# Patient Record
Sex: Male | Born: 1935
Health system: Southern US, Community
[De-identification: ages and names within clinical notes are randomized; demographics above are authoritative.]

## PROBLEM LIST (undated history)

## (undated) DIAGNOSIS — S9304XA Dislocation of right ankle joint, initial encounter: Secondary | ICD-10-CM

## (undated) DIAGNOSIS — Z86010 Personal history of colonic polyps: Secondary | ICD-10-CM

## (undated) DIAGNOSIS — K219 Gastro-esophageal reflux disease without esophagitis: Secondary | ICD-10-CM

## (undated) DIAGNOSIS — H9190 Unspecified hearing loss, unspecified ear: Secondary | ICD-10-CM

## (undated) DIAGNOSIS — K922 Gastrointestinal hemorrhage, unspecified: Secondary | ICD-10-CM

## (undated) DIAGNOSIS — I2510 Atherosclerotic heart disease of native coronary artery without angina pectoris: Secondary | ICD-10-CM

## (undated) DIAGNOSIS — C8330 Diffuse large B-cell lymphoma, unspecified site: Secondary | ICD-10-CM

## (undated) DIAGNOSIS — I10 Essential (primary) hypertension: Secondary | ICD-10-CM

## (undated) DIAGNOSIS — S82899A Other fracture of unspecified lower leg, initial encounter for closed fracture: Secondary | ICD-10-CM

## (undated) DIAGNOSIS — I519 Heart disease, unspecified: Secondary | ICD-10-CM

## (undated) DIAGNOSIS — A048 Other specified bacterial intestinal infections: Secondary | ICD-10-CM

## (undated) HISTORY — DX: Dislocation of right ankle joint, initial encounter: S93.04XA

## (undated) HISTORY — PX: CATARACT EXTRACTION: SUR2

## (undated) HISTORY — PX: EYE SURGERY: SHX253

## (undated) HISTORY — DX: Heart disease, unspecified: I51.9

## (undated) HISTORY — DX: Personal history of colonic polyps: Z86.010

## (undated) HISTORY — PX: ANGIOPLASTY: SHX39

## (undated) HISTORY — DX: Atherosclerotic heart disease of native coronary artery without angina pectoris: I25.10

## (undated) HISTORY — DX: Other specified bacterial intestinal infections: A04.8

## (undated) HISTORY — DX: Gastro-esophageal reflux disease without esophagitis: K21.9

## (undated) HISTORY — DX: Essential (primary) hypertension: I10

## (undated) HISTORY — DX: Other fracture of unspecified lower leg, initial encounter for closed fracture: S82.899A

## (undated) HISTORY — PX: OTHER SURGICAL HISTORY: SHX169

## (undated) HISTORY — PX: CORONARY ANGIOPLASTY: SHX604

## (undated) HISTORY — DX: Gastrointestinal hemorrhage, unspecified: K92.2

---

## 2003-08-30 ENCOUNTER — Other Ambulatory Visit: Payer: Self-pay

## 2004-09-04 ENCOUNTER — Ambulatory Visit: Payer: Self-pay | Admitting: Family Medicine

## 2005-05-07 ENCOUNTER — Ambulatory Visit: Payer: Self-pay | Admitting: Unknown Physician Specialty

## 2005-06-08 ENCOUNTER — Ambulatory Visit: Payer: Self-pay | Admitting: Family Medicine

## 2006-08-29 ENCOUNTER — Ambulatory Visit: Payer: Self-pay | Admitting: Unknown Physician Specialty

## 2006-09-05 ENCOUNTER — Ambulatory Visit: Payer: Self-pay | Admitting: Unknown Physician Specialty

## 2006-09-27 ENCOUNTER — Ambulatory Visit: Payer: Self-pay | Admitting: Unknown Physician Specialty

## 2007-10-16 ENCOUNTER — Emergency Department: Payer: Self-pay | Admitting: Emergency Medicine

## 2007-10-16 ENCOUNTER — Other Ambulatory Visit: Payer: Self-pay

## 2007-10-25 ENCOUNTER — Ambulatory Visit: Payer: Self-pay | Admitting: Unknown Physician Specialty

## 2009-03-19 ENCOUNTER — Emergency Department: Payer: Self-pay | Admitting: Emergency Medicine

## 2010-06-16 ENCOUNTER — Ambulatory Visit: Payer: Self-pay | Admitting: Family Medicine

## 2011-08-08 ENCOUNTER — Inpatient Hospital Stay: Payer: Self-pay | Admitting: Internal Medicine

## 2011-08-26 DIAGNOSIS — I2510 Atherosclerotic heart disease of native coronary artery without angina pectoris: Secondary | ICD-10-CM | POA: Insufficient documentation

## 2011-08-26 DIAGNOSIS — E785 Hyperlipidemia, unspecified: Secondary | ICD-10-CM | POA: Insufficient documentation

## 2011-08-26 DIAGNOSIS — K922 Gastrointestinal hemorrhage, unspecified: Secondary | ICD-10-CM | POA: Insufficient documentation

## 2011-08-26 DIAGNOSIS — G629 Polyneuropathy, unspecified: Secondary | ICD-10-CM | POA: Insufficient documentation

## 2011-08-26 DIAGNOSIS — I10 Essential (primary) hypertension: Secondary | ICD-10-CM | POA: Insufficient documentation

## 2011-08-26 DIAGNOSIS — N400 Enlarged prostate without lower urinary tract symptoms: Secondary | ICD-10-CM | POA: Insufficient documentation

## 2011-08-26 DIAGNOSIS — Z720 Tobacco use: Secondary | ICD-10-CM | POA: Insufficient documentation

## 2011-08-26 HISTORY — DX: Atherosclerotic heart disease of native coronary artery without angina pectoris: I25.10

## 2011-08-26 HISTORY — DX: Gastrointestinal hemorrhage, unspecified: K92.2

## 2011-08-26 HISTORY — DX: Essential (primary) hypertension: I10

## 2011-09-01 ENCOUNTER — Encounter: Payer: Self-pay | Admitting: Cardiovascular Disease

## 2011-09-13 ENCOUNTER — Ambulatory Visit: Payer: Self-pay | Admitting: Ophthalmology

## 2011-09-13 LAB — POTASSIUM: Potassium: 4.5 mmol/L (ref 3.5–5.1)

## 2011-09-20 ENCOUNTER — Ambulatory Visit: Payer: Self-pay | Admitting: Ophthalmology

## 2011-09-22 ENCOUNTER — Encounter: Payer: Self-pay | Admitting: Physician Assistant

## 2011-09-24 ENCOUNTER — Encounter: Payer: Self-pay | Admitting: Cardiovascular Disease

## 2011-09-24 ENCOUNTER — Encounter: Payer: Self-pay | Admitting: Physician Assistant

## 2011-10-22 ENCOUNTER — Encounter: Payer: Self-pay | Admitting: Physician Assistant

## 2011-10-22 ENCOUNTER — Encounter: Payer: Self-pay | Admitting: Cardiovascular Disease

## 2011-12-09 DIAGNOSIS — R0602 Shortness of breath: Secondary | ICD-10-CM | POA: Insufficient documentation

## 2011-12-10 ENCOUNTER — Ambulatory Visit: Payer: Self-pay | Admitting: Physician Assistant

## 2012-06-14 ENCOUNTER — Ambulatory Visit: Payer: Self-pay | Admitting: Ophthalmology

## 2012-06-14 LAB — PROTIME-INR: INR: 1

## 2012-06-26 ENCOUNTER — Ambulatory Visit: Payer: Self-pay | Admitting: Ophthalmology

## 2012-08-03 DIAGNOSIS — N401 Enlarged prostate with lower urinary tract symptoms: Secondary | ICD-10-CM

## 2012-08-03 DIAGNOSIS — N138 Other obstructive and reflux uropathy: Secondary | ICD-10-CM | POA: Insufficient documentation

## 2012-08-03 DIAGNOSIS — N529 Male erectile dysfunction, unspecified: Secondary | ICD-10-CM | POA: Insufficient documentation

## 2012-08-03 DIAGNOSIS — R339 Retention of urine, unspecified: Secondary | ICD-10-CM | POA: Insufficient documentation

## 2012-09-29 LAB — COMPREHENSIVE METABOLIC PANEL
Albumin: 3.9 g/dL (ref 3.4–5.0)
BUN: 24 mg/dL — ABNORMAL HIGH (ref 7–18)
Bilirubin,Total: 0.3 mg/dL (ref 0.2–1.0)
Calcium, Total: 8.6 mg/dL (ref 8.5–10.1)
Chloride: 104 mmol/L (ref 98–107)
Co2: 30 mmol/L (ref 21–32)
Creatinine: 1.28 mg/dL (ref 0.60–1.30)
EGFR (Non-African Amer.): 53 — ABNORMAL LOW
Glucose: 107 mg/dL — ABNORMAL HIGH (ref 65–99)
SGOT(AST): 19 U/L (ref 15–37)
SGPT (ALT): 23 U/L (ref 12–78)

## 2012-09-29 LAB — CBC
HCT: 39.5 % — ABNORMAL LOW (ref 40.0–52.0)
HGB: 12.8 g/dL — ABNORMAL LOW (ref 13.0–18.0)
MCH: 29.1 pg (ref 26.0–34.0)
MCV: 89 fL (ref 80–100)
Platelet: 198 10*3/uL (ref 150–440)
RBC: 4.41 10*6/uL (ref 4.40–5.90)
RDW: 15.6 % — ABNORMAL HIGH (ref 11.5–14.5)
WBC: 4.9 10*3/uL (ref 3.8–10.6)

## 2012-09-29 LAB — CK TOTAL AND CKMB (NOT AT ARMC): CK-MB: 0.7 ng/mL (ref 0.5–3.6)

## 2012-09-29 LAB — TROPONIN I: Troponin-I: <0.02 ng/mL

## 2012-09-30 ENCOUNTER — Observation Stay: Payer: Self-pay | Admitting: Internal Medicine

## 2012-09-30 DIAGNOSIS — R079 Chest pain, unspecified: Secondary | ICD-10-CM

## 2012-10-02 LAB — CBC WITH DIFFERENTIAL/PLATELET
Eosinophil #: 0.4 10*3/uL (ref 0.0–0.7)
HCT: 41.3 % (ref 40.0–52.0)
HGB: 13.7 g/dL (ref 13.0–18.0)
Lymphocyte #: 1 10*3/uL (ref 1.0–3.6)
Lymphocyte %: 17.8 %
MCV: 89 fL (ref 80–100)
Monocyte #: 0.6 x10 3/mm (ref 0.2–1.0)
Monocyte %: 12 %
Platelet: 225 10*3/uL (ref 150–440)
RDW: 15.2 % — ABNORMAL HIGH (ref 11.5–14.5)
WBC: 5.4 10*3/uL (ref 3.8–10.6)

## 2012-10-02 LAB — BASIC METABOLIC PANEL
Anion Gap: 8 (ref 7–16)
Calcium, Total: 8.2 mg/dL — ABNORMAL LOW (ref 8.5–10.1)
Chloride: 105 mmol/L (ref 98–107)
Creatinine: 1.08 mg/dL (ref 0.60–1.30)
EGFR (African American): >60
EGFR (Non-African Amer.): >60
Osmolality: 278 (ref 275–301)
Potassium: 3.7 mmol/L (ref 3.5–5.1)
Sodium: 138 mmol/L (ref 136–145)

## 2012-10-03 LAB — CLOSTRIDIUM DIFFICILE BY PCR

## 2013-05-03 ENCOUNTER — Ambulatory Visit: Payer: Self-pay | Admitting: Unknown Physician Specialty

## 2013-05-03 LAB — HM COLONOSCOPY

## 2013-05-04 LAB — PATHOLOGY REPORT

## 2013-08-23 DIAGNOSIS — C8330 Diffuse large B-cell lymphoma, unspecified site: Secondary | ICD-10-CM

## 2013-08-23 HISTORY — DX: Diffuse large B-cell lymphoma, unspecified site: C83.30

## 2014-02-12 LAB — BASIC METABOLIC PANEL
BUN: 21 mg/dL (ref 4–21)
Creatinine: 1.1 mg/dL (ref 0.6–1.3)
GLUCOSE: 91 mg/dL
Potassium: 4.8 mmol/L (ref 3.4–5.3)
Sodium: 140 mmol/L (ref 137–147)

## 2014-02-12 LAB — HEPATIC FUNCTION PANEL
ALT: 13 U/L (ref 10–40)
AST: 12 U/L — AB (ref 14–40)

## 2014-02-12 LAB — CBC AND DIFFERENTIAL
HCT: 322 % — AB (ref 41–53)
HEMOGLOBIN: 11.6 g/dL — AB (ref 13.5–17.5)
PLATELETS: 322 10*3/uL (ref 150–399)
WBC: 7.8 10*3/mL

## 2014-02-14 ENCOUNTER — Ambulatory Visit: Payer: Self-pay | Admitting: Family Medicine

## 2014-02-19 DIAGNOSIS — R591 Generalized enlarged lymph nodes: Secondary | ICD-10-CM | POA: Insufficient documentation

## 2014-02-19 DIAGNOSIS — R599 Enlarged lymph nodes, unspecified: Secondary | ICD-10-CM | POA: Insufficient documentation

## 2014-02-20 ENCOUNTER — Ambulatory Visit: Payer: Self-pay | Admitting: Oncology

## 2014-02-21 ENCOUNTER — Ambulatory Visit: Payer: Self-pay | Admitting: Oncology

## 2014-02-27 LAB — COMPREHENSIVE METABOLIC PANEL
ALBUMIN: 3.5 g/dL (ref 3.4–5.0)
ALK PHOS: 111 U/L
ALT: 19 U/L (ref 12–78)
AST: 17 U/L (ref 15–37)
Anion Gap: 7 (ref 7–16)
BUN: 17 mg/dL (ref 7–18)
Bilirubin,Total: 0.3 mg/dL (ref 0.2–1.0)
CREATININE: 1.19 mg/dL (ref 0.60–1.30)
Calcium, Total: 9.1 mg/dL (ref 8.5–10.1)
Chloride: 103 mmol/L (ref 98–107)
Co2: 32 mmol/L (ref 21–32)
EGFR (Non-African Amer.): 57 — ABNORMAL LOW
GLUCOSE: 97 mg/dL (ref 65–99)
Osmolality: 285 (ref 275–301)
Potassium: 4.5 mmol/L (ref 3.5–5.1)
Sodium: 142 mmol/L (ref 136–145)
Total Protein: 7.5 g/dL (ref 6.4–8.2)

## 2014-02-27 LAB — CBC CANCER CENTER
BASOS ABS: 0 x10 3/mm (ref 0.0–0.1)
Basophil %: 0.5 %
EOS ABS: 0.5 x10 3/mm (ref 0.0–0.7)
Eosinophil %: 6.2 %
HCT: 35 % — ABNORMAL LOW (ref 40.0–52.0)
HGB: 11.3 g/dL — ABNORMAL LOW (ref 13.0–18.0)
LYMPHS PCT: 15.2 %
Lymphocyte #: 1.2 x10 3/mm (ref 1.0–3.6)
MCH: 27.3 pg (ref 26.0–34.0)
MCHC: 32.3 g/dL (ref 32.0–36.0)
MCV: 85 fL (ref 80–100)
Monocyte #: 0.8 x10 3/mm (ref 0.2–1.0)
Monocyte %: 9.6 %
Neutrophil #: 5.4 x10 3/mm (ref 1.4–6.5)
Neutrophil %: 68.5 %
Platelet: 286 x10 3/mm (ref 150–440)
RBC: 4.13 10*6/uL — AB (ref 4.40–5.90)
RDW: 15.7 % — ABNORMAL HIGH (ref 11.5–14.5)
WBC: 7.9 x10 3/mm (ref 3.8–10.6)

## 2014-02-27 LAB — LACTATE DEHYDROGENASE: LDH: 190 U/L (ref 85–241)

## 2014-02-28 LAB — PSA: PSA: 0.9 ng/mL (ref 0.0–4.0)

## 2014-03-11 ENCOUNTER — Ambulatory Visit: Payer: Self-pay | Admitting: Oncology

## 2014-03-11 LAB — PROTIME-INR
INR: 1
PROTHROMBIN TIME: 13 s (ref 11.5–14.7)

## 2014-03-11 LAB — CBC WITH DIFFERENTIAL/PLATELET
BASOS ABS: 0.1 10*3/uL (ref 0.0–0.1)
Basophil %: 0.7 %
EOS PCT: 7 %
Eosinophil #: 0.5 10*3/uL (ref 0.0–0.7)
HCT: 35.9 % — AB (ref 40.0–52.0)
HGB: 11.5 g/dL — ABNORMAL LOW (ref 13.0–18.0)
LYMPHS ABS: 1.2 10*3/uL (ref 1.0–3.6)
LYMPHS PCT: 16.1 %
MCH: 27.1 pg (ref 26.0–34.0)
MCHC: 31.9 g/dL — ABNORMAL LOW (ref 32.0–36.0)
MCV: 85 fL (ref 80–100)
MONOS PCT: 8.7 %
Monocyte #: 0.7 x10 3/mm (ref 0.2–1.0)
Neutrophil #: 5.1 10*3/uL (ref 1.4–6.5)
Neutrophil %: 67.5 %
PLATELETS: 269 10*3/uL (ref 150–440)
RBC: 4.24 10*6/uL — ABNORMAL LOW (ref 4.40–5.90)
RDW: 16.1 % — ABNORMAL HIGH (ref 11.5–14.5)
WBC: 7.6 10*3/uL (ref 3.8–10.6)

## 2014-03-13 LAB — PATHOLOGY REPORT

## 2014-03-23 ENCOUNTER — Ambulatory Visit: Payer: Self-pay | Admitting: Oncology

## 2014-03-28 ENCOUNTER — Ambulatory Visit: Payer: Self-pay | Admitting: Internal Medicine

## 2014-04-10 ENCOUNTER — Ambulatory Visit (INDEPENDENT_AMBULATORY_CARE_PROVIDER_SITE_OTHER): Payer: Medicare Other | Admitting: General Surgery

## 2014-04-10 ENCOUNTER — Encounter: Payer: Self-pay | Admitting: General Surgery

## 2014-04-10 VITALS — BP 122/70 | HR 78 | Resp 14 | Ht 73.0 in | Wt 248.0 lb

## 2014-04-10 DIAGNOSIS — C8589 Other specified types of non-Hodgkin lymphoma, extranodal and solid organ sites: Secondary | ICD-10-CM

## 2014-04-10 DIAGNOSIS — C8330 Diffuse large B-cell lymphoma, unspecified site: Secondary | ICD-10-CM | POA: Insufficient documentation

## 2014-04-10 DIAGNOSIS — C8590 Non-Hodgkin lymphoma, unspecified, unspecified site: Secondary | ICD-10-CM | POA: Insufficient documentation

## 2014-04-10 LAB — PATHOLOGY REPORT

## 2014-04-10 NOTE — Patient Instructions (Addendum)

## 2014-04-10 NOTE — Progress Notes (Signed)
Patient ID: Bryan Nelson, male   DOB: 1932-03-30, 80 y.o.   MRN: [id number]  Chief Complaint  Patient presents with  . Other    Port Placement    HPI Bryan Nelson is a 80 y.o. male here for evaluation for a port placement for treatment of Lymphoma referred here by Dr Oliva Bustard. This was found on CT scan that was done due to pain in the right lower abdomen. He reports that this pain had occurred for about two weeks prior to this.   HPI Pt underwent endoscopic US guided biopsy.  Past Medical History  Diagnosis Date  . Heart disease   . GERD (gastroesophageal reflux disease)   . Hyperlipidemia   . Arthritis   . Hearing loss   . Cancer 2015    Lymphoma    Past Surgical History  Procedure Laterality Date  . Heart stent placement      1998, 2000, 2012    History reviewed. No pertinent family history.  Social History History  Substance Use Topics  . Smoking status: Former Smoker -- 12 years    Quit date: 08/23/1981  . Smokeless tobacco: Never Used  . Alcohol Use: No    No Known Allergies  Current Outpatient Prescriptions  Medication Sig Dispense Refill  . aspirin 81 MG tablet Take 81 mg by mouth daily.      Marland Kitchen atorvastatin (LIPITOR) 10 MG tablet Take 10 mg by mouth daily.      . clopidogrel (PLAVIX) 75 MG tablet Take 75 mg by mouth once.       . finasteride (PROSCAR) 5 MG tablet Take 5 mg by mouth daily.      . hydrochlorothiazide (HYDRODIURIL) 25 MG tablet Take 25 mg by mouth daily.      . metoprolol (LOPRESSOR) 100 MG tablet Take 100 mg by mouth 2 (two) times daily.      Marland Kitchen omeprazole (PRILOSEC) 40 MG capsule Take 40 mg by mouth 2 (two) times daily.      . ramipril (ALTACE) 2.5 MG capsule Take 2.5 mg by mouth daily.      . sucralfate (CARAFATE) 1 G tablet Take 1 g by mouth 2 (two) times daily.      . tamsulosin (FLOMAX) 0.4 MG CAPS capsule Take 0.4 mg by mouth daily.       No current facility-administered medications for this visit.    Review of Systems Review of  Systems  Constitutional: Negative.   Respiratory: Negative.   Cardiovascular: Negative.     Blood pressure 122/70, pulse 78, resp. rate 14, height 6\' 1"  (1.854 m), weight 248 lb (112.492 kg).  Physical Exam Physical Exam  Constitutional: He is oriented to person, place, and time. He appears well-developed and well-nourished.  Eyes: Conjunctivae are normal. No scleral icterus.  Neck: Neck supple.  Cardiovascular: Normal rate, regular rhythm and normal heart sounds.   Pulmonary/Chest: Effort normal and breath sounds normal.  Abdominal: Soft. There is no hepatosplenomegaly. There is no tenderness.  Patient does have diastasis of the recti   Lymphadenopathy:    He has no cervical adenopathy.    He has no axillary adenopathy.  Neurological: He is alert and oriented to person, place, and time.    Data Reviewed Dr. Metro Kung notes.  Assessment    Lymphoma.     Plan    Advised nfully on port placem,ent, risks and benefits. He is agreeable        SANKAR,SEEPLAPUTHUR G 04/10/2014, 2:00 PM

## 2014-04-16 ENCOUNTER — Telehealth: Payer: Self-pay

## 2014-04-16 ENCOUNTER — Ambulatory Visit: Payer: Self-pay | Admitting: Oncology

## 2014-04-16 NOTE — Telephone Encounter (Signed)
Pre admission testing called to say that the patient missed his appointment today. He has been rescheduled for 04/17/14 at 1:00 pm. Patient is aware of date, time, and instructions.

## 2014-04-17 ENCOUNTER — Ambulatory Visit: Payer: Self-pay | Admitting: General Surgery

## 2014-04-23 ENCOUNTER — Encounter: Payer: Self-pay | Admitting: General Surgery

## 2014-04-23 ENCOUNTER — Ambulatory Visit: Payer: Self-pay | Admitting: Oncology

## 2014-04-23 ENCOUNTER — Ambulatory Visit: Payer: Self-pay | Admitting: General Surgery

## 2014-04-23 DIAGNOSIS — C8589 Other specified types of non-Hodgkin lymphoma, extranodal and solid organ sites: Secondary | ICD-10-CM

## 2014-04-24 ENCOUNTER — Encounter: Payer: Self-pay | Admitting: General Surgery

## 2014-04-24 LAB — CBC CANCER CENTER
Basophil #: 0 "x10 3/mm "
Basophil %: 0.3 %
Eosinophil #: 0.5 "x10 3/mm "
Eosinophil %: 8.6 %
HCT: 36.5 % — ABNORMAL LOW
HGB: 11.6 g/dL — ABNORMAL LOW
Lymphocyte %: 14.6 %
Lymphs Abs: 0.8 "x10 3/mm " — ABNORMAL LOW
MCH: 27 pg
MCHC: 31.7 g/dL — ABNORMAL LOW
MCV: 85 fL
Monocyte #: 0.7 "x10 3/mm "
Monocyte %: 13.2 %
Neutrophil #: 3.4 "x10 3/mm "
Neutrophil %: 63.3 %
Platelet: 234 "x10 3/mm "
RBC: 4.29 "x10 6/mm " — ABNORMAL LOW
RDW: 17.2 % — ABNORMAL HIGH
WBC: 5.4 "x10 3/mm "

## 2014-04-24 LAB — COMPREHENSIVE METABOLIC PANEL WITH GFR
Albumin: 3.4 g/dL
Alkaline Phosphatase: 80 U/L
Anion Gap: 9
BUN: 26 mg/dL — ABNORMAL HIGH
Bilirubin,Total: 0.3 mg/dL
Calcium, Total: 7.5 mg/dL — ABNORMAL LOW
Chloride: 104 mmol/L
Co2: 27 mmol/L
Creatinine: 1.42 mg/dL — ABNORMAL HIGH
EGFR (African American): 54 — ABNORMAL LOW
EGFR (Non-African Amer.): 46 — ABNORMAL LOW
Glucose: 93 mg/dL
Osmolality: 284
Potassium: 3.8 mmol/L
SGOT(AST): 16 U/L
SGPT (ALT): 20 U/L
Sodium: 140 mmol/L
Total Protein: 7.1 g/dL

## 2014-04-24 LAB — LACTATE DEHYDROGENASE: LDH: 200 U/L

## 2014-04-30 LAB — CBC CANCER CENTER
BASOS ABS: 0 x10 3/mm (ref 0.0–0.1)
Basophil %: 0.2 %
EOS PCT: 7 %
Eosinophil #: 0.4 x10 3/mm (ref 0.0–0.7)
HCT: 38 % — ABNORMAL LOW (ref 40.0–52.0)
HGB: 12.2 g/dL — AB (ref 13.0–18.0)
Lymphocyte #: 0.6 x10 3/mm — ABNORMAL LOW (ref 1.0–3.6)
Lymphocyte %: 10.3 %
MCH: 27.3 pg (ref 26.0–34.0)
MCHC: 32.1 g/dL (ref 32.0–36.0)
MCV: 85 fL (ref 80–100)
MONO ABS: 0.1 x10 3/mm — AB (ref 0.2–1.0)
Monocyte %: 2.5 %
NEUTROS ABS: 4.8 x10 3/mm (ref 1.4–6.5)
Neutrophil %: 80 %
PLATELETS: 197 x10 3/mm (ref 150–440)
RBC: 4.47 10*6/uL (ref 4.40–5.90)
RDW: 17.2 % — ABNORMAL HIGH (ref 11.5–14.5)
WBC: 5.9 x10 3/mm (ref 3.8–10.6)

## 2014-04-30 LAB — COMPREHENSIVE METABOLIC PANEL
ALT: 17 U/L
AST: 8 U/L — AB (ref 15–37)
Albumin: 3.1 g/dL — ABNORMAL LOW (ref 3.4–5.0)
Alkaline Phosphatase: 80 U/L
Anion Gap: 6 — ABNORMAL LOW (ref 7–16)
BUN: 25 mg/dL — AB (ref 7–18)
Bilirubin,Total: 0.5 mg/dL (ref 0.2–1.0)
CHLORIDE: 100 mmol/L (ref 98–107)
CREATININE: 1.11 mg/dL (ref 0.60–1.30)
Calcium, Total: 7.3 mg/dL — ABNORMAL LOW (ref 8.5–10.1)
Co2: 30 mmol/L (ref 21–32)
EGFR (Non-African Amer.): >60
Glucose: 102 mg/dL — ABNORMAL HIGH (ref 65–99)
OSMOLALITY: 277 (ref 275–301)
POTASSIUM: 3.6 mmol/L (ref 3.5–5.1)
SODIUM: 136 mmol/L (ref 136–145)
Total Protein: 6.1 g/dL — ABNORMAL LOW (ref 6.4–8.2)

## 2014-05-06 ENCOUNTER — Ambulatory Visit (INDEPENDENT_AMBULATORY_CARE_PROVIDER_SITE_OTHER): Payer: Self-pay | Admitting: General Surgery

## 2014-05-06 ENCOUNTER — Encounter: Payer: Self-pay | Admitting: General Surgery

## 2014-05-06 VITALS — BP 158/80 | HR 66 | Resp 12 | Ht 73.0 in | Wt 243.0 lb

## 2014-05-06 DIAGNOSIS — C8589 Other specified types of non-Hodgkin lymphoma, extranodal and solid organ sites: Secondary | ICD-10-CM

## 2014-05-06 DIAGNOSIS — C8590 Non-Hodgkin lymphoma, unspecified, unspecified site: Secondary | ICD-10-CM

## 2014-05-06 LAB — CBC CANCER CENTER
BASOS ABS: 0.1 x10 3/mm (ref 0.0–0.1)
BASOS PCT: 1.2 %
EOS ABS: 0.2 x10 3/mm (ref 0.0–0.7)
EOS PCT: 2.1 %
HCT: 33.7 % — ABNORMAL LOW (ref 40.0–52.0)
HGB: 10.9 g/dL — AB (ref 13.0–18.0)
LYMPHS ABS: 0.8 x10 3/mm — AB (ref 1.0–3.6)
LYMPHS PCT: 9.7 %
MCH: 27.4 pg (ref 26.0–34.0)
MCHC: 32.3 g/dL (ref 32.0–36.0)
MCV: 85 fL (ref 80–100)
MONO ABS: 0.9 x10 3/mm (ref 0.2–1.0)
Monocyte %: 10.5 %
Neutrophil #: 6.4 x10 3/mm (ref 1.4–6.5)
Neutrophil %: 76.5 %
Platelet: 224 x10 3/mm (ref 150–440)
RBC: 3.97 10*6/uL — ABNORMAL LOW (ref 4.40–5.90)
RDW: 16.7 % — ABNORMAL HIGH (ref 11.5–14.5)
WBC: 8.4 x10 3/mm (ref 3.8–10.6)

## 2014-05-06 NOTE — Progress Notes (Signed)
This is a 80 year old male here today for his post op port a cath placement on 04/23/14.Marland Kitchen Patient states the port site is doing well. Port site looks clean and healing well. Lungs are clear and no sign of infection.

## 2014-05-06 NOTE — Patient Instructions (Signed)
Patient to return as needed. 

## 2014-05-11 ENCOUNTER — Emergency Department: Payer: Self-pay | Admitting: Emergency Medicine

## 2014-05-11 LAB — URINALYSIS, COMPLETE
Bilirubin,UR: NEGATIVE
GLUCOSE, UR: NEGATIVE mg/dL (ref 0–75)
KETONE: NEGATIVE
Leukocyte Esterase: NEGATIVE
NITRITE: NEGATIVE
PH: 5 (ref 4.5–8.0)
Protein: NEGATIVE
RBC,UR: 153 /HPF (ref 0–5)
Specific Gravity: 1.016 (ref 1.003–1.030)
Squamous Epithelial: <1
WBC UR: 3 /HPF (ref 0–5)

## 2014-05-13 ENCOUNTER — Emergency Department: Payer: Self-pay | Admitting: Emergency Medicine

## 2014-05-16 LAB — CBC CANCER CENTER
Basophil #: 0.1 x10 3/mm (ref 0.0–0.1)
Basophil %: 1.4 %
Eosinophil #: 0.1 x10 3/mm (ref 0.0–0.7)
Eosinophil %: 2.4 %
HCT: 35.4 % — AB (ref 40.0–52.0)
HGB: 11.5 g/dL — AB (ref 13.0–18.0)
LYMPHS ABS: 0.7 x10 3/mm — AB (ref 1.0–3.6)
Lymphocyte %: 17.2 %
MCH: 27.6 pg (ref 26.0–34.0)
MCHC: 32.4 g/dL (ref 32.0–36.0)
MCV: 85 fL (ref 80–100)
Monocyte #: 0.6 x10 3/mm (ref 0.2–1.0)
Monocyte %: 13 %
NEUTROS ABS: 2.8 x10 3/mm (ref 1.4–6.5)
Neutrophil %: 66 %
Platelet: 318 x10 3/mm (ref 150–440)
RBC: 4.15 10*6/uL — ABNORMAL LOW (ref 4.40–5.90)
RDW: 17.4 % — ABNORMAL HIGH (ref 11.5–14.5)
WBC: 4.3 x10 3/mm (ref 3.8–10.6)

## 2014-05-16 LAB — COMPREHENSIVE METABOLIC PANEL
ALBUMIN: 3.5 g/dL (ref 3.4–5.0)
ALT: 25 U/L
ANION GAP: 7 (ref 7–16)
AST: 19 U/L (ref 15–37)
Alkaline Phosphatase: 76 U/L
BILIRUBIN TOTAL: 0.2 mg/dL (ref 0.2–1.0)
BUN: 17 mg/dL (ref 7–18)
CO2: 30 mmol/L (ref 21–32)
Calcium, Total: 8.4 mg/dL — ABNORMAL LOW (ref 8.5–10.1)
Chloride: 104 mmol/L (ref 98–107)
Creatinine: 1.02 mg/dL (ref 0.60–1.30)
EGFR (African American): >60
EGFR (Non-African Amer.): >60
Glucose: 114 mg/dL — ABNORMAL HIGH (ref 65–99)
OSMOLALITY: 284 (ref 275–301)
Potassium: 4 mmol/L (ref 3.5–5.1)
Sodium: 141 mmol/L (ref 136–145)
Total Protein: 6.6 g/dL (ref 6.4–8.2)

## 2014-05-23 ENCOUNTER — Ambulatory Visit: Payer: Self-pay | Admitting: Oncology

## 2014-05-23 LAB — URINALYSIS, COMPLETE
Bacteria: NONE SEEN
Bilirubin,UR: NEGATIVE
Glucose,UR: NEGATIVE mg/dL (ref 0–75)
Ketone: NEGATIVE
Leukocyte Esterase: NEGATIVE
NITRITE: NEGATIVE
Ph: 6 (ref 4.5–8.0)
Protein: NEGATIVE
RBC,UR: 21 /HPF (ref 0–5)
Specific Gravity: 1.019 (ref 1.003–1.030)
Squamous Epithelial: NONE SEEN
Transitional Epi: <1
WBC UR: 5 /HPF (ref 0–5)

## 2014-05-23 LAB — CBC CANCER CENTER
BASOS ABS: 1 %
Bands: 4 %
Comment - H1-Com2: NORMAL
Eosinophil: 3 %
HCT: 34.1 % — ABNORMAL LOW (ref 40.0–52.0)
HGB: 11.2 g/dL — ABNORMAL LOW (ref 13.0–18.0)
Lymphocytes: 18 %
MCH: 28.5 pg (ref 26.0–34.0)
MCHC: 33 g/dL (ref 32.0–36.0)
MCV: 86 fL (ref 80–100)
MONOS PCT: 4 %
Metamyelocyte: 4 %
Platelet: 196 x10 3/mm (ref 150–440)
RBC: 3.94 10*6/uL — ABNORMAL LOW (ref 4.40–5.90)
RDW: 19 % — ABNORMAL HIGH (ref 11.5–14.5)
SEGMENTED NEUTROPHILS: 66 %
WBC: 4.1 x10 3/mm (ref 3.8–10.6)

## 2014-05-25 LAB — URINE CULTURE

## 2014-05-27 ENCOUNTER — Ambulatory Visit: Payer: Self-pay | Admitting: Urology

## 2014-05-27 LAB — CBC WITH DIFFERENTIAL/PLATELET
Bands: 12 %
COMMENT - H1-COM2: NORMAL
Eosinophil: 2 %
HCT: 34 % — AB (ref 40.0–52.0)
HGB: 10.8 g/dL — AB (ref 13.0–18.0)
Lymphocytes: 14 %
MCH: 27.7 pg (ref 26.0–34.0)
MCHC: 31.7 g/dL — AB (ref 32.0–36.0)
MCV: 87 fL (ref 80–100)
MONOS PCT: 11 %
MYELOCYTE: 1 %
Metamyelocyte: 1 %
Platelet: 171 10*3/uL (ref 150–440)
RBC: 3.89 10*6/uL — ABNORMAL LOW (ref 4.40–5.90)
RDW: 19.5 % — ABNORMAL HIGH (ref 11.5–14.5)
Segmented Neutrophils: 59 %
WBC: 5.7 10*3/uL (ref 3.8–10.6)

## 2014-05-27 LAB — APTT: ACTIVATED PTT: 30.9 s (ref 23.6–35.9)

## 2014-05-27 LAB — PROTIME-INR
INR: 0.9
PROTHROMBIN TIME: 12.4 s (ref 11.5–14.7)

## 2014-05-30 LAB — CBC CANCER CENTER
BASOS ABS: 0.1 x10 3/mm (ref 0.0–0.1)
Basophil %: 1.1 %
EOS ABS: 0.1 x10 3/mm (ref 0.0–0.7)
EOS PCT: 1.5 %
HCT: 34.8 % — ABNORMAL LOW (ref 40.0–52.0)
HGB: 11.1 g/dL — AB (ref 13.0–18.0)
LYMPHS ABS: 0.7 x10 3/mm — AB (ref 1.0–3.6)
Lymphocyte %: 10.8 %
MCH: 27.7 pg (ref 26.0–34.0)
MCHC: 31.8 g/dL — AB (ref 32.0–36.0)
MCV: 87 fL (ref 80–100)
MONO ABS: 0.7 x10 3/mm (ref 0.2–1.0)
MONOS PCT: 11 %
NEUTROS ABS: 4.9 x10 3/mm (ref 1.4–6.5)
Neutrophil %: 75.6 %
PLATELETS: 212 x10 3/mm (ref 150–440)
RBC: 3.99 10*6/uL — ABNORMAL LOW (ref 4.40–5.90)
RDW: 19.8 % — AB (ref 11.5–14.5)
WBC: 6.4 x10 3/mm (ref 3.8–10.6)

## 2014-05-30 LAB — URINALYSIS, COMPLETE
Bilirubin,UR: NEGATIVE
GLUCOSE, UR: NEGATIVE mg/dL (ref 0–75)
Hyaline Cast: 4
Ketone: NEGATIVE
NITRITE: POSITIVE
Ph: 6 (ref 4.5–8.0)
Protein: 100
SQUAMOUS EPITHELIAL: NONE SEEN
Specific Gravity: 1.017 (ref 1.003–1.030)
WBC UR: 26 /HPF (ref 0–5)

## 2014-06-02 LAB — URINE CULTURE

## 2014-06-06 LAB — CBC CANCER CENTER
BASOS ABS: 0 x10 3/mm (ref 0.0–0.1)
Basophil %: 0.2 %
Eosinophil #: 0.1 x10 3/mm (ref 0.0–0.7)
Eosinophil %: 2.4 %
HCT: 33.3 % — AB (ref 40.0–52.0)
HGB: 11 g/dL — ABNORMAL LOW (ref 13.0–18.0)
Lymphocyte #: 0.9 x10 3/mm — ABNORMAL LOW (ref 1.0–3.6)
Lymphocyte %: 18.2 %
MCH: 28.7 pg (ref 26.0–34.0)
MCHC: 33 g/dL (ref 32.0–36.0)
MCV: 87 fL (ref 80–100)
MONOS PCT: 17.6 %
Monocyte #: 0.9 x10 3/mm (ref 0.2–1.0)
NEUTROS ABS: 3.2 x10 3/mm (ref 1.4–6.5)
Neutrophil %: 61.6 %
Platelet: 345 x10 3/mm (ref 150–440)
RBC: 3.84 10*6/uL — ABNORMAL LOW (ref 4.40–5.90)
RDW: 19.6 % — ABNORMAL HIGH (ref 11.5–14.5)
WBC: 5.1 x10 3/mm (ref 3.8–10.6)

## 2014-06-06 LAB — COMPREHENSIVE METABOLIC PANEL
ALK PHOS: 69 U/L
ALT: 27 U/L
Albumin: 3.5 g/dL (ref 3.4–5.0)
Anion Gap: 5 — ABNORMAL LOW (ref 7–16)
BUN: 21 mg/dL — ABNORMAL HIGH (ref 7–18)
Bilirubin,Total: 0.3 mg/dL (ref 0.2–1.0)
CALCIUM: 8.4 mg/dL — AB (ref 8.5–10.1)
CO2: 31 mmol/L (ref 21–32)
Chloride: 102 mmol/L (ref 98–107)
Creatinine: 1.23 mg/dL (ref 0.60–1.30)
EGFR (African American): >60
EGFR (Non-African Amer.): >60
Glucose: 104 mg/dL — ABNORMAL HIGH (ref 65–99)
OSMOLALITY: 279 (ref 275–301)
POTASSIUM: 3.6 mmol/L (ref 3.5–5.1)
SGOT(AST): 17 U/L (ref 15–37)
SODIUM: 138 mmol/L (ref 136–145)
TOTAL PROTEIN: 6.4 g/dL (ref 6.4–8.2)

## 2014-06-10 ENCOUNTER — Emergency Department: Payer: Self-pay | Admitting: Emergency Medicine

## 2014-06-10 LAB — URINALYSIS, COMPLETE
BILIRUBIN, UR: NEGATIVE
Glucose,UR: NEGATIVE mg/dL (ref 0–75)
KETONE: NEGATIVE
Nitrite: POSITIVE
PH: 5 (ref 4.5–8.0)
Protein: 100
RBC,UR: 1193 /HPF (ref 0–5)
Specific Gravity: 1.019 (ref 1.003–1.030)
Squamous Epithelial: NONE SEEN

## 2014-06-13 LAB — CBC CANCER CENTER
BASOS ABS: 0 x10 3/mm (ref 0.0–0.1)
BASOS PCT: 1 %
EOS ABS: 0.1 x10 3/mm (ref 0.0–0.7)
Eosinophil %: 2.5 %
HCT: 32 % — AB (ref 40.0–52.0)
HGB: 10.3 g/dL — AB (ref 13.0–18.0)
Lymphocyte #: 0.9 x10 3/mm — ABNORMAL LOW (ref 1.0–3.6)
Lymphocyte %: 18.9 %
MCH: 28.7 pg (ref 26.0–34.0)
MCHC: 32.3 g/dL (ref 32.0–36.0)
MCV: 89 fL (ref 80–100)
Monocyte #: 0.4 x10 3/mm (ref 0.2–1.0)
Monocyte %: 8 %
Neutrophil #: 3.4 x10 3/mm (ref 1.4–6.5)
Neutrophil %: 69.6 %
PLATELETS: 203 x10 3/mm (ref 150–440)
RBC: 3.61 10*6/uL — ABNORMAL LOW (ref 4.40–5.90)
RDW: 20.5 % — ABNORMAL HIGH (ref 11.5–14.5)
WBC: 4.8 x10 3/mm (ref 3.8–10.6)

## 2014-06-20 LAB — CBC CANCER CENTER
BASOS ABS: 0.1 x10 3/mm (ref 0.0–0.1)
Basophil %: 0.9 %
EOS ABS: 0.1 x10 3/mm (ref 0.0–0.7)
Eosinophil %: 0.8 %
HCT: 31.8 % — ABNORMAL LOW (ref 40.0–52.0)
HGB: 10.3 g/dL — ABNORMAL LOW (ref 13.0–18.0)
LYMPHS ABS: 0.8 x10 3/mm — AB (ref 1.0–3.6)
Lymphocyte %: 10.1 %
MCH: 28.9 pg (ref 26.0–34.0)
MCHC: 32.4 g/dL (ref 32.0–36.0)
MCV: 89 fL (ref 80–100)
MONOS PCT: 11.9 %
Monocyte #: 1 x10 3/mm (ref 0.2–1.0)
Neutrophil #: 6.3 x10 3/mm (ref 1.4–6.5)
Neutrophil %: 76.3 %
PLATELETS: 256 x10 3/mm (ref 150–440)
RBC: 3.56 10*6/uL — AB (ref 4.40–5.90)
RDW: 20.5 % — ABNORMAL HIGH (ref 11.5–14.5)
WBC: 8.3 x10 3/mm (ref 3.8–10.6)

## 2014-06-23 ENCOUNTER — Ambulatory Visit: Payer: Self-pay | Admitting: Oncology

## 2014-06-27 LAB — COMPREHENSIVE METABOLIC PANEL
ALBUMIN: 3.6 g/dL (ref 3.4–5.0)
ANION GAP: 8 (ref 7–16)
Alkaline Phosphatase: 76 U/L
BUN: 15 mg/dL (ref 7–18)
Bilirubin,Total: 0.3 mg/dL (ref 0.2–1.0)
CO2: 28 mmol/L (ref 21–32)
Calcium, Total: 8.8 mg/dL (ref 8.5–10.1)
Chloride: 103 mmol/L (ref 98–107)
Creatinine: 1.09 mg/dL (ref 0.60–1.30)
EGFR (Non-African Amer.): >60
Glucose: 130 mg/dL — ABNORMAL HIGH (ref 65–99)
Osmolality: 280 (ref 275–301)
Potassium: 3.6 mmol/L (ref 3.5–5.1)
SGOT(AST): 13 U/L — ABNORMAL LOW (ref 15–37)
SGPT (ALT): 25 U/L
SODIUM: 139 mmol/L (ref 136–145)
Total Protein: 6.7 g/dL (ref 6.4–8.2)

## 2014-06-27 LAB — CBC CANCER CENTER
Basophil #: 0.1 x10 3/mm (ref 0.0–0.1)
Basophil %: 1.4 %
EOS ABS: 0 x10 3/mm (ref 0.0–0.7)
Eosinophil %: 0.7 %
HCT: 35 % — AB (ref 40.0–52.0)
HGB: 11.5 g/dL — AB (ref 13.0–18.0)
Lymphocyte #: 0.5 x10 3/mm — ABNORMAL LOW (ref 1.0–3.6)
Lymphocyte %: 10.7 %
MCH: 29.1 pg (ref 26.0–34.0)
MCHC: 32.9 g/dL (ref 32.0–36.0)
MCV: 89 fL (ref 80–100)
Monocyte #: 0.8 x10 3/mm (ref 0.2–1.0)
Monocyte %: 14.9 %
NEUTROS ABS: 3.7 x10 3/mm (ref 1.4–6.5)
NEUTROS PCT: 72.3 %
Platelet: 374 x10 3/mm (ref 150–440)
RBC: 3.96 10*6/uL — ABNORMAL LOW (ref 4.40–5.90)
RDW: 20.4 % — ABNORMAL HIGH (ref 11.5–14.5)
WBC: 5.1 x10 3/mm (ref 3.8–10.6)

## 2014-07-17 ENCOUNTER — Ambulatory Visit: Payer: Self-pay | Admitting: Oncology

## 2014-07-23 ENCOUNTER — Ambulatory Visit: Payer: Self-pay | Admitting: Oncology

## 2014-07-25 LAB — CBC CANCER CENTER
Basophil #: 0.1 x10 3/mm (ref 0.0–0.1)
Basophil %: 1.8 %
Eosinophil #: 0.4 x10 3/mm (ref 0.0–0.7)
Eosinophil %: 8.6 %
HCT: 33.9 % — ABNORMAL LOW (ref 40.0–52.0)
HGB: 11.2 g/dL — ABNORMAL LOW (ref 13.0–18.0)
Lymphocyte #: 0.6 x10 3/mm — ABNORMAL LOW (ref 1.0–3.6)
Lymphocyte %: 12.1 %
MCH: 29.6 pg (ref 26.0–34.0)
MCHC: 33.1 g/dL (ref 32.0–36.0)
MCV: 89 fL (ref 80–100)
Monocyte #: 0.8 x10 3/mm (ref 0.2–1.0)
Monocyte %: 15.2 %
Neutrophil #: 3.2 x10 3/mm (ref 1.4–6.5)
Neutrophil %: 62.3 %
PLATELETS: 294 x10 3/mm (ref 150–440)
RBC: 3.8 10*6/uL — AB (ref 4.40–5.90)
RDW: 18.6 % — ABNORMAL HIGH (ref 11.5–14.5)
WBC: 5.1 x10 3/mm (ref 3.8–10.6)

## 2014-07-25 LAB — COMPREHENSIVE METABOLIC PANEL
ALK PHOS: 68 U/L
Albumin: 3.6 g/dL (ref 3.4–5.0)
Anion Gap: 9 (ref 7–16)
BILIRUBIN TOTAL: 0.2 mg/dL (ref 0.2–1.0)
BUN: 22 mg/dL — ABNORMAL HIGH (ref 7–18)
CALCIUM: 8.8 mg/dL (ref 8.5–10.1)
CREATININE: 1.17 mg/dL (ref 0.60–1.30)
Chloride: 102 mmol/L (ref 98–107)
Co2: 29 mmol/L (ref 21–32)
EGFR (African American): >60
Glucose: 108 mg/dL — ABNORMAL HIGH (ref 65–99)
Osmolality: 283 (ref 275–301)
Potassium: 3.9 mmol/L (ref 3.5–5.1)
SGOT(AST): 16 U/L (ref 15–37)
SGPT (ALT): 24 U/L
Sodium: 140 mmol/L (ref 136–145)
Total Protein: 6.7 g/dL (ref 6.4–8.2)

## 2014-07-25 LAB — MAGNESIUM: MAGNESIUM: 1.8 mg/dL

## 2014-08-14 LAB — COMPREHENSIVE METABOLIC PANEL
ALK PHOS: 66 U/L
ALT: 24 U/L
Albumin: 3.4 g/dL (ref 3.4–5.0)
Anion Gap: 9 (ref 7–16)
BILIRUBIN TOTAL: 0.2 mg/dL (ref 0.2–1.0)
BUN: 21 mg/dL — AB (ref 7–18)
CHLORIDE: 103 mmol/L (ref 98–107)
Calcium, Total: 8.7 mg/dL (ref 8.5–10.1)
Co2: 31 mmol/L (ref 21–32)
Creatinine: 1.19 mg/dL (ref 0.60–1.30)
EGFR (Non-African Amer.): >60
GLUCOSE: 123 mg/dL — AB (ref 65–99)
OSMOLALITY: 289 (ref 275–301)
Potassium: 3.6 mmol/L (ref 3.5–5.1)
SGOT(AST): 13 U/L — ABNORMAL LOW (ref 15–37)
SODIUM: 143 mmol/L (ref 136–145)
Total Protein: 6.6 g/dL (ref 6.4–8.2)

## 2014-08-14 LAB — CBC CANCER CENTER
Basophil #: 0.1 x10 3/mm (ref 0.0–0.1)
Basophil %: 2.2 %
EOS PCT: 1.8 %
Eosinophil #: 0.1 x10 3/mm (ref 0.0–0.7)
HCT: 31.5 % — ABNORMAL LOW (ref 40.0–52.0)
HGB: 10.4 g/dL — AB (ref 13.0–18.0)
LYMPHS ABS: 0.5 x10 3/mm — AB (ref 1.0–3.6)
LYMPHS PCT: 16.2 %
MCH: 29.9 pg (ref 26.0–34.0)
MCHC: 33.1 g/dL (ref 32.0–36.0)
MCV: 90 fL (ref 80–100)
MONO ABS: 0.7 x10 3/mm (ref 0.2–1.0)
Monocyte %: 20.3 %
NEUTROS PCT: 59.5 %
Neutrophil #: 1.9 x10 3/mm (ref 1.4–6.5)
Platelet: 328 x10 3/mm (ref 150–440)
RBC: 3.48 10*6/uL — ABNORMAL LOW (ref 4.40–5.90)
RDW: 16.4 % — ABNORMAL HIGH (ref 11.5–14.5)
WBC: 3.2 x10 3/mm — ABNORMAL LOW (ref 3.8–10.6)

## 2014-08-22 ENCOUNTER — Emergency Department: Payer: Self-pay | Admitting: Emergency Medicine

## 2014-08-22 LAB — COMPREHENSIVE METABOLIC PANEL
ALK PHOS: 100 U/L
AST: 19 U/L (ref 15–37)
Albumin: 3.6 g/dL (ref 3.4–5.0)
Anion Gap: 4 — ABNORMAL LOW (ref 7–16)
BILIRUBIN TOTAL: 0.6 mg/dL (ref 0.2–1.0)
BUN: 17 mg/dL (ref 7–18)
Calcium, Total: 8.7 mg/dL (ref 8.5–10.1)
Chloride: 105 mmol/L (ref 98–107)
Co2: 30 mmol/L (ref 21–32)
Creatinine: 1 mg/dL (ref 0.60–1.30)
EGFR (African American): >60
GLUCOSE: 104 mg/dL — AB (ref 65–99)
Osmolality: 279 (ref 275–301)
Potassium: 3.5 mmol/L (ref 3.5–5.1)
SGPT (ALT): 24 U/L
Sodium: 139 mmol/L (ref 136–145)
Total Protein: 6.6 g/dL (ref 6.4–8.2)

## 2014-08-22 LAB — URINALYSIS, COMPLETE
Bilirubin,UR: NEGATIVE
Glucose,UR: NEGATIVE mg/dL (ref 0–75)
KETONE: NEGATIVE
Nitrite: NEGATIVE
Ph: 5 (ref 4.5–8.0)
Protein: >=500
RBC,UR: 380 /HPF (ref 0–5)
SPECIFIC GRAVITY: 1.022 (ref 1.003–1.030)
Squamous Epithelial: NONE SEEN
WBC UR: 617 /HPF (ref 0–5)

## 2014-08-22 LAB — CBC WITH DIFFERENTIAL/PLATELET
BASOS ABS: 0.1 10*3/uL (ref 0.0–0.1)
BASOS PCT: 0.3 %
Eosinophil #: 0.1 10*3/uL (ref 0.0–0.7)
Eosinophil %: 0.3 %
HCT: 34.5 % — ABNORMAL LOW (ref 40.0–52.0)
HGB: 11.3 g/dL — ABNORMAL LOW (ref 13.0–18.0)
LYMPHS ABS: 0.6 10*3/uL — AB (ref 1.0–3.6)
Lymphocyte %: 1.9 %
MCH: 29.9 pg (ref 26.0–34.0)
MCHC: 32.6 g/dL (ref 32.0–36.0)
MCV: 92 fL (ref 80–100)
MONO ABS: 0.6 x10 3/mm (ref 0.2–1.0)
MONOS PCT: 2 %
NEUTROS ABS: 30.5 10*3/uL — AB (ref 1.4–6.5)
Neutrophil %: 95.5 %
PLATELETS: 239 10*3/uL (ref 150–440)
RBC: 3.77 10*6/uL — ABNORMAL LOW (ref 4.40–5.90)
RDW: 16.1 % — ABNORMAL HIGH (ref 11.5–14.5)
WBC: 31.9 10*3/uL — AB (ref 3.8–10.6)

## 2014-08-22 LAB — TROPONIN I: TROPONIN-I: 0.02 ng/mL

## 2014-08-22 LAB — LIPASE, BLOOD: Lipase: 91 U/L (ref 73–393)

## 2014-08-23 ENCOUNTER — Ambulatory Visit: Payer: Self-pay | Admitting: Oncology

## 2014-08-23 HISTORY — PX: COLONOSCOPY: SHX174

## 2014-08-26 LAB — CBC CANCER CENTER
BASOS PCT: 1.3 %
Basophil #: 0 x10 3/mm (ref 0.0–0.1)
EOS PCT: 9.9 %
Eosinophil #: 0.2 x10 3/mm (ref 0.0–0.7)
HCT: 31.6 % — ABNORMAL LOW (ref 40.0–52.0)
HGB: 10.5 g/dL — AB (ref 13.0–18.0)
LYMPHS ABS: 0.3 x10 3/mm — AB (ref 1.0–3.6)
Lymphocyte %: 16.1 %
MCH: 30 pg (ref 26.0–34.0)
MCHC: 33.3 g/dL (ref 32.0–36.0)
MCV: 90 fL (ref 80–100)
MONOS PCT: 8.3 %
Monocyte #: 0.2 x10 3/mm (ref 0.2–1.0)
Neutrophil #: 1.4 x10 3/mm (ref 1.4–6.5)
Neutrophil %: 64.4 %
Platelet: 162 x10 3/mm (ref 150–440)
RBC: 3.5 10*6/uL — AB (ref 4.40–5.90)
RDW: 15.6 % — ABNORMAL HIGH (ref 11.5–14.5)
WBC: 2.2 x10 3/mm — ABNORMAL LOW (ref 3.8–10.6)

## 2014-08-30 DIAGNOSIS — Z466 Encounter for fitting and adjustment of urinary device: Secondary | ICD-10-CM | POA: Diagnosis not present

## 2014-08-30 DIAGNOSIS — Z9359 Other cystostomy status: Secondary | ICD-10-CM | POA: Diagnosis not present

## 2014-09-02 LAB — CBC CANCER CENTER
Basophil #: 0.1 x10 3/mm (ref 0.0–0.1)
Basophil %: 0.6 %
Eosinophil #: 0.2 x10 3/mm (ref 0.0–0.7)
Eosinophil %: 2.3 %
HCT: 31.2 % — AB (ref 40.0–52.0)
HGB: 10.3 g/dL — ABNORMAL LOW (ref 13.0–18.0)
Lymphocyte #: 0.5 x10 3/mm — ABNORMAL LOW (ref 1.0–3.6)
Lymphocyte %: 6.3 %
MCH: 30.1 pg (ref 26.0–34.0)
MCHC: 33 g/dL (ref 32.0–36.0)
MCV: 91 fL (ref 80–100)
MONOS PCT: 14.8 %
Monocyte #: 1.2 x10 3/mm — ABNORMAL HIGH (ref 0.2–1.0)
NEUTROS ABS: 6.3 x10 3/mm (ref 1.4–6.5)
NEUTROS PCT: 76 %
Platelet: 258 x10 3/mm (ref 150–440)
RBC: 3.41 10*6/uL — ABNORMAL LOW (ref 4.40–5.90)
RDW: 15.8 % — ABNORMAL HIGH (ref 11.5–14.5)
WBC: 8.3 x10 3/mm (ref 3.8–10.6)

## 2014-09-03 DIAGNOSIS — R350 Frequency of micturition: Secondary | ICD-10-CM | POA: Diagnosis not present

## 2014-09-03 DIAGNOSIS — N400 Enlarged prostate without lower urinary tract symptoms: Secondary | ICD-10-CM | POA: Diagnosis not present

## 2014-09-03 DIAGNOSIS — R300 Dysuria: Secondary | ICD-10-CM | POA: Diagnosis not present

## 2014-09-03 DIAGNOSIS — R339 Retention of urine, unspecified: Secondary | ICD-10-CM | POA: Diagnosis not present

## 2014-09-09 LAB — CBC CANCER CENTER
BASOS ABS: 0.1 x10 3/mm (ref 0.0–0.1)
Basophil %: 1.1 %
Eosinophil #: 0.1 x10 3/mm (ref 0.0–0.7)
Eosinophil %: 1.3 %
HCT: 32 % — ABNORMAL LOW (ref 40.0–52.0)
HGB: 10.7 g/dL — ABNORMAL LOW (ref 13.0–18.0)
LYMPHS PCT: 12.1 %
Lymphocyte #: 0.7 x10 3/mm — ABNORMAL LOW (ref 1.0–3.6)
MCH: 30.4 pg (ref 26.0–34.0)
MCHC: 33.4 g/dL (ref 32.0–36.0)
MCV: 91 fL (ref 80–100)
Monocyte #: 1.1 x10 3/mm — ABNORMAL HIGH (ref 0.2–1.0)
Monocyte %: 18.8 %
NEUTROS PCT: 66.7 %
Neutrophil #: 3.8 x10 3/mm (ref 1.4–6.5)
Platelet: 371 x10 3/mm (ref 150–440)
RBC: 3.51 10*6/uL — AB (ref 4.40–5.90)
RDW: 16.3 % — ABNORMAL HIGH (ref 11.5–14.5)
WBC: 5.7 x10 3/mm (ref 3.8–10.6)

## 2014-09-16 LAB — CBC CANCER CENTER
BASOS PCT: 1.5 %
Basophil #: 0.1 x10 3/mm (ref 0.0–0.1)
EOS PCT: 6.3 %
Eosinophil #: 0.2 x10 3/mm (ref 0.0–0.7)
HCT: 32.8 % — AB (ref 40.0–52.0)
HGB: 10.8 g/dL — AB (ref 13.0–18.0)
LYMPHS ABS: 0.5 x10 3/mm — AB (ref 1.0–3.6)
Lymphocyte %: 13.3 %
MCH: 30.1 pg (ref 26.0–34.0)
MCHC: 33 g/dL (ref 32.0–36.0)
MCV: 91 fL (ref 80–100)
Monocyte #: 0.7 x10 3/mm (ref 0.2–1.0)
Monocyte %: 18.7 %
Neutrophil #: 2.3 x10 3/mm (ref 1.4–6.5)
Neutrophil %: 60.2 %
Platelet: 280 x10 3/mm (ref 150–440)
RBC: 3.59 10*6/uL — AB (ref 4.40–5.90)
RDW: 16.4 % — ABNORMAL HIGH (ref 11.5–14.5)
WBC: 3.9 x10 3/mm (ref 3.8–10.6)

## 2014-09-16 LAB — COMPREHENSIVE METABOLIC PANEL
ALT: 33 U/L
ANION GAP: 8 (ref 7–16)
Albumin: 3.4 g/dL (ref 3.4–5.0)
Alkaline Phosphatase: 76 U/L
BUN: 17 mg/dL (ref 7–18)
Bilirubin,Total: 0.3 mg/dL (ref 0.2–1.0)
CALCIUM: 8.4 mg/dL — AB (ref 8.5–10.1)
Chloride: 105 mmol/L (ref 98–107)
Co2: 31 mmol/L (ref 21–32)
Creatinine: 1.01 mg/dL (ref 0.60–1.30)
EGFR (Non-African Amer.): >60
Glucose: 106 mg/dL — ABNORMAL HIGH (ref 65–99)
Osmolality: 289 (ref 275–301)
POTASSIUM: 3.5 mmol/L (ref 3.5–5.1)
SGOT(AST): 20 U/L (ref 15–37)
Sodium: 144 mmol/L (ref 136–145)
Total Protein: 6.3 g/dL — ABNORMAL LOW (ref 6.4–8.2)

## 2014-09-16 LAB — LACTATE DEHYDROGENASE: LDH: 202 U/L (ref 85–241)

## 2014-09-23 ENCOUNTER — Ambulatory Visit: Payer: Self-pay | Admitting: Oncology

## 2014-09-24 DIAGNOSIS — R350 Frequency of micturition: Secondary | ICD-10-CM | POA: Diagnosis not present

## 2014-10-04 DIAGNOSIS — N390 Urinary tract infection, site not specified: Secondary | ICD-10-CM | POA: Diagnosis not present

## 2014-10-10 DIAGNOSIS — N401 Enlarged prostate with lower urinary tract symptoms: Secondary | ICD-10-CM | POA: Diagnosis not present

## 2014-10-10 DIAGNOSIS — R0602 Shortness of breath: Secondary | ICD-10-CM | POA: Diagnosis not present

## 2014-10-10 DIAGNOSIS — I2510 Atherosclerotic heart disease of native coronary artery without angina pectoris: Secondary | ICD-10-CM | POA: Diagnosis not present

## 2014-10-10 DIAGNOSIS — Z8572 Personal history of non-Hodgkin lymphomas: Secondary | ICD-10-CM | POA: Diagnosis not present

## 2014-10-21 ENCOUNTER — Ambulatory Visit: Payer: Self-pay | Admitting: Oncology

## 2014-10-21 DIAGNOSIS — C8580 Other specified types of non-Hodgkin lymphoma, unspecified site: Secondary | ICD-10-CM | POA: Diagnosis not present

## 2014-10-21 DIAGNOSIS — C8590 Non-Hodgkin lymphoma, unspecified, unspecified site: Secondary | ICD-10-CM | POA: Diagnosis not present

## 2014-10-21 DIAGNOSIS — K8020 Calculus of gallbladder without cholecystitis without obstruction: Secondary | ICD-10-CM | POA: Diagnosis not present

## 2014-10-21 DIAGNOSIS — N323 Diverticulum of bladder: Secondary | ICD-10-CM | POA: Diagnosis not present

## 2014-10-21 DIAGNOSIS — I2510 Atherosclerotic heart disease of native coronary artery without angina pectoris: Secondary | ICD-10-CM | POA: Diagnosis not present

## 2014-10-23 ENCOUNTER — Ambulatory Visit
Admit: 2014-10-23 | Disposition: A | Discharge status: Home / Self Care | Payer: Self-pay | Attending: Oncology | Admitting: Oncology

## 2014-10-23 DIAGNOSIS — Z7982 Long term (current) use of aspirin: Secondary | ICD-10-CM | POA: Diagnosis not present

## 2014-10-23 DIAGNOSIS — Z79899 Other long term (current) drug therapy: Secondary | ICD-10-CM | POA: Diagnosis not present

## 2014-10-23 DIAGNOSIS — K219 Gastro-esophageal reflux disease without esophagitis: Secondary | ICD-10-CM | POA: Diagnosis not present

## 2014-10-23 DIAGNOSIS — K8020 Calculus of gallbladder without cholecystitis without obstruction: Secondary | ICD-10-CM | POA: Diagnosis not present

## 2014-10-23 DIAGNOSIS — Z87891 Personal history of nicotine dependence: Secondary | ICD-10-CM | POA: Diagnosis not present

## 2014-10-23 DIAGNOSIS — E785 Hyperlipidemia, unspecified: Secondary | ICD-10-CM | POA: Diagnosis not present

## 2014-10-23 DIAGNOSIS — M129 Arthropathy, unspecified: Secondary | ICD-10-CM | POA: Diagnosis not present

## 2014-10-23 DIAGNOSIS — R599 Enlarged lymph nodes, unspecified: Secondary | ICD-10-CM | POA: Diagnosis not present

## 2014-10-23 DIAGNOSIS — N281 Cyst of kidney, acquired: Secondary | ICD-10-CM | POA: Diagnosis not present

## 2014-10-23 DIAGNOSIS — I517 Cardiomegaly: Secondary | ICD-10-CM | POA: Diagnosis not present

## 2014-10-23 DIAGNOSIS — I2510 Atherosclerotic heart disease of native coronary artery without angina pectoris: Secondary | ICD-10-CM | POA: Diagnosis not present

## 2014-10-23 DIAGNOSIS — N401 Enlarged prostate with lower urinary tract symptoms: Secondary | ICD-10-CM | POA: Diagnosis not present

## 2014-10-23 DIAGNOSIS — G4700 Insomnia, unspecified: Secondary | ICD-10-CM | POA: Diagnosis not present

## 2014-10-23 DIAGNOSIS — K5900 Constipation, unspecified: Secondary | ICD-10-CM | POA: Diagnosis not present

## 2014-10-23 DIAGNOSIS — C8590 Non-Hodgkin lymphoma, unspecified, unspecified site: Secondary | ICD-10-CM | POA: Diagnosis not present

## 2014-10-23 DIAGNOSIS — Z9350 Unspecified cystostomy status: Secondary | ICD-10-CM | POA: Diagnosis not present

## 2014-10-23 DIAGNOSIS — I10 Essential (primary) hypertension: Secondary | ICD-10-CM | POA: Diagnosis not present

## 2014-11-06 DIAGNOSIS — L300 Nummular dermatitis: Secondary | ICD-10-CM | POA: Diagnosis not present

## 2014-11-06 DIAGNOSIS — R202 Paresthesia of skin: Secondary | ICD-10-CM | POA: Diagnosis not present

## 2014-11-06 DIAGNOSIS — L570 Actinic keratosis: Secondary | ICD-10-CM | POA: Diagnosis not present

## 2014-11-06 DIAGNOSIS — X32XXXA Exposure to sunlight, initial encounter: Secondary | ICD-10-CM | POA: Diagnosis not present

## 2014-11-06 DIAGNOSIS — S1086XA Insect bite of other specified part of neck, initial encounter: Secondary | ICD-10-CM | POA: Diagnosis not present

## 2014-11-06 DIAGNOSIS — L821 Other seborrheic keratosis: Secondary | ICD-10-CM | POA: Diagnosis not present

## 2014-11-07 DIAGNOSIS — N401 Enlarged prostate with lower urinary tract symptoms: Secondary | ICD-10-CM | POA: Diagnosis not present

## 2014-11-07 DIAGNOSIS — R300 Dysuria: Secondary | ICD-10-CM | POA: Diagnosis not present

## 2014-11-07 DIAGNOSIS — R339 Retention of urine, unspecified: Secondary | ICD-10-CM | POA: Diagnosis not present

## 2014-11-14 DIAGNOSIS — N390 Urinary tract infection, site not specified: Secondary | ICD-10-CM | POA: Diagnosis not present

## 2014-11-22 ENCOUNTER — Ambulatory Visit
Admit: 2014-11-22 | Disposition: A | Discharge status: Home / Self Care | Payer: Self-pay | Attending: Oncology | Admitting: Oncology

## 2014-12-10 NOTE — Op Note (Signed)
PATIENT NAME:  Bryan Nelson, Bryan Nelson MR#:  [id number] DATE OF BIRTH:  1932-06-20  DATE OF PROCEDURE:  06/26/2012  PREOPERATIVE DIAGNOSIS:  Cataract, left eye.    POSTOPERATIVE DIAGNOSIS:  Cataract, left eye.  PROCEDURE PERFORMED:  Extracapsular cataract extraction using phacoemulsification with placement of an Alcon SN6CWS, 18.5-diopter posterior chamber lens, serial # [id number].  SURGEON:  Loura Back. Johnjoseph Rolfe, MD  ASSISTANT:  None.  ANESTHESIA:  4% lidocaine and 0.75% Marcaine in a 50/50 mixture with 10 units/mL of Hylenex added, given as peribulbar.  ANESTHESIOLOGIST:  Dr. Andree Elk   COMPLICATIONS:  None.  ESTIMATED BLOOD LOSS:  Less than 1 mL.  DESCRIPTION OF PROCEDURE:  The patient was brought to the operating room and given a peribulbar block.  The patient was then prepped and draped in the usual fashion.  The vertical rectus muscles were imbricated using 5-0 silk sutures.  These sutures were then clamped to the sterile drapes as bridle sutures.  A limbal peritomy was performed extending two clock hours and hemostasis was obtained with cautery.  A partial thickness scleral groove was made at the surgical limbus and dissected anteriorly in a lamellar dissection using an Alcon crescent knife.  The anterior chamber was entered supero-temporally with a Superblade and through the lamellar dissection with a 2.6 mm keratome.  DisCoVisc was used to replace the aqueous and a continuous tear capsulorrhexis was carried out.  Hydrodissection and hydrodelineation were carried out with balanced salt and a 27 gauge canula.  The nucleus was rotated to confirm the effectiveness of the hydrodissection.  Phacoemulsification was carried out using a divide-and-conquer technique.  Total ultrasound time was 1 minute and 50 seconds with an average power of 21.4 percent, CDE 41.25.  Irrigation/aspiration was used to remove the residual cortex.  DisCoVisc was used to inflate the capsule and the internal incision was  enlarged to 3 mm with the crescent knife.  The intraocular lens was folded and inserted into the capsular bag using the AcrySert delivery system.  Irrigation/aspiration was used to remove the residual DisCoVisc.  Miostat was injected into the anterior chamber through the paracentesis track to inflate the anterior chamber and induce miosis.  The wound was checked for leaks and none were found. The conjunctiva was closed with cautery and the bridle sutures were removed.  Two drops of 0.3% Vigamox were placed on the eye.   An eye shield was placed on the eye.  The patient was discharged to the recovery room in good condition.  ____________________________ Loura Back Peachie Barkalow, MD sad:drc D: 06/26/2012 13:49:46 ET T: 06/26/2012 17:37:16 ET JOB#: [id number]  cc: Remo Lipps A. Caroleann Casler, MD, <Dictator> Martie Lee MD ELECTRONICALLY SIGNED 07/03/2012 13:16

## 2014-12-13 NOTE — H&P (Signed)
PATIENT NAME:  Bryan Nelson, Bryan Nelson MR#:  [id number] DATE OF BIRTH:  1931/10/09  DATE OF ADMISSION:  09/30/2012  PRIMARY CARE PHYSICIAN:  Dr. Lelon Huh.   REFERRING PHYSICIAN:  Dr. Pollie Friar.   CHIEF COMPLAINT:  Chest pain.   HISTORY OF PRESENT ILLNESS:  The patient is a 81 year old Caucasian male with a history of coronary artery disease with a history of multiple stent implants.  History of systemic hypertension and hyperlipidemia.  He was in his usual state of health until tonight around 8:00 p.m. while he was working on his computer.  He developed left-sided chest pain described as burning sensation.  The severity was 8 on a scale of 10, nonradiating, without associated shortness of breath.  No vomiting.  The pain lasted about two hours then he decided to come to the Emergency Department.  His initial work-up was negative including unremarkable EKG and his first troponin was normal.  His chest pain had eased down now to about 2 on a scale of 10.  The patient is being admitted to the hospital for further evaluation and management.  The patient tells me that his chest pain probably is similar to his chest pain when he had non-STEMI in December of 2012, but he adds that the pain at that time was more severe than this one.  Additionally, he is unsure whether this is related to his acid reflux symptoms.   REVIEW OF SYSTEMS:   CONSTITUTIONAL:  Denies any fever.  No chills.  No fatigue.  EYES:  No blurring of vision.  No double vision.  EARS, NOSE, THROAT:  No hearing impairment.  No sore throat.  No dysphagia.  CARDIOVASCULAR:  Reports chest pain as above.  No shortness of breath.  No edema.  No syncope.  RESPIRATORY:  No cough.  No sputum production.  No shortness of breath.  No hemoptysis.  GASTROINTESTINAL:  No abdominal pain, no vomiting, no diarrhea.  GENITOURINARY:  No dysuria.  No frequency of urination.  MUSCULOSKELETAL:  No joint pain or swelling.  No muscular pain or swelling.   INTEGUMENTARY:  No skin rash.  No ulcers.  NEUROLOGY:  No focal weakness.  No seizure activity.  No headache.  PSYCHIATRY:  No anxiety.  No depression.  ENDOCRINE:  No polyuria or polydipsia.  No heat or cold intolerance.  HEMATOLOGY:  No easy bruisability.  No lymph node enlargement.   PAST MEDICAL HISTORY:  Last admission to this hospital was in December 2012.  He had a non-STEMI.  He was seen by Dr. Clayborn Bigness and underwent a drug-eluting stent to RCA.  Prior to the stent his cardiac cath showed 90% to 95% stenosis of the distal RCA.  Prior to that he has also history of stent to LAD as well as the circumflex.  The patient's cardiologist is Dr. Pleas Patricia at [facility].  His history also includes systemic hypertension, hyperlipidemia, gastroesophageal reflux disease, benign prostatic hypertrophy.   PAST SURGICAL HISTORY:  Cardiac stenting, otherwise no other surgical history.   SOCIAL HABITS:  Nonsmoker.  No history of alcohol or drug abuse.   FAMILY HISTORY:  His father suffered from Alzheimer's dementia.  Both parents died at the age of 44 from old age.   SOCIAL HISTORY:  He is widowed.  Lives at home alone.  He works as a Psychologist, occupational at [employer].   ADMISSION MEDICATIONS:  Aspirin 325 mg a day, tamsulosin 0.4 mg a day, Sam Palmetto 450 mg 3  times a day, ranitidine 150 mg 2 tablets at night, Prilosec or omeprazole 40 mg once a day, ramipril 2.5 mg once a day, vitamin B12 2500 mcg once a day, Nitrostat 0.4 mg q. 5 minutes as needed, metoprolol tartrate 100 mg twice a day, hydrochlorothiazide 25 mg a day, finasteride 5 mg once a day, Plavix 75 mg once a day, Cialis 5 mg once a day, Carafate 1 gram twice a day, atorvastatin 10 mg a day.   ALLERGIES:  No known drug allergies.   PHYSICAL EXAMINATION: VITAL SIGNS:  Blood pressure 126/63, respiratory rate 20, pulse 68, temperature 96.8, oxygen saturation 98%.  GENERAL APPEARANCE:  Elderly male lying in  bed in no acute distress.  HEAD AND NECK:  No pallor.  No icterus.  No cyanosis.  EARS, NOSE, THROAT:  Ear examination revealed normal hearing, no discharge, no lesions.  Nasal mucosa was normal without discharge.  No lesions, no ulcers.  Oropharyngeal area was normal without ulcers, no oral thrush.  EYES:  Normal eyelids and conjunctivae.  Pupils about 5 to 6 mm, equal and reactive to light.  NECK:  Supple.  Trachea at midline.  No thyromegaly.  No cervical lymphadenopathy.  No masses.  HEART:  Normal S1, S2.  No S3, S4.  No murmur.  No gallop.  No carotid bruits.  RESPIRATORY:  Normal breathing pattern without use of accessory muscles.  No rales.  No wheezing.  ABDOMEN:  Soft without tenderness.  No hepatosplenomegaly.  No masses.  No hernias.  SKIN:  No ulcers.  No subcutaneous nodules.  MUSCULOSKELETAL:  No joint swelling.  No clubbing.  NEUROLOGIC:  Cranial nerves II-XII were intact.  No focal motor deficit.  PSYCHIATRIC:  The patient is alert and oriented x 3.  Mood and affect were normal.   LABORATORY FINDINGS:  Chest x-ray showed no acute cardiopulmonary abnormalities.  There is mild elevation of the left hemidiaphragm.  EKG showed normal sinus rhythm at rate of 83 per minute, right axis deviation, incomplete right bundle branch block.  No change compared to his EKG that was done in October 2013.  Serum glucose 107, BUN 24, creatinine 1.2, sodium 140, potassium 3.4.  Normal liver function tests and liver transaminases.  Total CPK 181.  Troponin less than 0.02.  CBC showed white count of 4000, hemoglobin 12.8, hematocrit 39, platelet count 198.   ASSESSMENT: 1.  Chest pain, for evaluation.  No EKG changes and first troponin is normal.  2.  Mild hypokalemia.  3.  History of coronary artery disease, status post non-ST-elevation myocardial infarction and stent implant.  4.  Systemic hypertension.  5.  Hyperlipidemia.  6.  Gastroesophageal reflux disease.  7.  History of benign prostatic  hypertrophy.   PLAN:  We will admit the patient for observation.  Follow up on the cardiac enzymes.  Aspirin and continue Plavix as well in addition to beta-blocker.  Add long-acting nitrite.  We will resume Imdur 30 mg a day.  Continue the rest of his home medications, but I will hold Cialis.  We will schedule the patient for a nuclear stress test and consultation with cardiology, Dr. Rockey Situ.  Potassium supplementation to correct the hypokalemia.   CODE STATUS:  FULL CODE.    For deep vein thrombosis prophylaxis, we will place the patient on subcutaneous heparin 5000 units twice a day.    TIME SPENT IN EVALUATING THIS PATIENT AND REVIEW OF THE MEDICAL RECORDS:  Took more than 55 minutes.  ____________________________ Clovis Pu. Lenore Manner, MD amd:ea D: 09/30/2012 00:25:00 ET T: 09/30/2012 02:51:42 ET JOB#: [id number]  cc: Clovis Pu. Lenore Manner, MD, <Dictator> Ellin Saba MD ELECTRONICALLY SIGNED 09/30/2012 8:14

## 2014-12-13 NOTE — Discharge Summary (Signed)
PATIENT NAME:  [name], Nelson MR#:  [id number] DATE OF BIRTH:  June 10, 1932  DATE OF ADMISSION:  09/30/2012 DATE OF DISCHARGE:  10/03/2012  PRIMARY CARE PHYSICIAN:  Lelon Huh.   CONSULTATION:  Dr. Clayborn Bigness.  PROCEDURE:  Cardiac cath.    DISCHARGE DIAGNOSES: 1.  Unstable angina.  2.  Coronary artery disease.  3.  Hypertension.  4.  Hyperlipidemia.  5.  Diarrhea.   CODE STATUS: Full code.   CONDITION: Stable.   HOME MEDICATIONS:   1.  Omeprazole 40 mg p.o. daily.  2.  Flomax 0.4 mg p.o. daily.  3.  Aspirin 325 mg p.o. daily.  4.  Nitrostat 0.4 mg sublingual 1 tablet every 5 minutes x 3 p.r.n. for chest pain.  5.  Finasteride 5 mg p.o. daily.  6.  Ranitidine 150 mg p.o. 2 tablets at bedtime.  7.  Plavix 75 mg p.o. daily.  8.  Lopressor 100 mg p.o. b.i.d.  9.  Carafate 1 gram p.o. b.i.d.  10.  Atorvastatin 10 mg p.o. at bedtime.  11.  Ramipril 2.5 mg p.o. daily.  12.  Vitamin B12 2500 units p.o. once a day at bedtime.  13.  HCTZ 25 mg p.o. daily.   DIET: Low sodium, low fat, low cholesterol diet.   ACTIVITY: As tolerated.   FOLLOWUP CARE:  Follow with PCP within 1 to 2 weeks. Follow with Dr. Clayborn Bigness, cardiology,  within 1 to 2 weeks.  REASON FOR ADMISSION: Chest pain.   HOSPITAL COURSE: The patient is a 82 year old Caucasian male with a history of CAD with multiple stent implants, hypertension and hyperlipidemia who presented to the ED with chest pain. Initial workup was negative including unremarkable EKG, negative troponin. For detailed history and physical examination, please refer to the admission note dictated by Dr. Lenore Manner. On admission date, the patient's chest x-ray showed no acute cardiopulmonary abnormality. Glucose 107, BUN 24, creatinine 1.2. CBC 4000, hemoglobin 20.8. After admission, the patient has been treated with aspirin and Plavix in addition to beta blocker. Also, Dr. Clayborn Bigness evaluated the patient, did a cardiac cath which showed multiple-vessel  stenosis but, according to Dr. Clayborn Bigness, continue medical management. The patient's dehydration also improved after IV fluid.   The patient developed diarrhea about 4 times yesterday. C. diff. was sent.  The patient was treated with Flagyl, however, stool C. diff. test is negative. The patient has no chest pain after admission. The patient denies any other symptoms. His vital signs are stable. He is clinically stable and will be discharged to home today. I discussed the patient's discharge plan with the patient and the case manager.   TIME SPENT: About 35 minutes.     ____________________________ Bryan Loll, MD qc:cs D: 10/03/2012 14:48:20 ET T: 10/03/2012 20:39:13 ET JOB#: [id number]  cc: Bryan Loll, MD, <Dictator> Bryan Loll MD ELECTRONICALLY SIGNED 10/05/2012 20:26

## 2014-12-13 NOTE — Consult Note (Signed)
General Aspect 81 yo with history of HTN, elevated lipids and CAD.  Admitted with chest pain   Present Illness Patient admitted Friday night by IM. Had SSCP while working at computer.  Pain lasted over 2 hours and he went to ER.  No acute ECG changes and negative enzymes.  History of CAD.  Cardiologist at [facility]. Stents to circumflex and LAD.  Most recently stent to distal RCA by Dr Clayborn Bigness at [facility] 12/12.  Currently pain free.  IM had initially ordered myovue for am   Physical Exam:  GEN no acute distress   NECK supple   RESP normal resp effort  clear BS   CARD Regular rate and rhythm  No murmur   ABD denies tenderness  soft  normal BS   EXTR negative edema   Review of Systems:  General: No Complaints   Skin: No Complaints   ENT: No Complaints   Eyes: No Complaints   Neck: No Complaints   Respiratory: No Complaints   Cardiovascular: No Complaints   Gastrointestinal: No Complaints   Genitourinary: No Complaints   Vascular: No Complaints   Musculoskeletal: No Complaints   Neurologic: No Complaints   Hematologic: No Complaints   Endocrine: No Complaints   Medications/Allergies Reviewed Medications/Allergies reviewed     BPH:    gerd:    hyperlipidemia:    htn:    cardiac stent:        Admit Diagnosis:   CHEST PAIN: Onset Date: 30-Sep-2012, Status: Active, Description: CHEST PAIN  Lab Results: Hepatic:  07-Feb-14 21:41   Bilirubin, Total 0.3  Alkaline Phosphatase 83  SGPT (ALT) 23  SGOT (AST) 19  Total Protein, Serum 7.0  Albumin, Serum 3.9  Routine Chem:  07-Feb-14 21:41   Glucose, Serum  107  BUN  24  Creatinine (comp) 1.28  Sodium, Serum 140  Potassium, Serum  3.4  Chloride, Serum 104  CO2, Serum 30  Calcium (Total), Serum 8.6  Osmolality (calc) 284  eGFR (African American) >60  eGFR (Non-African American)  53 (eGFR values <18m/min/1.73 m2 may be an indication of chronic kidney disease (CKD). Calculated eGFR is  useful in patients with stable renal function. The eGFR calculation will not be reliable in acutely ill patients when serum creatinine is changing rapidly. It is not useful in  patients on dialysis. The eGFR calculation may not be applicable to patients at the low and high extremes of body sizes, pregnant women, and vegetarians.)  Anion Gap  6  Cardiac:  07-Feb-14 21:41   Troponin I < 0.02 (0.00-0.05 0.05 ng/mL or less: NEGATIVE  Repeat testing in 3-6 hrs  if clinically indicated. >0.05 ng/mL: POTENTIAL  MYOCARDIAL INJURY. Repeat  testing in 3-6 hrs if  clinically indicated. NOTE: An increase or decrease  of 30% or more on serial  testing suggests a  clinically important change)  Routine Hem:  07-Feb-14 21:41   WBC (CBC) 4.9  RBC (CBC) 4.41  Hemoglobin (CBC)  12.8  Hematocrit (CBC)  39.5  Platelet Count (CBC) 198 (Result(s) reported on 29 Sep 2012 at 09:54PM.)  MCV 89  MCH 29.1  MCHC 32.5  RDW  15.6   EKG:  Additional Comments ECG from ER NSR no acute changes    No Known Allergies:   Vital Signs/Nurse's Notes: **Vital Signs.:   08-Feb-14 20:53  Vital Signs Type Routine  Celsius 36.7  Temperature Source oral  Pulse Pulse 70  Respirations Respirations 19  Systolic BP Systolic BP 1458  Diastolic BP (mmHg) Diastolic BP (mmHg) 65  Mean BP 84  Pulse Ox % Pulse Ox % 97  Pulse Ox Activity Level  At rest  Oxygen Delivery 2L    Impression Prolonged chest pain in patient with known CAD and stents in all three major coronary arteries.  SSCP similar to pain prior to last PCI by Dr Clayborn Bigness.  NO acute ECG changes and enzymes negative   Plan Agree with adding nitrates Continue ASA and Plavix Discussed options with patient Cancelled myovue and will plan cath on Monday.  He is willing to have [facility] do cath despite previous care and [facility] and with Dr Clayborn Bigness   Electronic Signatures: Josue Hector (MD)  (Signed 08-Feb-14 23:34)  Authored: General  Aspect/Present Illness, History and Physical Exam, Review of System, Past Medical History, Health Issues, Labs, EKG , Allergies, Vital Signs/Nurse's Notes, Impression/Plan   Last Updated: 08-Feb-14 23:34 by Josue Hector (MD)

## 2014-12-14 NOTE — Op Note (Signed)
PATIENT NAME:  Bryan Nelson, Bryan Nelson MR#:  [id number] DATE OF BIRTH:  01-20-1932  DATE OF PROCEDURE:  04/23/2014  PREOPERATIVE DIAGNOSIS: Lymphoma.   POSTOPERATIVE DIAGNOSIS: Lymphoma.   OPERATION: Insertion of venous access port.   ANESTHESIA: Monitored care with the use of local anesthetic containing 0.5% Marcaine and 1% Xylocaine, total of 21 mL used.   ADDITIONAL PROCEDURES: Ultrasound and fluoroscopic guidance.   COMPLICATIONS: None.   ESTIMATED BLOOD LOSS: Minimal.   DRAINS: None.   DESCRIPTION OF PROCEDURE: The patient was placed in the supine position on the operating table and the left upper chest and neck area were prepped and draped out as a sterile field. With adequate sedation, the ultrasound probe was brought up to the field. Timeout was performed. The subclavian vein was identified with the ultrasound beneath the lateral end of the clavicle.  The local anesthetic was instilled and a 1-cm incision made. Through this, a needle was successfully positioned going into the subclavian vein with ultrasound guidance with free withdrawal of blood.  Seldinger technique was then used to position a catheter going into the superior vena cava and this was done with the use of fluoroscopy. Local anesthetic was instilled along the left second intercostal space.  A skin incision was made and a subcutaneous pocket was created.  The intervening space between the 2 incisions was infiltrated with local anesthetic. The catheter was tunneled through to the port site, cut to approximate length and fixed to a prefilled port. The port was then placed in the pocket and anchored to the underlying fascia with 3 stitches of 2-0 Prolene and then flushed through with the heparinized saline. The catheter was smoothed out at the entrance site near the clavicle. Fluoroscopy was repeated once again to ensure proper positioning of the catheter and noted that there was no kinks. The subcutaneous tissue was closed with 3-0  Vicryl. The skin with subcuticular 4-0 Vicryl, covered with Dermabond. The procedure was well tolerated. The patient subsequently returned to the recovery room in stable condition    ____________________________ S.Robinette Haines, MD sgs:lr D: 04/24/2014 13:14:43 ET T: 04/24/2014 14:01:49 ET JOB#: [id number]  cc: S.G. Jamal Collin, MD, <Dictator> [facility] Robinette Haines MD ELECTRONICALLY SIGNED 04/24/2014 15:56

## 2014-12-15 NOTE — Consult Note (Signed)
PATIENT NAME:  Bryan Nelson, Bryan Nelson MR#:  [id number] DATE OF BIRTH:  1932/02/19  DATE OF CONSULTATION:  08/08/2011  REFERRING PHYSICIAN:  Dr. Caryn Section CONSULTING PHYSICIAN:  Dwayne D. Callwood, MD  INDICATION: Non-Q-wave myocardial infarction, unstable angina with coronary artery disease.   HISTORY OF PRESENT ILLNESS: Mr. Delatte is a 81 year old white male known to me with history of coronary artery disease, angioplasty and stenting about 10 years ago. He has been treated medically for his coronary disease. He has significant reflux symptoms as well. He had a stress test about 2 to 3 years ago, which he states was unremarkable. He is followed by Dr. Sheppard Coil at [facility], but started having chest pain on the day of admission, epigastric to left upper chest pain, excruciating, probably 8/10, with some radiation to his back. He gets short of breath and because of the discomfort came to Emergency Room for evaluation. Troponins were found to be 0.1 so he was admitted for further evaluation and care.   REVIEW OF SYSTEMS: No blackout spells or syncope. No nausea or vomiting. No fever. No chills. No sweats. No weight loss. No weight gain, hemoptysis, or hematemesis. No bright red blood per rectum.   PAST MEDICAL HISTORY:  1. Coronary artery disease.  2. Reflux.  3. Benign prostatic hypertrophy.  4. Hyperlipidemia.  5. Hypertension. 6. Mild obesity.   PAST SURGICAL HISTORY: Angioplasty and stenting times two.   ALLERGIES: None.   MEDICATIONS:  1. Aspirin 81 mg a day.  2. Carafate 1 gram 3 times a day. 3. Finasteride daily. 4. Hydrochlorothiazide 25 mg a day.  5. Metoprolol 100 mg twice a day.  6. Multivitamin once a day.  7. Tamsulosin 0.4 mg daily.  8. Vitamin D3 daily.  9. Saw palmetto 450 daily.  10. Omeprazole 40 daily.  11. Ranitidine 150 twice a day.  12. Vitamin D once a day.   FAMILY HISTORY: Positive for hypertension.   SOCIAL HISTORY: No smoking. No alcohol consumption. Works as a  Psychologist, occupational at [employer].   PHYSICAL EXAMINATION:  VITAL SIGNS: Blood pressure 144/75, pulse 60, respiratory rate 18, afebrile.   HEENT: Normocephalic, atraumatic. Pupils reactive to light.  NECK:  Supple. No jugular venous distention, bruits, or adenopathy.   LUNGS: Clear to auscultation and percussion. No wheezing, rhonchi, or rales.   HEART: Regular rate and rhythm. Positive S4. Systolic ejection murmur left sternal border. PMI is nondisplaced.   ABDOMEN: Benign. Positive bowel sounds. No rebound, guarding, or tenderness.   EXTREMITIES: Within normal limits.   NEUROLOGIC: Examination is intact.   SKIN: Normal.  LABORATORY AND RADIOLOGICAL DATA: EKG: Normal sinus rhythm, nonspecific findings, incomplete right bundle branch block. Chest x-ray unremarkable. CBC normal. MET-B normal. Troponin was 1.    ASSESSMENT: 1. Unstable angina and non-Q-wave myocardial infarction.  2. Coronary artery disease.  3. Hypertension.  4. Reflux.  5. Benign prostatic hypertrophy.   PLAN: Agree with admit. Rule out for myocardial infarction.  Place on telemetry. Continue aspirin. Would probably add Plavix. Consider heparin. We will see whether Integrilin is in order.  Also proceed with nitroglycerin as necessary. Echocardiogram would be helpful. We will probably proceed with cardiac catheterization to evaluate his coronary anatomy. Continue blood pressure control. Continue statin therapy. Base further evaluation on the results of these studies.    ____________________________ Loran Senters Clayborn Bigness, MD ddc:bjt D: 08/09/2011 09:20:17 ET T: 08/09/2011 11:59:00 ET JOB#: [id number]  cc: Dwayne D. Clayborn Bigness, MD, <Dictator> Yolonda Kida MD ELECTRONICALLY SIGNED 09/02/2011 5:57

## 2014-12-15 NOTE — Op Note (Signed)
PATIENT NAME:  [name], Bryan Nelson MR#:  [id number] DATE OF BIRTH:  1932-08-10  DATE OF PROCEDURE:  09/20/2011  PREOPERATIVE DIAGNOSIS:  Cataract, right eye.   POSTOPERATIVE DIAGNOSIS:  Cataract, right eye.  PROCEDURE PERFORMED:  Extracapsular cataract extraction using phacoemulsification with placement of an Alcon [id number], 17.5-diopter posterior chamber lens, serial [id number].  SURGEON:  Loura Back. Selam Pietsch, MD  ASSISTANT:  None.  ANESTHESIA:  4% lidocaine and 0.75% Marcaine in a 50/50 mixture with 10 units/mL of Hylenex added, given as a peribulbar.   ANESTHESIOLOGIST: Dr. Marcello Moores.   COMPLICATIONS:  None.  ESTIMATED BLOOD LOSS:  Less than 1 ml.  DESCRIPTION OF PROCEDURE:  The patient was brought to the operating room and given a peribulbar block.  The patient was then prepped and draped in the usual fashion.  The vertical rectus muscles were imbricated using 5-0 silk sutures.  These sutures were then clamped to the sterile drapes as bridle sutures.  A limbal peritomy was performed extending two clock hours and hemostasis was obtained with cautery.  A partial thickness scleral groove was made at the surgical limbus and dissected anteriorly in a lamellar dissection using an Alcon crescent knife.  The anterior chamber was entered superonasally with a Superblade and through the lamellar dissection with a 2.6 mm keratome.  DisCoVisc was used to replace the aqueous and a continuous tear capsulorrhexis was carried out.  Hydrodissection and hydrodelineation were carried out with balanced salt and a 27 gauge canula.  The nucleus was rotated to confirm the effectiveness of the hydrodissection.  Phacoemulsification was carried out using a divide-and-conquer technique.  Total ultrasound time was one minute and 17 seconds with an average power of 20.1 percent.  Irrigation/aspiration was used to remove the residual cortex.  DisCoVisc was used to inflate the capsule and the internal incision was enlarged  to 3 mm with the crescent knife.  The intraocular lens was folded and inserted into the capsular bag using the AcrySert delivery system.  Irrigation/aspiration was used to remove the residual DisCoVisc.  Miostat was injected into the anterior chamber through the paracentesis track to inflate the anterior chamber and induce miosis.  The wound was checked for leaks and none were found. The conjunctiva was closed with cautery and the bridle sutures were removed.  Two drops of 0.3% Vigamox were placed on the eye.   An eye shield was placed on the eye.  The patient was discharged to the recovery room in good condition.   ____________________________ Loura Back Shamyia Grandpre, MD sad:ap D: 09/20/2011 13:08:59 ET T: 09/20/2011 13:43:08 ET JOB#: [id number]  cc: Remo Lipps A. Rodgerick Gilliand, MD, <Dictator> Martie Lee MD ELECTRONICALLY SIGNED 09/27/2011 12:24

## 2014-12-15 NOTE — Discharge Summary (Signed)
PATIENT NAME:  Bryan Nelson, Bryan Nelson MR#:  [id number] DATE OF BIRTH:  07/12/1932  DATE OF ADMISSION:  08/08/2011 DATE OF DISCHARGE:  08/10/2011  PRIMARY CARE PHYSICIAN: Dr. Lelon Huh  PRIMARY CARDIOLOGIST: Dr. Pleas Patricia at [place]: Chest pain.   DISCHARGE DIAGNOSES:  1. Non-STEMI status post cardiac catheterization with drug-eluting stent to RCA. 2. Coronary artery disease status post coronary stents with stent to the LAD as well as circumflex in the past, now with non-STEMI status post drug-eluting stent to RCA.  3. History of hypertension.  4. History of hyperlipidemia. 5. History of benign prostatic hypertrophy,  6. History of gastroesophageal reflux disease.  DISCHARGE MEDICATIONS:  1. Aspirin 325 mg p.o. daily. 2. Effient 10 mg daily.  3. Imdur 30 mg daily.  4. Nitrostat 0.4 mg sublingually q. 5 minutes times three p.r.n. chest pain.  5. Metoprolol 25 mg p.o. b.i.d.  6. Finasteride 5 mg daily.  7. Tylenol 325 mg 1 to 2 tablets p.o. every 4 to 6 hours p.r.n. pain. 8. Saw palmetto 450 mg daily.  9. Ranitidine 150 mg b.i.d.  10. Carafate 1 gram p.o. t.i.d.  11. Omeprazole 40 mg daily.  12. Vitamin D3 daily.  13. Tamsulosin 0.4 mg daily. 14. Multivitamin 1 tablet daily.   DISCHARGE DISPOSITION: Home.   DISCHARGE CONDITION: Improved, stable.  DISCHARGE ACTIVITY: As tolerated.   DISCHARGE DIET: Low sodium, low fat, low cholesterol.    FOLLOWUP INSTRUCTIONS:  1. Follow up with Dr. Caryn Section within 1 to 2 weeks.  2. Follow up with cardiologist Dr. Sheppard Coil at [facility] in 1 to 2 weeks.   CONSULTANTS DURING THIS HOSPITALIZATION: Dr. Clayborn Bigness of cardiology.   PROCEDURES: 1. Chest x-ray PA and lateral 08/08/2011: No evidence of acute cardiopulmonary abnormalities.  2. Cardiac catheterization 08/09/2011:  Drug-eluting stent was performed of the 90% lesion in the distal RCA. There is a widely patent stent noted at [place]. There is 40 to  75% proximal/ mid LAD stent open with moderate in-stent disease, 40 to 50% ostial disease, 50% circumflex in-stent. 90-95% lesion in the distal RCA status post drug-eluting stent.   PERTINENT LABORATORY DATA:  EKG on admission: Normal sinus rhythm, heart rate 60 beats per minute with incomplete right bundle branch block.   EKG 08/09/2011: Normal sinus rhythm with incomplete right bundle branch block.   Cardiac enzymes on admission: Troponin 0.1, second troponin 0.25, third troponin 0.26.   Lipid panel: Total cholesterol of 145, triglycerides 254, HDL 29, LDL 65.   Creatinine 0.86 on admission and discharge.   CBC normal on admission.   BRIEF HISTORY AND HOSPITAL COURSE: The patient is a 81 year old male with past medical history of hypertension, hyperlipidemia, benign prostatic hypertrophy, gastroesophageal reflux disease, coronary artery disease status post coronary stent who presented to the Emergency Department with complaints of chest pain. Please see dictated admission History and Physical for pertinent details surrounding the onset of this hospitalization. Please see below for further details.  1. Non-STEMI:  In a patient with known coronary artery disease status post former coronary stents. The patient came to the ER with chest pain and was noted to have elevated troponin with no ST elevations noted on EKG, consistent with non-STEMI. He was initially placed on oxygen, aspirin, Lovenox, and beta blocker therapy as well as nitrate and thereafter admitted to the telemetry unit. With these measures the patient eventually became chest pain free. His beta blocker has been reduced some given asymptomatic bradycardia  and to prevent worsening bradycardia. Cardiology consultation was obtained and Dr. Clayborn Bigness recommended cardiac catheterization for further assessment. The patient was noted to have a 90-95% stenosis of his distal RCA on cardiac catheterization and this was felt to be the culprit lesion  of his non-ST elevation myocardial infarction. Thereafter he underwent drug-eluting stent to the distal RCA. Following intervention there was 0% stenosis.  Since drug-eluting stent was placed, Dr. Clayborn Bigness recommends keeping the patient on Effient for now in addition to aspirin. Therefore for patient's non-STEMI/coronary artery disease for now he will be on medical management with aspirin, Effient, beta blocker, and nitrate therapy. Could also consider adding ACE inhibitor to his regimen but his current blood pressure is well controlled and we did not want to precipitate any hypotension; therefore, we have not started ACE inhibitor at this time. He will follow up with cardiology in this regard to see if and when ACE inhibitor can be started as an outpatient. Otherwise, post intervention and with medications listed above, the patient remained chest-pain free. He has ambulated well on the day of discharge without any chest pain or shortness of breath. He will follow up with cardiology closely as an outpatient and he will need medical management of his coronary artery disease for now in addition to aggressive risk factor modification.  2. Hypertension: Overall the patient's blood pressure has been well controlled during this hospitalization. We did make some adjustments to the patient's antihypertensives. We have stopped the patient's HCTZ therapy and started him on Imdur instead. Also, since the patient was noted to have asymptomatic bradycardia his beta- blocker dose has been reduced. Since he had a myocardial infarction, ACE inhibitor can be considered as an outpatient, but currently blood pressure is well controlled and this has not been started at this time as we did not want to predispose the patient to hypotension.  3. Hyperlipidemia:  This is diet controlled and LDL is at goal at 65. The patient is to continue diet control for now.  4. Gastroesophageal reflux disease: The patient is to continue Zantac and  Carafate as well as omeprazole for now.  5. Benign prostatic hypertrophy: The patient is to continue Flomax and he also wishes to take saw palmetto at home.   On 08/10/2011 the patient was hemodynamically stable without any chest pain at rest or with ambulation and was felt to be stable for discharge home with close outpatient followup to which the patient was agreeable.   TIME SPENT ON DISCHARGE:  Greater than 30 minutes.   ____________________________ Romie Jumper, MD knl:bjt D: 08/15/2011 03:58:09 ET T: 08/16/2011 11:35:29 ET JOB#: [id number]  cc: Romie Jumper, MD, <Dictator> Dr. Pleas Patricia, [place]. Clayborn Bigness, MD Kirstie Peri Caryn Section, MD Romie Jumper MD ELECTRONICALLY SIGNED 08/26/2011 16:24

## 2014-12-31 DIAGNOSIS — R319 Hematuria, unspecified: Secondary | ICD-10-CM | POA: Diagnosis not present

## 2015-01-13 ENCOUNTER — Encounter: Payer: Self-pay | Admitting: *Deleted

## 2015-01-17 ENCOUNTER — Other Ambulatory Visit: Payer: Self-pay | Admitting: *Deleted

## 2015-01-17 DIAGNOSIS — C8590 Non-Hodgkin lymphoma, unspecified, unspecified site: Secondary | ICD-10-CM

## 2015-01-23 ENCOUNTER — Other Ambulatory Visit: Payer: Self-pay

## 2015-01-23 ENCOUNTER — Ambulatory Visit: Payer: Self-pay | Admitting: Oncology

## 2015-01-28 ENCOUNTER — Telehealth: Payer: Self-pay | Admitting: Urology

## 2015-01-28 NOTE — Telephone Encounter (Signed)
Called pt about rescheduling Dr. Hart's cysto app from 6/14 to 7/5 and the pt wondered if he needed to continue his antibiotic. Was unable to give me the name of the Rx as he was not at home. Best contact # [phone] °01/28/15 maf °

## 2015-01-28 NOTE — Telephone Encounter (Signed)
Spoke with Bryan Nelson and made him aware to continue abt until cysto with Dr. Elnoria Howard. Bryan Nelson voiced understanding. Cw,lpn

## 2015-02-02 ENCOUNTER — Other Ambulatory Visit: Payer: Self-pay | Admitting: Family Medicine

## 2015-02-02 NOTE — Telephone Encounter (Signed)
Please call in   [place]  5 minutes ago   (9:46 AM)       Requested Medications     Medication name:  Name from pharmacy:  temazepam (RESTORIL) 30 MG capsule TEMAZEPAM 30 MG CAPSULE    Sig: take 1 capsule by mouth at bedtime if needed    Dispense: 90 capsule (Pharmacy requested 90)   Refills: 1

## 2015-02-04 NOTE — Telephone Encounter (Signed)
Rx phoned into pharmacy.

## 2015-02-19 ENCOUNTER — Telehealth: Payer: Self-pay

## 2015-02-19 ENCOUNTER — Other Ambulatory Visit: Payer: Medicare Other

## 2015-02-19 DIAGNOSIS — N390 Urinary tract infection, site not specified: Secondary | ICD-10-CM

## 2015-02-19 LAB — URINALYSIS, COMPLETE
Bilirubin, UA: NEGATIVE
GLUCOSE, UA: NEGATIVE
KETONES UA: NEGATIVE
Nitrite, UA: POSITIVE — AB
Specific Gravity, UA: 1.015 (ref 1.005–1.030)
UUROB: 0.2 mg/dL (ref 0.2–1.0)
pH, UA: 5.5 (ref 5.0–7.5)

## 2015-02-19 LAB — MICROSCOPIC EXAMINATION

## 2015-02-19 MED ORDER — CIPROFLOXACIN HCL 500 MG PO TABS: 500.0000 mg | ORAL_TABLET | Freq: Two times a day (BID) | ORAL | Status: AC

## 2015-02-19 MED ORDER — CIPROFLOXACIN HCL 500 MG PO TABS: 500.0000 mg | ORAL_TABLET | Freq: Two times a day (BID) | ORAL | Status: DC

## 2015-02-19 NOTE — Telephone Encounter (Signed)
Spoke with pt. Pt is on the way to office now to give a urine sample.

## 2015-02-19 NOTE — Telephone Encounter (Signed)
Pt called stating he is having to urinate every 30 minutes. Pt saw Dr. Elnoria Howard in May for dysuria and was started on bactrim ds daily until cysto. Pt has a significant hx of uti and performs CIC bid. Please advise.

## 2015-02-19 NOTE — Telephone Encounter (Signed)
Please have him come by and drop of a urine sample to r/o infection.

## 2015-02-23 LAB — CULTURE, URINE COMPREHENSIVE

## 2015-02-25 ENCOUNTER — Telehealth: Payer: Self-pay | Admitting: Urology

## 2015-02-25 ENCOUNTER — Ambulatory Visit: Payer: Medicare Other | Admitting: Urology

## 2015-02-25 ENCOUNTER — Encounter: Payer: Self-pay | Admitting: Urology

## 2015-02-25 VITALS — BP 148/74 | HR 71 | Ht 73.0 in | Wt 252.6 lb

## 2015-02-25 DIAGNOSIS — R300 Dysuria: Secondary | ICD-10-CM

## 2015-02-25 DIAGNOSIS — N390 Urinary tract infection, site not specified: Secondary | ICD-10-CM

## 2015-02-25 MED ORDER — CEFUROXIME AXETIL 250 MG PO TABS: 250.0000 mg | ORAL_TABLET | Freq: Two times a day (BID) | ORAL | Status: DC

## 2015-02-25 MED ORDER — LIDOCAINE HCL 2 % EX GEL: 1.0000 "application " | Freq: Once | CUTANEOUS | Status: DC

## 2015-02-25 NOTE — Telephone Encounter (Signed)
Please let this patient know that he did have a UTI which was indeed sensitive to the cipro which was given on 02/19/15.  Please let us know if his urinary symptoms are not improving.    Hollice Espy, MD

## 2015-02-25 NOTE — Progress Notes (Signed)
Patient briefly seen and urinalysis reviewed. Patient has continued bacteria in his urine. Cannot do his scheduled cystoscopy so I will place the patient on Ceftin 500 mg daily. I have reviewed his urinalysis personally [place] coliform bacteria although it is only moderate bacteria and less than he's had passed urinalysis. He is cathing himself but only gets a little bit out in the evening before he goes to bed. I think he may be contaminating himself with self-catheterization and have stop self-catheterization physically his vital signs are stable lungs are clear to auscultation is alert and oriented 3 ambulatory with no gait abnormalities. Think it safe to send him home reculture his urine start him on a new antibiotic. Signed

## 2015-02-25 NOTE — Telephone Encounter (Signed)
Pt came in for an appt today and everything was handled via Dr. Elnoria Howard. Cw,lpn

## 2015-02-26 LAB — URINALYSIS, COMPLETE
Bilirubin, UA: NEGATIVE
GLUCOSE, UA: NEGATIVE
KETONES UA: NEGATIVE
Nitrite, UA: NEGATIVE
Specific Gravity, UA: 1.02 (ref 1.005–1.030)
Urobilinogen, Ur: 0.2 mg/dL (ref 0.2–1.0)
pH, UA: 5.5 (ref 5.0–7.5)

## 2015-02-26 LAB — MICROSCOPIC EXAMINATION

## 2015-03-01 LAB — CULTURE, URINE COMPREHENSIVE

## 2015-03-07 ENCOUNTER — Other Ambulatory Visit: Payer: Self-pay

## 2015-03-07 ENCOUNTER — Telehealth: Payer: Self-pay

## 2015-03-07 DIAGNOSIS — N390 Urinary tract infection, site not specified: Secondary | ICD-10-CM

## 2015-03-07 MED ORDER — NITROFURANTOIN MONOHYD MACRO 100 MG PO CAPS: 100.0000 mg | ORAL_CAPSULE | Freq: Two times a day (BID) | ORAL | Status: DC

## 2015-03-07 NOTE — Telephone Encounter (Signed)
Pt called back in confusion with which medication he was supposed to take. Per Dr. Erlene Quan pt was taking a prophylactic dose and now is needing a treatment dose. Dr. Erlene Quan also stated that he would prefer for Dr. Elnoria Howard to make the final decision on Tuesday. Nurse called pt back and made aware of Dr. Cherrie Gauze orders. Pt voiced understanding. Cw,lpn

## 2015-03-07 NOTE — Progress Notes (Signed)
Pt called wanting results of urine cx. Per Dr. Erlene Quan pt was put on macrobid bid x10days. Medication called into pt pharmacy. Pt was upset and voiced concern of not getting a call back in reference to cx results. Nurse made pt aware that Dr. Elnoria Howard is here every other week and his results went to Dr. Elnoria Howard. Pt voiced understanding but was upset and not happy with answer. Cw,lpn

## 2015-04-03 ENCOUNTER — Encounter: Payer: Self-pay | Admitting: Urology

## 2015-04-03 ENCOUNTER — Ambulatory Visit (INDEPENDENT_AMBULATORY_CARE_PROVIDER_SITE_OTHER): Payer: Medicare Other | Admitting: Urology

## 2015-04-03 VITALS — BP 125/71 | HR 60 | Resp 18 | Ht 73.0 in | Wt 250.8 lb

## 2015-04-03 DIAGNOSIS — N139 Obstructive and reflux uropathy, unspecified: Secondary | ICD-10-CM

## 2015-04-03 DIAGNOSIS — R312 Other microscopic hematuria: Secondary | ICD-10-CM | POA: Diagnosis not present

## 2015-04-03 DIAGNOSIS — N400 Enlarged prostate without lower urinary tract symptoms: Secondary | ICD-10-CM

## 2015-04-03 DIAGNOSIS — R3129 Other microscopic hematuria: Secondary | ICD-10-CM

## 2015-04-03 LAB — URINALYSIS, COMPLETE
Bilirubin, UA: NEGATIVE
Glucose, UA: NEGATIVE
KETONES UA: NEGATIVE
Nitrite, UA: NEGATIVE
PROTEIN UA: NEGATIVE
Specific Gravity, UA: 1.02 (ref 1.005–1.030)
Urobilinogen, Ur: 0.2 mg/dL (ref 0.2–1.0)
pH, UA: 6 (ref 5.0–7.5)

## 2015-04-03 LAB — MICROSCOPIC EXAMINATION: WBC, UA: >30 /hpf — ABNORMAL HIGH (ref 0–?)

## 2015-04-03 NOTE — Progress Notes (Signed)
Patient here for cystoscopy. His had previous bladder tumor and also is having some voiding difficulties having the cath himself. Cystoscopy revealed trilobar hypertrophy of the prostate with a prostate length of approximately 3.0 cm. Middle lobe especially prominent. Patient has been on antibiotics and will continue on Macrobid them. Plan KPT laser for this patient's inability to void well. He did previously evaluated and cared for by Dr. Fannie Knee Patient was not having none of these real problems 1 he was being cared for Dr. Darlys Gales office. His problems have occurred over the last year and increasing pace. The patient.

## 2015-04-03 NOTE — Progress Notes (Signed)
    Cystoscopy Procedure Note  Patient identification was confirmed, informed consent was obtained, and patient was prepped using Betadine solution.  Lidocaine jelly was administered per urethral meatus.    Preoperative abx where received prior to procedure.     Pre-Procedure: - Inspection reveals a normal caliber ureteral meatus.  Procedure: The flexible cystoscope was introduced without difficulty - No urethral strictures/lesions are present. -   Patient has trilobar hypertrophy of the prostate with especially prominent middle lobe. Retroflexion reveals no tumors -  bladder neck - Bilateral ureteral orifices identified - Bladder mucosa  reveals no ulcers, tumors, or lesions - No bladder stones - No trabeculation  Retroflexion shows no tumors in the bladder.   Post-Procedure: - Patient tolerated the procedure well

## 2015-04-08 ENCOUNTER — Telehealth: Payer: Self-pay | Admitting: Urology

## 2015-04-08 NOTE — Telephone Encounter (Signed)
The pt came in to the office today and informed me that he's been off his Plavix for over a year due to a surgery he had at the cancer center over 1 year ago. The provider had him to stop it then and he never start back. I told the pt he needs to contact Dr. Clayborn Bigness office and schedule an appt w/ him.  I called back and spoke to Eloy, Dr. Clayborn Bigness nurse and notified her that he been off his Plavix and that he is currently taking ASA 81MG  and we need him to stop that to as well. She informed me that it was okay for him to stop the ASA. i also requested that sh fax over another  clearance w/ this inform added on. The pt was notified of his Pre-Admit appt.

## 2015-04-08 NOTE — Telephone Encounter (Signed)
Attempted to contact the pt, no answer. LMOM to return my call. I also called Dr. Clayborn Bigness office and spoke w/ Mardene Celeste she confirmed that the clearance was done and faxed to Pre-Admit.

## 2015-04-10 ENCOUNTER — Encounter
Admission: RE | Admit: 2015-04-10 | Discharge: 2015-04-10 | Disposition: A | Discharge status: Home / Self Care | Payer: Medicare Other | Source: Ambulatory Visit | Attending: Urology | Admitting: Urology

## 2015-04-10 DIAGNOSIS — Z01812 Encounter for preprocedural laboratory examination: Secondary | ICD-10-CM | POA: Insufficient documentation

## 2015-04-10 DIAGNOSIS — E669 Obesity, unspecified: Secondary | ICD-10-CM | POA: Diagnosis not present

## 2015-04-10 DIAGNOSIS — Z01810 Encounter for preprocedural cardiovascular examination: Secondary | ICD-10-CM | POA: Insufficient documentation

## 2015-04-10 DIAGNOSIS — I209 Angina pectoris, unspecified: Secondary | ICD-10-CM | POA: Diagnosis not present

## 2015-04-10 DIAGNOSIS — R0602 Shortness of breath: Secondary | ICD-10-CM | POA: Diagnosis not present

## 2015-04-10 DIAGNOSIS — I25119 Atherosclerotic heart disease of native coronary artery with unspecified angina pectoris: Secondary | ICD-10-CM | POA: Diagnosis not present

## 2015-04-10 LAB — BASIC METABOLIC PANEL
ANION GAP: 9 (ref 5–15)
BUN: 21 mg/dL — AB (ref 6–20)
CHLORIDE: 104 mmol/L (ref 101–111)
CO2: 28 mmol/L (ref 22–32)
Calcium: 9.2 mg/dL (ref 8.9–10.3)
Creatinine, Ser: 1.11 mg/dL (ref 0.61–1.24)
GFR calc Af Amer: >60 mL/min (ref >60)
GFR calc non Af Amer: >60 mL/min (ref >60)
GLUCOSE: 95 mg/dL (ref 65–99)
POTASSIUM: 4.2 mmol/L (ref 3.5–5.1)
Sodium: 141 mmol/L (ref 135–145)

## 2015-04-10 LAB — CBC
HEMATOCRIT: 38 % — AB (ref 40.0–52.0)
HEMOGLOBIN: 12.5 g/dL — AB (ref 13.0–18.0)
MCH: 29.4 pg (ref 26.0–34.0)
MCHC: 32.9 g/dL (ref 32.0–36.0)
MCV: 89.3 fL (ref 80.0–100.0)
Platelets: 205 10*3/uL (ref 150–440)
RBC: 4.26 MIL/uL — AB (ref 4.40–5.90)
RDW: 15.3 % — ABNORMAL HIGH (ref 11.5–14.5)
WBC: 5.5 10*3/uL (ref 3.8–10.6)

## 2015-04-10 NOTE — Pre-Procedure Instructions (Signed)
Called Dr. Guinevere Ferrari office with request for H+P, message left with Olivia Mackie.

## 2015-04-10 NOTE — Patient Instructions (Signed)
  Your procedure is scheduled on: Wednesday April 16, 2015. Report to Same Day Surgery. To find out your arrival time please call [phone] between 1PM - 3PM on Tuesday April 15, 2015.  Remember: Instructions that are not followed completely may result in serious medical risk, up to and including death, or upon the discretion of your surgeon and anesthesiologist your surgery may need to be rescheduled.    __x__ 1. Do not eat food or drink liquids after midnight. No gum chewing or hard candies.     ____ 2. No Alcohol for 24 hours before or after surgery.   ____ 3. Bring all medications with you on the day of surgery if instructed.    __x__ 4. Notify your doctor if there is any change in your medical condition     (cold, fever, infections).     Do not wear jewelry, make-up, hairpins, clips or nail polish.  Do not wear lotions, powders, or perfumes. You may wear deodorant.  Do not shave 48 hours prior to surgery. Men may shave face and neck.  Do not bring valuables to the hospital.    [facility] is not responsible for any belongings or valuables.               Contacts, dentures or bridgework may not be worn into surgery.  Leave your suitcase in the car. After surgery it may be brought to your room.  For patients admitted to the hospital, discharge time is determined by your treatment team.   Patients discharged the day of surgery will not be allowed to drive home.    Please read over the following fact sheets that you were given:   [facility] Preparing for Surgery  __x__ Take these medicines the morning of surgery with A SIP OF WATER:    1. isosorbide dinitrate (ISORDIL)  2. metoprolol (LOPRESSOR)  3. omeprazole (PRILOSEC)  4.ramipril (ALTACE   ____ Fleet Enema (as directed)   ____ Use CHG Soap as directed  ____ Use inhalers on the day of surgery  ____ Stop metformin 2 days prior to surgery    ____ Take 1/2 of usual insulin dose the night before surgery and none  on the morning of surgery.   ____ Already Stopped Coumadin/Plavix/aspirin on  April 08, 2015.  _x___ Stop Anti-inflammatories(Aleve) today.  OK to take Tylenol if needed.   ____ Stop supplements until after surgery.    ____ Bring C-Pap to the hospital.

## 2015-04-16 ENCOUNTER — Ambulatory Visit: Payer: Medicare Other | Admitting: Anesthesiology

## 2015-04-16 ENCOUNTER — Encounter: Payer: Self-pay | Admitting: Anesthesiology

## 2015-04-16 ENCOUNTER — Encounter
Admission: RE | Disposition: A | Discharge status: Home / Self Care | Payer: Self-pay | Source: Ambulatory Visit | Attending: Urology

## 2015-04-16 ENCOUNTER — Ambulatory Visit
Admission: RE | Admit: 2015-04-16 | Discharge: 2015-04-16 | Disposition: A | Discharge status: Home / Self Care | Payer: Medicare Other | Source: Ambulatory Visit | Attending: Urology | Admitting: Urology

## 2015-04-16 DIAGNOSIS — Z955 Presence of coronary angioplasty implant and graft: Secondary | ICD-10-CM | POA: Insufficient documentation

## 2015-04-16 DIAGNOSIS — K219 Gastro-esophageal reflux disease without esophagitis: Secondary | ICD-10-CM | POA: Diagnosis not present

## 2015-04-16 DIAGNOSIS — N401 Enlarged prostate with lower urinary tract symptoms: Secondary | ICD-10-CM | POA: Insufficient documentation

## 2015-04-16 DIAGNOSIS — N400 Enlarged prostate without lower urinary tract symptoms: Secondary | ICD-10-CM

## 2015-04-16 DIAGNOSIS — R0602 Shortness of breath: Secondary | ICD-10-CM | POA: Insufficient documentation

## 2015-04-16 DIAGNOSIS — E785 Hyperlipidemia, unspecified: Secondary | ICD-10-CM | POA: Diagnosis not present

## 2015-04-16 DIAGNOSIS — H9190 Unspecified hearing loss, unspecified ear: Secondary | ICD-10-CM | POA: Diagnosis not present

## 2015-04-16 DIAGNOSIS — R300 Dysuria: Secondary | ICD-10-CM

## 2015-04-16 DIAGNOSIS — Z7982 Long term (current) use of aspirin: Secondary | ICD-10-CM | POA: Diagnosis not present

## 2015-04-16 DIAGNOSIS — Z79899 Other long term (current) drug therapy: Secondary | ICD-10-CM | POA: Diagnosis not present

## 2015-04-16 DIAGNOSIS — N138 Other obstructive and reflux uropathy: Secondary | ICD-10-CM | POA: Insufficient documentation

## 2015-04-16 DIAGNOSIS — Z87891 Personal history of nicotine dependence: Secondary | ICD-10-CM | POA: Insufficient documentation

## 2015-04-16 DIAGNOSIS — Z8572 Personal history of non-Hodgkin lymphomas: Secondary | ICD-10-CM | POA: Insufficient documentation

## 2015-04-16 DIAGNOSIS — I2510 Atherosclerotic heart disease of native coronary artery without angina pectoris: Secondary | ICD-10-CM | POA: Diagnosis not present

## 2015-04-16 DIAGNOSIS — M1990 Unspecified osteoarthritis, unspecified site: Secondary | ICD-10-CM | POA: Diagnosis not present

## 2015-04-16 HISTORY — PX: GREEN LIGHT LASER TURP (TRANSURETHRAL RESECTION OF PROSTATE: SHX6260

## 2015-04-16 LAB — GLUCOSE, CAPILLARY: Glucose-Capillary: 63 mg/dL — ABNORMAL LOW (ref 65–99)

## 2015-04-16 SURGERY — GREEN LIGHT LASER TURP (TRANSURETHRAL RESECTION OF PROSTATE
Anesthesia: General | Wound class: Clean Contaminated

## 2015-04-16 MED ORDER — PROPOFOL 10 MG/ML IV BOLUS
INTRAVENOUS | Status: DC | PRN
  Administered 2015-04-16: 120 mg via INTRAVENOUS

## 2015-04-16 MED ORDER — MEPERIDINE HCL 25 MG/ML IJ SOLN
12.5000 mg | Freq: Once | INTRAMUSCULAR | Status: AC
  Administered 2015-04-16: 12.5 mg via INTRAVENOUS

## 2015-04-16 MED ORDER — CEFAZOLIN SODIUM 1-5 GM-% IV SOLN
1.0000 g | Freq: Once | INTRAVENOUS | Status: AC
  Administered 2015-04-16: 1 g via INTRAVENOUS

## 2015-04-16 MED ORDER — ONDANSETRON HCL 4 MG/2ML IJ SOLN
INTRAMUSCULAR | Status: DC | PRN
  Administered 2015-04-16: 4 mg via INTRAVENOUS

## 2015-04-16 MED ORDER — SODIUM CHLORIDE 0.9 % IR SOLN
Status: DC | PRN
  Administered 2015-04-16: 3000 mL

## 2015-04-16 MED ORDER — FENTANYL CITRATE (PF) 100 MCG/2ML IJ SOLN
25.0000 ug | INTRAMUSCULAR | Status: DC | PRN
  Administered 2015-04-16 (×2): 25 ug via INTRAVENOUS

## 2015-04-16 MED ORDER — LIDOCAINE HCL (CARDIAC) 20 MG/ML IV SOLN
INTRAVENOUS | Status: DC | PRN
  Administered 2015-04-16: 100 mg via INTRAVENOUS

## 2015-04-16 MED ORDER — BELLADONNA ALKALOIDS-OPIUM 16.2-60 MG RE SUPP
RECTAL | Status: DC | PRN
  Administered 2015-04-16: 1 via RECTAL

## 2015-04-16 MED ORDER — OXYCODONE-ACETAMINOPHEN 5-325 MG PO TABS: 1.0000 | ORAL_TABLET | Freq: Four times a day (QID) | ORAL | Status: DC | PRN

## 2015-04-16 MED ORDER — BUPIVACAINE HCL (PF) 0.5 % IJ SOLN
INTRAMUSCULAR | Status: AC
  Filled 2015-04-16: qty 30

## 2015-04-16 MED ORDER — STERILE WATER FOR IRRIGATION IR SOLN
Status: DC | PRN
  Administered 2015-04-16: 600 mL

## 2015-04-16 MED ORDER — MIDAZOLAM HCL 2 MG/2ML IJ SOLN
INTRAMUSCULAR | Status: DC | PRN
  Administered 2015-04-16: 1 mg via INTRAVENOUS

## 2015-04-16 MED ORDER — NEOSTIGMINE METHYLSULFATE 10 MG/10ML IV SOLN
INTRAVENOUS | Status: DC | PRN
  Administered 2015-04-16: 3 mg via INTRAVENOUS

## 2015-04-16 MED ORDER — BUPIVACAINE HCL (PF) 0.5 % IJ SOLN
INTRAMUSCULAR | Status: DC | PRN
Start: 2015-04-16 — End: 2015-04-16
  Administered 2015-04-16: 30 mL

## 2015-04-16 MED ORDER — FENTANYL CITRATE (PF) 100 MCG/2ML IJ SOLN
INTRAMUSCULAR | Status: DC | PRN
  Administered 2015-04-16: 150 ug via INTRAVENOUS
  Administered 2015-04-16: 100 ug via INTRAVENOUS

## 2015-04-16 MED ORDER — CEFAZOLIN SODIUM 1-5 GM-% IV SOLN
INTRAVENOUS | Status: AC
  Administered 2015-04-16: 1 g via INTRAVENOUS
  Filled 2015-04-16: qty 50

## 2015-04-16 MED ORDER — LACTATED RINGERS IV SOLN
INTRAVENOUS | Status: DC
  Administered 2015-04-16: 10:00:00 via INTRAVENOUS

## 2015-04-16 MED ORDER — FENTANYL CITRATE (PF) 100 MCG/2ML IJ SOLN
INTRAMUSCULAR | Status: AC
  Administered 2015-04-16: 25 ug via INTRAVENOUS
  Filled 2015-04-16: qty 2

## 2015-04-16 MED ORDER — ROCURONIUM BROMIDE 100 MG/10ML IV SOLN
INTRAVENOUS | Status: DC | PRN
  Administered 2015-04-16: 40 mg via INTRAVENOUS

## 2015-04-16 MED ORDER — ONDANSETRON HCL 4 MG/2ML IJ SOLN: 4.0000 mg | Freq: Once | INTRAMUSCULAR | Status: DC | PRN

## 2015-04-16 MED ORDER — GLYCOPYRROLATE 0.2 MG/ML IJ SOLN
INTRAMUSCULAR | Status: DC | PRN
  Administered 2015-04-16: .5 mg via INTRAVENOUS
  Administered 2015-04-16: .3 mg via INTRAVENOUS

## 2015-04-16 MED ORDER — CEFAZOLIN (ANCEF) 1 G IV SOLR: 1.0000 g | INTRAVENOUS | Status: DC

## 2015-04-16 MED ORDER — MEPERIDINE HCL 25 MG/ML IJ SOLN
INTRAMUSCULAR | Status: AC
  Filled 2015-04-16: qty 1

## 2015-04-16 SURGICAL SUPPLY — 32 items
ADAPTER IRRIG TUBE 2 SPIKE SOL (ADAPTER) ×6 IMPLANT
BAG URO DRAIN 2000ML W/SPOUT (MISCELLANEOUS) ×3 IMPLANT
BAG URO DRAIN 4000ML (MISCELLANEOUS) ×3 IMPLANT
CATH FOL 2WAY LX 22X30 (CATHETERS) ×3 IMPLANT
CATH FOLEY 3WAY 30CC 22FR (CATHETERS) ×3 IMPLANT
CATH FOLEY 3WAY 30CC 24FR (CATHETERS) ×2
CATH URTH STD 24FR FL 3W 2 (CATHETERS) ×1 IMPLANT
DRESSING TELFA 4X3 1S ST N-ADH (GAUZE/BANDAGES/DRESSINGS) ×3 IMPLANT
GLOVE BIO SURGEON STRL SZ7 (GLOVE) ×6 IMPLANT
GLOVE BIO SURGEON STRL SZ7.5 (GLOVE) ×3 IMPLANT
GOWN STRL REUS W/ TWL LRG LVL3 (GOWN DISPOSABLE) ×1 IMPLANT
GOWN STRL REUS W/ TWL XL LVL3 (GOWN DISPOSABLE) ×1 IMPLANT
GOWN STRL REUS W/TWL LRG LVL3 (GOWN DISPOSABLE) ×2
GOWN STRL REUS W/TWL XL LVL3 (GOWN DISPOSABLE) ×2
JELLY LUB 2OZ STRL (MISCELLANEOUS) ×2
JELLY LUBE 2OZ STRL (MISCELLANEOUS) ×1 IMPLANT
LASER GREENLIGHT XPS PROCEDURE (MISCELLANEOUS) ×3 IMPLANT
LASER GRNLGT MOXY FIBER 750UM (MISCELLANEOUS) ×3 IMPLANT
NDL SAFETY ECLIPSE 18X1.5 (NEEDLE) ×1 IMPLANT
NEEDLE HYPO 18GX1.5 SHARP (NEEDLE) ×2
PACK CYSTO AR (MISCELLANEOUS) ×3 IMPLANT
PAD GROUND ADULT SPLIT (MISCELLANEOUS) ×3 IMPLANT
PREP PVP WINGED SPONGE (MISCELLANEOUS) ×3 IMPLANT
SET IRRIG Y TYPE TUR BLADDER L (SET/KITS/TRAYS/PACK) ×3 IMPLANT
SOL .9 NS 3000ML IRR  AL (IV SOLUTION) ×12
SOL .9 NS 3000ML IRR UROMATIC (IV SOLUTION) ×6 IMPLANT
SOL PREP PVP 2OZ (MISCELLANEOUS) ×3
SOLUTION PREP PVP 2OZ (MISCELLANEOUS) ×1 IMPLANT
SYRINGE IRR TOOMEY STRL 70CC (SYRINGE) ×3 IMPLANT
TUBING CONNECTING 10 (TUBING) ×2 IMPLANT
TUBING CONNECTING 10' (TUBING) ×1
WATER STERILE IRR 1000ML POUR (IV SOLUTION) ×3 IMPLANT

## 2015-04-16 NOTE — Transfer of Care (Signed)
Immediate Anesthesia Transfer of Care Note  Patient: Bryan Nelson  Procedure(s) Performed: Procedure(s): GREEN LIGHT LASER TURP (TRANSURETHRAL RESECTION OF PROSTATE WITH BLADDER BIOPSY (N/A)  Patient Location: PACU  Anesthesia Type:General  Level of Consciousness: sedated and patient cooperative  Airway & Oxygen Therapy: Patient Spontanous Breathing and Patient connected to nasal cannula oxygen  Post-op Assessment: Report given to RN and Post -op Vital signs reviewed and stable  Post vital signs: Reviewed and stable  Last Vitals:  Filed Vitals:   04/16/15 1131  BP: 151/77  Pulse:   Temp: 36.1 C  Resp: 13    Complications: No apparent anesthesia complications

## 2015-04-16 NOTE — Progress Notes (Signed)
Left side of upper lip swollen- ice applied

## 2015-04-16 NOTE — Progress Notes (Signed)
Upon arrival to postop pt  states he is feeling jittery- pts hand shaking - vs stable. Crackers and juice given

## 2015-04-16 NOTE — Anesthesia Postprocedure Evaluation (Signed)
  Anesthesia Post-op Note  Patient: Bryan Nelson  Procedure(s) Performed: Procedure(s): GREEN LIGHT LASER TURP (TRANSURETHRAL RESECTION OF PROSTATE WITH BLADDER BIOPSY (N/A)  Anesthesia type:General  Patient location: PACU  Post pain: Pain level controlled  Post assessment: Post-op Vital signs reviewed, Patient's Cardiovascular Status Stable, Respiratory Function Stable, Patent Airway and No signs of Nausea or vomiting  Post vital signs: Reviewed and stable  Last Vitals:  Filed Vitals:   04/16/15 1225  BP: 130/94  Pulse: 72  Temp: 36.4 C  Resp: 16    Level of consciousness: awake, alert  and patient cooperative  Complications: No apparent anesthesia complications

## 2015-04-16 NOTE — Discharge Instructions (Addendum)
General Anesthesia, Care After Refer to this sheet in the next few weeks. These instructions provide you with information on caring for yourself after your procedure. Your health care provider may also give you more specific instructions. Your treatment has been planned according to current medical practices, but problems sometimes occur. Call your health care provider if you have any problems or questions after your procedure. WHAT TO EXPECT AFTER THE PROCEDURE After the procedure, it is typical to experience:  Sleepiness.  Nausea and vomiting. HOME CARE INSTRUCTIONS  For the first 24 hours after general anesthesia:  Have a responsible person with you.  Do not drive a car. If you are alone, do not take public transportation.  Do not drink alcohol.  Do not take medicine that has not been prescribed by your health care provider.  Do not sign important papers or make important decisions.  You may resume a normal diet and activities as directed by your health care provider.  Change bandages (dressings) as directed.  If you have questions or problems that seem related to general anesthesia, call the hospital and ask for the anesthetist or anesthesiologist on call. SEEK MEDICAL CARE IF:  You have nausea and vomiting that continue the day after anesthesia.  You develop a rash. SEEK IMMEDIATE MEDICAL CARE IF:   You have difficulty breathing.  You have chest pain.  You have any allergic problems. Document Released: 11/15/2000 Document Revised: 08/14/2013 Document Reviewed: 02/22/2013 [facility] Patient Information 2015 [place], [place]. This information is not intended to replace advice given to you by your health care provider. Make sure you discuss any questions you have with your health care provider. Transurethral Resection of the Prostate Care After Refer to this sheet in the next few weeks. These instructions provide you with information on caring for yourself after your  procedure. Your caregiver also may give you specific instructions. Your treatment has been planned according to current medical practices, but complications sometimes occur. Call your caregiver if you have any problems or questions after your procedure. HOME CARE INSTRUCTIONS  Recovery can take 4-6 weeks. Avoid alcohol, caffeinated drinks, and spicy foods for 2 weeks after your procedure. Drink enough fluids to keep your urine clear or pale yellow. Urinate as soon as you feel the urge to do so. Do not try to hold your urine for long periods of time. During recovery you may experience pain caused by bladder spasms, which result in a very intense urge to urinate. Take all medicines as directed by your caregiver, including medicines for pain. Try to limit the amount of pain medicines you take because it can cause constipation. If you do become constipated, do not strain to move your bowels. Straining can increase bleeding. Constipation can be minimized by increasing the amount fluids and fiber in your diet. Your caregiver also may prescribe a stool softener. Do not lift heavy objects (more than 5 lb [2.25 kg]) or perform exercises that cause you to strain for at least 1 month after your procedure. When sitting, you may want to sit in a soft chair or use a cushion. For the first 10 days after your procedure, avoid the following activities:  Running.  Strenuous work.  Long walks.  Riding in a car for extended periods.  Sex. SEEK MEDICAL CARE IF:  You have difficulty urinating.  You have blood in your urine that does not go away after you rest or increase your fluid intake.  You have swelling in your penis or scrotum. SEEK  IMMEDIATE MEDICAL CARE IF:   You are suddenly unable to urinate.  You notice blood clots in your urine.  You have chills.  You have a fever.  You have pain in your back or lower abdomen.  You have pain or swelling in your legs. MAKE SURE YOU:   Understand these  instructions.  Will watch your condition.  Will get help right away if you are not doing well or get worse. Document Released: 08/09/2005 Document Revised: 05/03/2012 Document Reviewed: 09/17/2011 [facility] Patient Information 2015 [place], [place]. This information is not intended to replace advice given to you by your health care provider. Make sure you discuss any questions you have with your health care provider.

## 2015-04-16 NOTE — Anesthesia Preprocedure Evaluation (Addendum)
Anesthesia Evaluation  Patient identified by MRN, date of birth, ID band Patient awake    Reviewed: Allergy & Precautions, NPO status , Patient's Chart, lab work & pertinent test results, reviewed documented beta blocker date and time   Airway Mallampati: III  TM Distance: >3 FB Neck ROM: Limited    Dental  (+) Upper Dentures, Partial Lower   Pulmonary shortness of breath and with exertion, former smoker,    Pulmonary exam normal       Cardiovascular hypertension, Pt. on medications and Pt. on home beta blockers + CAD Normal cardiovascular exam    Neuro/Psych  Neuromuscular disease negative psych ROS   GI/Hepatic Neg liver ROS, GERD-  Medicated and Controlled,  Endo/Other  negative endocrine ROS  Renal/GU negative Renal ROS  negative genitourinary   Musculoskeletal  (+) Arthritis -, Osteoarthritis,    Abdominal Normal abdominal exam  (+)   Peds negative pediatric ROS (+)  Hematology negative hematology ROS (+)   Anesthesia Other Findings   Reproductive/Obstetrics                            Anesthesia Physical Anesthesia Plan  ASA: III  Anesthesia Plan: General   Post-op Pain Management:    Induction: Intravenous  Airway Management Planned: Oral ETT  Additional Equipment:   Intra-op Plan:   Post-operative Plan: Extubation in OR  Informed Consent: I have reviewed the patients History and Physical, chart, labs and discussed the procedure including the risks, benefits and alternatives for the proposed anesthesia with the patient or authorized representative who has indicated his/her understanding and acceptance.   Dental advisory given  Plan Discussed with: CRNA and Surgeon  Anesthesia Plan Comments:        Anesthesia Quick Evaluation

## 2015-04-16 NOTE — Progress Notes (Signed)
Order received from Dr Kayleen Memos for Demerol for shaking.

## 2015-04-16 NOTE — Progress Notes (Signed)
Dr Kayleen Memos in to see pt [id number] starting again and family doesn't want pt to leave while shaking. Order received to check blood sugar.

## 2015-04-16 NOTE — H&P (Signed)
@ENCDATE @ 10:18 AM   Bryan Nelson 06-10-1932 [id number]  Referring provider: No referring provider defined for this encounter.  No chief complaint on file.   HPI: HPILong term vesical outlet obstruction 2nd to BPH   PMH: Past Medical History  Diagnosis Date  . Heart disease   . GERD (gastroesophageal reflux disease)   . Hyperlipidemia   . Arthritis   . Hearing loss   . Coronary artery disease   . Cancer 2015    Lymphoma    Surgical History: Past Surgical History  Procedure Laterality Date  . Heart stent placement      1998, 2000, 2012    Home Medications:    Medication List    ASK your doctor about these medications        aspirin 81 MG tablet  Take 81 mg by mouth daily.     atorvastatin 10 MG tablet  Commonly known as:  LIPITOR  Take 10 mg by mouth daily.     cefUROXime 250 MG tablet  Commonly known as:  CEFTIN  Take 1 tablet (250 mg total) by mouth 2 (two) times daily with a meal.     clopidogrel 75 MG tablet  Commonly known as:  PLAVIX  Take 75 mg by mouth once.     finasteride 5 MG tablet  Commonly known as:  PROSCAR  Take 5 mg by mouth daily.     hydrochlorothiazide 25 MG tablet  Commonly known as:  HYDRODIURIL  Take 25 mg by mouth daily.     isosorbide dinitrate 30 MG tablet  Commonly known as:  ISORDIL  Take 30 mg by mouth every morning.     metoprolol 100 MG tablet  Commonly known as:  LOPRESSOR  Take 100 mg by mouth 2 (two) times daily.     nitroGLYCERIN 0.4 MG SL tablet  Commonly known as:  NITROSTAT  Place 0.4 mg under the tongue every 5 (five) minutes as needed for chest pain.     omeprazole 40 MG capsule  Commonly known as:  PRILOSEC  Take 40 mg by mouth 2 (two) times daily.     ramipril 2.5 MG capsule  Commonly known as:  ALTACE  Take 2.5 mg by mouth daily.     sucralfate 1 G tablet  Commonly known as:  CARAFATE  Take 1 g by mouth 2 (two) times daily.     tamsulosin 0.4 MG Caps capsule  Commonly known as:   FLOMAX  Take 0.4 mg by mouth daily.     temazepam 30 MG capsule  Commonly known as:  RESTORIL  take 1 capsule by mouth at bedtime if needed     ZANTAC 150 MG tablet  Generic drug:  ranitidine  Take 150 mg by mouth 2 (two) times daily.        Allergies:  Allergies  Allergen Reactions  . Ciprofloxacin Diarrhea    Family History: History reviewed. No pertinent family history.  Social History:  reports that he quit smoking about 33 years ago. He has never used smokeless tobacco. He reports that he does not drink alcohol or use illicit drugs.  ROS:  GU: retention dribbling   GI No Nausea vomiting                                   Physical Exam: BP 126/64 mmHg  Pulse 52  Temp(Src) 97.6 F (36.4 C) (Oral)  Resp 16  SpO2 99%  Constitutional:  Alert and oriented, No acute distress. HEENT: [place] AT, moist mucus membranes.  Trachea midline, no masses. Cardiovascular: No clubbing, cyanosis, or edema. Respiratory: Normal respiratory effort, no increased work of breathing. GI: Abdomen is soft, nontender, nondistended, no abdominal masses GU: No CVA tenderness. Grade 3/4 BPH Skin: No rashes, bruises or suspicious lesions. Lymph: No cervical or inguinal adenopathy. Neurologic: Grossly intact, no focal deficits, moving all 4 extremities. Psychiatric: Normal mood and affect.  Laboratory Data: Lab Results  Component Value Date   WBC 5.5 04/10/2015   HGB 12.5* 04/10/2015   HCT 38.0* 04/10/2015   MCV 89.3 04/10/2015   PLT 205 04/10/2015    Lab Results  Component Value Date   CREATININE 1.11 04/10/2015    Lab Results  Component Value Date   PSA 0.9 02/27/2014    No results found for: TESTOSTERONE  No results found for: HGBA1C  Urinalysis    Component Value Date/Time   COLORURINE Yellow 08/22/2014 2005   APPEARANCEUR Turbid 08/22/2014 2005   LABSPEC 1.022 08/22/2014 2005   PHURINE 5.0 08/22/2014 2005   GLUCOSEU Negative 04/03/2015 1058    GLUCOSEU Negative 08/22/2014 2005   HGBUR 3+ 08/22/2014 2005   BILIRUBINUR Negative 04/03/2015 [place] Negative 08/22/2014 2005   KETONESUR Negative 08/22/2014 2005   PROTEINUR >=500 08/22/2014 2005   NITRITE Negative 04/03/2015 1058   NITRITE Negative 08/22/2014 2005   LEUKOCYTESUR 2+* 04/03/2015 1058   LEUKOCYTESUR 3+ 08/22/2014 2005    Pertinent Imaging: none  Assessment and Plan: BPH with obstruction Plan KPT laser prostatectomy      Problem List Items Addressed This Visit    None      No Follow-up on file.  Collier Flowers, [facility] [address], [place], [place] [address] [phone]

## 2015-04-16 NOTE — Progress Notes (Signed)
Pt decided he wanted to go home - states his hands are shaking a little but he wants to leave. Pt drinking sweet tea per Dr Kayleen Memos instruction.  Brief instruction given to pt and nephew re:leg bag use. Instruction given to use leg bag only during day if desired but regular foley bag needs to be used at night. Pt and nephew verbalized understanding and pt states he has had catheter before. Pt left department via wc condition stable.

## 2015-04-16 NOTE — Op Note (Signed)
Preoperative diagnosis:  1. Bladder outlet obstruction secondary to enlarged prostate   Postoperative diagnosis:  1. As above   Procedure:  1. Cystoscopy, greenlight laser ablation prostatectomy        2. Bladder biopsy fulguration Surgeon: Collier Flowers D.O.  Anesthesia: General   Complications: None  Intraoperative findings: Lateral lobe hypertrophy of the prostate with obstruction. Small bladder tumor left lateral wall. Lasing time: 65min18sec Joules:  72,810 Watts:  80 W-120W  EBL: Minimal  Specimens: None  Indication: DENA CAMMAROTA is a 81 y.o. patient with a 3-1/2 cm length prostate enlargement with obstruction.  After reviewing the management options for treatment, he elected to proceed with the above surgical procedure(s). We have discussed the potential benefits and risks of the procedure, side effects of the proposed treatment, the likelihood of the patient achieving the goals of the procedure, and any potential problems that might occur during the procedure or recuperation. Informed consent has been obtained.  Description of procedure: The patient was taken to the operating room and general anesthesia was induced.  The patient was placed in the dorsal lithotomy position, prepped and draped in the usual sterile fashion, and preoperative antibiotics were administered. A preoperative time-out was performed.   The patient was taken to the operating room and general anesthesia was induced. The patient was placed in the dorsal lithotomy position, prepped and draped in the usual sterile fashion, and preoperative antibiotics were administered. A preoperative time-out was performed.   A 22 French cystoscope was then gently passed to the patient's urethra with the visual obturator the laser bridge sheath. Visual obturator was then exchanged for the laser bridge. I then performed a routine laser ablative prostatectomy starting at 7:00 the bladder neck and ablating the tissue down to  the through the right prostatic apex creating a nice groove and delineating the margins of right lateral lobe. Then in a systematic fashion I ablated the tissues starting at 11:00 through 7:00 at the bladder neck working down to the apex. I then repeated this process on the right side starting at approximately 5:00 at the bladder neck and working down to the right prostatic apex. I then systematically ablated the left lateral lobe starting at 1:00 through 5:00 the bladder neck and working down to the apex. I then turned my attention to the median lobe and systematically ablated the median lobe taking care not to undermine the bladder neck and staying well away from the ureteral orifices.  Once an adequate channel had been created, all arterial bleeders were cauterized. Then I biopsied small flexible biopsy forceps a small bladder tumor in the left lateral wall. I fulgurated this with the Bugbee electrode as well as any bleeders in the prostatic fossa an 22 French Foley catheter was placed into the patient's urethra and the bladder irrigated until the effluent was clear. The patient was subsequently extubated and returned to the PACU in excellent condition. There no immediate complications.  Disposition:  The patient will be discharged home today with Foley catheter placed, and return in 2 days for a voiding trial.

## 2015-04-17 ENCOUNTER — Encounter: Payer: Self-pay | Admitting: Urology

## 2015-04-17 LAB — SURGICAL PATHOLOGY

## 2015-04-17 MED ORDER — EPHEDRINE SULFATE 50 MG/ML IJ SOLN
INTRAMUSCULAR | Status: DC | PRN
  Administered 2015-04-16: 5 mg via INTRAVENOUS

## 2015-04-17 NOTE — Anesthesia Procedure Notes (Signed)
Procedure Name: Intubation Date/Time: 04/16/2015 10:36 AM Performed by: Rosaria Ferries, Ramie Palladino Pre-anesthesia Checklist: Patient identified, Emergency Drugs available, Suction available and Patient being monitored Patient Re-evaluated:Patient Re-evaluated prior to inductionOxygen Delivery Method: Circle system utilized Preoxygenation: Pre-oxygenation with 100% oxygen Intubation Type: IV induction Laryngoscope Size: Mac and 3 Grade View: Grade I Tube type: Oral Tube size: 7.0 mm Number of attempts: 1 Placement Confirmation: ETT inserted through vocal cords under direct vision,  positive ETCO2 and breath sounds checked- equal and bilateral Secured at: 23 cm Tube secured with: Tape Dental Injury: Teeth and Oropharynx as per pre-operative assessment

## 2015-04-17 NOTE — Addendum Note (Signed)
Addendum  created 04/17/15 1629 by Bernardo Heater, CRNA   Modules edited: Anesthesia Blocks and Procedures, Anesthesia Medication Administration, Clinical Notes   Clinical Notes:  File: [id number]

## 2015-04-18 ENCOUNTER — Encounter: Payer: Self-pay | Admitting: Urology

## 2015-04-18 ENCOUNTER — Ambulatory Visit (INDEPENDENT_AMBULATORY_CARE_PROVIDER_SITE_OTHER): Payer: Medicare Other | Admitting: Urology

## 2015-04-18 VITALS — BP 128/68 | HR 54 | Ht 73.0 in | Wt 252.5 lb

## 2015-04-18 DIAGNOSIS — N400 Enlarged prostate without lower urinary tract symptoms: Secondary | ICD-10-CM

## 2015-04-18 NOTE — Progress Notes (Signed)
Patient's postop follow-up 2 days. He had a laser prostatectomy on Monday. Urine is clear today and both tubing in the bag. He had a biopsy of his bladder that time and there was no tumor mass in that area of the biopsy by pathology report. This was reviewed with the patient and son. He caths at home and had been cathing occasionally for retention. He was able to void spontaneously sometimes. Now with his prostate removed he should void very well. He has complained today of some back pain is on the left side unrelated to the surgery mid back. He's never had a stone is unrelated to this type of thing I believe. He can take his oxycodone for this back pain. He is having no bladder pain and no spasms. I'll see him in a month follow-up. Formal noted that

## 2015-04-18 NOTE — Progress Notes (Signed)
Catheter Removal  Patient is present today for a catheter removal.  76ml of water was drained from the balloon. A 22FR foley cath was removed from the bladder no complications were noted . Patient tolerated well.  Preformed by: Toniann Fail, LPN

## 2015-04-21 ENCOUNTER — Telehealth: Payer: Self-pay | Admitting: Urology

## 2015-04-21 NOTE — Telephone Encounter (Signed)
Patient called stating that he had to cath after leaving the office on Friday after his foley catheter was removed. He has had to CIC a few times a day over the weekend. He states that there is some resistance when cathing but no pain or blood, he is post TURP there may be swelling present which may be why he is needing to cath. He was told to come and pick up samples for coude I&O caths to try and see if this is better. It was explained to the patient that he should not ever force the cath in and if he has blood or pain with cathing to call us for re-evaluation.

## 2015-04-22 ENCOUNTER — Ambulatory Visit (INDEPENDENT_AMBULATORY_CARE_PROVIDER_SITE_OTHER): Payer: Medicare Other

## 2015-04-22 DIAGNOSIS — N400 Enlarged prostate without lower urinary tract symptoms: Secondary | ICD-10-CM | POA: Diagnosis not present

## 2015-04-22 NOTE — Progress Notes (Signed)
Continuous Intermittent Catheterization  Due to incomplete bladder emptying patient is present today for a review teaching of I & O Catheterization. Patient is almost a week post TURP and is having trouble with urination possible swelling still from surgery. Patient called this morning stating after picking up samples yesterday to try the coude cath he has had some blood and some pain.  He was asked to come in so that we could observe him cathing to make sure the coude tip was pointed up and not causing and trama to the urethra. Patient was cleaned and prepped in a sterile fashion.  With instruction and assistance patient inserted a 16FR coude and urine return was noted 150 ml, urine was dark orange in color. Patient tolerated well, no complications were noted Patient cathed with out difficulty and no blood was noted but there was a small clot seen this is normal for being less than a week post op. Patient was told to continue to monitor symptoms which should get better with in the next few weeks, so that his body has time to heal. If symptoms worsen he is to call back for further evaluation.   Preformed by: Fonnie Jarvis, CMA

## 2015-04-30 ENCOUNTER — Telehealth: Payer: Self-pay | Admitting: Urology

## 2015-04-30 NOTE — Telephone Encounter (Signed)
Patient called stating that he is still having to cath post TURP and is seeing some small blood clots when he caths sometimes, I explained to the patient that still seeing blood only 2 weeks post TURP can be normal and especially so with the CIC daily which can sometimes irritate the urinary tract.  I explained that you can still bleed 6-8 weeks post TURP and to keep his follow up with Dr. Elnoria Howard in October. If he starts to fill the toilet bowl up with thick clotted blood or has any pain or resistance when cathing he is to call back for further evaluation.

## 2015-05-01 ENCOUNTER — Telehealth: Payer: Self-pay

## 2015-05-01 NOTE — Telephone Encounter (Signed)
Message left on vm that he is having blood in his urine. Nurse called pt who stated he received a return phone call yesterday. Pt also stated since then the blood has subsided but is still having to do CIC. Pt denied any pain with CIC.

## 2015-05-01 NOTE — Telephone Encounter (Signed)
Patient called in the late afternoon on 04/30/2015 and is requesting a call back from the nurse.

## 2015-05-08 DIAGNOSIS — H3531 Nonexudative age-related macular degeneration: Secondary | ICD-10-CM | POA: Diagnosis not present

## 2015-05-19 ENCOUNTER — Other Ambulatory Visit: Payer: Medicare Other

## 2015-05-19 ENCOUNTER — Telehealth: Payer: Self-pay | Admitting: Urology

## 2015-05-19 ENCOUNTER — Other Ambulatory Visit: Payer: Self-pay | Admitting: Urology

## 2015-05-19 ENCOUNTER — Telehealth: Payer: Self-pay

## 2015-05-19 DIAGNOSIS — N390 Urinary tract infection, site not specified: Secondary | ICD-10-CM

## 2015-05-19 MED ORDER — SULFAMETHOXAZOLE-TRIMETHOPRIM 800-160 MG PO TABS
1.0000 | ORAL_TABLET | Freq: Two times a day (BID) | ORAL | Status: DC
Start: 2015-05-19 — End: 2015-05-27

## 2015-05-19 NOTE — Telephone Encounter (Signed)
Spoke with pt and made aware Larene Beach called in an abx to his pharmacy for him to pick up. Reinforced with pt when ucx results are back we may need to change the abx. Pt voiced understanding.

## 2015-05-19 NOTE — Telephone Encounter (Signed)
Spoke with pt who stated he is having to cath every 2 hours and even up 2-3x a night to cath. Pt is on the way to give a cath specimen now.

## 2015-05-19 NOTE — Telephone Encounter (Signed)
Pt is getting up multiple times during the day and night and is wondering if something is going on with his catheter, ? Infection.  Please call [phone]

## 2015-05-20 DIAGNOSIS — I10 Essential (primary) hypertension: Secondary | ICD-10-CM | POA: Diagnosis not present

## 2015-05-20 DIAGNOSIS — I2510 Atherosclerotic heart disease of native coronary artery without angina pectoris: Secondary | ICD-10-CM | POA: Diagnosis not present

## 2015-05-20 LAB — URINALYSIS, COMPLETE
Bilirubin, UA: NEGATIVE
GLUCOSE, UA: NEGATIVE
Ketones, UA: NEGATIVE
NITRITE UA: POSITIVE — AB
Specific Gravity, UA: 1.02 (ref 1.005–1.030)
Urobilinogen, Ur: 0.2 mg/dL (ref 0.2–1.0)
pH, UA: 5.5 (ref 5.0–7.5)

## 2015-05-20 LAB — MICROSCOPIC EXAMINATION: WBC, UA: >30 /hpf — ABNORMAL HIGH (ref 0–?)

## 2015-05-22 ENCOUNTER — Other Ambulatory Visit: Payer: Self-pay

## 2015-05-22 ENCOUNTER — Telehealth: Payer: Self-pay

## 2015-05-22 DIAGNOSIS — N390 Urinary tract infection, site not specified: Secondary | ICD-10-CM

## 2015-05-22 LAB — CULTURE, URINE COMPREHENSIVE

## 2015-05-22 NOTE — Telephone Encounter (Signed)
-----   Message from Nori Riis, PA-C sent at 05/22/2015  1:06 PM EDT ----- Patient's urine culture returned positive. He should continue the Septra DS. After he completes his antibiotic, is to bring in a catheter specimen for recheck.

## 2015-05-22 NOTE — Telephone Encounter (Signed)
Spoke with pt in reference +ucx. Pt stated he has an appt 05/27/15. Made pt aware at that appt we will need to run another urine cx. Pt voiced understanding.

## 2015-05-27 ENCOUNTER — Ambulatory Visit (INDEPENDENT_AMBULATORY_CARE_PROVIDER_SITE_OTHER): Payer: Medicare Other | Admitting: Urology

## 2015-05-27 ENCOUNTER — Encounter: Payer: Self-pay | Admitting: Urology

## 2015-05-27 VITALS — BP 113/67 | HR 56 | Ht 73.0 in | Wt 249.2 lb

## 2015-05-27 DIAGNOSIS — N390 Urinary tract infection, site not specified: Secondary | ICD-10-CM

## 2015-05-27 DIAGNOSIS — R339 Retention of urine, unspecified: Secondary | ICD-10-CM

## 2015-05-27 LAB — URINALYSIS, COMPLETE
BILIRUBIN UA: NEGATIVE
Glucose, UA: NEGATIVE
KETONES UA: NEGATIVE
NITRITE UA: NEGATIVE
SPEC GRAV UA: 1.015 (ref 1.005–1.030)
Urobilinogen, Ur: 0.2 mg/dL (ref 0.2–1.0)
pH, UA: 6 (ref 5.0–7.5)

## 2015-05-27 LAB — MICROSCOPIC EXAMINATION: WBC, UA: >30 /hpf — ABNORMAL HIGH (ref 0–?)

## 2015-05-27 MED ORDER — NITROFURANTOIN MONOHYD MACRO 100 MG PO CAPS: 100.0000 mg | ORAL_CAPSULE | Freq: Every day | ORAL | Status: DC

## 2015-05-27 NOTE — Progress Notes (Signed)
05/27/2015 10:29 AM   Bryan Nelson 08-12-1932 [id number]  Referring provider: [name], MD [address] [place], [place] [address]  No chief complaint on file.   HPI: postop KPT laser 1 month. Surgery went well with no difficulties. He still cathing himself although he occasionally void spontaneously if he waits long enough. This waiting period about 3 hours. He does last cath himself every 3-4 hours as he has a sense of urgency. He is also had a, current UTI which is not unexpected as he had had a suprapubic catheter for long because of his urinary retention is unknown at this point although he had what obstruction was quite severe. The obstruction is gone. Because of this we can consider other therapies if he remains in retention I have explained this to the patient at length. HPI   PMH: Past Medical History  Diagnosis Date  . Heart disease   . GERD (gastroesophageal reflux disease)   . Hyperlipidemia   . Arthritis   . Hearing loss   . Coronary artery disease   . Cancer [facility]) 2015    Lymphoma    Surgical History: Past Surgical History  Procedure Laterality Date  . Heart stent placement      1998, 2000, 2012  . Green light laser turp (transurethral resection of prostate N/A 04/16/2015    Procedure: GREEN LIGHT LASER TURP (TRANSURETHRAL RESECTION OF PROSTATE WITH BLADDER BIOPSY;  Surgeon: Collier Flowers, MD;  Location: [facility] ORS;  Service: Urology;  Laterality: N/A;    Home Medications:    Medication List       This list is accurate as of: 05/27/15 10:29 AM.  Always use your most recent med list.               aspirin 81 MG tablet  Take 81 mg by mouth daily.     atorvastatin 10 MG tablet  Commonly known as:  LIPITOR  Take 10 mg by mouth daily.     cefUROXime 250 MG tablet  Commonly known as:  CEFTIN  Take 1 tablet (250 mg total) by mouth 2 (two) times daily with a meal.     clopidogrel 75 MG tablet  Commonly known as:  PLAVIX  Take 75  mg by mouth once.     finasteride 5 MG tablet  Commonly known as:  PROSCAR  Take 5 mg by mouth daily.     hydrochlorothiazide 25 MG tablet  Commonly known as:  HYDRODIURIL  Take 25 mg by mouth daily.     isosorbide dinitrate 30 MG tablet  Commonly known as:  ISORDIL  Take 30 mg by mouth every morning.     lidocaine 2 % jelly  Commonly known as:  XYLOCAINE     metoprolol 100 MG tablet  Commonly known as:  LOPRESSOR  Take 100 mg by mouth 2 (two) times daily.     nitrofurantoin (macrocrystal-monohydrate) 100 MG capsule  Commonly known as:  MACROBID  Take 1 capsule (100 mg total) by mouth at bedtime.     nitroGLYCERIN 0.4 MG SL tablet  Commonly known as:  NITROSTAT  Place 0.4 mg under the tongue every 5 (five) minutes as needed for chest pain.     omeprazole 40 MG capsule  Commonly known as:  PRILOSEC  Take 40 mg by mouth 2 (two) times daily.     oxyCODONE-acetaminophen 5-325 MG tablet  Commonly known as:  ROXICET  Take 1 tablet by mouth every 6 (  six) hours as needed for severe pain.     ramipril 2.5 MG capsule  Commonly known as:  ALTACE  Take 2.5 mg by mouth daily.     sucralfate 1 G tablet  Commonly known as:  CARAFATE  Take 1 g by mouth 2 (two) times daily.     tamsulosin 0.4 MG Caps capsule  Commonly known as:  FLOMAX  Take 0.4 mg by mouth daily.     temazepam 30 MG capsule  Commonly known as:  RESTORIL  take 1 capsule by mouth at bedtime if needed     ZANTAC 150 MG tablet  Generic drug:  ranitidine  Take 150 mg by mouth 2 (two) times daily.        Allergies:  Allergies  Allergen Reactions  . Ciprofloxacin Diarrhea  . Sulfa Antibiotics Itching and Rash    Rash    Family History: No family history on file.  Social History:  reports that he quit smoking about 33 years ago. He has never used smokeless tobacco. He reports that he does not drink alcohol or use illicit drugs.  ROS: UROLOGY Frequent Urination?: No Hard to postpone urination?:  No Burning/pain with urination?: No Get up at night to urinate?: No Leakage of urine?: No Urine stream starts and stops?: Yes Trouble starting stream?: Yes Do you have to strain to urinate?: No Blood in urine?: No Urinary tract infection?: Yes Sexually transmitted disease?: No Injury to kidneys or bladder?: No Painful intercourse?: No Weak stream?: No Erection problems?: No Penile pain?: No  Gastrointestinal Nausea?: No Vomiting?: No Indigestion/heartburn?: No Diarrhea?: No Constipation?: No  Constitutional Fever: No Night sweats?: No Weight loss?: No Fatigue?: No  Skin Skin rash/lesions?: Yes Itching?: Yes  Eyes Blurred vision?: No Double vision?: No  Ears/Nose/Throat Sore throat?: No Sinus problems?: No  Hematologic/Lymphatic Swollen glands?: No Easy bruising?: No  Cardiovascular Leg swelling?: No Chest pain?: No  Respiratory Cough?: No Shortness of breath?: No  Endocrine Excessive thirst?: No  Musculoskeletal Back pain?: No Joint pain?: No  Neurological Headaches?: No Dizziness?: No  Psychologic Depression?: No Anxiety?: No  Physical Exam: BP 113/67 mmHg  Pulse 56  Ht 6\' 1"  (1.854 m)  Wt 249 lb 3.2 oz (113.036 kg)  BMI 32.88 kg/m2  Constitutional:  Alert and oriented, No acute distress. HEENT: [place] AT, moist mucus membranes.  Trachea midline, no masses. Cardiovascular: No clubbing, cyanosis, or edema. Respiratory: Normal respiratory effort, no increased work of breathing. GI: Abdomen is soft, nontender, nondistended, no abdominal masses GU: No CVA tenderness. Penis and testes normal months  circumcised Skin: No rashes, bruises or suspicious lesions. Lymph: No cervical or inguinal adenopathy. Neurologic: Grossly intact, no focal deficits, moving all 4 extremities. Psychiatric: Normal mood and affect.  Laboratory Data: Lab Results  Component Value Date   WBC 5.5 04/10/2015   HGB 12.5* 04/10/2015   HCT 38.0* 04/10/2015   MCV  89.3 04/10/2015   PLT 205 04/10/2015    Lab Results  Component Value Date   CREATININE 1.11 04/10/2015    Lab Results  Component Value Date   PSA 0.9 02/27/2014    No results found for: TESTOSTERONE  No results found for: HGBA1C  Urinalysis    Component Value Date/Time   COLORURINE Yellow 08/22/2014 2005   APPEARANCEUR Turbid 08/22/2014 2005   LABSPEC 1.022 08/22/2014 2005   PHURINE 5.0 08/22/2014 2005   GLUCOSEU Negative 05/19/2015 1651   GLUCOSEU Negative 08/22/2014 2005   HGBUR 3+ 08/22/2014 2005  BILIRUBINUR Negative 05/19/2015 [place] Negative 08/22/2014 2005   KETONESUR Negative 08/22/2014 2005   PROTEINUR >=500 08/22/2014 2005   NITRITE Positive* 05/19/2015 1651   NITRITE Negative 08/22/2014 2005   LEUKOCYTESUR 1+* 05/19/2015 1651   LEUKOCYTESUR 3+ 08/22/2014 2005    Pertinent Imaging:  none  Assessment and Plan:  Post KPT laser patient would long-term I lead obstruction with previous suprapubic catheter. Had a large prostate with very large middle lobe or median lobe hypertrophy. Obstruction is now gone but the patient is still unable to void V waits 3 hours he has some voiding and then has to cath himself. Complicating this is he had a easily with a self cath. We to Bactrim and set his rash went away with started Bactrim but because it was a rash associated with going to put him on nitrofurantoin long-term was cathing himself. I suspect he'll started voiding soon but if he continues with retention he should be Sedated for InterStim therapy at least a peripheral nerve evaluation to see if we can get him voiding on sacral nerve stimulation follow-up is in a month      Problem List Items Addressed This Visit    None    Visit Diagnoses    Urinary tract infection without hematuria, site unspecified    -  Primary    Relevant Medications    nitrofurantoin, macrocrystal-monohydrate, (MACROBID) 100 MG capsule    Other Relevant Orders    Urinalysis,  Complete    CULTURE, URINE COMPREHENSIVE       No Follow-up on file.  Collier Flowers, [facility] [address], [place],  [address] [phone]

## 2015-05-31 LAB — CULTURE, URINE COMPREHENSIVE

## 2015-06-09 ENCOUNTER — Inpatient Hospital Stay: Payer: Medicare Other | Attending: Oncology

## 2015-06-09 DIAGNOSIS — N400 Enlarged prostate without lower urinary tract symptoms: Secondary | ICD-10-CM | POA: Diagnosis not present

## 2015-06-09 DIAGNOSIS — C801 Malignant (primary) neoplasm, unspecified: Secondary | ICD-10-CM

## 2015-06-09 MED ORDER — SODIUM CHLORIDE 0.9 % IJ SOLN
10.0000 mL | INTRAMUSCULAR | Status: DC | PRN
  Administered 2015-06-09: 10 mL
  Filled 2015-06-09: qty 10

## 2015-06-09 MED ORDER — HEPARIN SOD (PORK) LOCK FLUSH 100 UNIT/ML IV SOLN
500.0000 [IU] | Freq: Once | INTRAVENOUS | Status: AC
  Administered 2015-06-09: 500 [IU] via INTRAVENOUS
  Filled 2015-06-09: qty 5

## 2015-06-18 ENCOUNTER — Encounter: Payer: Self-pay | Admitting: Family Medicine

## 2015-06-18 ENCOUNTER — Ambulatory Visit (INDEPENDENT_AMBULATORY_CARE_PROVIDER_SITE_OTHER): Payer: Medicare Other | Admitting: Family Medicine

## 2015-06-18 VITALS — BP 116/62 | HR 72 | Temp 98.3°F | Resp 16 | Wt 252.0 lb

## 2015-06-18 DIAGNOSIS — M7550 Bursitis of unspecified shoulder: Secondary | ICD-10-CM | POA: Insufficient documentation

## 2015-06-18 DIAGNOSIS — Z860100 Personal history of colon polyps, unspecified: Secondary | ICD-10-CM

## 2015-06-18 DIAGNOSIS — Z23 Encounter for immunization: Secondary | ICD-10-CM | POA: Diagnosis not present

## 2015-06-18 DIAGNOSIS — A048 Other specified bacterial intestinal infections: Secondary | ICD-10-CM

## 2015-06-18 DIAGNOSIS — E559 Vitamin D deficiency, unspecified: Secondary | ICD-10-CM | POA: Insufficient documentation

## 2015-06-18 DIAGNOSIS — G4700 Insomnia, unspecified: Secondary | ICD-10-CM | POA: Insufficient documentation

## 2015-06-18 DIAGNOSIS — J069 Acute upper respiratory infection, unspecified: Secondary | ICD-10-CM

## 2015-06-18 DIAGNOSIS — K219 Gastro-esophageal reflux disease without esophagitis: Secondary | ICD-10-CM | POA: Insufficient documentation

## 2015-06-18 DIAGNOSIS — E7849 Other hyperlipidemia: Secondary | ICD-10-CM | POA: Insufficient documentation

## 2015-06-18 DIAGNOSIS — M9979 Connective tissue and disc stenosis of intervertebral foramina of abdomen and other regions: Secondary | ICD-10-CM | POA: Insufficient documentation

## 2015-06-18 DIAGNOSIS — B852 Pediculosis, unspecified: Secondary | ICD-10-CM | POA: Insufficient documentation

## 2015-06-18 DIAGNOSIS — K5792 Diverticulitis of intestine, part unspecified, without perforation or abscess without bleeding: Secondary | ICD-10-CM | POA: Insufficient documentation

## 2015-06-18 HISTORY — DX: Other specified bacterial intestinal infections: A04.8

## 2015-06-18 HISTORY — DX: Personal history of colonic polyps: Z86.010

## 2015-06-18 HISTORY — DX: Personal history of colon polyps, unspecified: Z86.0100

## 2015-06-18 MED ORDER — FLUTICASONE PROPIONATE 50 MCG/ACT NA SUSP: 2.0000 | Freq: Every day | NASAL | Status: DC

## 2015-06-18 NOTE — Progress Notes (Signed)
Patient: Bryan Nelson Male    DOB: 10-17-1931   81 y.o.   MRN: [id number] Visit Date: 06/18/2015  Today's Provider: Lelon Huh, MD   Chief Complaint  Patient presents with  . URI   Subjective:    URI  This is a new problem. Episode onset: started 2-3 days ago. The problem has been gradually improving. There has been no fever. Associated symptoms include coughing, rhinorrhea (blowing out yellow mucus), sneezing, a sore throat and wheezing (when coughing ). Pertinent negatives include no abdominal pain, chest pain, congestion, diarrhea, dysuria, ear pain, headaches, joint pain, joint swelling, nausea, neck pain, plugged ear sensation, rash, sinus pain, swollen glands or vomiting. Treatments tried: OTC Tylenol Cold medication. The treatment provided mild relief.       Allergies  Allergen Reactions  . Ciprofloxacin Diarrhea  . Lisinopril Other (See Comments)  . Penicillins     unknwon reaction.  tolerates amoxicillin  . Sulfa Antibiotics Itching and Rash    Rash   Previous Medications   ASPIRIN 81 MG TABLET    Take 81 mg by mouth daily.   ATORVASTATIN (LIPITOR) 10 MG TABLET    Take 10 mg by mouth daily.   CYANOCOBALAMIN 1000 MCG TABLET    Take by mouth.   FINASTERIDE (PROSCAR) 5 MG TABLET    Take 5 mg by mouth daily.   HYDROCHLOROTHIAZIDE (HYDRODIURIL) 25 MG TABLET    Take 25 mg by mouth daily.   ISOSORBIDE DINITRATE (ISORDIL) 30 MG TABLET    Take 30 mg by mouth every morning.   METOPROLOL (LOPRESSOR) 100 MG TABLET    Take 100 mg by mouth 2 (two) times daily.   NITROGLYCERIN (NITROSTAT) 0.4 MG SL TABLET    Place 0.4 mg under the tongue every 5 (five) minutes as needed for chest pain.   OMEPRAZOLE (PRILOSEC) 40 MG CAPSULE    Take 40 mg by mouth 2 (two) times daily.   RAMIPRIL (ALTACE) 2.5 MG CAPSULE    Take 2.5 mg by mouth daily.   RANITIDINE (ZANTAC) 150 MG TABLET    Take 150 mg by mouth 2 (two) times daily.   SUCRALFATE (CARAFATE) 1 G TABLET    Take 1 g by mouth 2  (two) times daily.   TADALAFIL (CIALIS) 5 MG TABLET    Take by mouth.   TAMSULOSIN (FLOMAX) 0.4 MG CAPS CAPSULE    Take 0.4 mg by mouth daily.   TEMAZEPAM (RESTORIL) 30 MG CAPSULE    take 1 capsule by mouth at bedtime if needed   ZOLPIDEM (AMBIEN) 10 MG TABLET    Take by mouth.    Review of Systems  Constitutional: Negative for fever, chills, diaphoresis, appetite change and fatigue.  HENT: Positive for postnasal drip, rhinorrhea (blowing out yellow mucus), sinus pressure, sneezing and sore throat. Negative for congestion, ear discharge, ear pain, trouble swallowing and voice change.   Eyes: Negative for photophobia, pain, discharge, redness and itching.  Respiratory: Positive for cough and wheezing (when coughing ). Negative for chest tightness and shortness of breath.   Cardiovascular: Negative for chest pain and palpitations.  Gastrointestinal: Negative for nausea, vomiting, abdominal pain and diarrhea.  Genitourinary: Negative for dysuria.  Musculoskeletal: Negative for joint pain and neck pain.  Skin: Negative for rash.  Neurological: Positive for dizziness and light-headedness. Negative for headaches.    Social History  Substance Use Topics  . Smoking status: Former Smoker -- 1.50 packs/day for 12 years  Types: Cigarettes    Quit date: 08/23/1981  . Smokeless tobacco: Never Used  . Alcohol Use: No   Objective:   BP 116/62 mmHg  Pulse 72  Temp(Src) 98.3 F (36.8 C) (Oral)  Resp 16  Wt 252 lb (114.306 kg)  SpO2 96%  Physical Exam  General Appearance:    Alert, cooperative, no distress  HENT:   neck without nodes, throat normal without erythema or exudate, sinuses nontender and nasal mucosa pale and congested  Eyes:    PERRL, conjunctiva/corneas clear, EOM's intact       Lungs:     Clear to auscultation bilaterally, respirations unlabored  Heart:    Regular rate and rhythm  Neurologic:   Awake, alert, oriented x 3. No apparent focal neurological           defect.            Assessment & Plan:     1. Upper respiratory infection Counseled regarding sign and symptoms of viral and bacterial respiratory infections. Advised to call or return for additional evaluation if he develops any sign of bacterial infection, or if current symptoms last longer than 10 days.  May also have some underlying allergies.   - fluticasone (FLONASE) 50 MCG/ACT nasal spray; Place 2 sprays into both nostrils daily.  Dispense: 16 g; Refill: 6  2. Need for influenza vaccination  - Flu vaccine HIGH DOSE PF (Fluzone High dose)       Lelon Huh, MD  [facility]

## 2015-06-18 NOTE — Patient Instructions (Signed)
Recommend taking OTC Guaifenesin for mucous in chest and sinuses

## 2015-06-19 ENCOUNTER — Encounter: Payer: Self-pay | Admitting: Family Medicine

## 2015-06-24 ENCOUNTER — Ambulatory Visit (INDEPENDENT_AMBULATORY_CARE_PROVIDER_SITE_OTHER): Payer: Medicare Other

## 2015-06-24 DIAGNOSIS — N390 Urinary tract infection, site not specified: Secondary | ICD-10-CM

## 2015-06-24 DIAGNOSIS — R339 Retention of urine, unspecified: Secondary | ICD-10-CM | POA: Diagnosis not present

## 2015-06-24 LAB — URINALYSIS, COMPLETE
Bilirubin, UA: NEGATIVE
GLUCOSE, UA: NEGATIVE
Ketones, UA: NEGATIVE
NITRITE UA: POSITIVE — AB
PH UA: 6 (ref 5.0–7.5)
Specific Gravity, UA: 1.02 (ref 1.005–1.030)
UUROB: 0.2 mg/dL (ref 0.2–1.0)

## 2015-06-24 LAB — MICROSCOPIC EXAMINATION
Epithelial Cells (non renal): NONE SEEN /hpf (ref 0–10)
Renal Epithel, UA: NONE SEEN /hpf

## 2015-06-24 LAB — BLADDER SCAN AMB NON-IMAGING: SCAN RESULT: 98

## 2015-06-24 NOTE — Progress Notes (Signed)
In and Out Catheterization  Patient is present today for a I & O catheterization due to possible UTI. Patient was cleaned and prepped in a sterile fashion with betadine and Lidocaine 2% jelly was instilled into the urethra.  A 14FR cath was inserted no complications were noted , 264ml of urine return was noted, urine was cloudy, concentrated, and yellow in color. A clean urine sample was collected for u/a & cx. Bladder was drained  And catheter was removed with out difficulty.    Preformed by: Toniann Fail, LPN   Follow up/ Additional notes: Pt called c/o frequency, urgency, and burning on urination. Pt was asked to give a sample. Pt was unable to void therefore an in and out cath was performed. PVR showed 98. Pt stated he is currently taking macrobid daily.

## 2015-06-25 ENCOUNTER — Telehealth: Payer: Self-pay | Admitting: Urology

## 2015-06-25 NOTE — Telephone Encounter (Signed)
Spoke with pt in reference to u/a and cx. Per Ria Comment pt should take macrobid bid until cx results are back. Pt voiced understanding.

## 2015-06-25 NOTE — Telephone Encounter (Signed)
Pt LM on voice mail.  "No one has called me back about my test results.  Can someone please call me."  [phone].

## 2015-06-27 ENCOUNTER — Encounter: Payer: Self-pay | Admitting: Urology

## 2015-06-27 ENCOUNTER — Ambulatory Visit: Payer: Medicare Other | Admitting: Urology

## 2015-06-27 VITALS — BP 124/68 | HR 72 | Ht 73.0 in | Wt 249.0 lb

## 2015-06-27 DIAGNOSIS — R350 Frequency of micturition: Secondary | ICD-10-CM

## 2015-06-27 DIAGNOSIS — N390 Urinary tract infection, site not specified: Secondary | ICD-10-CM

## 2015-06-27 LAB — CULTURE, URINE COMPREHENSIVE

## 2015-06-27 LAB — BLADDER SCAN AMB NON-IMAGING: SCAN RESULT: 332

## 2015-06-27 NOTE — Progress Notes (Signed)
06/27/2015 1:54 PM   Bryan Nelson 03/02/1932 [id number]  Referring provider: [name], MD [address] [place], [place] [address]  Chief Complaint  Patient presents with  . Routine Post Op    KTP     HPI: Patient with a residual of 300 but he hasn't voided since last night so he is not drinking very much water. The last time he last night so in the course of the day he's only put out 300 mL of urine. No one he can't void well in addition to this he has a positive culture for pseudomonas placed him on Cipro 500 mg twice a day for 10 days and we'll see     PMH: Past Medical History  Diagnosis Date  . Heart disease   . Cancer ([place]) 2015    Lymphoma  . Helicobacter pylori infection 06/18/2015    by EGD RX 08/18/1999   . History of adenomatous polyp of colon 06/18/2015    Surgical History: Past Surgical History  Procedure Laterality Date  . Heart stent placement      1998, 2000, 2012  . Green light laser turp (transurethral resection of prostate N/A 04/16/2015    Procedure: GREEN LIGHT LASER TURP (TRANSURETHRAL RESECTION OF PROSTATE WITH BLADDER BIOPSY;  Surgeon: Collier Flowers, MD;  Location: [facility] ORS;  Service: Urology;  Laterality: N/A;  . Angioplasty    . Cataract extraction      Home Medications:    Medication List       This list is accurate as of: 06/27/15  1:54 PM.  Always use your most recent med list.               aspirin 81 MG tablet  Take 81 mg by mouth daily.     atorvastatin 10 MG tablet  Commonly known as:  LIPITOR  Take 10 mg by mouth daily.     CIALIS 5 MG tablet  Generic drug:  tadalafil  Take by mouth.     cyanocobalamin 1000 MCG tablet  Take by mouth.     finasteride 5 MG tablet  Commonly known as:  PROSCAR  Take 5 mg by mouth daily.     fluticasone 50 MCG/ACT nasal spray  Commonly known as:  FLONASE  Place 2 sprays into both nostrils daily.     hydrochlorothiazide 25 MG tablet  Commonly known as:   HYDRODIURIL  Take 25 mg by mouth daily.     isosorbide dinitrate 30 MG tablet  Commonly known as:  ISORDIL  Take 30 mg by mouth every morning.     metoprolol 100 MG tablet  Commonly known as:  LOPRESSOR  Take 100 mg by mouth 2 (two) times daily.     nitrofurantoin (macrocrystal-monohydrate) 100 MG capsule  Commonly known as:  MACROBID  Take 100 mg by mouth 2 (two) times daily.     nitroGLYCERIN 0.4 MG SL tablet  Commonly known as:  NITROSTAT  Place 0.4 mg under the tongue every 5 (five) minutes as needed for chest pain.     omeprazole 40 MG capsule  Commonly known as:  PRILOSEC  Take 40 mg by mouth 2 (two) times daily.     ramipril 2.5 MG capsule  Commonly known as:  ALTACE  Take 2.5 mg by mouth daily.     sucralfate 1 G tablet  Commonly known as:  CARAFATE  Take 1 g by mouth 2 (two) times daily.     tamsulosin  0.4 MG Caps capsule  Commonly known as:  FLOMAX  Take 0.4 mg by mouth daily.     temazepam 30 MG capsule  Commonly known as:  RESTORIL  take 1 capsule by mouth at bedtime if needed     ZANTAC 150 MG tablet  Generic drug:  ranitidine  Take 150 mg by mouth 2 (two) times daily.     zolpidem 10 MG tablet  Commonly known as:  AMBIEN  Take by mouth.        Allergies:  Allergies  Allergen Reactions  . Ciprofloxacin Diarrhea  . Lisinopril Other (See Comments)  . Penicillins     unknwon reaction.  tolerates amoxicillin  . Sulfa Antibiotics Itching and Rash    Rash    Family History: Family History  Problem Relation Age of Onset  . Osteoporosis Mother   . Alzheimer's disease Father     Social History:  reports that he quit smoking about 33 years ago. His smoking use included Cigarettes. He has a 18 pack-year smoking history. He has never used smokeless tobacco. He reports that he does not drink alcohol or use illicit drugs.  ROS: UROLOGY Frequent Urination?: Yes Hard to postpone urination?: No Burning/pain with urination?: Yes Get up at night to  urinate?: Yes Leakage of urine?: No Urine stream starts and stops?: No Trouble starting stream?: No Do you have to strain to urinate?: No Blood in urine?: No Urinary tract infection?: Yes Sexually transmitted disease?: No Injury to kidneys or bladder?: No Painful intercourse?: No Weak stream?: No Erection problems?: No Penile pain?: No  Gastrointestinal Nausea?: No Vomiting?: No Indigestion/heartburn?: No Diarrhea?: No Constipation?: No  Constitutional Fever: No Night sweats?: No Weight loss?: No Fatigue?: No  Skin Skin rash/lesions?: No Itching?: No  Eyes Blurred vision?: No Double vision?: No  Ears/Nose/Throat Sore throat?: No Sinus problems?: No  Hematologic/Lymphatic Swollen glands?: No Easy bruising?: No  Cardiovascular Leg swelling?: No Chest pain?: No  Respiratory Cough?: Yes Shortness of breath?: No  Endocrine Excessive thirst?: No  Musculoskeletal Back pain?: No Joint pain?: No  Neurological Headaches?: No Dizziness?: No  Psychologic Depression?: No (n) Anxiety?: No  Physical Exam: BP 124/68 mmHg  Pulse 72  Ht 6\' 1"  (1.854 m)  Wt 249 lb (112.946 kg)  BMI 32.86 kg/m2  Constitutional:  Alert and oriented, No acute distress. Depression HEENT: [place] AT, moist mucus membranes.  Trachea midline, no masses. Cardiovascular: No clubbing, cyanosis, or edema. Respiratory: Normal respiratory effort, no increased work of breathing. GI: Abdomen is soft, nontender, nondistended, no abdominal masses GU: No CVA tenderness  Skin: No rashes, bruises or suspicious lesions. Lymph: No cervical or inguinal adenopathy. Neurologic: Grossly intact, no focal deficits, moving all 4 extremities. Psychiatric: Normal mood and affect.  Laboratory Data: Lab Results  Component Value Date   WBC 5.5 04/10/2015   HGB 12.5* 04/10/2015   HCT 38.0* 04/10/2015   MCV 89.3 04/10/2015   PLT 205 04/10/2015    Lab Results  Component Value Date   CREATININE 1.11  04/10/2015    Lab Results  Component Value Date   PSA 0.9 02/27/2014    No results found for: TESTOSTERONE  No results found for: HGBA1C  Urinalysis    Component Value Date/Time   COLORURINE Yellow 08/22/2014 2005   APPEARANCEUR Turbid 08/22/2014 2005   LABSPEC 1.022 08/22/2014 2005   PHURINE 5.0 08/22/2014 2005   GLUCOSEU Negative 06/24/2015 1015   GLUCOSEU Negative 08/22/2014 2005   HGBUR 3+ 08/22/2014 2005  BILIRUBINUR Negative 06/24/2015 1015   BILIRUBINUR Negative 08/22/2014 2005   KETONESUR Negative 08/22/2014 2005   PROTEINUR >=500 08/22/2014 2005   NITRITE Positive* 06/24/2015 1015   NITRITE Negative 08/22/2014 2005   LEUKOCYTESUR 3+* 06/24/2015 1015   LEUKOCYTESUR 3+ 08/22/2014 2005    Pertinent Imaging: Postvoid residual 300 mL over 12 hours.   Assessment & Plan:  Status post laser prostatectomy for a large prostate. Patient did have urinary retention for many years. He had a suprapubic catheter for approximately 9 months before my procedure. He blames all his problems on placement of a suprapubic catheter. Present time he has a pseudomonas aeruginosa urinary tract infection with a sensitivity to Cipro. He's been placed on 500 Cipro twice a day for 10 days. I gave him samples. It's very hard for him to understand that it may take a long time for his bladder to work spontaneous he had so many years of obstruction from his prostate getting rid of his prostate has created a open bladder and prostatic fossa but the bladder was not able to function for so [place] cath himself he does not void for quite a while. So he lets his bladder filled and then he will experience. I will see him in 10 days. Needs to Twice a day and take his medication   1 . Urinary frequency Now going every 30 minutes. Cathing 4 about 250 residual - Bladder Scan (Post Void Residual) in office   No Follow-up on file.  Collier Flowers, [facility] [address], [place], [place] [address] [phone]

## 2015-06-27 NOTE — Progress Notes (Signed)
Bladder Scan Patient  void: 332 ml Performed By: Larna Daughters

## 2015-07-14 ENCOUNTER — Telehealth: Payer: Self-pay | Admitting: Urology

## 2015-07-14 NOTE — Telephone Encounter (Signed)
Dr. Elnoria Howard wanted the patient to take a round of antibiotics to see if it would relieve his symptoms.  Patient called today and stated that his symptoms are not any better.  Patient is going out of town for Thanksgiving and would like a call back next week.

## 2015-07-14 NOTE — Telephone Encounter (Signed)
Spoke with pt in reference to urinary symptoms. Pt is going to come into the office tomorrow to see Ria Comment.

## 2015-07-15 ENCOUNTER — Encounter: Payer: Self-pay | Admitting: Obstetrics and Gynecology

## 2015-07-15 ENCOUNTER — Ambulatory Visit (INDEPENDENT_AMBULATORY_CARE_PROVIDER_SITE_OTHER): Payer: Medicare Other | Admitting: Obstetrics and Gynecology

## 2015-07-15 VITALS — BP 131/85 | HR 59 | Ht 73.0 in | Wt 251.6 lb

## 2015-07-15 DIAGNOSIS — R350 Frequency of micturition: Secondary | ICD-10-CM

## 2015-07-15 DIAGNOSIS — R339 Retention of urine, unspecified: Secondary | ICD-10-CM

## 2015-07-15 LAB — URINALYSIS, COMPLETE
BILIRUBIN UA: NEGATIVE
GLUCOSE, UA: NEGATIVE
KETONES UA: NEGATIVE
Nitrite, UA: NEGATIVE
SPEC GRAV UA: 1.015 (ref 1.005–1.030)
UUROB: 0.2 mg/dL (ref 0.2–1.0)
pH, UA: 5.5 (ref 5.0–7.5)

## 2015-07-15 LAB — MICROSCOPIC EXAMINATION

## 2015-07-15 LAB — BLADDER SCAN AMB NON-IMAGING: Scan Result: 360

## 2015-07-15 MED ORDER — CIPROFLOXACIN HCL 500 MG PO TABS: 500.0000 mg | ORAL_TABLET | Freq: Two times a day (BID) | ORAL | Status: DC

## 2015-07-15 NOTE — Progress Notes (Signed)
Due to permanent urinary retention, patient will cath three times daily, for lifetime.

## 2015-07-15 NOTE — Progress Notes (Addendum)
07/15/2015 12:17 PM   [place] Jan 29, 1932 [id number]  Referring provider: [name], MD [address] [place], [place] [address]  Chief Complaint  Patient presents with  . Urinary Frequency    HPI: Patient is an 81 year old male with a history of large cell lymphoma, anaplastic type and urinary retention during chemotherapy requiring a suprapubic tube at one point for urinary retention. He underwent KTP laser ablation of the prostate on 04/16/15 by Dr. Elnoria Howard. He reports that he has been able to void on his own since with a relatively strong stream. He does however report he is been unable to empty his bladder and has continued to perform CIC at least once per day prior to bedtime with significant residuals obtained. He does not experience significant daytime frequency and states he voids approximately 3-4 times daily. Was diagnosed with a urinary tract infection at his last visit here approximately 3 weeks ago and treated with po Cipro. Denies any dysuria, fevers or flank pain.  He is a previous patient of Dr. Dene Gentry with [facility].  I-PSS: 7 (most severe complaint nocturia if he does not perform CIC prior to bed) QOL: mixed  Patient reports that he's had a UDS in the past but that he was unable to void because of "bashful" bladder per patient.  PMH: Past Medical History  Diagnosis Date  . Heart disease   . Cancer ([place]) 2015    Lymphoma  . Helicobacter pylori infection 06/18/2015    by EGD RX 08/18/1999   . History of adenomatous polyp of colon 06/18/2015    Surgical History: Past Surgical History  Procedure Laterality Date  . Heart stent placement      1998, 2000, 2012  . Green light laser turp (transurethral resection of prostate N/A 04/16/2015    Procedure: GREEN LIGHT LASER TURP (TRANSURETHRAL RESECTION OF PROSTATE WITH BLADDER BIOPSY;  Surgeon: Collier Flowers, MD;  Location: [facility] ORS;  Service: Urology;  Laterality: N/A;  . Angioplasty    .  Cataract extraction      Home Medications:    Medication List       This list is accurate as of: 07/15/15 11:59 PM.  Always use your most recent med list.               aspirin 81 MG tablet  Take 81 mg by mouth daily.     atorvastatin 10 MG tablet  Commonly known as:  LIPITOR  Take 10 mg by mouth daily.     CIALIS 5 MG tablet  Generic drug:  tadalafil  Take by mouth.     ciprofloxacin 500 MG tablet  Commonly known as:  CIPRO  Take 1 tablet (500 mg total) by mouth every 12 (twelve) hours.     cyanocobalamin 1000 MCG tablet  Take by mouth.     finasteride 5 MG tablet  Commonly known as:  PROSCAR  Take 5 mg by mouth daily.     fluticasone 50 MCG/ACT nasal spray  Commonly known as:  FLONASE  Place 2 sprays into both nostrils daily.     hydrochlorothiazide 25 MG tablet  Commonly known as:  HYDRODIURIL  Take 25 mg by mouth daily.     isosorbide dinitrate 30 MG tablet  Commonly known as:  ISORDIL  Take 30 mg by mouth every morning.     metoprolol 100 MG tablet  Commonly known as:  LOPRESSOR  Take 100 mg by mouth 2 (two) times daily.  nitroGLYCERIN 0.4 MG SL tablet  Commonly known as:  NITROSTAT  Place 0.4 mg under the tongue every 5 (five) minutes as needed for chest pain.     omeprazole 40 MG capsule  Commonly known as:  PRILOSEC  Take 40 mg by mouth 2 (two) times daily.     ramipril 2.5 MG capsule  Commonly known as:  ALTACE  Take 2.5 mg by mouth daily.     sucralfate 1 G tablet  Commonly known as:  CARAFATE  Take 1 g by mouth 2 (two) times daily.     tamsulosin 0.4 MG Caps capsule  Commonly known as:  FLOMAX  Take 0.4 mg by mouth daily.     temazepam 30 MG capsule  Commonly known as:  RESTORIL  take 1 capsule by mouth at bedtime if needed     ZANTAC 150 MG tablet  Generic drug:  ranitidine  Take 150 mg by mouth 2 (two) times daily.     zolpidem 10 MG tablet  Commonly known as:  AMBIEN  Take by mouth.        Allergies:  Allergies   Allergen Reactions  . Ciprofloxacin Diarrhea  . Lisinopril Other (See Comments)  . Penicillins     unknwon reaction.  tolerates amoxicillin  . Sulfa Antibiotics Itching and Rash    Rash    Family History: Family History  Problem Relation Age of Onset  . Osteoporosis Mother   . Alzheimer's disease Father     Social History:  reports that he quit smoking about 33 years ago. His smoking use included Cigarettes. He has a 18 pack-year smoking history. He has never used smokeless tobacco. He reports that he does not drink alcohol or use illicit drugs.  ROS: UROLOGY Frequent Urination?: No Hard to postpone urination?: No Burning/pain with urination?: No Get up at night to urinate?: No Leakage of urine?: No Urine stream starts and stops?: No Trouble starting stream?: No Do you have to strain to urinate?: No Blood in urine?: No Urinary tract infection?: No Sexually transmitted disease?: No Injury to kidneys or bladder?: No Painful intercourse?: No Weak stream?: No Erection problems?: No Penile pain?: No  Gastrointestinal Nausea?: No Vomiting?: No Indigestion/heartburn?: No Diarrhea?: No Constipation?: No  Constitutional Fever: No Night sweats?: No Weight loss?: No Fatigue?: No  Skin Skin rash/lesions?: No Itching?: No  Eyes Blurred vision?: No Double vision?: No  Ears/Nose/Throat Sore throat?: No Sinus problems?: No  Hematologic/Lymphatic Swollen glands?: No Easy bruising?: No  Cardiovascular Leg swelling?: No Chest pain?: No  Respiratory Cough?: No Shortness of breath?: No  Endocrine Excessive thirst?: No  Musculoskeletal Back pain?: No Joint pain?: No  Neurological Headaches?: No Dizziness?: No  Psychologic Depression?: No Anxiety?: No  Physical Exam: BP 131/85 mmHg  Pulse 59  Ht 6\' 1"  (1.854 m)  Wt 251 lb 9.6 oz (114.125 kg)  BMI 33.20 kg/m2  Constitutional:  Alert and oriented, No acute distress. HEENT: [place] AT, moist mucus  membranes.  Trachea midline, no masses. Cardiovascular: No clubbing, cyanosis, or edema. Respiratory: Normal respiratory effort, no increased work of breathing. Skin: No rashes, bruises or suspicious lesions. Neurologic: Grossly intact, no focal deficits, moving all 4 extremities. Psychiatric: Normal mood and affect.  Laboratory Data:   Urinalysis  Pertinent Imaging:   Assessment & Plan:    1. Incomplete bladder emptying- S/p KTP laser ablation of prostate. UA suspicious for infection vs chronic colonization today. Sent for culture. Patient has been using the same catheter.Provided coude  catheter samples today.  Patient provided printed prescription for Cipro to hold should he become symptomatic over the holiday weekend.  Prescription sent for catheter supplies.  Patient to perform CIC up to 3 time daily using coude catheter due to BPH as needed indefinitely. Patient declined repeat UDS.  He will be scheduled for cystoscopy for further evaluation of continued voiding symptoms.  - Urinalysis, Complete - BLADDER SCAN AMB NON-IMAGING  2. BPH- S/p KTP laser ablation without significant improvement in symptoms.  Return for schedule cystoscopy with Dr. Erlene Quan.  These notes generated with voice recognition software. I apologize for typographical errors.  Herbert Moors, [facility] [address] [place], [place] [address] [phone]

## 2015-07-17 LAB — CULTURE, URINE COMPREHENSIVE

## 2015-07-19 ENCOUNTER — Telehealth: Payer: Self-pay | Admitting: Obstetrics and Gynecology

## 2015-07-19 MED ORDER — NITROFURANTOIN MONOHYD MACRO 100 MG PO CAPS: 100.0000 mg | ORAL_CAPSULE | Freq: Two times a day (BID) | ORAL | Status: DC

## 2015-07-21 ENCOUNTER — Inpatient Hospital Stay: Payer: Medicare Other | Attending: Oncology

## 2015-07-21 ENCOUNTER — Telehealth: Payer: Self-pay

## 2015-07-21 NOTE — Telephone Encounter (Signed)
Pt called stating he got a call from his pharmacy over the weekend about another abx. Pt was confused and not sure which medication to take. Nurse made pt aware to take macrobid due to bacteria being resistant to cipro. Pt voiced understanding. Nurse asked if pt is using a new cath each time. Pt stated that he had run out therefore he has been using the same one. Nurse reinforced with pt not to do that and would provide more catheters for him. Pt stated he would come by to pick those up.

## 2015-07-30 ENCOUNTER — Other Ambulatory Visit: Payer: Medicare Other | Admitting: Urology

## 2015-08-04 ENCOUNTER — Encounter: Payer: Self-pay | Admitting: Urology

## 2015-08-04 ENCOUNTER — Ambulatory Visit (INDEPENDENT_AMBULATORY_CARE_PROVIDER_SITE_OTHER): Payer: Medicare Other | Admitting: Urology

## 2015-08-04 VITALS — BP 121/75 | HR 69 | Ht 73.0 in | Wt 254.8 lb

## 2015-08-04 DIAGNOSIS — N390 Urinary tract infection, site not specified: Secondary | ICD-10-CM | POA: Diagnosis not present

## 2015-08-04 DIAGNOSIS — N138 Other obstructive and reflux uropathy: Secondary | ICD-10-CM

## 2015-08-04 DIAGNOSIS — N401 Enlarged prostate with lower urinary tract symptoms: Secondary | ICD-10-CM

## 2015-08-04 DIAGNOSIS — T83511A Infection and inflammatory reaction due to indwelling urethral catheter, initial encounter: Secondary | ICD-10-CM

## 2015-08-04 DIAGNOSIS — R339 Retention of urine, unspecified: Secondary | ICD-10-CM | POA: Diagnosis not present

## 2015-08-04 LAB — URINALYSIS, COMPLETE
Bilirubin, UA: NEGATIVE
Glucose, UA: NEGATIVE
Ketones, UA: NEGATIVE
Nitrite, UA: POSITIVE — AB
PH UA: 6.5 (ref 5.0–7.5)
Specific Gravity, UA: 1.02 (ref 1.005–1.030)
UUROB: 0.2 mg/dL (ref 0.2–1.0)

## 2015-08-04 LAB — MICROSCOPIC EXAMINATION

## 2015-08-04 MED ORDER — DOXYCYCLINE MONOHYDRATE 100 MG PO CAPS: 100.0000 mg | ORAL_CAPSULE | Freq: Once | ORAL | Status: DC

## 2015-08-04 MED ORDER — LIDOCAINE HCL 2 % EX GEL: 1.0000 "application " | Freq: Once | CUTANEOUS | Status: DC

## 2015-08-04 MED ORDER — CIPROFLOXACIN HCL 500 MG PO TABS: 500.0000 mg | ORAL_TABLET | Freq: Once | ORAL | Status: DC

## 2015-08-04 NOTE — Progress Notes (Signed)
08/04/2015 5:27 PM   Bryan [place] Jul 18, 1932 [id number]  Referring provider: [name], MD [address] [place], [place] [address]  Chief Complaint  Patient presents with  . Cysto    HPI: 81 year old male who presents today for office cystoscopy. He has a known history of urinary retention status post KTP laser the prostate but Dr. Elnoria Howard on 04/16/2015. Since then, he's been able to void some on his own with a decent stream. He has been intermittent catheterizing himself 3-4 times daily with fairly decent residuals.  He continues to be bothered by nocturia despite catheterizing himself just before bed.  He is also had an increase of his daytime urgency and frequency since his last visit. No dysuria or gross hematuria. No fevers or chills.  He was initially scheduled for cystoscopy today, however, UA is consistent with UTI today.  Of note, the patient is a former patient of [facility]rology, Dr. Bernardo Heater.  He did have a fairly extensive workup prior to being managed at our office including urodynamics 01/2014 which showed normal bladder sensation with the increase capacity and normal compliance. There is no evidence of detrusor overactivity. He is not able to void therefore no assessment could be made on the degree of outlet obstruction. He ultimately underwent a suprapubic tube which was later discontinued in lieu of self catheter.   PMH: Past Medical History  Diagnosis Date  . Heart disease   . Cancer ([place]) 2015    Lymphoma  . Helicobacter pylori infection 06/18/2015    by EGD RX 08/18/1999   . History of adenomatous polyp of colon 06/18/2015  . Arteriosclerosis of coronary artery 08/26/2011    Overview:      a.  1999 PCI of the mid LAD with stent.      b.  2002 Cath [place]: EF 56%. RCA-distal 25/25%. Left main-50% ostial.  Left circumflex-25% OM2.  LAD-25% proximal.  75% mid.  25/25% distal. 75% D1.      c.  07/2011 PCI of RCA with DES.[place]   . BP (high blood  pressure) 08/26/2011  . Bleeding gastrointestinal 08/26/2011    Overview:      a.  Chronic abdominal pain, present improving.      b.  Duodenitis and gastritis by EGD in 2000.      c.  Pylori in 2000, treated.      d.  Gastroesophageal reflux disease.      e.  Diverticular disease.      Surgical History: Past Surgical History  Procedure Laterality Date  . Heart stent placement      1998, 2000, 2012  . Green light laser turp (transurethral resection of prostate N/A 04/16/2015    Procedure: GREEN LIGHT LASER TURP (TRANSURETHRAL RESECTION OF PROSTATE WITH BLADDER BIOPSY;  Surgeon: Collier Flowers, MD;  Location: [facility] ORS;  Service: Urology;  Laterality: N/A;  . Angioplasty    . Cataract extraction      Home Medications:    Medication List       This list is accurate as of: 08/04/15  5:27 PM.  Always use your most recent med list.               aspirin 81 MG tablet  Take 81 mg by mouth daily.     atorvastatin 10 MG tablet  Commonly known as:  LIPITOR  Take 10 mg by mouth daily.     CIALIS 5 MG tablet  Generic drug:  tadalafil  Take by mouth.     ciprofloxacin 500 MG tablet  Commonly known as:  CIPRO  Take 1 tablet (500 mg total) by mouth every 12 (twelve) hours.     cyanocobalamin 1000 MCG tablet  Take by mouth.     finasteride 5 MG tablet  Commonly known as:  PROSCAR  Take 5 mg by mouth daily.     fluticasone 50 MCG/ACT nasal spray  Commonly known as:  FLONASE  Place 2 sprays into both nostrils daily.     hydrochlorothiazide 25 MG tablet  Commonly known as:  HYDRODIURIL  Take 25 mg by mouth daily.     isosorbide dinitrate 30 MG tablet  Commonly known as:  ISORDIL  Take 30 mg by mouth every morning.     metoprolol 100 MG tablet  Commonly known as:  LOPRESSOR  Take 100 mg by mouth 2 (two) times daily.     nitrofurantoin (macrocrystal-monohydrate) 100 MG capsule  Commonly known as:  MACROBID  Take 1 capsule (100 mg total) by mouth 2 (two) times daily.      nitroGLYCERIN 0.4 MG SL tablet  Commonly known as:  NITROSTAT  Place 0.4 mg under the tongue every 5 (five) minutes as needed for chest pain.     omeprazole 40 MG capsule  Commonly known as:  PRILOSEC  Take 40 mg by mouth 2 (two) times daily.     ramipril 2.5 MG capsule  Commonly known as:  ALTACE  Take 2.5 mg by mouth daily.     sucralfate 1 G tablet  Commonly known as:  CARAFATE  Take 1 g by mouth 2 (two) times daily.     tamsulosin 0.4 MG Caps capsule  Commonly known as:  FLOMAX  Take 0.4 mg by mouth daily.     temazepam 30 MG capsule  Commonly known as:  RESTORIL  take 1 capsule by mouth at bedtime if needed     ZANTAC 150 MG tablet  Generic drug:  ranitidine  Take 150 mg by mouth 2 (two) times daily.     zolpidem 10 MG tablet  Commonly known as:  AMBIEN  Take by mouth.        Allergies:  Allergies  Allergen Reactions  . Ciprofloxacin Diarrhea  . Lisinopril Other (See Comments)  . Penicillins     unknwon reaction.  tolerates amoxicillin  . Sulfa Antibiotics Itching and Rash    Rash    Family History: Family History  Problem Relation Age of Onset  . Osteoporosis Mother   . Alzheimer's disease Father     Social History:  reports that he quit smoking about 33 years ago. His smoking use included Cigarettes. He has a 18 pack-year smoking history. He has never used smokeless tobacco. He reports that he does not drink alcohol or use illicit drugs.   Physical Exam: BP 121/75 mmHg  Pulse 69  Ht 6\' 1"  (1.854 m)  Wt 254 lb 12.8 oz (115.577 kg)  BMI 33.62 kg/m2  Constitutional:  Alert and oriented, No acute distress. HEENT: [place] AT, moist mucus membranes.  Trachea midline, no masses. Cardiovascular: No clubbing, cyanosis, or edema. Respiratory: Normal respiratory effort, no increased work of breathing. GI: Abdomen is soft, nontender, nondistended, no abdominal masses GU: No CVA tenderness.  Skin: No rashes, bruises or suspicious lesions. Neurologic: Grossly  intact, no focal deficits, moving all 4 extremities. Psychiatric: Normal mood and affect.  Laboratory Data: Lab Results  Component Value Date   WBC 5.5 04/10/2015  HGB 12.5* 04/10/2015   HCT 38.0* 04/10/2015   MCV 89.3 04/10/2015   PLT 205 04/10/2015    Lab Results  Component Value Date   CREATININE 1.11 04/10/2015    Lab Results  Component Value Date   PSA 0.9 02/27/2014    Urinalysis Urine dip today with 1+ blood, trace protein, nitrite positive, 2+ leukocyte esterase. Microscopic exam shows greater than 30 white blood cells per high-powered field, moderate bacteria, no red blood cells.  Pertinent Imaging: N/a  Assessment & Plan:    1. Benign prostatic hyperplasia with urinary obstruction/ Incomplete bladder emptying We discussed his recent outlet procedure and his overall prognosis and recovering bladder function. He continues to have elevated postvoid residuals and need for self catheter 3 times a day. He was given a urinal today to measure his post void residuals and asked to keep a log. I explained that at this point, his bladder may not be able to generate adequate pressures and his ongoing inability to empty may be related to hypotonic bladder as opposed to ongoing outlet obstruction. I do not feel that there is any significant benefit to proceeding with cystoscopy at this point.  I recommend continuation of self catheter 3 times a day and keep a log for [place]. If Foley and start to decrease, he may reduce the number of catheterizations daily. I explained that he may recover some bladder function over time with bladder retraining now that his outlet is been reduced.  If there is ultimately no improvement, may consider repeat urodynamics to assess for ongoing bladder outlet obstruction although unlikely.  Continue finasteride/ flomax at this time.  - Urinalysis, Complete -Urine culture  2. UTI-  UA today highly concerning for UTI. Asymptomatic other than worsening  urgency frequency symptoms. Will call patient with urine culture results and treat as needed.  Return in about 6 months (around 02/02/2016) for midlevel.  Or sooner as needed.   Hollice Espy, MD  [facility] [address] [place], [place] [address] [phone]

## 2015-08-05 ENCOUNTER — Other Ambulatory Visit: Payer: Self-pay | Admitting: Family Medicine

## 2015-08-05 NOTE — Telephone Encounter (Signed)
Please call in temazepam 

## 2015-08-05 NOTE — Telephone Encounter (Signed)
Rx called in to pharmacy. 

## 2015-08-06 ENCOUNTER — Telehealth: Payer: Self-pay | Admitting: Urology

## 2015-08-06 DIAGNOSIS — N390 Urinary tract infection, site not specified: Secondary | ICD-10-CM

## 2015-08-06 LAB — CULTURE, URINE COMPREHENSIVE

## 2015-08-06 NOTE — Telephone Encounter (Signed)
+  UTI.  Since he catheterizes, only want to treat if symptomatic.  Please let him know this.  If he does develop worsening symptoms, fevers, chills, etc, then we can treat.    Unfortunately, he is allergy to most things we can use.  If he needs to be treated, lets try Levaquin since his allergy to cipro is just loose stool.    Hollice Espy, MD

## 2015-08-06 NOTE — Telephone Encounter (Signed)
No answer

## 2015-08-08 MED ORDER — LEVOFLOXACIN 500 MG PO TABS: 500.0000 mg | ORAL_TABLET | Freq: Every day | ORAL | Status: AC

## 2015-08-08 NOTE — Telephone Encounter (Signed)
Spoke with pt in reference to ucx. Pt c/o frequency, urgency, and only dribbling upon urination. Levaquin was sent to pt pharmacy. Pt will RTC after completing abx to ensure infection is gone.

## 2015-08-08 NOTE — Addendum Note (Signed)
Addended by: Toniann Fail C on: 08/08/2015 10:24 AM   Modules accepted: Orders

## 2015-08-19 ENCOUNTER — Ambulatory Visit: Payer: Medicare Other | Admitting: *Deleted

## 2015-08-19 DIAGNOSIS — N390 Urinary tract infection, site not specified: Secondary | ICD-10-CM

## 2015-08-19 LAB — URINALYSIS, COMPLETE
Bilirubin, UA: NEGATIVE
Glucose, UA: NEGATIVE
KETONES UA: NEGATIVE
NITRITE UA: NEGATIVE
PH UA: 5.5 (ref 5.0–7.5)
SPEC GRAV UA: 1.02 (ref 1.005–1.030)
Urobilinogen, Ur: 0.2 mg/dL (ref 0.2–1.0)

## 2015-08-19 LAB — MICROSCOPIC EXAMINATION: WBC, UA: >30 /hpf — ABNORMAL HIGH (ref 0–?)

## 2015-08-26 ENCOUNTER — Other Ambulatory Visit: Payer: Medicare Other

## 2015-08-26 DIAGNOSIS — N390 Urinary tract infection, site not specified: Secondary | ICD-10-CM

## 2015-08-26 LAB — URINALYSIS, COMPLETE
BILIRUBIN UA: NEGATIVE
GLUCOSE, UA: NEGATIVE
Ketones, UA: NEGATIVE
Nitrite, UA: NEGATIVE
PROTEIN UA: NEGATIVE
RBC UA: NEGATIVE
Specific Gravity, UA: 1.01 (ref 1.005–1.030)
UUROB: 0.2 mg/dL (ref 0.2–1.0)
pH, UA: 6 (ref 5.0–7.5)

## 2015-08-26 LAB — MICROSCOPIC EXAMINATION: Epithelial Cells (non renal): NONE SEEN /hpf (ref 0–10)

## 2015-08-26 NOTE — Progress Notes (Signed)
Pt urine was not sent for ucx last week. Pt came in today for a cath specimen.

## 2015-08-29 ENCOUNTER — Telehealth: Payer: Self-pay | Admitting: Urology

## 2015-08-29 LAB — CULTURE, URINE COMPREHENSIVE

## 2015-08-29 NOTE — Telephone Encounter (Signed)
I have contacted the patient about his antibiotic.

## 2015-09-01 ENCOUNTER — Inpatient Hospital Stay: Payer: Medicare Other

## 2015-09-02 ENCOUNTER — Telehealth: Payer: Self-pay

## 2015-09-02 ENCOUNTER — Other Ambulatory Visit: Payer: Self-pay | Admitting: Urology

## 2015-09-02 DIAGNOSIS — N390 Urinary tract infection, site not specified: Secondary | ICD-10-CM

## 2015-09-02 MED ORDER — NITROFURANTOIN MONOHYD MACRO 100 MG PO CAPS: 100.0000 mg | ORAL_CAPSULE | Freq: Two times a day (BID) | ORAL | Status: DC

## 2015-09-02 NOTE — Telephone Encounter (Signed)
Spoke with pt in reference +ucx. Pt stated he received a call over the weekend from [place]. Pt stated however the medication was not called in. Nurse called medication in again. Pt will RTC 09/12/15 for cath specimen.

## 2015-09-02 NOTE — Telephone Encounter (Signed)
-----   Message from Nori Riis, PA-C sent at 08/29/2015  5:04 PM EST ----- Please tell the patient he has a positive urine culture. I have sent a prescription for nitrofurantoin 100 mg 1 capsule twice daily for 7 days to his pharmacy, [facility] [address]. We will need to recheck a CATH UA 3-5 days after he completes his antibiotics.

## 2015-09-08 ENCOUNTER — Ambulatory Visit (INDEPENDENT_AMBULATORY_CARE_PROVIDER_SITE_OTHER): Payer: Medicare Other

## 2015-09-08 DIAGNOSIS — N390 Urinary tract infection, site not specified: Secondary | ICD-10-CM

## 2015-09-08 LAB — URINALYSIS, COMPLETE
Bilirubin, UA: NEGATIVE
GLUCOSE, UA: NEGATIVE
KETONES UA: NEGATIVE
NITRITE UA: POSITIVE — AB
SPEC GRAV UA: 1.02 (ref 1.005–1.030)
UUROB: 0.2 mg/dL (ref 0.2–1.0)
pH, UA: 6 (ref 5.0–7.5)

## 2015-09-08 LAB — MICROSCOPIC EXAMINATION
Epithelial Cells (non renal): NONE SEEN /hpf (ref 0–10)
RBC MICROSCOPIC, UA: NONE SEEN /HPF (ref 0–?)

## 2015-09-08 NOTE — Progress Notes (Signed)
Pt called this morning c/o of frequency q32min and dysuria. Cath specimen was obtained. Pt is still taking macrobid and stated he has 1 pill left for tonight. Reinforced with pt how to properly perform CIC and drain the bladder fully. Pt voiced understanding.

## 2015-09-10 ENCOUNTER — Ambulatory Visit (INDEPENDENT_AMBULATORY_CARE_PROVIDER_SITE_OTHER): Payer: Medicare Other | Admitting: Urology

## 2015-09-10 ENCOUNTER — Encounter: Payer: Self-pay | Admitting: Urology

## 2015-09-10 VITALS — BP 132/81 | HR 89 | Ht 73.0 in | Wt 250.0 lb

## 2015-09-10 DIAGNOSIS — R1030 Lower abdominal pain, unspecified: Secondary | ICD-10-CM | POA: Diagnosis not present

## 2015-09-10 DIAGNOSIS — N3000 Acute cystitis without hematuria: Secondary | ICD-10-CM

## 2015-09-10 NOTE — Progress Notes (Signed)
09/10/2015 11:47 PM   [name] 1931/12/18 [id number]  Referring provider: [name], MD [address] [place], [place] [address]  Chief Complaint  Patient presents with  . Urinary Tract Infection    HPI: Patient is an 82 year old Caucasian male who we are currently awaiting the results of culture and sensitivity who was complaining of suprapubic discomfort.    He is to be cathing himself 3 times daily and logging his residuals.  He has not been consistent in measuring his residuals.  He is CIC 3 times a day.   He stated he has himself once since he presented to the office this afternoon.   His PVR today is greater than 300 mL.  He stated that he was having the suprapubic pain at this time.  I asked him to cath himself and he retrieved the 300 cc of urine.  His suprapubic pain then abated.     PMH: Past Medical History  Diagnosis Date  . Heart disease   . Cancer ([place]) 2015    Lymphoma  . Helicobacter pylori infection 06/18/2015    by EGD RX 08/18/1999   . History of adenomatous polyp of colon 06/18/2015  . Arteriosclerosis of coronary artery 08/26/2011    Overview:      a.  1999 PCI of the mid LAD with stent.      b.  2002 Cath [place]: EF 56%. RCA-distal 25/25%. Left main-50% ostial.  Left circumflex-25% OM2.  LAD-25% proximal.  75% mid.  25/25% distal. 75% D1.      c.  07/2011 PCI of RCA with DES.[place]   . BP (high blood pressure) 08/26/2011  . Bleeding gastrointestinal 08/26/2011    Overview:      a.  Chronic abdominal pain, present improving.      b.  Duodenitis and gastritis by EGD in 2000.      c.  Pylori in 2000, treated.      d.  Gastroesophageal reflux disease.      e.  Diverticular disease.      Surgical History: Past Surgical History  Procedure Laterality Date  . Heart stent placement      1998, 2000, 2012  . Green light laser turp (transurethral resection of prostate N/A 04/16/2015    Procedure: GREEN LIGHT LASER TURP (TRANSURETHRAL  RESECTION OF PROSTATE WITH BLADDER BIOPSY;  Surgeon: Collier Flowers, MD;  Location: [facility] ORS;  Service: Urology;  Laterality: N/A;  . Angioplasty    . Cataract extraction      Home Medications:    Medication List       This list is accurate as of: 09/10/15 11:59 PM.  Always use your most recent med list.               aspirin 81 MG tablet  Take 81 mg by mouth daily.     atorvastatin 10 MG tablet  Commonly known as:  LIPITOR  Take 10 mg by mouth daily.     CIALIS 5 MG tablet  Generic drug:  tadalafil  Take by mouth.     cyanocobalamin 1000 MCG tablet  Take by mouth.     finasteride 5 MG tablet  Commonly known as:  PROSCAR  Take 5 mg by mouth daily.     fluticasone 50 MCG/ACT nasal spray  Commonly known as:  FLONASE  Place 2 sprays into both nostrils daily.     hydrochlorothiazide 25 MG tablet  Commonly known as:  HYDRODIURIL  Take 25 mg by mouth daily.     isosorbide dinitrate 30 MG tablet  Commonly known as:  ISORDIL  Take 30 mg by mouth every morning.     metoprolol 100 MG tablet  Commonly known as:  LOPRESSOR  Take 100 mg by mouth 2 (two) times daily.     nitrofurantoin (macrocrystal-monohydrate) 100 MG capsule  Commonly known as:  MACROBID  Take 1 capsule (100 mg total) by mouth every 12 (twelve) hours.     nitroGLYCERIN 0.4 MG SL tablet  Commonly known as:  NITROSTAT  Place 0.4 mg under the tongue every 5 (five) minutes as needed for chest pain.     omeprazole 40 MG capsule  Commonly known as:  PRILOSEC  Take 40 mg by mouth 2 (two) times daily.     ramipril 2.5 MG capsule  Commonly known as:  ALTACE  Take 2.5 mg by mouth daily.     sucralfate 1 g tablet  Commonly known as:  CARAFATE  Take 1 g by mouth 2 (two) times daily.     tamsulosin 0.4 MG Caps capsule  Commonly known as:  FLOMAX  Take 0.4 mg by mouth daily.     temazepam 30 MG capsule  Commonly known as:  RESTORIL  take 1 capsule by mouth at bedtime if needed     ZANTAC 150 MG  tablet  Generic drug:  ranitidine  Take 150 mg by mouth 2 (two) times daily.     zolpidem 10 MG tablet  Commonly known as:  AMBIEN  Take by mouth.        Allergies:  Allergies  Allergen Reactions  . Ciprofloxacin Diarrhea  . Lisinopril Other (See Comments)  . Penicillins     unknwon reaction.  tolerates amoxicillin  . Sulfa Antibiotics Itching and Rash    Rash    Family History: Family History  Problem Relation Age of Onset  . Osteoporosis Mother   . Alzheimer's disease Father     Social History:  reports that he quit smoking about 34 years ago. His smoking use included Cigarettes. He has a 18 pack-year smoking history. He has never used smokeless tobacco. He reports that he does not drink alcohol or use illicit drugs.  ROS: UROLOGY Frequent Urination?: Yes Hard to postpone urination?: Yes Burning/pain with urination?: Yes Get up at night to urinate?: Yes Leakage of urine?: No Urine stream starts and stops?: No Trouble starting stream?: No Do you have to strain to urinate?: No Blood in urine?: Yes Urinary tract infection?: Yes Sexually transmitted disease?: No Injury to kidneys or bladder?: No Painful intercourse?: No Weak stream?: No Erection problems?: No Penile pain?: No  Gastrointestinal Nausea?: No Vomiting?: No Indigestion/heartburn?: No Diarrhea?: No Constipation?: No  Constitutional Fever: No Night sweats?: No Weight loss?: No Fatigue?: No  Skin Skin rash/lesions?: No Itching?: No  Eyes Blurred vision?: No Double vision?: No  Ears/Nose/Throat Sore throat?: No Sinus problems?: No  Hematologic/Lymphatic Swollen glands?: No Easy bruising?: No  Cardiovascular Leg swelling?: No Chest pain?: No  Respiratory Cough?: No Shortness of breath?: No  Endocrine Excessive thirst?: No  Musculoskeletal Back pain?: No Joint pain?: No  Neurological Headaches?: No Dizziness?: No  Psychologic Depression?: No Anxiety?:  No  Physical Exam: BP 132/81 mmHg  Pulse 89  Ht 6\' 1"  (1.854 m)  Wt 250 lb (113.399 kg)  BMI 32.99 kg/m2  Constitutional: Well nourished. Alert and oriented, No acute distress. HEENT: [place] AT, moist mucus membranes. Trachea midline, no masses.  Cardiovascular: No clubbing, cyanosis, or edema. Respiratory: Normal respiratory effort, no increased work of breathing. Skin: No rashes, bruises or suspicious lesions. Lymph: No cervical or inguinal adenopathy. Neurologic: Grossly intact, no focal deficits, moving all 4 extremities. Psychiatric: Normal mood and affect.  Laboratory Data: Lab Results  Component Value Date   WBC 5.1 09/11/2015   HGB 12.6* 09/11/2015   HCT 38.1* 09/11/2015   MCV 87.1 09/11/2015   PLT 235 09/11/2015    Lab Results  Component Value Date   CREATININE 1.59* 09/11/2015    Lab Results  Component Value Date   PSA 0.9 02/27/2014    Lab Results  Component Value Date   AST 20 09/11/2015   Lab Results  Component Value Date   ALT 23 09/11/2015     Pertinent Imaging: Results for JAMESRYAN, KRAMMER (MRN [id number]) as of 09/15/2015 10:33  Ref. Range 09/15/2015 09:01  Scan Result Unknown 320    Assessment & Plan:    1. Suprapubic pain:   Patient's PVR was moderate upon presenting to our office today.  Once he cathed himself, the pain abated.  I explained to the patient that his discomfort is most likely occurring from a full bladder. If he should experience suprapubic pain again, he needs to cath himself.  This may mean he has to cath himself more than three times daily.  I also asked him to record his PVR's.    2. UTI:   Urine culture and sensitivities are still pending at this time.  We will contact the patient was these results are available.  He is to seek treatment to the emergency room if he develops fever or chills.   Return for I will contact the patient with urine culture results.  These notes generated with voice recognition software. I apologize  for typographical errors.  Zara Council, [facility] [address], [place], [place] [address] [phone]

## 2015-09-11 ENCOUNTER — Inpatient Hospital Stay: Payer: Medicare Other

## 2015-09-11 ENCOUNTER — Ambulatory Visit: Payer: Medicare Other | Admitting: Oncology

## 2015-09-11 ENCOUNTER — Inpatient Hospital Stay (HOSPITAL_BASED_OUTPATIENT_CLINIC_OR_DEPARTMENT_OTHER): Payer: Medicare Other | Admitting: Oncology

## 2015-09-11 ENCOUNTER — Other Ambulatory Visit: Payer: Medicare Other

## 2015-09-11 ENCOUNTER — Inpatient Hospital Stay: Payer: Medicare Other | Attending: Oncology

## 2015-09-11 ENCOUNTER — Encounter: Payer: Self-pay | Admitting: Oncology

## 2015-09-11 VITALS — BP 106/57 | HR 85 | Temp 98.7°F | Resp 18 | Wt 248.0 lb

## 2015-09-11 DIAGNOSIS — Z7982 Long term (current) use of aspirin: Secondary | ICD-10-CM | POA: Diagnosis not present

## 2015-09-11 DIAGNOSIS — R944 Abnormal results of kidney function studies: Secondary | ICD-10-CM

## 2015-09-11 DIAGNOSIS — E785 Hyperlipidemia, unspecified: Secondary | ICD-10-CM | POA: Insufficient documentation

## 2015-09-11 DIAGNOSIS — Z9221 Personal history of antineoplastic chemotherapy: Secondary | ICD-10-CM | POA: Diagnosis not present

## 2015-09-11 DIAGNOSIS — I2510 Atherosclerotic heart disease of native coronary artery without angina pectoris: Secondary | ICD-10-CM | POA: Diagnosis not present

## 2015-09-11 DIAGNOSIS — I10 Essential (primary) hypertension: Secondary | ICD-10-CM | POA: Insufficient documentation

## 2015-09-11 DIAGNOSIS — Z8719 Personal history of other diseases of the digestive system: Secondary | ICD-10-CM | POA: Diagnosis not present

## 2015-09-11 DIAGNOSIS — Z86010 Personal history of colonic polyps: Secondary | ICD-10-CM | POA: Insufficient documentation

## 2015-09-11 DIAGNOSIS — Z79899 Other long term (current) drug therapy: Secondary | ICD-10-CM

## 2015-09-11 DIAGNOSIS — I519 Heart disease, unspecified: Secondary | ICD-10-CM

## 2015-09-11 DIAGNOSIS — R109 Unspecified abdominal pain: Secondary | ICD-10-CM | POA: Insufficient documentation

## 2015-09-11 DIAGNOSIS — Z87891 Personal history of nicotine dependence: Secondary | ICD-10-CM | POA: Insufficient documentation

## 2015-09-11 DIAGNOSIS — M129 Arthropathy, unspecified: Secondary | ICD-10-CM | POA: Diagnosis not present

## 2015-09-11 DIAGNOSIS — K219 Gastro-esophageal reflux disease without esophagitis: Secondary | ICD-10-CM | POA: Insufficient documentation

## 2015-09-11 DIAGNOSIS — C8330 Diffuse large B-cell lymphoma, unspecified site: Secondary | ICD-10-CM | POA: Insufficient documentation

## 2015-09-11 DIAGNOSIS — N390 Urinary tract infection, site not specified: Secondary | ICD-10-CM | POA: Diagnosis not present

## 2015-09-11 DIAGNOSIS — C8590 Non-Hodgkin lymphoma, unspecified, unspecified site: Secondary | ICD-10-CM

## 2015-09-11 LAB — COMPREHENSIVE METABOLIC PANEL
ALBUMIN: 3.8 g/dL (ref 3.5–5.0)
ALT: 23 U/L (ref 17–63)
AST: 20 U/L (ref 15–41)
Alkaline Phosphatase: 60 U/L (ref 38–126)
Anion gap: 8 (ref 5–15)
BUN: 27 mg/dL — AB (ref 6–20)
CHLORIDE: 101 mmol/L (ref 101–111)
CO2: 24 mmol/L (ref 22–32)
CREATININE: 1.59 mg/dL — AB (ref 0.61–1.24)
Calcium: 8.9 mg/dL (ref 8.9–10.3)
GFR calc Af Amer: 45 mL/min — ABNORMAL LOW (ref >60)
GFR, EST NON AFRICAN AMERICAN: 39 mL/min — AB (ref >60)
GLUCOSE: 113 mg/dL — AB (ref 65–99)
POTASSIUM: 3.5 mmol/L (ref 3.5–5.1)
Sodium: 133 mmol/L — ABNORMAL LOW (ref 135–145)
Total Bilirubin: 0.5 mg/dL (ref 0.3–1.2)
Total Protein: 6.7 g/dL (ref 6.5–8.1)

## 2015-09-11 LAB — CBC WITH DIFFERENTIAL/PLATELET
BASOS ABS: 0 10*3/uL (ref 0–0.1)
BASOS PCT: 0 %
EOS PCT: 2 %
Eosinophils Absolute: 0.1 10*3/uL (ref 0–0.7)
HEMATOCRIT: 38.1 % — AB (ref 40.0–52.0)
Hemoglobin: 12.6 g/dL — ABNORMAL LOW (ref 13.0–18.0)
LYMPHS PCT: 10 %
Lymphs Abs: 0.5 10*3/uL — ABNORMAL LOW (ref 1.0–3.6)
MCH: 28.7 pg (ref 26.0–34.0)
MCHC: 33 g/dL (ref 32.0–36.0)
MCV: 87.1 fL (ref 80.0–100.0)
Monocytes Absolute: 1.1 10*3/uL — ABNORMAL HIGH (ref 0.2–1.0)
Monocytes Relative: 21 %
NEUTROS ABS: 3.4 10*3/uL (ref 1.4–6.5)
Neutrophils Relative %: 67 %
PLATELETS: 235 10*3/uL (ref 150–440)
RBC: 4.38 MIL/uL — AB (ref 4.40–5.90)
RDW: 15.2 % — ABNORMAL HIGH (ref 11.5–14.5)
WBC: 5.1 10*3/uL (ref 3.8–10.6)

## 2015-09-11 LAB — LACTATE DEHYDROGENASE: LDH: 174 U/L (ref 98–192)

## 2015-09-11 MED ORDER — HEPARIN SOD (PORK) LOCK FLUSH 100 UNIT/ML IV SOLN
500.0000 [IU] | Freq: Once | INTRAVENOUS | Status: AC
  Administered 2015-09-11: 500 [IU] via INTRAVENOUS

## 2015-09-11 MED ORDER — SODIUM CHLORIDE 0.9 % IJ SOLN
10.0000 mL | INTRAMUSCULAR | Status: DC | PRN
  Administered 2015-09-11: 10 mL via INTRAVENOUS
  Filled 2015-09-11: qty 10

## 2015-09-11 NOTE — Progress Notes (Signed)
Patient saw Dr. Erlene Quan yesterday for bladder issues.  Unable to empty his bladder.  Having to in/out cath 4 X day.  Also placed on antibiotics.

## 2015-09-11 NOTE — Progress Notes (Signed)
[facility] @ [facility] [phone]  [phone]     Bryan Nelson OB: 05/09/1932  MR#: [id number]  CSN#:[id number]  Patient Care Team: Donald E Fisher, MD as PCP - General (Family Medicine) Janak Choksi, MD (Unknown Physician Specialty) Seeplaputhur G Sankar, MD (General Surgery) Richard D Hart, MD (Urology)  CHIEF COMPLAINT:  Chief Complaint  Patient presents with  . Lymphoma     No history exists.   Subjective: Chief Complaint/Diagnosis:   1. Abnormal CT scan of abdomen and   retroperitoneal lymph adenopthy  Biopsy of retroperitoneal .  Lymph node was insufficient tissue.(March 13, 2014) 2. Biopsy of peripancreatic lymph node by EUS 3. Consistent with diffuse large cell lymphoma.  CD20 negative,alk negative.  Anaplastic type. Immunoreactivity is positive for B-cell markers. Dim Partial  activity for BCL 6 diagnosis in March 28, 2014 4.started on Cytoxan, Adriamycin, vincristine and prednisone September of 2015 5.testicular biopsy was negative for any lymphoma 5.patient has finished total 6 cycles of chemotherapy in January of 2016   INTERVAL HISTORY:  82-year-old gentleman came today further follow-up regarding lymphoma.  Patient had a diffuse B large cell lymphoma status post 6 cycles of chemotherapy. Abdominal pain.  No nausea.  No vomiting.  No diarrhea.  Patient continues to have recurrent urinary tract infection patient is doing self-catheterization being followed regularly by urologist. REVIEW OF SYSTEMS:   GENERAL:  Feels good.  Active.  No fevers, sweats or weight loss. PERFORMANCE STATUS (ECOG):  01 HEENT:  No visual changes, runny nose, sore throat, mouth sores or tenderness. Lungs: No shortness of breath or cough.  No hemoptysis. Cardiac:  No chest pain, palpitations, orthopnea, or PND. GI:  No nausea, vomiting, diarrhea, constipation, melena or hematochezia. GU:  And continues to have recurrent bladder infection being managed by  urologist..  Patient is doing    Self  catheterizationthe  Musculoskeletal:  No back pain.  No joint pain.  No muscle tenderness. Extremities:  No pain or swelling. Skin:  No rashes or skin changes. Neuro:  No headache, numbness or weakness, balance or coordination issues. Endocrine:  No diabetes, thyroid issues, hot flashes or night sweats. Psych:  No mood changes, depression or anxiety. Pain:  No focal pain. Review of systems:  All other systems reviewed and found to be negative. As per HPI. Otherwise, a complete review of systems is negatve.  PAST MEDICAL HISTORY: Past Medical History  Diagnosis Date  . Heart disease   . Cancer ([facility]) 2015    Lymphoma  . Helicobacter pylori infection 06/18/2015    by EGD RX 08/18/1999   . History of adenomatous polyp of colon 06/18/2015  . Arteriosclerosis of coronary artery 08/26/2011    Overview:      a.  1999 PCI of the mid LAD with stent.      b.  2002 Cath [place]: EF 56%. RCA-distal 25/25%. Left main-50% ostial.  Left circumflex-25% OM2.  LAD-25% proximal.  75% mid.  25/25% distal. 75% D1.      c.  07/2011 PCI of RCA with DES.   . BP (high blood pressure) 08/26/2011  . Bleeding gastrointestinal 08/26/2011    Overview:      a.  Chronic abdominal pain, present improving.      b.  Duodenitis and gastritis by EGD in 2000.      c.  Pylori in 2000, treated.      d.  Gastroesophageal reflux disease.      e.  Diverticular disease.        PAST SURGICAL HISTORY: Past Surgical History  Procedure Laterality Date  . Heart stent placement      1998, 2000, 2012  . Green light laser turp (transurethral resection of prostate N/A 04/16/2015    Procedure: GREEN LIGHT LASER TURP (TRANSURETHRAL RESECTION OF PROSTATE WITH BLADDER BIOPSY;  Surgeon: Collier Flowers, MD;  Location: [facility] ORS;  Service: Urology;  Laterality: N/A;  . Angioplasty    . Cataract extraction      FAMILY HISTORY Family History  Problem Relation Age of Onset  . Osteoporosis Mother    . Alzheimer's disease Father     ADVANCED DIRECTIVES:  No flowsheet data found.  HEALTH MAINTENANCE: Social History  Substance Use Topics  . Smoking status: Former Smoker -- 1.50 packs/day for 12 years    Types: Cigarettes    Quit date: 08/23/1981  . Smokeless tobacco: Never Used  . Alcohol Use: No  Significant History/PMH:   Non-Hodgkin's Lymphoma:    Arthritis:    BPH:    gerd:    hyperlipidemia:    htn:    cardiac stent:    Suprapubic cathether placement:   Preventive Screening:  Has patient had any of the following test? Prostate Exam (1)   Last Prostate Exam: June 2015(1)   Smoking History: Smoking History 1(1)Packs per day and 20 year use, quit 50 years ago(1)  PFSH: Family History: noncontributory  Social History: negative alcohol, negative tobacco  Additional Past Medical and Surgical History: coronary  artery disease status post stent placement.      Allergies  Allergen Reactions  . Ciprofloxacin Diarrhea  . Lisinopril Other (See Comments)  . Penicillins     unknwon reaction.  tolerates amoxicillin  . Sulfa Antibiotics Itching and Rash    Rash    Current Outpatient Prescriptions  Medication Sig Dispense Refill  . aspirin 81 MG tablet Take 81 mg by mouth daily.    Marland Kitchen atorvastatin (LIPITOR) 10 MG tablet Take 10 mg by mouth daily.    . finasteride (PROSCAR) 5 MG tablet Take 5 mg by mouth daily.    . fluticasone (FLONASE) 50 MCG/ACT nasal spray Place 2 sprays into both nostrils daily. 16 g 6  . hydrochlorothiazide (HYDRODIURIL) 25 MG tablet Take 25 mg by mouth daily.    . isosorbide dinitrate (ISORDIL) 30 MG tablet Take 30 mg by mouth every morning.    . metoprolol (LOPRESSOR) 100 MG tablet Take 100 mg by mouth 2 (two) times daily.    . nitrofurantoin, macrocrystal-monohydrate, (MACROBID) 100 MG capsule Take 1 capsule (100 mg total) by mouth every 12 (twelve) hours. 14 capsule 0  . nitroGLYCERIN (NITROSTAT) 0.4 MG SL tablet Place 0.4 mg under  the tongue every 5 (five) minutes as needed for chest pain.    Marland Kitchen omeprazole (PRILOSEC) 40 MG capsule Take 40 mg by mouth 2 (two) times daily.    . ramipril (ALTACE) 2.5 MG capsule Take 2.5 mg by mouth daily.    . ranitidine (ZANTAC) 150 MG tablet Take 150 mg by mouth 2 (two) times daily.    . sucralfate (CARAFATE) 1 G tablet Take 1 g by mouth 2 (two) times daily.    . tadalafil (CIALIS) 5 MG tablet Take by mouth.    . tamsulosin (FLOMAX) 0.4 MG CAPS capsule Take 0.4 mg by mouth daily.    . temazepam (RESTORIL) 30 MG capsule take 1 capsule by mouth at bedtime if needed 90 capsule 1  . zolpidem (AMBIEN) 10 MG tablet Take by mouth.    Marland Kitchen  cyanocobalamin 1000 MCG tablet Take by mouth.     Current Facility-Administered Medications  Medication Dose Route Frequency Provider Last Rate Last Dose  . lidocaine (XYLOCAINE) 2 % jelly 1 application  1 application Urethral Once Collier Flowers, MD        OBJECTIVE:  Filed Vitals:   09/11/15 1344  BP: 106/57  Pulse: 85  Temp: 98.7 F (37.1 C)  Resp: 18     Body mass index is 32.73 kg/(m^2).    ECOG FS:1 - Symptomatic but completely ambulatory  PHYSICAL EXAM: GENERAL:  Well developed, well nourished, sitting comfortably in the exam room in no acute distress. MENTAL STATUS:  Alert and oriented to person, place and time.  ENT:  Oropharynx clear without lesion.  Tongue normal. Mucous membranes moist.  RESPIRATORY:  Clear to auscultation without rales, wheezes or rhonchi. CARDIOVASCULAR:  Regular rate and rhythm without murmur, rub or gallop. BREAST:  Right breast without masses, skin changes or nipple discharge.  Left breast without masses, skin changes or nipple discharge. ABDOMEN:  Soft, non-tender, with active bowel sounds, and no hepatosplenomegaly.  No masses. BACK:  No CVA tenderness.  No tenderness on percussion of the back or rib cage. SKIN:  No rashes, ulcers or lesions. EXTREMITIES: No edema, no skin discoloration or tenderness.  No palpable  cords. LYMPH NODES: No palpable cervical, supraclavicular, axillary or inguinal adenopathy  NEUROLOGICAL: Unremarkable. PSYCH:  Appropriate.   LAB RESULTS:  CBC Latest Ref Rng 09/11/2015 04/10/2015  WBC 3.8 - 10.6 K/uL 5.1 5.5  Hemoglobin 13.0 - 18.0 g/dL 12.6(L) 12.5(L)  Hematocrit 40.0 - 52.0 % 38.1(L) 38.0(L)  Platelets 150 - 440 K/uL 235 205    Infusion on 09/11/2015  Component Date Value Ref Range Status  . WBC 09/11/2015 5.1  3.8 - 10.6 K/uL Final  . RBC 09/11/2015 4.38* 4.40 - 5.90 MIL/uL Final  . Hemoglobin 09/11/2015 12.6* 13.0 - 18.0 g/dL Final  . HCT 09/11/2015 38.1* 40.0 - 52.0 % Final  . MCV 09/11/2015 87.1  80.0 - 100.0 fL Final  . MCH 09/11/2015 28.7  26.0 - 34.0 pg Final  . MCHC 09/11/2015 33.0  32.0 - 36.0 g/dL Final  . RDW 09/11/2015 15.2* 11.5 - 14.5 % Final  . Platelets 09/11/2015 235  150 - 440 K/uL Final  . Neutrophils Relative % 09/11/2015 67   Final  . Neutro Abs 09/11/2015 3.4  1.4 - 6.5 K/uL Final  . Lymphocytes Relative 09/11/2015 10   Final  . Lymphs Abs 09/11/2015 0.5* 1.0 - 3.6 K/uL Final  . Monocytes Relative 09/11/2015 21   Final  . Monocytes Absolute 09/11/2015 1.1* 0.2 - 1.0 K/uL Final  . Eosinophils Relative 09/11/2015 2   Final  . Eosinophils Absolute 09/11/2015 0.1  0 - 0.7 K/uL Final  . Basophils Relative 09/11/2015 0   Final  . Basophils Absolute 09/11/2015 0.0  0 - 0.1 K/uL Final  . Sodium 09/11/2015 133* 135 - 145 mmol/L Final  . Potassium 09/11/2015 3.5  3.5 - 5.1 mmol/L Final  . Chloride 09/11/2015 101  101 - 111 mmol/L Final  . CO2 09/11/2015 24  22 - 32 mmol/L Final  . Glucose, Bld 09/11/2015 113* 65 - 99 mg/dL Final  . BUN 09/11/2015 27* 6 - 20 mg/dL Final  . Creatinine, Ser 09/11/2015 1.59* 0.61 - 1.24 mg/dL Final  . Calcium 09/11/2015 8.9  8.9 - 10.3 mg/dL Final  . Total Protein 09/11/2015 6.7  6.5 - 8.1 g/dL Final  . Albumin  09/11/2015 3.8  3.5 - 5.0 g/dL Final  . AST 09/11/2015 20  15 - 41 U/L Final  . ALT 09/11/2015 23   17 - 63 U/L Final  . Alkaline Phosphatase 09/11/2015 60  38 - 126 U/L Final  . Total Bilirubin 09/11/2015 0.5  0.3 - 1.2 mg/dL Final  . GFR calc non Af Amer 09/11/2015 39* >60 mL/min Final  . GFR calc Af Amer 09/11/2015 45* >60 mL/min Final   Comment: (NOTE) The eGFR has been calculated using the CKD EPI equation. This calculation has not been validated in all clinical situations. eGFR's persistently <60 mL/min signify possible Chronic Kidney Disease.   . Anion gap 09/11/2015 8  5 - 15 Final  . LDH 09/11/2015 174  98 - 192 U/L Final  Clinical Support on 09/08/2015  Component Date Value Ref Range Status  . Result 1 09/08/2015 Gram negative rods*  Final   Greater than 100,000 colony forming units per mL  . Specific Gravity, UA 09/08/2015 1.020  1.005 - 1.030 Final  . pH, UA 09/08/2015 6.0  5.0 - 7.5 Final  . Color, UA 09/08/2015 Yellow  Yellow Final  . Appearance Ur 09/08/2015 Cloudy* Clear Final  . Leukocytes, UA 09/08/2015 3+* Negative Final  . Protein, UA 09/08/2015 2+* Negative/Trace Final  . Glucose, UA 09/08/2015 Negative  Negative Final  . Ketones, UA 09/08/2015 Negative  Negative Final  . RBC, UA 09/08/2015 3+* Negative Final  . Bilirubin, UA 09/08/2015 Negative  Negative Final  . Urobilinogen, Ur 09/08/2015 0.2  0.2 - 1.0 mg/dL Final  . Nitrite, UA 09/08/2015 Positive* Negative Final  . Microscopic Examination 09/08/2015 See below:   Final  . WBC, UA 09/08/2015 >30* 0 -  5 /hpf Final  . RBC, UA 09/08/2015 None seen  0 -  2 /hpf Final  . Epithelial Cells (non renal) 09/08/2015 None seen  0 - 10 /hpf Final  . Bacteria, UA 09/08/2015 Many* None seen/Few Final        ASSESSMENT: Diffuse B large cell lymphoma   1. Diffuse large cell lymphoma.with possible testicular involvement based on PET scan 2.testicular biopsy was negative for lymphoma. 3.  Status post suprapubic cystostomy because of bladder neck muscle dysfunction  MEDICAL DECISION MAKING:  seum  creatinine is  slightly elevated. In view of that rrepeat staging it be done by PET scanning.  If that PET scan is within normal limit no further investigation may be needed.  Patient expressed understanding and was in agreement with this plan. He also understands that He can call clinic at any time with any questions, concerns, or complaints.    No matching staging information was found for the patient.  Janak Choksi, MD   09/11/2015 2:10 PM     

## 2015-09-12 ENCOUNTER — Telehealth: Payer: Self-pay

## 2015-09-12 ENCOUNTER — Ambulatory Visit: Payer: Medicare Other

## 2015-09-12 DIAGNOSIS — N390 Urinary tract infection, site not specified: Secondary | ICD-10-CM

## 2015-09-12 LAB — CULTURE, URINE COMPREHENSIVE

## 2015-09-12 MED ORDER — CIPROFLOXACIN HCL 500 MG PO TABS: 500.0000 mg | ORAL_TABLET | Freq: Two times a day (BID) | ORAL | Status: DC

## 2015-09-12 NOTE — Telephone Encounter (Signed)
Per Dr. Pilar Jarvis pt was given cipro 500 once daily for a week. Made pt aware medication may change depending on the ucx sensitivities. Pt voiced understanding. Medication sent to pt pharmacy.

## 2015-09-15 ENCOUNTER — Telehealth: Payer: Self-pay

## 2015-09-15 ENCOUNTER — Ambulatory Visit
Admission: RE | Admit: 2015-09-15 | Discharge: 2015-09-15 | Disposition: A | Discharge status: Home / Self Care | Payer: Medicare Other | Source: Ambulatory Visit | Attending: Oncology | Admitting: Oncology

## 2015-09-15 ENCOUNTER — Other Ambulatory Visit: Payer: Self-pay

## 2015-09-15 DIAGNOSIS — C8330 Diffuse large B-cell lymphoma, unspecified site: Secondary | ICD-10-CM | POA: Insufficient documentation

## 2015-09-15 DIAGNOSIS — N390 Urinary tract infection, site not specified: Secondary | ICD-10-CM

## 2015-09-15 DIAGNOSIS — C8590 Non-Hodgkin lymphoma, unspecified, unspecified site: Secondary | ICD-10-CM | POA: Diagnosis not present

## 2015-09-15 DIAGNOSIS — I2510 Atherosclerotic heart disease of native coronary artery without angina pectoris: Secondary | ICD-10-CM | POA: Diagnosis not present

## 2015-09-15 DIAGNOSIS — Z01812 Encounter for preprocedural laboratory examination: Secondary | ICD-10-CM | POA: Insufficient documentation

## 2015-09-15 DIAGNOSIS — R05 Cough: Secondary | ICD-10-CM | POA: Insufficient documentation

## 2015-09-15 DIAGNOSIS — J3489 Other specified disorders of nose and nasal sinuses: Secondary | ICD-10-CM | POA: Insufficient documentation

## 2015-09-15 LAB — GLUCOSE, CAPILLARY: GLUCOSE-CAPILLARY: 72 mg/dL (ref 65–99)

## 2015-09-15 LAB — BLADDER SCAN AMB NON-IMAGING: SCAN RESULT: 320

## 2015-09-15 MED ORDER — CIPROFLOXACIN HCL 250 MG PO TABS: 250.0000 mg | ORAL_TABLET | Freq: Two times a day (BID) | ORAL | Status: DC

## 2015-09-15 MED ORDER — FLUDEOXYGLUCOSE F - 18 (FDG) INJECTION
735.0000 | Freq: Once | INTRAVENOUS | Status: AC | PRN
  Administered 2015-09-15: 12.63 via INTRAVENOUS

## 2015-09-15 NOTE — Telephone Encounter (Signed)
-----   Message from Nori Riis, PA-C sent at 09/14/2015  9:51 PM EST ----- Patient was notified and Dr. Pilar Jarvis prescribed Cipro.

## 2015-09-15 NOTE — Telephone Encounter (Signed)
Pt came in this morning wanting a copy of his ucx results. A copy was given. Reinforced with pt to cath 4x daily and make sure he gets all the urine out. Provided pt with a closed system self cath catheters. Showed pt how to use them. Pt will call tomorrow morning to let us know if he likes the closed system so they can be ordered.

## 2015-09-17 ENCOUNTER — Telehealth: Payer: Self-pay | Admitting: *Deleted

## 2015-09-17 NOTE — Telephone Encounter (Signed)
PET scan is negative. Pt needs to keep appointments as scheduled.

## 2015-09-17 NOTE — Telephone Encounter (Signed)
Called patient and left message that PET scan was negative.  He should keep his appointments as scheduled.

## 2015-09-18 ENCOUNTER — Ambulatory Visit
Admission: RE | Admit: 2015-09-18 | Discharge: 2015-09-18 | Disposition: A | Discharge status: Home / Self Care | Payer: Medicare Other | Source: Ambulatory Visit | Attending: Family Medicine | Admitting: Family Medicine

## 2015-09-18 ENCOUNTER — Ambulatory Visit (INDEPENDENT_AMBULATORY_CARE_PROVIDER_SITE_OTHER): Payer: Medicare Other | Admitting: Family Medicine

## 2015-09-18 ENCOUNTER — Encounter: Payer: Self-pay | Admitting: Family Medicine

## 2015-09-18 VITALS — BP 98/62 | HR 96 | Temp 97.6°F | Resp 20 | Wt 249.0 lb

## 2015-09-18 DIAGNOSIS — Z01812 Encounter for preprocedural laboratory examination: Secondary | ICD-10-CM | POA: Diagnosis not present

## 2015-09-18 DIAGNOSIS — R05 Cough: Secondary | ICD-10-CM

## 2015-09-18 DIAGNOSIS — M542 Cervicalgia: Secondary | ICD-10-CM

## 2015-09-18 DIAGNOSIS — J3489 Other specified disorders of nose and nasal sinuses: Secondary | ICD-10-CM | POA: Diagnosis not present

## 2015-09-18 DIAGNOSIS — C8330 Diffuse large B-cell lymphoma, unspecified site: Secondary | ICD-10-CM | POA: Diagnosis not present

## 2015-09-18 DIAGNOSIS — R0981 Nasal congestion: Secondary | ICD-10-CM

## 2015-09-18 DIAGNOSIS — R059 Cough, unspecified: Secondary | ICD-10-CM

## 2015-09-18 DIAGNOSIS — I2510 Atherosclerotic heart disease of native coronary artery without angina pectoris: Secondary | ICD-10-CM | POA: Diagnosis not present

## 2015-09-18 MED ORDER — MONTELUKAST SODIUM 10 MG PO TABS: 10.0000 mg | ORAL_TABLET | Freq: Every day | ORAL | Status: DC

## 2015-09-18 NOTE — Progress Notes (Signed)
Patient ID: Bryan Nelson, male   DOB: [birth year], 82 y.o.   MRN: [id number]       Patient: Bryan Nelson Male    DOB: May 18, 1932   82 y.o.   MRN: [id number] Visit Date: 09/18/2015  Today's Provider: Lelon Huh, MD   Chief Complaint  Patient presents with  . Cough    worsened over the past 2 days.    Subjective:    Cough This is a new problem. The current episode started more than 1 month ago. The problem has been gradually worsening. The problem occurs constantly. The cough is productive of sputum (yellow in color ). Associated symptoms include chest pain, nasal congestion, postnasal drip and shortness of breath. Pertinent negatives include no fever or wheezing. Nothing aggravates the symptoms. He has tried OTC cough suppressant for the symptoms. The treatment provided no relief.  Has had cough off and on for the last 3 months associated with nasal congestion and drainage. He was prescribed Fluticasone nasal spray in October which he has taken intermittently, but can't really tell that it is helping. The congestion makes it difficult to breath through his nose at night and makes it difficult to sleep.   Patient is currently taking Cipro due to a UTI. He reports that he still has 2 days left. He has been on several antibiotics including Levaquin and macrodantin over the last few months for chronic UTI. Patient reports that his cough makes his upper body ache. He reports that he has also been using a nasal spray to help with his cold symptoms with no relief.      Allergies  Allergen Reactions  . Ciprofloxacin Diarrhea  . Lisinopril Other (See Comments)  . Penicillins     unknwon reaction.  tolerates amoxicillin  . Sulfa Antibiotics Itching and Rash    Rash   Previous Medications   ASPIRIN 81 MG TABLET    Take 81 mg by mouth daily.   ATORVASTATIN (LIPITOR) 10 MG TABLET    Take 10 mg by mouth daily.   CIPROFLOXACIN (CIPRO) 250 MG TABLET    Take 1 tablet (250 mg total) by mouth 2  (two) times daily.   CYANOCOBALAMIN 1000 MCG TABLET    Take by mouth.   FINASTERIDE (PROSCAR) 5 MG TABLET    Take 5 mg by mouth daily.   FLUTICASONE (FLONASE) 50 MCG/ACT NASAL SPRAY    Place 2 sprays into both nostrils daily.   HYDROCHLOROTHIAZIDE (HYDRODIURIL) 25 MG TABLET    Take 25 mg by mouth daily.   ISOSORBIDE DINITRATE (ISORDIL) 30 MG TABLET    Take 30 mg by mouth every morning.   METOPROLOL (LOPRESSOR) 100 MG TABLET    Take 100 mg by mouth 2 (two) times daily.   NITROFURANTOIN, MACROCRYSTAL-MONOHYDRATE, (MACROBID) 100 MG CAPSULE    Take 1 capsule (100 mg total) by mouth every 12 (twelve) hours.   NITROGLYCERIN (NITROSTAT) 0.4 MG SL TABLET    Place 0.4 mg under the tongue every 5 (five) minutes as needed for chest pain.   OMEPRAZOLE (PRILOSEC) 40 MG CAPSULE    Take 40 mg by mouth 2 (two) times daily.   RAMIPRIL (ALTACE) 2.5 MG CAPSULE    Take 2.5 mg by mouth daily.   RANITIDINE (ZANTAC) 150 MG TABLET    Take 150 mg by mouth 2 (two) times daily.   SUCRALFATE (CARAFATE) 1 G TABLET    Take 1 g by mouth 2 (two) times daily.   TADALAFIL (CIALIS) 5  MG TABLET    Take by mouth.   TAMSULOSIN (FLOMAX) 0.4 MG CAPS CAPSULE    Take 0.4 mg by mouth daily.   TEMAZEPAM (RESTORIL) 30 MG CAPSULE    take 1 capsule by mouth at bedtime if needed   ZOLPIDEM (AMBIEN) 10 MG TABLET    Take by mouth.    Review of Systems  Constitutional: Positive for activity change and fatigue. Negative for fever.  HENT: Positive for postnasal drip.   Respiratory: Positive for cough and shortness of breath. Negative for wheezing.   Cardiovascular: Positive for chest pain.    Social History  Substance Use Topics  . Smoking status: Former Smoker -- 1.50 packs/day for 12 years    Types: Cigarettes    Quit date: 08/23/1981  . Smokeless tobacco: Never Used  . Alcohol Use: No   Objective:   BP 98/62 mmHg  Pulse 96  Temp(Src) 97.6 F (36.4 C)  Resp 20  Wt 249 lb (112.946 kg)  SpO2 96%  Physical Exam  General  Appearance:    Alert, cooperative, no distress  HENT:   ENT exam normal, no neck nodes or sinus tenderness, neck without nodes, throat normal without erythema or exudate, sinuses nontender, post nasal drip noted and nasal mucosa pale and congested  Eyes:    PERRL, conjunctiva/corneas clear, EOM's intact       Lungs:     Occasional rhonchi which clears with coughing, respirations unlabored  Heart:    Regular rate and rhythm  Neurologic:   Awake, alert, oriented x 3. No apparent focal neurological           defect.           Assessment & Plan:     1. Cough Possible secondary to post nasal discharge, Xr to rule out intrinsic pulmonary disease.  - DG Chest 2 View; Future  2. Head congestion No significant relief with fluticasone nasal spray.  - montelukast (SINGULAIR) 10 MG tablet; Take 1 tablet (10 mg total) by mouth at bedtime.  Dispense: 30 tablet; Refill: 3  3. Neck pain Was previously seen at [facility]. Considering referral back for further evluation.  Patient Instructions  Take Allegra (fexofenadine) 180mg  once a day for congestion and sinus drainage.   Call for prescription for different antibiotic if not much better over the weekend.            Lelon Huh, MD  [facility]

## 2015-09-18 NOTE — Patient Instructions (Addendum)
Take Allegra (fexofenadine) 180mg  once a day for congestion and sinus drainage.   Call for prescription for different antibiotic if not much better over the weekend.

## 2015-09-23 ENCOUNTER — Ambulatory Visit (INDEPENDENT_AMBULATORY_CARE_PROVIDER_SITE_OTHER): Payer: Medicare Other | Admitting: Urology

## 2015-09-23 ENCOUNTER — Encounter: Payer: Self-pay | Admitting: Urology

## 2015-09-23 VITALS — BP 136/71 | HR 71 | Ht 73.0 in | Wt 246.9 lb

## 2015-09-23 DIAGNOSIS — N390 Urinary tract infection, site not specified: Secondary | ICD-10-CM | POA: Diagnosis not present

## 2015-09-23 DIAGNOSIS — R339 Retention of urine, unspecified: Secondary | ICD-10-CM | POA: Diagnosis not present

## 2015-09-23 LAB — URINALYSIS, COMPLETE
Bilirubin, UA: NEGATIVE
GLUCOSE, UA: NEGATIVE
KETONES UA: NEGATIVE
Leukocytes, UA: NEGATIVE
NITRITE UA: NEGATIVE
RBC UA: NEGATIVE
Specific Gravity, UA: 1.02 (ref 1.005–1.030)
UUROB: 0.2 mg/dL (ref 0.2–1.0)
pH, UA: 6 (ref 5.0–7.5)

## 2015-09-23 LAB — MICROSCOPIC EXAMINATION: Epithelial Cells (non renal): NONE SEEN /hpf (ref 0–10)

## 2015-09-23 NOTE — Progress Notes (Signed)
09/23/2015 4:17 PM   Bryan Nelson 1932/07/07 [id number]  Referring provider: [name], MD [address] [place], [place] [address]  Chief Complaint  Patient presents with  . Follow-up    from uti    HPI: Patient is an 82 year old Caucasian male who is following up after being diagnosed with a Pseudomonas aeruginosa UTI.  Today, he states he is no longer having symptoms of urinary tract infections. His UA today is unremarkable.  He is cathing himself 2-3 times daily and adverse to keep his postvoid residuals below 200 cc.  He is not having any difficulty at this time.  He specifically denies hematuria, dysuria and suprapubic pain.  He did admit to having suprapubic pain prior to cathing and receiving residuals around 200 cc.  He has not had any recent fevers, chills, nausea or vomiting.     PMH: Past Medical History  Diagnosis Date  . Heart disease   . Cancer ([place]) 2015    Lymphoma  . Helicobacter pylori infection 06/18/2015    by EGD RX 08/18/1999   . History of adenomatous polyp of colon 06/18/2015  . Arteriosclerosis of coronary artery 08/26/2011    Overview:      a.  1999 PCI of the mid LAD with stent.      b.  2002 Cath [place]: EF 56%. RCA-distal 25/25%. Left main-50% ostial.  Left circumflex-25% OM2.  LAD-25% proximal.  75% mid.  25/25% distal. 75% D1.      c.  07/2011 PCI of RCA with DES.[place]   . BP (high blood pressure) 08/26/2011  . Bleeding gastrointestinal 08/26/2011    Overview:      a.  Chronic abdominal pain, present improving.      b.  Duodenitis and gastritis by EGD in 2000.      c.  Pylori in 2000, treated.      d.  Gastroesophageal reflux disease.      e.  Diverticular disease.      Surgical History: Past Surgical History  Procedure Laterality Date  . Heart stent placement      1998, 2000, 2012  . Green light laser turp (transurethral resection of prostate N/A 04/16/2015    Procedure: GREEN LIGHT LASER TURP (TRANSURETHRAL RESECTION OF  PROSTATE WITH BLADDER BIOPSY;  Surgeon: Collier Flowers, MD;  Location: [facility] ORS;  Service: Urology;  Laterality: N/A;  . Angioplasty    . Cataract extraction      Home Medications:    Medication List       This list is accurate as of: 09/23/15  4:17 PM.  Always use your most recent med list.               aspirin 81 MG tablet  Take 81 mg by mouth daily.     atorvastatin 10 MG tablet  Commonly known as:  LIPITOR  Take 10 mg by mouth daily.     CIALIS 5 MG tablet  Generic drug:  tadalafil  Take by mouth.     ciprofloxacin 250 MG tablet  Commonly known as:  CIPRO  Take 1 tablet (250 mg total) by mouth 2 (two) times daily.     cyanocobalamin 1000 MCG tablet  Take by mouth. Reported on 09/23/2015     finasteride 5 MG tablet  Commonly known as:  PROSCAR  Take 5 mg by mouth daily.     fluticasone 50 MCG/ACT nasal spray  Commonly known as:  FLONASE  Place 2 sprays into both nostrils daily.     hydrochlorothiazide 25 MG tablet  Commonly known as:  HYDRODIURIL  Take 25 mg by mouth daily.     isosorbide dinitrate 30 MG tablet  Commonly known as:  ISORDIL  Take 30 mg by mouth every morning.     metoprolol 100 MG tablet  Commonly known as:  LOPRESSOR  Take 100 mg by mouth 2 (two) times daily.     montelukast 10 MG tablet  Commonly known as:  SINGULAIR  Take 1 tablet (10 mg total) by mouth at bedtime.     nitrofurantoin (macrocrystal-monohydrate) 100 MG capsule  Commonly known as:  MACROBID  Take 1 capsule (100 mg total) by mouth every 12 (twelve) hours.     nitroGLYCERIN 0.4 MG SL tablet  Commonly known as:  NITROSTAT  Place 0.4 mg under the tongue every 5 (five) minutes as needed for chest pain.     omeprazole 40 MG capsule  Commonly known as:  PRILOSEC  Take 40 mg by mouth 2 (two) times daily.     ramipril 2.5 MG capsule  Commonly known as:  ALTACE  Take 2.5 mg by mouth daily.     sucralfate 1 g tablet  Commonly known as:  CARAFATE  Take 1 g by mouth 2  (two) times daily.     tamsulosin 0.4 MG Caps capsule  Commonly known as:  FLOMAX  Take 0.4 mg by mouth daily.     temazepam 30 MG capsule  Commonly known as:  RESTORIL  take 1 capsule by mouth at bedtime if needed     ZANTAC 150 MG tablet  Generic drug:  ranitidine  Take 150 mg by mouth 2 (two) times daily.     zolpidem 10 MG tablet  Commonly known as:  AMBIEN  Take by mouth.        Allergies:  Allergies  Allergen Reactions  . Ciprofloxacin Diarrhea  . Lisinopril Other (See Comments)  . Penicillins     unknwon reaction.  tolerates amoxicillin  . Sulfa Antibiotics Itching and Rash    Rash    Family History: Family History  Problem Relation Age of Onset  . Osteoporosis Mother   . Alzheimer's disease Father   . Kidney disease Neg Hx   . Prostate cancer Neg Hx     Social History:  reports that he quit smoking about 34 years ago. His smoking use included Cigarettes. He has a 18 pack-year smoking history. He has never used smokeless tobacco. He reports that he does not drink alcohol or use illicit drugs.  ROS: UROLOGY Frequent Urination?: No Hard to postpone urination?: No Burning/pain with urination?: No Get up at night to urinate?: No Leakage of urine?: No Urine stream starts and stops?: No Trouble starting stream?: No Do you have to strain to urinate?: No Blood in urine?: No Urinary tract infection?: No Sexually transmitted disease?: No Injury to kidneys or bladder?: No Painful intercourse?: No Weak stream?: No Erection problems?: No Penile pain?: No  Gastrointestinal Nausea?: No Vomiting?: No Indigestion/heartburn?: No Diarrhea?: No Constipation?: No  Constitutional Fever: No Night sweats?: No Weight loss?: No Fatigue?: No  Skin Skin rash/lesions?: No Itching?: No  Eyes Blurred vision?: No Double vision?: No  Ears/Nose/Throat Sore throat?: No Sinus problems?: No  Hematologic/Lymphatic Swollen glands?: No Easy bruising?:  No  Cardiovascular Leg swelling?: No Chest pain?: No  Respiratory Cough?: No Shortness of breath?: No  Endocrine Excessive thirst?: No  Musculoskeletal Back pain?:  No Joint pain?: No  Neurological Headaches?: No Dizziness?: No  Psychologic Depression?: No Anxiety?: No  Physical Exam: BP 136/71 mmHg  Pulse 71  Ht 6\' 1"  (1.854 m)  Wt 246 lb 14.4 oz (111.993 kg)  BMI 32.58 kg/m2  Constitutional: Well nourished. Alert and oriented, No acute distress. HEENT: [place] AT, moist mucus membranes. Trachea midline, no masses. Cardiovascular: No clubbing, cyanosis, or edema. Respiratory: Normal respiratory effort, no increased work of breathing. Skin: No rashes, bruises or suspicious lesions. Lymph: No cervical or inguinal adenopathy. Neurologic: Grossly intact, no focal deficits, moving all 4 extremities. Psychiatric: Normal mood and affect.  Laboratory Data: Lab Results  Component Value Date   WBC 5.1 09/11/2015   HGB 12.6* 09/11/2015   HCT 38.1* 09/11/2015   MCV 87.1 09/11/2015   PLT 235 09/11/2015    Lab Results  Component Value Date   CREATININE 1.59* 09/11/2015    Lab Results  Component Value Date   PSA 0.9 02/27/2014    Lab Results  Component Value Date   AST 20 09/11/2015   Lab Results  Component Value Date   ALT 23 09/11/2015    Urinalysis Results for orders placed or performed in visit on 09/23/15  Microscopic Examination  Result Value Ref Range   WBC, UA 0-5 0 -  5 /hpf   RBC, UA 0-2 0 -  2 /hpf   Epithelial Cells (non renal) None seen 0 - 10 /hpf   Mucus, UA Present (A) Not Estab.   Bacteria, UA Few (A) None seen/Few  Urinalysis, Complete  Result Value Ref Range   Specific Gravity, UA 1.020 1.005 - 1.030   pH, UA 6.0 5.0 - 7.5   Color, UA Yellow Yellow   Appearance Ur Clear Clear   Leukocytes, UA Negative Negative   Protein, UA Trace (A) Negative/Trace   Glucose, UA Negative Negative   Ketones, UA Negative Negative   RBC, UA  Negative Negative   Bilirubin, UA Negative Negative   Urobilinogen, Ur 0.2 0.2 - 1.0 mg/dL   Nitrite, UA Negative Negative   Microscopic Examination See below:      Assessment & Plan:    1.  UTI:  Resolved.  He will contact us with any UTI symptoms.  His UA was unremarkable on today's visit.    - Urinalysis, Complete  2. Urinary retention:   Patient will continue CIC, keeping his PVR's 200 cc and below.  He will RTC in June for exam and log book review.     Return for keep appointment on 02/03/2016.  These notes generated with voice recognition software. I apologize for typographical errors.  Zara Council, [facility] [address] [place], [place] [address] [phone]

## 2015-09-24 DIAGNOSIS — R339 Retention of urine, unspecified: Secondary | ICD-10-CM | POA: Insufficient documentation

## 2015-10-09 ENCOUNTER — Ambulatory Visit (INDEPENDENT_AMBULATORY_CARE_PROVIDER_SITE_OTHER): Payer: Medicare Other | Admitting: Obstetrics and Gynecology

## 2015-10-09 ENCOUNTER — Encounter: Payer: Self-pay | Admitting: Obstetrics and Gynecology

## 2015-10-09 VITALS — BP 149/75 | HR 67 | Resp 16 | Ht 73.0 in | Wt 255.9 lb

## 2015-10-09 DIAGNOSIS — N390 Urinary tract infection, site not specified: Secondary | ICD-10-CM

## 2015-10-09 DIAGNOSIS — R339 Retention of urine, unspecified: Secondary | ICD-10-CM

## 2015-10-09 DIAGNOSIS — R300 Dysuria: Secondary | ICD-10-CM

## 2015-10-09 LAB — MICROSCOPIC EXAMINATION: Epithelial Cells (non renal): NONE SEEN /hpf (ref 0–10)

## 2015-10-09 LAB — URINALYSIS, COMPLETE
BILIRUBIN UA: NEGATIVE
GLUCOSE, UA: NEGATIVE
Ketones, UA: NEGATIVE
NITRITE UA: POSITIVE — AB
PH UA: 6 (ref 5.0–7.5)
Specific Gravity, UA: 1.015 (ref 1.005–1.030)
UUROB: 0.2 mg/dL (ref 0.2–1.0)

## 2015-10-09 MED ORDER — NITROFURANTOIN MONOHYD MACRO 100 MG PO CAPS
100.0000 mg | ORAL_CAPSULE | Freq: Two times a day (BID) | ORAL | Status: DC
Start: 2015-10-09 — End: 2015-12-15

## 2015-10-09 NOTE — Progress Notes (Signed)
2:30 PM   Bryan Nelson 05-15-1932 [id number]  Referring provider: [name], MD [address] [place], [place] [address]  Chief Complaint  Patient presents with  . Dysuria    HPI: Patient is an 82 year old Caucasian male who is following up after being diagnosed with a Pseudomonas aeruginosa UTI.  Today, he states he is no longer having symptoms of urinary tract infections. His UA today is unremarkable.  He is cathing himself 2-3 times daily and adverse to keep his postvoid residuals below 200 cc.  He is not having any difficulty at this time.  He specifically denies hematuria, dysuria and suprapubic pain.  He did admit to having suprapubic pain prior to cathing and receiving residuals around 200 cc.  He has not had any recent fevers, chills, nausea or vomiting.    Interval History 10/09/15 Dysuria and frequency since yesterday. No fevers, flank pain or gross hematuria. Concerned that he may have another urinary tract infection.  PMH: Past Medical History  Diagnosis Date  . Heart disease   . Cancer ([place]) 2015    Lymphoma  . Helicobacter pylori infection 06/18/2015    by EGD RX 08/18/1999   . History of adenomatous polyp of colon 06/18/2015  . Arteriosclerosis of coronary artery 08/26/2011    Overview:      a.  1999 PCI of the mid LAD with stent.      b.  2002 Cath [place]: EF 56%. RCA-distal 25/25%. Left main-50% ostial.  Left circumflex-25% OM2.  LAD-25% proximal.  75% mid.  25/25% distal. 75% D1.      c.  07/2011 PCI of RCA with DES.[place]   . BP (high blood pressure) 08/26/2011  . Bleeding gastrointestinal 08/26/2011    Overview:      a.  Chronic abdominal pain, present improving.      b.  Duodenitis and gastritis by EGD in 2000.      c.  Pylori in 2000, treated.      d.  Gastroesophageal reflux disease.      e.  Diverticular disease.      Surgical History: Past Surgical History  Procedure Laterality Date  . Heart stent placement      1998, 2000, 2012    . Green light laser turp (transurethral resection of prostate N/A 04/16/2015    Procedure: GREEN LIGHT LASER TURP (TRANSURETHRAL RESECTION OF PROSTATE WITH BLADDER BIOPSY;  Surgeon: Collier Flowers, MD;  Location: [facility] ORS;  Service: Urology;  Laterality: N/A;  . Angioplasty    . Cataract extraction      Home Medications:    Medication List       This list is accurate as of: 10/09/15  2:30 PM.  Always use your most recent med list.               aspirin 81 MG tablet  Take 81 mg by mouth daily.     atorvastatin 10 MG tablet  Commonly known as:  LIPITOR  Take 10 mg by mouth daily.     CIALIS 5 MG tablet  Generic drug:  tadalafil  Take by mouth.     cyanocobalamin 1000 MCG tablet  Take by mouth. Reported on 09/23/2015     finasteride 5 MG tablet  Commonly known as:  PROSCAR  Take 5 mg by mouth daily.     fluticasone 50 MCG/ACT nasal spray  Commonly known as:  FLONASE  Place 2 sprays into both nostrils  daily.     hydrochlorothiazide 25 MG tablet  Commonly known as:  HYDRODIURIL  Take 25 mg by mouth daily.     isosorbide dinitrate 30 MG tablet  Commonly known as:  ISORDIL  Take 30 mg by mouth every morning.     metoprolol 100 MG tablet  Commonly known as:  LOPRESSOR  Take 100 mg by mouth 2 (two) times daily.     montelukast 10 MG tablet  Commonly known as:  SINGULAIR  Take 1 tablet (10 mg total) by mouth at bedtime.     nitroGLYCERIN 0.4 MG SL tablet  Commonly known as:  NITROSTAT  Place 0.4 mg under the tongue every 5 (five) minutes as needed for chest pain.     omeprazole 40 MG capsule  Commonly known as:  PRILOSEC  Take 40 mg by mouth 2 (two) times daily.     ramipril 2.5 MG capsule  Commonly known as:  ALTACE  Take 2.5 mg by mouth daily.     sucralfate 1 g tablet  Commonly known as:  CARAFATE  Take 1 g by mouth 2 (two) times daily.     tamsulosin 0.4 MG Caps capsule  Commonly known as:  FLOMAX  Take 0.4 mg by mouth daily.     temazepam 30 MG  capsule  Commonly known as:  RESTORIL  take 1 capsule by mouth at bedtime if needed     ZANTAC 150 MG tablet  Generic drug:  ranitidine  Take 150 mg by mouth 2 (two) times daily.     zolpidem 10 MG tablet  Commonly known as:  AMBIEN  Take by mouth.        Allergies:  Allergies  Allergen Reactions  . Ciprofloxacin Diarrhea  . Lisinopril Other (See Comments)  . Penicillins     unknwon reaction.  tolerates amoxicillin  . Sulfa Antibiotics Itching and Rash    Rash    Family History: Family History  Problem Relation Age of Onset  . Osteoporosis Mother   . Alzheimer's disease Father   . Kidney disease Neg Hx   . Prostate cancer Neg Hx     Social History:  reports that he quit smoking about 34 years ago. His smoking use included Cigarettes. He has a 18 pack-year smoking history. He has never used smokeless tobacco. He reports that he does not drink alcohol or use illicit drugs.  ROS: UROLOGY Frequent Urination?: Yes Hard to postpone urination?: No Burning/pain with urination?: Yes Get up at night to urinate?: Yes Leakage of urine?: No Urine stream starts and stops?: No Trouble starting stream?: No Do you have to strain to urinate?: No Blood in urine?: No Urinary tract infection?: No Sexually transmitted disease?: No Injury to kidneys or bladder?: No Painful intercourse?: No Weak stream?: No Erection problems?: No Penile pain?: No  Gastrointestinal Nausea?: No Vomiting?: No Indigestion/heartburn?: No Diarrhea?: No Constipation?: No  Constitutional Fever: No Night sweats?: No Weight loss?: No Fatigue?: No  Skin Skin rash/lesions?: No Itching?: No  Eyes Blurred vision?: No Double vision?: No  Ears/Nose/Throat Sore throat?: No Sinus problems?: No  Hematologic/Lymphatic Swollen glands?: No Easy bruising?: No  Cardiovascular Leg swelling?: No Chest pain?: No  Respiratory Cough?: No Shortness of breath?: No  Endocrine Excessive thirst?:  No  Musculoskeletal Back pain?: No Joint pain?: No  Neurological Headaches?: No Dizziness?: No  Psychologic Depression?: No Anxiety?: No  Physical Exam: BP 149/75 mmHg  Pulse 67  Resp 16  Ht 6\' 1"  (1.854 m)  Wt 255 lb 14.4 oz (116.075 kg)  BMI 33.77 kg/m2  Constitutional: Well nourished. Alert and oriented, No acute distress. HEENT: [place] AT, moist mucus membranes. Trachea midline, no masses. Cardiovascular: No clubbing, cyanosis, or edema. Respiratory: Normal respiratory effort, no increased work of breathing. Skin: No rashes, bruises or suspicious lesions. Lymph: No cervical or inguinal adenopathy. Neurologic: Grossly intact, no focal deficits, moving all 4 extremities. Psychiatric: Normal mood and affect.  Laboratory Data: Lab Results  Component Value Date   WBC 5.1 09/11/2015   HGB 12.6* 09/11/2015   HCT 38.1* 09/11/2015   MCV 87.1 09/11/2015   PLT 235 09/11/2015    Lab Results  Component Value Date   CREATININE 1.59* 09/11/2015    Lab Results  Component Value Date   PSA 0.9 02/27/2014    Lab Results  Component Value Date   AST 20 09/11/2015   Lab Results  Component Value Date   ALT 23 09/11/2015    Urinalysis   Assessment & Plan:    1.  UTI:  UA suspicious for infection.  Sent for culture.  Patient provided prescription for Macrobid to hold and start taking if symptoms worsen. Recommended over-the-counter Cystex for dysuria. - Urinalysis, Complete  2. Urinary retention:   Patient will continue CIC, keeping his PVR's 200 cc and below.  He will RTC in June for exam and log book review.     No Follow-up on file.  These notes generated with voice recognition software. I apologize for typographical errors.  Herbert Moors, [facility] [address], [place], [place] [address] [phone]

## 2015-10-09 NOTE — Addendum Note (Signed)
Addended by: Bettye Boeck on: 10/09/2015 04:39 PM   Modules accepted: Orders

## 2015-10-11 LAB — CULTURE, URINE COMPREHENSIVE

## 2015-10-13 ENCOUNTER — Telehealth: Payer: Self-pay | Admitting: Family Medicine

## 2015-10-13 ENCOUNTER — Encounter: Payer: Self-pay | Admitting: Family Medicine

## 2015-10-13 ENCOUNTER — Telehealth: Payer: Self-pay

## 2015-10-13 DIAGNOSIS — N390 Urinary tract infection, site not specified: Secondary | ICD-10-CM

## 2015-10-13 MED ORDER — CIPROFLOXACIN HCL 500 MG PO TABS: 500.0000 mg | ORAL_TABLET | Freq: Two times a day (BID) | ORAL | Status: DC

## 2015-10-13 NOTE — Telephone Encounter (Signed)
-----   Message from Roda Shutters, [place] sent at 10/13/2015  8:12 AM EST ----- Please notify patient that his urine culture was positive for infection. The Macrobid I gave him may not work against this organism. I would like to prescribe him Cipro though it is listed under his allergies it does state that it only gets some diarrhea. Please ask the patient if he believes that he can tolerate this. Thanks

## 2015-10-13 NOTE — Telephone Encounter (Signed)
Pt states he has been using the nasal spay and the over the counter Allegra but he is sill having cough and congestion.  Pt does not feel like he is any better.  Pt is requesting a Rx to help with this.  [place]  [phone]

## 2015-10-13 NOTE — Telephone Encounter (Signed)
Please advise? Patient was seen in office 09/18/2015 for cough.

## 2015-10-13 NOTE — Telephone Encounter (Signed)
Can try tessalon 100mg  one every eight hours as needed for cough, #30, rf x 1. I don't anything else to try. If this does not help he will probably need to see a pulmonologist.

## 2015-10-13 NOTE — Telephone Encounter (Signed)
Spoke with pt in reference abx. Pt stated that he would be willing to take cipro and take imodium. Sent script into pt pharmacy.

## 2015-10-14 MED ORDER — BENZONATATE 100 MG PO CAPS: 100.0000 mg | ORAL_CAPSULE | Freq: Three times a day (TID) | ORAL | Status: DC

## 2015-10-14 NOTE — Telephone Encounter (Signed)
Rx sent to pharmacy. Left message notifying pt.

## 2015-10-14 NOTE — Telephone Encounter (Signed)
Pt has called back .  There is nothing at the pharmacy for him.  Please give him a call and let him know what Dr. Caryn Section wants to do.  His call back is [phone]  Thanks Con Memos

## 2015-10-22 ENCOUNTER — Ambulatory Visit: Payer: Medicare Other

## 2015-10-22 DIAGNOSIS — N390 Urinary tract infection, site not specified: Secondary | ICD-10-CM

## 2015-10-22 NOTE — Progress Notes (Signed)
Pt came in today to be instructed on use of speedicath flex. Joe, [employer] rep, assisted nurse in instructing pt on catheter use. Pt voiced understanding and a sample bag was provided.

## 2015-10-23 ENCOUNTER — Inpatient Hospital Stay: Payer: Medicare Other | Attending: Oncology

## 2015-10-31 ENCOUNTER — Other Ambulatory Visit: Payer: Medicare Other

## 2015-10-31 DIAGNOSIS — N390 Urinary tract infection, site not specified: Secondary | ICD-10-CM | POA: Diagnosis not present

## 2015-10-31 LAB — URINALYSIS, COMPLETE
Bilirubin, UA: NEGATIVE
GLUCOSE, UA: NEGATIVE
Ketones, UA: NEGATIVE
Nitrite, UA: NEGATIVE
PH UA: 6 (ref 5.0–7.5)
Specific Gravity, UA: 1.02 (ref 1.005–1.030)
UUROB: 0.2 mg/dL (ref 0.2–1.0)

## 2015-10-31 LAB — MICROSCOPIC EXAMINATION
BACTERIA UA: NONE SEEN
Epithelial Cells (non renal): NONE SEEN /hpf (ref 0–10)
WBC, UA: >30 /hpf — AB (ref 0–?)

## 2015-10-31 NOTE — Progress Notes (Signed)
Pt called stating he has been up all night feeling as though he has to urinate and is not able to. Pt stated when hes does CIC then only drops come out. Pt brought in a urine for u/a and ucx. Pt requested abx. Reinforced with pt ucx results are needed prior to rx an abx. Pt voiced understanding.

## 2015-11-03 ENCOUNTER — Telehealth: Payer: Self-pay

## 2015-11-03 DIAGNOSIS — N390 Urinary tract infection, site not specified: Secondary | ICD-10-CM

## 2015-11-03 LAB — CULTURE, URINE COMPREHENSIVE

## 2015-11-03 MED ORDER — LEVOFLOXACIN 250 MG PO TABS: 250.0000 mg | ORAL_TABLET | Freq: Every day | ORAL | Status: DC

## 2015-11-03 NOTE — Telephone Encounter (Signed)
Spoke with pt in reference to UTI. Made aware medication has been sent to pharmacy. Reinforced with pt to take imodium should he develop diarrhea. Pt voiced understanding.

## 2015-11-03 NOTE — Telephone Encounter (Signed)
I did not see this patient last, but his urine culture has completed.  It is positive for pseudomonas.  He will need to take Levaquin 250 mg daily for seven days.  His allergies list Cipro, but he had diarrhea with this antibiotic.  Hopefully, he will be able to tolerate the Levaquin.

## 2015-11-03 NOTE — Telephone Encounter (Signed)
Pt called requesting ucx results. I see the prelim is back. Pt states "I have to have some relief. I just cant take this."  Pt c/o severe dysuria, frequency of q28min, not able to sleep. Please advise.

## 2015-11-05 DIAGNOSIS — X32XXXA Exposure to sunlight, initial encounter: Secondary | ICD-10-CM | POA: Diagnosis not present

## 2015-11-05 DIAGNOSIS — L821 Other seborrheic keratosis: Secondary | ICD-10-CM | POA: Diagnosis not present

## 2015-11-05 DIAGNOSIS — L570 Actinic keratosis: Secondary | ICD-10-CM | POA: Diagnosis not present

## 2015-11-05 DIAGNOSIS — D1801 Hemangioma of skin and subcutaneous tissue: Secondary | ICD-10-CM | POA: Diagnosis not present

## 2015-11-25 ENCOUNTER — Ambulatory Visit (INDEPENDENT_AMBULATORY_CARE_PROVIDER_SITE_OTHER): Payer: Medicare Other

## 2015-11-25 DIAGNOSIS — R300 Dysuria: Secondary | ICD-10-CM

## 2015-11-25 LAB — URINALYSIS, COMPLETE
Bilirubin, UA: NEGATIVE
Glucose, UA: NEGATIVE
Ketones, UA: NEGATIVE
Leukocytes, UA: NEGATIVE
NITRITE UA: NEGATIVE
PH UA: 6 (ref 5.0–7.5)
Protein, UA: NEGATIVE
Specific Gravity, UA: 1.015 (ref 1.005–1.030)
UUROB: 0.2 mg/dL (ref 0.2–1.0)

## 2015-11-25 LAB — MICROSCOPIC EXAMINATION
BACTERIA UA: NONE SEEN
Epithelial Cells (non renal): NONE SEEN /hpf (ref 0–10)

## 2015-11-25 NOTE — Progress Notes (Signed)
In and Out Catheterization  Patient is present today for a I & O catheterization due to increased frequency and dysuria. Patient was cleaned and prepped in a sterile fashion with betadine and Lidocaine 2% jelly was instilled into the urethra.  A 14FR cath was inserted no complications were noted , 314ml of urine return was noted, urine was clear and yellow in color. A clean urine sample was collected for u/a and cx. Bladder was drained  And catheter was removed with out difficulty.    Preformed by: Toniann Fail, LPN   Follow up/ Additional notes: Pt voiced many concerns of recurrent UTI. Therefore pt will f/u with Larene [facility] next week.

## 2015-11-27 LAB — CULTURE, URINE COMPREHENSIVE

## 2015-11-28 ENCOUNTER — Telehealth: Payer: Self-pay

## 2015-11-28 NOTE — Telephone Encounter (Signed)
-----   Message from Nori Riis, PA-C sent at 11/27/2015  7:44 PM EDT ----- Patient's urine culture is negative.

## 2015-11-28 NOTE — Telephone Encounter (Signed)
LMOM- urine cx negative.

## 2015-12-02 ENCOUNTER — Telehealth: Payer: Self-pay

## 2015-12-02 DIAGNOSIS — R300 Dysuria: Secondary | ICD-10-CM

## 2015-12-02 MED ORDER — UROGESIC-BLUE 81.6 MG PO TABS: 1.0000 | ORAL_TABLET | Freq: Four times a day (QID) | ORAL | Status: DC

## 2015-12-02 NOTE — Telephone Encounter (Signed)
-----   Message from [facility] sent at 12/02/2015 10:10 AM EDT ----- Regarding: Bryan Nelson ContactQW:[phone] Vikki Ports,    Mr. Krauth called asking for you but wouldn't say why. He'd like a return phone call if possible.   Pt's ph# [phone] Thanks.

## 2015-12-02 NOTE — Telephone Encounter (Signed)
Spoke with pt in reference to previous call to [facility]. Pt stated that the urogesic blue previously given has helped tremendously and would like script for it. Made pt aware medication is very expensive but I have a coupon card he can use. Pt requested script be printed so he can find the cheapest pharmacy. Script was printed and coupon card left up front.

## 2015-12-04 ENCOUNTER — Inpatient Hospital Stay: Payer: Medicare Other | Attending: Oncology

## 2015-12-15 ENCOUNTER — Ambulatory Visit
Admission: RE | Admit: 2015-12-15 | Discharge: 2015-12-15 | Disposition: A | Discharge status: Home / Self Care | Payer: Medicare Other | Source: Ambulatory Visit | Attending: Urology | Admitting: Urology

## 2015-12-15 ENCOUNTER — Ambulatory Visit (INDEPENDENT_AMBULATORY_CARE_PROVIDER_SITE_OTHER): Payer: Medicare Other | Admitting: Urology

## 2015-12-15 VITALS — BP 148/73 | HR 63 | Ht 73.0 in | Wt 246.0 lb

## 2015-12-15 DIAGNOSIS — R339 Retention of urine, unspecified: Secondary | ICD-10-CM

## 2015-12-15 DIAGNOSIS — N2889 Other specified disorders of kidney and ureter: Secondary | ICD-10-CM | POA: Diagnosis not present

## 2015-12-15 DIAGNOSIS — R109 Unspecified abdominal pain: Secondary | ICD-10-CM | POA: Diagnosis not present

## 2015-12-15 DIAGNOSIS — N390 Urinary tract infection, site not specified: Secondary | ICD-10-CM

## 2015-12-15 NOTE — Progress Notes (Signed)
2:28 PM   Bryan Nelson May 16, 1932 [id number]  Referring provider: [name], MD [address] [place],  [address]  Chief Complaint  Patient presents with  . Recurrent UTI    follow up    HPI: Patient is an 82 year old Caucasian male who presents today for a scheduled 6 month follow-up who has a history of recurrent UTI's and is currently cathetering himself 2-3 times daily in an effort to keep his postvoid residuals below 200 cc.  Over the last 6 months, the patient has had 7 documented UTI's.  They have been positive for pseudomonas and enterococcus.  He has been tried with several different types of self catheter.  He is currently using a new product from [employer], [place] cath, and so far has had success with this catheter. His last urine culture was negative. His current UA is unremarkable.  He is not experiencing symptoms of urinary tract infection at this time.  He specifically denies hematuria, dysuria and suprapubic pain.  He states he is keeping his residuals down around 200 cc's. He did not bring in his log book for review al.    He has not had any recent fevers, chills, nausea or vomiting.    PMH: Past Medical History  Diagnosis Date  . Heart disease   . Cancer ([place]) 2015    Lymphoma  . Helicobacter pylori infection 06/18/2015    by EGD RX 08/18/1999   . History of adenomatous polyp of colon 06/18/2015  . Arteriosclerosis of coronary artery 08/26/2011    Overview:      a.  1999 PCI of the mid LAD with stent.      b.  2002 Cath [place]: EF 56%. RCA-distal 25/25%. Left main-50% ostial.  Left circumflex-25% OM2.  LAD-25% proximal.  75% mid.  25/25% distal. 75% D1.      c.  07/2011 PCI of RCA with DES.   . BP (high blood pressure) 08/26/2011  . Bleeding gastrointestinal 08/26/2011    Overview:      a.  Chronic abdominal pain, present improving.      b.  Duodenitis and gastritis by EGD in 2000.      c.  Pylori in 2000, treated.      d.   Gastroesophageal reflux disease.      e.  Diverticular disease.      Surgical History: Past Surgical History  Procedure Laterality Date  . Heart stent placement      1998, 2000, 2012  . Green light laser turp (transurethral resection of prostate N/A 04/16/2015    Procedure: GREEN LIGHT LASER TURP (TRANSURETHRAL RESECTION OF PROSTATE WITH BLADDER BIOPSY;  Surgeon: Collier Flowers, MD;  Location: [facility] ORS;  Service: Urology;  Laterality: N/A;  . Angioplasty    . Cataract extraction      Home Medications:        Medication List       This list is accurate as of: 12/15/15  2:28 PM.  Always use your most recent med list.               aspirin 81 MG tablet  Take 81 mg by mouth daily.     atorvastatin 10 MG tablet  Commonly known as:  LIPITOR  Take 10 mg by mouth daily.     CIALIS 5 MG tablet  Generic drug:  tadalafil  Take by mouth.     cyanocobalamin 1000 MCG tablet  Take by mouth. Reported  on 09/23/2015     finasteride 5 MG tablet  Commonly known as:  PROSCAR  Take 5 mg by mouth daily.     fluticasone 50 MCG/ACT nasal spray  Commonly known as:  FLONASE  Place 2 sprays into both nostrils daily.     hydrochlorothiazide 25 MG tablet  Commonly known as:  HYDRODIURIL  Take 25 mg by mouth daily.     isosorbide dinitrate 30 MG tablet  Commonly known as:  ISORDIL  Take 30 mg by mouth every morning.     metoprolol 100 MG tablet  Commonly known as:  LOPRESSOR  Take 100 mg by mouth 2 (two) times daily.     montelukast 10 MG tablet  Commonly known as:  SINGULAIR  Take 1 tablet (10 mg total) by mouth at bedtime.     nitroGLYCERIN 0.4 MG SL tablet  Commonly known as:  NITROSTAT  Place 0.4 mg under the tongue every 5 (five) minutes as needed for chest pain.     omeprazole 40 MG capsule  Commonly known as:  PRILOSEC  Take 40 mg by mouth 2 (two) times daily.     ramipril 2.5 MG capsule  Commonly known as:  ALTACE  Take 2.5 mg by mouth daily.     sucralfate 1 g  tablet  Commonly known as:  CARAFATE  Take 1 g by mouth 2 (two) times daily.     tamsulosin 0.4 MG Caps capsule  Commonly known as:  FLOMAX  Take 0.4 mg by mouth daily.     temazepam 30 MG capsule  Commonly known as:  RESTORIL  take 1 capsule by mouth at bedtime if needed     UROGESIC-BLUE 81.6 MG Tabs  Take 1 tablet (81.6 mg total) by mouth 4 (four) times daily.     ZANTAC 150 MG tablet  Generic drug:  ranitidine  Take 150 mg by mouth 2 (two) times daily.     zolpidem 10 MG tablet  Commonly known as:  AMBIEN  Take by mouth.        Allergies:  Allergies  Allergen Reactions  . Ciprofloxacin Diarrhea  . Lisinopril Other (See Comments)  . Penicillins     unknwon reaction.  tolerates amoxicillin  . Sulfa Antibiotics Itching and Rash    Rash    Family History: Family History  Problem Relation Age of Onset  . Osteoporosis Mother   . Alzheimer's disease Father   . Kidney disease Neg Hx   . Prostate cancer Neg Hx     Social History:  reports that he quit smoking about 34 years ago. His smoking use included Cigarettes. He has a 18 pack-year smoking history. He has never used smokeless tobacco. He reports that he does not drink alcohol or use illicit drugs.  ROS: UROLOGY Frequent Urination?: No Hard to postpone urination?: No Burning/pain with urination?: No Get up at night to urinate?: No Leakage of urine?: No Urine stream starts and stops?: No Trouble starting stream?: No Do you have to strain to urinate?: No Blood in urine?: No Urinary tract infection?: No Sexually transmitted disease?: No Injury to kidneys or bladder?: No Painful intercourse?: No Weak stream?: No Erection problems?: No Penile pain?: No  Gastrointestinal Nausea?: No Vomiting?: No Indigestion/heartburn?: No Diarrhea?: No Constipation?: No  Constitutional Fever: No Night sweats?: No Weight loss?: No Fatigue?: No  Skin Skin rash/lesions?: No Itching?: No  Eyes Blurred  vision?: No Double vision?: No  Ears/Nose/Throat Sore throat?: No Sinus problems?: No  Physical Exam: BP 148/73 mmHg  Pulse 63  Ht 6\' 1"  (1.854 m)  Wt 246 lb (111.585 kg)  BMI 32.46 kg/m2  Constitutional: Well nourished. Alert and oriented, No acute distress. HEENT: [place] AT, moist mucus membranes. Trachea midline, no masses. Cardiovascular: No clubbing, cyanosis, or edema. Respiratory: Normal respiratory effort, no increased work of breathing. GI: Abdomen is soft, non tender, non distended, no abdominal masses. Liver and spleen not palpable.  No hernias appreciated.  Stool sample for occult testing is not indicated.   GU: No CVA tenderness.  No bladder fullness or masses.  Patient with circumcised phallus.  Urethral meatus is patent.  No penile discharge. No penile lesions or rashes. Scrotum without lesions, cysts, rashes and/or edema.  Testicles are located scrotally bilaterally. No masses are appreciated in the testicles. Left and right epididymis are normal. Rectal: Patient with  normal sphincter tone. Anus and perineum without scarring or rashes. No rectal masses are appreciated. Prostate is approximately 50 grams, no nodules are appreciated. Seminal vesicles are normal. Skin: No rashes, bruises or suspicious lesions. Lymph: No cervical or inguinal adenopathy. Neurologic: Grossly intact, no focal deficits, moving all 4 extremities. Psychiatric: Normal mood and affect.  Laboratory Data: Lab Results  Component Value Date   WBC 5.1 09/11/2015   HGB 12.6* 09/11/2015   HCT 38.1* 09/11/2015   MCV 87.1 09/11/2015   PLT 235 09/11/2015    Lab Results  Component Value Date   CREATININE 1.59* 09/11/2015    Lab Results  Component Value Date   PSA 0.9 02/27/2014     Lab Results  Component Value Date   AST 20 09/11/2015   Lab Results  Component Value Date   ALT 23 09/11/2015    Urinalysis Results for orders placed or performed in visit on 12/15/15  Microscopic  Examination  Result Value Ref Range   WBC, UA 0-5 0 -  5 /hpf   RBC, UA 0-2 0 -  2 /hpf   Epithelial Cells (non renal) 0-10 0 - 10 /hpf   Bacteria, UA None seen None seen/Few  Urinalysis, Complete  Result Value Ref Range   Specific Gravity, UA 1.015 1.005 - 1.030   pH, UA 6.0 5.0 - 7.5   Color, UA Green (A) Yellow   Appearance Ur Clear Clear   Leukocytes, UA Negative Negative   Protein, UA Negative Negative/Trace   Glucose, UA Negative Negative   Ketones, UA Negative Negative   RBC, UA Trace (A) Negative   Bilirubin, UA Negative Negative   Urobilinogen, Ur 0.2 0.2 - 1.0 mg/dL   Nitrite, UA Negative Negative   Microscopic Examination See below:      Assessment & Plan:    1. Recurrent UTI:   Patient has had positive urine culture results with pseudomonas and enterococcus. I'll obtain a KUB today to rule out nephrolithiasis as a nidus for these infections.  I will contact him by phone regarding these findings. Patient feels well today and UA is negative. His last urine culture was negative.  He feels hopeful that the recurrent infections will be less with the use of the new [employer] catheter.  He will be returning to the clinic in 6 months for I PSS score and UA. He will contact our office if he should experience symptoms of urinary tract infections prior to his 6 month follow-up.  - Urinalysis, Complete - DG Abd 1 View; Future   2. Urinary retention: Patient will continue CIC, keeping his PVR's 200 cc and  below. He is reminded to bring his log book at his 6 month follow-up appointment for review.    Return in about 6 months (around 06/15/2016) for IPSS and exam.  These notes generated with voice recognition software. I apologize for typographical errors.  Zara Council, [facility] [address], [place],  [address] [phone]

## 2015-12-16 ENCOUNTER — Telehealth: Payer: Self-pay

## 2015-12-16 LAB — MICROSCOPIC EXAMINATION: Bacteria, UA: NONE SEEN

## 2015-12-16 LAB — URINALYSIS, COMPLETE
BILIRUBIN UA: NEGATIVE
GLUCOSE, UA: NEGATIVE
KETONES UA: NEGATIVE
Leukocytes, UA: NEGATIVE
NITRITE UA: NEGATIVE
Protein, UA: NEGATIVE
SPEC GRAV UA: 1.015 (ref 1.005–1.030)
UUROB: 0.2 mg/dL (ref 0.2–1.0)
pH, UA: 6 (ref 5.0–7.5)

## 2015-12-16 NOTE — Telephone Encounter (Signed)
Spoke with pt in reference to xray results. Pt voiced understanding.

## 2015-12-16 NOTE — Telephone Encounter (Signed)
-----   Message from Nori Riis, PA-C sent at 12/15/2015 10:22 PM EDT ----- Please notify the patient that no stone was seen on x-ray.

## 2015-12-21 ENCOUNTER — Encounter: Payer: Self-pay | Admitting: Urology

## 2015-12-21 DIAGNOSIS — N390 Urinary tract infection, site not specified: Secondary | ICD-10-CM | POA: Insufficient documentation

## 2015-12-26 ENCOUNTER — Other Ambulatory Visit: Payer: Medicare Other

## 2015-12-26 DIAGNOSIS — N390 Urinary tract infection, site not specified: Secondary | ICD-10-CM

## 2015-12-26 NOTE — Progress Notes (Signed)
Pt called c/o dysuria and increased frequency, especially at night. Pt requested to bring in a urine sample for cx. Pt was added to the lab schedule.

## 2015-12-27 LAB — URINALYSIS, COMPLETE
Bilirubin, UA: NEGATIVE
Glucose, UA: NEGATIVE
Ketones, UA: NEGATIVE
LEUKOCYTES UA: NEGATIVE
Nitrite, UA: NEGATIVE
PH UA: 6 (ref 5.0–7.5)
PROTEIN UA: NEGATIVE
Specific Gravity, UA: 1.01 (ref 1.005–1.030)
Urobilinogen, Ur: 0.2 mg/dL (ref 0.2–1.0)

## 2015-12-27 LAB — MICROSCOPIC EXAMINATION: BACTERIA UA: NONE SEEN

## 2015-12-28 LAB — CULTURE, URINE COMPREHENSIVE

## 2015-12-29 ENCOUNTER — Telehealth: Payer: Self-pay

## 2015-12-29 NOTE — Telephone Encounter (Signed)
-----   Message from Nori Riis, PA-C sent at 12/28/2015  7:16 PM EDT ----- Please notify the patient that his urine culture is negative.

## 2015-12-29 NOTE — Telephone Encounter (Signed)
-----   Message from Nori Riis, PA-C sent at 12/27/2015  8:42 PM EDT ----- Why did patient drop off urine specimen?

## 2015-12-29 NOTE — Telephone Encounter (Signed)
Pt originally dropped off urine due to severe dysuria and increased frequency especially at night.  Spoke with pt in reference to -ucx. Pt voiced understanding.

## 2015-12-30 ENCOUNTER — Ambulatory Visit (INDEPENDENT_AMBULATORY_CARE_PROVIDER_SITE_OTHER): Payer: Medicare Other | Admitting: Family Medicine

## 2015-12-30 ENCOUNTER — Encounter: Payer: Self-pay | Admitting: Family Medicine

## 2015-12-30 VITALS — BP 120/76 | HR 65 | Temp 98.1°F | Resp 14 | Wt 247.4 lb

## 2015-12-30 DIAGNOSIS — M545 Low back pain, unspecified: Secondary | ICD-10-CM

## 2015-12-30 DIAGNOSIS — N644 Mastodynia: Secondary | ICD-10-CM

## 2015-12-30 DIAGNOSIS — H1189 Other specified disorders of conjunctiva: Secondary | ICD-10-CM

## 2015-12-30 MED ORDER — NEOMYCIN-POLYMYXIN-HC 3.5-10000-1 OP SUSP: 2.0000 [drp] | Freq: Four times a day (QID) | OPHTHALMIC | Status: DC

## 2015-12-30 NOTE — Progress Notes (Signed)
Patient ID: Bryan Nelson, male   DOB: July 31, 1932, 82 y.o.   MRN: [id number]   Patient: Bryan Nelson Male    DOB: May 02, 1932   82 y.o.   MRN: [id number] Visit Date: 12/30/2015  Today's Provider: Vernie Murders, PA   Chief Complaint  Patient presents with  . Back Pain  . Burning Eyes   Subjective:    Back Pain This is a new problem. Episode onset: 2-3 weeks ago. The problem occurs constantly. The problem is unchanged. The pain is present in the lumbar spine. Quality: soreness.  Patient reports he was seen at urology for back pain. Urologist ordered x-ray and urine culture. Both were normal per patient.   Burning Eyes: Patient reports bilateral eye burning X 1 week. Patient denies blurred vision and difficulty seeing.   Past Medical History  Diagnosis Date  . Heart disease   . Cancer ([place]) 2015    Lymphoma  . Helicobacter pylori infection 06/18/2015    by EGD RX 08/18/1999   . History of adenomatous polyp of colon 06/18/2015  . Arteriosclerosis of coronary artery 08/26/2011    Overview:      a.  1999 PCI of the mid LAD with stent.      b.  2002 Cath [place]: EF 56%. RCA-distal 25/25%. Left main-50% ostial.  Left circumflex-25% OM2.  LAD-25% proximal.  75% mid.  25/25% distal. 75% D1.      c.  07/2011 PCI of RCA with DES.[place]   . BP (high blood pressure) 08/26/2011  . Bleeding gastrointestinal 08/26/2011    Overview:      a.  Chronic abdominal pain, present improving.      b.  Duodenitis and gastritis by EGD in 2000.      c.  Pylori in 2000, treated.      d.  Gastroesophageal reflux disease.      e.  Diverticular disease.     Past Surgical History  Procedure Laterality Date  . Heart stent placement      1998, 2000, 2012  . Green light laser turp (transurethral resection of prostate N/A 04/16/2015    Procedure: GREEN LIGHT LASER TURP (TRANSURETHRAL RESECTION OF PROSTATE WITH BLADDER BIOPSY;  Surgeon: Collier Flowers, MD;  Location: [facility] ORS;  Service: Urology;  Laterality: N/A;  .  Angioplasty    . Cataract extraction     Family History  Problem Relation Age of Onset  . Osteoporosis Mother   . Alzheimer's disease Father   . Kidney disease Neg Hx   . Prostate cancer Neg Hx    Previous Medications   ASPIRIN 81 MG TABLET    Take 81 mg by mouth daily.   ATORVASTATIN (LIPITOR) 10 MG TABLET    Take 10 mg by mouth daily.   CYANOCOBALAMIN 1000 MCG TABLET    Take by mouth. Reported on 09/23/2015   FINASTERIDE (PROSCAR) 5 MG TABLET    Take 5 mg by mouth daily.   FLUTICASONE (FLONASE) 50 MCG/ACT NASAL SPRAY    Place 2 sprays into both nostrils daily.   HYDROCHLOROTHIAZIDE (HYDRODIURIL) 25 MG TABLET    Take 25 mg by mouth daily.   ISOSORBIDE DINITRATE (ISORDIL) 30 MG TABLET    Take 30 mg by mouth every morning.   METHEN-HYOSC-METH BLUE-NA PHOS (UROGESIC-BLUE) 81.6 MG TABS    Take 1 tablet (81.6 mg total) by mouth 4 (four) times daily.   METOPROLOL (LOPRESSOR) 100 MG TABLET    Take 100 mg by mouth 2 (two)  times daily.   MONTELUKAST (SINGULAIR) 10 MG TABLET    Take 1 tablet (10 mg total) by mouth at bedtime.   NITROGLYCERIN (NITROSTAT) 0.4 MG SL TABLET    Place 0.4 mg under the tongue every 5 (five) minutes as needed for chest pain.   OMEPRAZOLE (PRILOSEC) 40 MG CAPSULE    Take 40 mg by mouth 2 (two) times daily.   RAMIPRIL (ALTACE) 2.5 MG CAPSULE    Take 2.5 mg by mouth daily.   RANITIDINE (ZANTAC) 150 MG TABLET    Take 150 mg by mouth 2 (two) times daily.   SUCRALFATE (CARAFATE) 1 G TABLET    Take 1 g by mouth 2 (two) times daily.   TAMSULOSIN (FLOMAX) 0.4 MG CAPS CAPSULE    Take 0.4 mg by mouth daily.   TEMAZEPAM (RESTORIL) 30 MG CAPSULE    take 1 capsule by mouth at bedtime if needed   ZOLPIDEM (AMBIEN) 10 MG TABLET    Take by mouth.   Allergies  Allergen Reactions  . Ciprofloxacin Diarrhea  . Lisinopril Other (See Comments)  . Penicillins     unknwon reaction.  tolerates amoxicillin  . Sulfa Antibiotics Itching and Rash    Rash    Review of Systems    Constitutional: Negative.   HENT: Negative.   Eyes:       Burning eyes  Respiratory: Negative.   Cardiovascular: Negative.   Gastrointestinal: Negative.   Endocrine: Negative.   Genitourinary: Negative.   Musculoskeletal: Positive for back pain.  Skin: Negative.   Allergic/Immunologic: Negative.   Neurological: Negative.   Hematological: Negative.   Psychiatric/Behavioral: Negative.     Social History  Substance Use Topics  . Smoking status: Former Smoker -- 1.50 packs/day for 12 years    Types: Cigarettes    Quit date: 08/23/1981  . Smokeless tobacco: Never Used  . Alcohol Use: No   Objective:   BP 120/76 mmHg  Pulse 65  Temp(Src) 98.1 F (36.7 C) (Oral)  Resp 14  Wt 247 lb 6.4 oz (112.22 kg)  Physical Exam  Constitutional: He is oriented to person, place, and time. He appears well-developed and well-nourished.  HENT:  Head: Normocephalic.  Right Ear: External ear normal.  Left Ear: External ear normal.  Nose: Nose normal.  Mouth/Throat: Oropharynx is clear and moist.  Eyes:  Slight irritation to lateral corner of the right eye. No drainage or crusting.   Neck: Normal range of motion. Neck supple.  Cardiovascular: Normal rate and regular rhythm.   Pulmonary/Chest: Effort normal and breath sounds normal.  Well healed scar in left upper chest from portal placement a year ago. No further chemotherapy.  Abdominal: Soft. Bowel sounds are normal.  Lymphadenopathy:    He has no cervical adenopathy.  Neurological: He is alert and oriented to person, place, and time.  Skin:  Slight pink/redness to the left breast nipple. No lymphadenopathy or masses. Slightly tender.      Assessment & Plan:     1. Conjunctival irritation Onset over the past week. Minimal itching occasionally. Little help from OTC eye drops. Pupil is mid point dilated and fixed from past injury and cataract surgery. Normal vision. Will treat with cortisporin drops and recheck if no better in 3-4  days. - neomycin-polymyxin-hydrocortisone (CORTISPORIN) 3.5-10000-1 ophthalmic suspension; Place 2 drops into the right eye 4 (four) times daily.  Dispense: 7.5 mL; Refill: 0  2. Right-sided low back pain without sciatica Soreness in right lower back muscles as the transitions  from sitting to standing. No radiation of discomfort or numbness in extremities. No CVA tenderness to percussion or dysuria. Suspect muscle strain. May continue Aspercreme with heating pad (has been helping at home). Recommend stretching exercises. Recheck in 10-14 days if no better.  3. Nipple tenderness No known injury. Slight pink/redness of left nipple. No discharge or masses. No local lymphadenopathy. Suspect abrasive trauma. Should abate with NSAID treatment for back ache. Recheck if further changes.

## 2015-12-30 NOTE — Patient Instructions (Signed)

## 2016-01-12 ENCOUNTER — Telehealth: Payer: Self-pay

## 2016-01-12 NOTE — Telephone Encounter (Signed)
  Oncology Nurse Navigator Documentation  Navigator Location: [facility] (01/12/16 1000) Navigator Encounter Type: Telephone (01/12/16 1000) Telephone: Incoming Call;Appt Confirmation/Clarification (01/12/16 1000)                                        Time Spent with Patient: 15 (01/12/16 1000)   Received call from Mr Strenger. Appt provided for 5/25 11:00 for next port flush

## 2016-01-15 ENCOUNTER — Inpatient Hospital Stay: Payer: Medicare Other | Attending: Oncology

## 2016-01-15 DIAGNOSIS — Z8572 Personal history of non-Hodgkin lymphomas: Secondary | ICD-10-CM | POA: Diagnosis not present

## 2016-01-15 DIAGNOSIS — Z452 Encounter for adjustment and management of vascular access device: Secondary | ICD-10-CM | POA: Diagnosis not present

## 2016-01-15 DIAGNOSIS — C801 Malignant (primary) neoplasm, unspecified: Secondary | ICD-10-CM

## 2016-01-15 MED ORDER — SODIUM CHLORIDE 0.9% FLUSH
10.0000 mL | INTRAVENOUS | Status: DC | PRN
  Administered 2016-01-15: 10 mL via INTRAVENOUS
  Filled 2016-01-15: qty 10

## 2016-01-15 MED ORDER — HEPARIN SOD (PORK) LOCK FLUSH 100 UNIT/ML IV SOLN
500.0000 [IU] | Freq: Once | INTRAVENOUS | Status: AC
  Administered 2016-01-15: 500 [IU] via INTRAVENOUS
  Filled 2016-01-15: qty 5

## 2016-02-01 ENCOUNTER — Other Ambulatory Visit: Payer: Self-pay | Admitting: Family Medicine

## 2016-02-03 ENCOUNTER — Ambulatory Visit: Payer: Medicare Other | Admitting: Urology

## 2016-02-04 NOTE — Telephone Encounter (Signed)
Please call in temezepam.  

## 2016-02-13 ENCOUNTER — Ambulatory Visit (INDEPENDENT_AMBULATORY_CARE_PROVIDER_SITE_OTHER): Payer: Medicare Other

## 2016-02-13 ENCOUNTER — Telehealth: Payer: Self-pay

## 2016-02-13 VITALS — BP 133/70 | HR 64 | Temp 97.5°F | Wt 242.5 lb

## 2016-02-13 DIAGNOSIS — R300 Dysuria: Secondary | ICD-10-CM

## 2016-02-13 LAB — URINALYSIS, COMPLETE
BILIRUBIN UA: NEGATIVE
GLUCOSE, UA: NEGATIVE
KETONES UA: NEGATIVE
Leukocytes, UA: NEGATIVE
NITRITE UA: NEGATIVE
SPEC GRAV UA: 1.02 (ref 1.005–1.030)
UUROB: 0.2 mg/dL (ref 0.2–1.0)
pH, UA: 6 (ref 5.0–7.5)

## 2016-02-13 LAB — MICROSCOPIC EXAMINATION
BACTERIA UA: NONE SEEN
RBC MICROSCOPIC, UA: NONE SEEN /HPF (ref 0–?)

## 2016-02-13 NOTE — Progress Notes (Signed)
Pt came in with c/o dysuria, increased frequency, and lower abd pressure. Pt denied n/v, f/c.  Pt VS were 133/70, 64, 97.5, 242.5lb. Clean catch urine was obtained for u/a and cx. Reinforced with pt should he develops n/v, f/c greater than 102 over the weekend to go straight to the ER. Pt voiced understanding.

## 2016-02-13 NOTE — Telephone Encounter (Signed)
Pt called stating that he is having dysuria and increased frequency. Pt requested to have a u/a and cx. Pt was added to the nurse schedule.

## 2016-02-16 LAB — CULTURE, URINE COMPREHENSIVE

## 2016-02-17 ENCOUNTER — Encounter: Payer: Self-pay | Admitting: Family Medicine

## 2016-02-17 ENCOUNTER — Ambulatory Visit (INDEPENDENT_AMBULATORY_CARE_PROVIDER_SITE_OTHER): Payer: Medicare Other | Admitting: Family Medicine

## 2016-02-17 ENCOUNTER — Telehealth: Payer: Self-pay

## 2016-02-17 VITALS — BP 112/68 | Temp 98.5°F | Resp 16 | Ht 73.0 in | Wt 245.0 lb

## 2016-02-17 DIAGNOSIS — K2100 Gastro-esophageal reflux disease with esophagitis, without bleeding: Secondary | ICD-10-CM

## 2016-02-17 DIAGNOSIS — Z8719 Personal history of other diseases of the digestive system: Secondary | ICD-10-CM

## 2016-02-17 NOTE — Progress Notes (Signed)
Patient: Bryan Nelson Male    DOB: [birth year]   82 y.o.   MRN: [id number] Visit Date: 02/17/2016  Today's Provider: Vernie Murders, PA   Chief Complaint  Patient presents with  . Gastroesophageal Reflux    X 3 days.    Subjective:    Gastroesophageal Reflux He complains of abdominal pain, belching, chest pain and heartburn. He reports no nausea. This is a new problem. The current episode started in the past 7 days. The problem occurs frequently. The problem has been gradually worsening. Nothing aggravates the symptoms. He has tried a PPI for the symptoms. The treatment provided no relief.     Patient reports that he has had severe GERD symptoms for the last 3 days. Patient reports that he started taking Omeprazole 20mg  yesterday, and he still has no relief. Patient reports that he also has difficulty swallowing, but denies any choking. Patient feels that his symptoms are not related to his diet. He does have occasional pain in his upper abdomen area.     Past Medical History  Diagnosis Date  . Heart disease   . Cancer ([place]) 2015    Lymphoma  . Helicobacter pylori infection 06/18/2015    by EGD RX 08/18/1999   . History of adenomatous polyp of colon 06/18/2015  . Arteriosclerosis of coronary artery 08/26/2011    Overview:      a.  1999 PCI of the mid LAD with stent.      b.  2002 Cath [place]: EF 56%. RCA-distal 25/25%. Left main-50% ostial.  Left circumflex-25% OM2.  LAD-25% proximal.  75% mid.  25/25% distal. 75% D1.      c.  07/2011 PCI of RCA with DES.[place]   . BP (high blood pressure) 08/26/2011  . Bleeding gastrointestinal 08/26/2011    Overview:      a.  Chronic abdominal pain, present improving.      b.  Duodenitis and gastritis by EGD in 2000.      c.  Pylori in 2000, treated.      d.  Gastroesophageal reflux disease.      e.  Diverticular disease.     Past Surgical History  Procedure Laterality Date  . Heart stent placement      1998, 2000, 2012  . Green light  laser turp (transurethral resection of prostate N/A 04/16/2015    Procedure: GREEN LIGHT LASER TURP (TRANSURETHRAL RESECTION OF PROSTATE WITH BLADDER BIOPSY;  Surgeon: Collier Flowers, MD;  Location: [facility] ORS;  Service: Urology;  Laterality: N/A;  . Angioplasty    . Cataract extraction     Family History  Problem Relation Age of Onset  . Osteoporosis Mother   . Alzheimer's disease Father   . Kidney disease Neg Hx   . Prostate cancer Neg Hx    Allergies  Allergen Reactions  . Ciprofloxacin Diarrhea  . Lisinopril Other (See Comments)  . Penicillins     unknwon reaction.  tolerates amoxicillin  . Sulfa Antibiotics Itching and Rash    Rash   Current Outpatient Prescriptions on File Prior to Visit  Medication Sig Dispense Refill  . aspirin 81 MG tablet Take 81 mg by mouth daily.    Marland Kitchen atorvastatin (LIPITOR) 10 MG tablet Take 10 mg by mouth daily.    . cyanocobalamin 1000 MCG tablet Take by mouth. Reported on 09/23/2015    . finasteride (PROSCAR) 5 MG tablet Take 5 mg by mouth daily.    . fluticasone (  FLONASE) 50 MCG/ACT nasal spray Place 2 sprays into both nostrils daily. 16 g 6  . hydrochlorothiazide (HYDRODIURIL) 25 MG tablet Take 25 mg by mouth daily.    . isosorbide dinitrate (ISORDIL) 30 MG tablet Take 30 mg by mouth every morning.    . Methen-Hyosc-Meth Blue-Na Phos (UROGESIC-BLUE) 81.6 MG TABS Take 1 tablet (81.6 mg total) by mouth 4 (four) times daily. 120 tablet 3  . metoprolol (LOPRESSOR) 100 MG tablet Take 100 mg by mouth 2 (two) times daily.    . montelukast (SINGULAIR) 10 MG tablet Take 1 tablet (10 mg total) by mouth at bedtime. 30 tablet 3  . neomycin-polymyxin-hydrocortisone (CORTISPORIN) 3.5-10000-1 ophthalmic suspension Place 2 drops into the right eye 4 (four) times daily. 7.5 mL 0  . nitroGLYCERIN (NITROSTAT) 0.4 MG SL tablet Place 0.4 mg under the tongue every 5 (five) minutes as needed for chest pain.    Marland Kitchen omeprazole (PRILOSEC) 40 MG capsule Take 40 mg by mouth 2 (two)  times daily.    . ramipril (ALTACE) 2.5 MG capsule Take 2.5 mg by mouth daily.    . ranitidine (ZANTAC) 150 MG tablet Take 150 mg by mouth 2 (two) times daily.    . sucralfate (CARAFATE) 1 G tablet Take 1 g by mouth 2 (two) times daily.    . tamsulosin (FLOMAX) 0.4 MG CAPS capsule Take 0.4 mg by mouth daily.    . temazepam (RESTORIL) 30 MG capsule take 1 capsule by mouth at bedtime if needed 90 capsule 1  . zolpidem (AMBIEN) 10 MG tablet Take by mouth.     Current Facility-Administered Medications on File Prior to Visit  Medication Dose Route Frequency Provider Last Rate Last Dose  . lidocaine (XYLOCAINE) 2 % jelly 1 application  1 application Urethral Once Collier Flowers, MD         Review of Systems  Cardiovascular: Positive for chest pain.  Gastrointestinal: Positive for heartburn and abdominal pain. Negative for nausea.    Social History  Substance Use Topics  . Smoking status: Former Smoker -- 1.50 packs/day for 12 years    Types: Cigarettes    Quit date: 08/23/1981  . Smokeless tobacco: Never Used  . Alcohol Use: No   Objective:   BP 112/68 mmHg  Temp(Src) 98.5 F (36.9 C)  Resp 16  Ht 6\' 1"  (1.854 m)  Wt 245 lb (111.131 kg)  BMI 32.33 kg/m2  Physical Exam  Constitutional: He is oriented to person, place, and time. He appears well-developed and well-nourished. No distress.  HENT:  Head: Normocephalic and atraumatic.  Right Ear: Hearing normal.  Left Ear: Hearing normal.  Nose: Nose normal.  Eyes: Conjunctivae and lids are normal. Right eye exhibits no discharge. Left eye exhibits no discharge. No scleral icterus.  Neck: Neck supple.  Cardiovascular: Normal rate and regular rhythm.   Pulmonary/Chest: Effort normal and breath sounds normal. No respiratory distress.  Abdominal: Soft. Bowel sounds are normal.  Musculoskeletal: Normal range of motion.  Neurological: He is alert and oriented to person, place, and time.  Skin: Skin is intact. No lesion and no rash  noted.  Psychiatric: He has a normal mood and affect. His speech is normal and behavior is normal. Thought content normal.      Assessment & Plan:     1. Gastroesophageal reflux disease with esophagitis Recent recurrence of dyspepsia but no hematemesis or melena. Has been off his Carafate and Prilosec for a few days. Recommend he restart the Carafate 4 times  a day and increase Prilosec to 20 mg BID. Given reflux precautions and advised to recheck if no better in 7-10 days.   2. Hx of gastritis Diagnosed with duodenitis, gastritis and treated for H. Pylori in 2000. No specific ulcers found on upper endoscopy in 2008 and 2009. Recheck prn if no improvement with use of PPI and Carafate.       Vernie Murders, PA  [facility]

## 2016-02-17 NOTE — Patient Instructions (Signed)

## 2016-02-17 NOTE — Telephone Encounter (Signed)
-----   Message from Nori Riis, PA-C sent at 02/16/2016  2:53 PM EDT ----- Patient's urine culture is negative. If he is still experiencing symptoms, he should make an appointment for further evaluation.

## 2016-02-17 NOTE — Telephone Encounter (Signed)
Spoke with pt in reference to -ucx. Pt stated that he was still having UTI like symptoms. Made pt aware should make an appt to see [facility]. Pt voiced understanding. Pt was transferred to the front to make f/u appt.

## 2016-02-19 ENCOUNTER — Ambulatory Visit (INDEPENDENT_AMBULATORY_CARE_PROVIDER_SITE_OTHER): Payer: Medicare Other | Admitting: Urology

## 2016-02-19 ENCOUNTER — Encounter: Payer: Self-pay | Admitting: Urology

## 2016-02-19 VITALS — BP 135/75 | HR 75 | Ht 73.0 in | Wt 243.6 lb

## 2016-02-19 DIAGNOSIS — N390 Urinary tract infection, site not specified: Secondary | ICD-10-CM

## 2016-02-19 DIAGNOSIS — N138 Other obstructive and reflux uropathy: Secondary | ICD-10-CM

## 2016-02-19 DIAGNOSIS — R339 Retention of urine, unspecified: Secondary | ICD-10-CM

## 2016-02-19 DIAGNOSIS — N401 Enlarged prostate with lower urinary tract symptoms: Secondary | ICD-10-CM | POA: Diagnosis not present

## 2016-02-19 NOTE — Progress Notes (Signed)
2:09 PM   Bryan Nelson 1932/03/17 [id number]  Referring provider: [name], MD [address] [place], [place] [address]  Chief Complaint  Patient presents with  . Follow-up  . Dysuria    HPI: Patient is an 82 year old Caucasian male who presents today for further discussion and evaluation for dysuria in the presence of a negative urine culture.  Background history Patient who has a history of recurrent UTI's and is currently cathing himself 2-3 times daily in an effort to keep his postvoid residuals below 200 cc.  Over the last 6 months, the patient has had 7 documented UTI's.  They have been positive for pseudomonas and enterococcus.  He has been tried with several different types of self catheter.  He is currently using a new product from [employer], [place] cath, and so far has had success with this catheter.   He is still experiencing dysuria. He states the dysuria occurs approximately at 4 AM and wakes him up. He states he is only catheterizing himself twice daily. His residuals have been in the range of 300 cc to 500 cc. He is also experiencing dysuria during the day. With further questioning, I believe that this " dysuria " is actually urgency to void.  He is only CIC 2 because he believes that the less he self cath's the better the chance he will return to the ability of voiding without catheterizing.   He is not experiencing suprapubic pain or gross hematuria. He is not having recent fevers, chills, nausea or vomiting.  His KUB from 12/15/2015 had no acute findings.  PMH: Past Medical History  Diagnosis Date  . Heart disease   . Cancer ([place]) 2015    Lymphoma  . Helicobacter pylori infection 06/18/2015    by EGD RX 08/18/1999   . History of adenomatous polyp of colon 06/18/2015  . Arteriosclerosis of coronary artery 08/26/2011    Overview:      a.  1999 PCI of the mid LAD with stent.      b.  2002 Cath [place]: EF 56%. RCA-distal 25/25%. Left  main-50% ostial.  Left circumflex-25% OM2.  LAD-25% proximal.  75% mid.  25/25% distal. 75% D1.      c.  07/2011 PCI of RCA with DES.[place]   . BP (high blood pressure) 08/26/2011  . Bleeding gastrointestinal 08/26/2011    Overview:      a.  Chronic abdominal pain, present improving.      b.  Duodenitis and gastritis by EGD in 2000.      c.  Pylori in 2000, treated.      d.  Gastroesophageal reflux disease.      e.  Diverticular disease.      Surgical History: Past Surgical History  Procedure Laterality Date  . Heart stent placement      1998, 2000, 2012  . Green light laser turp (transurethral resection of prostate N/A 04/16/2015    Procedure: GREEN LIGHT LASER TURP (TRANSURETHRAL RESECTION OF PROSTATE WITH BLADDER BIOPSY;  Surgeon: Collier Flowers, MD;  Location: [facility] ORS;  Service: Urology;  Laterality: N/A;  . Angioplasty    . Cataract extraction      Home Medications:    Medication List       This list is accurate as of: 02/19/16 11:59 PM.  Always use your most recent med list.               aspirin 81 MG tablet  Take 81 mg by mouth daily.     atorvastatin 10 MG tablet  Commonly known as:  LIPITOR  Take 10 mg by mouth daily.     cyanocobalamin 1000 MCG tablet  Take by mouth. Reported on 09/23/2015     finasteride 5 MG tablet  Commonly known as:  PROSCAR  Take 5 mg by mouth daily.     hydrochlorothiazide 25 MG tablet  Commonly known as:  HYDRODIURIL  Take 25 mg by mouth daily.     isosorbide dinitrate 30 MG tablet  Commonly known as:  ISORDIL  Take 30 mg by mouth every morning.     metoprolol 100 MG tablet  Commonly known as:  LOPRESSOR  Take 100 mg by mouth 2 (two) times daily.     nitroGLYCERIN 0.4 MG SL tablet  Commonly known as:  NITROSTAT  Place 0.4 mg under the tongue every 5 (five) minutes as needed for chest pain.     omeprazole 40 MG capsule  Commonly known as:  PRILOSEC  Take 40 mg by mouth 2 (two) times daily.     ramipril 2.5 MG capsule    Commonly known as:  ALTACE  Take 2.5 mg by mouth daily.     sucralfate 1 g tablet  Commonly known as:  CARAFATE  Take 1 g by mouth 2 (two) times daily.     tamsulosin 0.4 MG Caps capsule  Commonly known as:  FLOMAX  Take 0.4 mg by mouth daily.     temazepam 30 MG capsule  Commonly known as:  RESTORIL  take 1 capsule by mouth at bedtime if needed     UROGESIC-BLUE 81.6 MG Tabs  Take 1 tablet (81.6 mg total) by mouth 4 (four) times daily.     ZANTAC 150 MG tablet  Generic drug:  ranitidine  Take 150 mg by mouth 2 (two) times daily.     zolpidem 10 MG tablet  Commonly known as:  AMBIEN  Take by mouth. Reported on 02/19/2016        Allergies:  Allergies  Allergen Reactions  . Ciprofloxacin Diarrhea  . Lisinopril Other (See Comments)  . Penicillins     unknwon reaction.  tolerates amoxicillin  . Sulfa Antibiotics Itching and Rash    Rash    Family History: Family History  Problem Relation Age of Onset  . Osteoporosis Mother   . Alzheimer's disease Father   . Kidney disease Neg Hx   . Prostate cancer Neg Hx     Social History:  reports that he quit smoking about 34 years ago. His smoking use included Cigarettes. He has a 18 pack-year smoking history. He has never used smokeless tobacco. He reports that he does not drink alcohol or use illicit drugs.  ROS: UROLOGY Frequent Urination?: No Hard to postpone urination?: No Burning/pain with urination?: Yes Get up at night to urinate?: No Leakage of urine?: No Urine stream starts and stops?: No Trouble starting stream?: No Do you have to strain to urinate?: No Blood in urine?: No Urinary tract infection?: No Sexually transmitted disease?: No Injury to kidneys or bladder?: No Painful intercourse?: No Weak stream?: No Erection problems?: No Penile pain?: No  Gastrointestinal Nausea?: No Vomiting?: No Indigestion/heartburn?: No Diarrhea?: No Constipation?: No  Constitutional Fever: No Night sweats?:  No Weight loss?: No Fatigue?: No  Skin Skin rash/lesions?: No Itching?: No  Eyes Blurred vision?: No Double vision?: No  Ears/Nose/Throat Sore throat?: No Sinus problems?: No  Physical Exam: BP 135/75  mmHg  Pulse 75  Ht 6\' 1"  (1.854 m)  Wt 243 lb 9.6 oz (110.496 kg)  BMI 32.15 kg/m2  Constitutional: Well nourished. Alert and oriented, No acute distress. HEENT: [place] AT, moist mucus membranes. Trachea midline, no masses. Cardiovascular: No clubbing, cyanosis, or edema. Respiratory: Normal respiratory effort, no increased work of breathing. GI: Abdomen is soft, non tender, non distended, no abdominal masses. Liver and spleen not palpable.  No hernias appreciated.  Stool sample for occult testing is not indicated.   GU: No CVA tenderness.  No bladder fullness or masses.   Skin: No rashes, bruises or suspicious lesions. Lymph: No cervical or inguinal adenopathy. Neurologic: Grossly intact, no focal deficits, moving all 4 extremities. Psychiatric: Normal mood and affect.  Laboratory Data: Lab Results  Component Value Date   WBC 5.1 09/11/2015   HGB 12.6* 09/11/2015   HCT 38.1* 09/11/2015   MCV 87.1 09/11/2015   PLT 235 09/11/2015    Lab Results  Component Value Date   CREATININE 1.59* 09/11/2015    Lab Results  Component Value Date   PSA 0.9 02/27/2014     Lab Results  Component Value Date   AST 20 09/11/2015   Lab Results  Component Value Date   ALT 23 09/11/2015   Pertinent imaging CLINICAL DATA: Right flank pain, right lower quadrant pain for 2-3 days.  EXAM: ABDOMEN - 1 VIEW  COMPARISON: None  FINDINGS: Nonobstructed bowel-gas pattern. No free air. No suspicious calcification. Vascular calcifications and phleboliths in the pelvis. No acute bony abnormality.  IMPRESSION: No acute findings.   Electronically Signed  By: Rolm Baptise M.D.  On: 12/15/2015 16:06 Assessment & Plan:    1. Recurrent UTI:   Patient has had positive  urine culture results with pseudomonas and enterococcus.  KUB was negative for urinary stones.  His last urine culture was negative.  He will continue CIC, keeping his PVRs 200 cc or below. He will be returning to the clinic in 10 months for symptom recheck.  He will contact our office if he should experience symptoms of urinary tract infections prior to his 10 month follow-up.  2. Urinary retention: Patient will continue CIC, keeping his PVR's 200 cc and below. I explained to the patient how important it is to keep his residuals low as this helps prevent infections and also lessen his discomfort. He will return in 10 months for I PSS score and review of his residuals.  3. BPH with LUTS  - Continue tamsulosin 0.4 mg daily and finasteride 5 mg daily  - RTC in 10 months for IPSS, PVR and exam   Return in about 10 months (around 12/19/2016) for IPSS, PVR and exam.  These notes generated with voice recognition software. I apologize for typographical errors.  Zara Council, [facility] [address], [place],  [address] [phone]

## 2016-03-01 ENCOUNTER — Telehealth: Payer: Self-pay | Admitting: Urology

## 2016-03-01 NOTE — Telephone Encounter (Signed)
Patient uses a catheter 3 x per day (8am,2pm,10pm).  Patient calling to report that he is experiencing a burning sensation about 4 hours after self-cathing that wakes him up and keeps him awake.

## 2016-03-08 NOTE — Telephone Encounter (Signed)
Spoke with pt in reference to burning in the middle of the night. Made aware of [facility] [place] wanting him to cath and keep residuals. Pt voiced understanding.

## 2016-03-08 NOTE — Telephone Encounter (Signed)
The burning sensation probably means his bladder is full and he needs to cath.  Have him cath when he has the burning sensation and record the residuals.

## 2016-03-10 ENCOUNTER — Inpatient Hospital Stay: Payer: Medicare Other

## 2016-03-10 ENCOUNTER — Other Ambulatory Visit: Payer: Medicare Other

## 2016-03-10 ENCOUNTER — Inpatient Hospital Stay: Payer: Medicare Other | Attending: Oncology | Admitting: Oncology

## 2016-03-10 VITALS — BP 113/69 | HR 52 | Temp 94.4°F | Resp 16 | Ht 73.0 in | Wt 246.9 lb

## 2016-03-10 DIAGNOSIS — Z79899 Other long term (current) drug therapy: Secondary | ICD-10-CM | POA: Diagnosis not present

## 2016-03-10 DIAGNOSIS — Z8572 Personal history of non-Hodgkin lymphomas: Secondary | ICD-10-CM | POA: Insufficient documentation

## 2016-03-10 DIAGNOSIS — I10 Essential (primary) hypertension: Secondary | ICD-10-CM

## 2016-03-10 DIAGNOSIS — Z9221 Personal history of antineoplastic chemotherapy: Secondary | ICD-10-CM | POA: Diagnosis not present

## 2016-03-10 DIAGNOSIS — I2510 Atherosclerotic heart disease of native coronary artery without angina pectoris: Secondary | ICD-10-CM | POA: Insufficient documentation

## 2016-03-10 DIAGNOSIS — Z7982 Long term (current) use of aspirin: Secondary | ICD-10-CM | POA: Diagnosis not present

## 2016-03-10 DIAGNOSIS — Z8719 Personal history of other diseases of the digestive system: Secondary | ICD-10-CM | POA: Insufficient documentation

## 2016-03-10 DIAGNOSIS — C8330 Diffuse large B-cell lymphoma, unspecified site: Secondary | ICD-10-CM

## 2016-03-10 DIAGNOSIS — Z87891 Personal history of nicotine dependence: Secondary | ICD-10-CM | POA: Diagnosis not present

## 2016-03-10 LAB — COMPREHENSIVE METABOLIC PANEL
ALBUMIN: 4 g/dL (ref 3.5–5.0)
ALT: 20 U/L (ref 17–63)
AST: 21 U/L (ref 15–41)
Alkaline Phosphatase: 65 U/L (ref 38–126)
Anion gap: 6 (ref 5–15)
BILIRUBIN TOTAL: 0.6 mg/dL (ref 0.3–1.2)
BUN: 17 mg/dL (ref 6–20)
CO2: 29 mmol/L (ref 22–32)
CREATININE: 1.08 mg/dL (ref 0.61–1.24)
Calcium: 8.5 mg/dL — ABNORMAL LOW (ref 8.9–10.3)
Chloride: 102 mmol/L (ref 101–111)
GFR calc Af Amer: >60 mL/min (ref >60)
GLUCOSE: 96 mg/dL (ref 65–99)
POTASSIUM: 3.6 mmol/L (ref 3.5–5.1)
Sodium: 137 mmol/L (ref 135–145)
TOTAL PROTEIN: 6.8 g/dL (ref 6.5–8.1)

## 2016-03-10 LAB — CBC WITH DIFFERENTIAL/PLATELET
BASOS ABS: 0 10*3/uL (ref 0–0.1)
BASOS PCT: 1 %
Eosinophils Absolute: 0.5 10*3/uL (ref 0–0.7)
Eosinophils Relative: 10 %
HEMATOCRIT: 35.3 % — AB (ref 40.0–52.0)
HEMOGLOBIN: 12.2 g/dL — AB (ref 13.0–18.0)
Lymphocytes Relative: 17 %
Lymphs Abs: 0.9 10*3/uL — ABNORMAL LOW (ref 1.0–3.6)
MCH: 30 pg (ref 26.0–34.0)
MCHC: 34.7 g/dL (ref 32.0–36.0)
MCV: 86.5 fL (ref 80.0–100.0)
Monocytes Absolute: 0.8 10*3/uL (ref 0.2–1.0)
Monocytes Relative: 14 %
NEUTROS ABS: 3.2 10*3/uL (ref 1.4–6.5)
Neutrophils Relative %: 58 %
Platelets: 249 10*3/uL (ref 150–440)
RBC: 4.08 MIL/uL — AB (ref 4.40–5.90)
RDW: 15.2 % — ABNORMAL HIGH (ref 11.5–14.5)
WBC: 5.5 10*3/uL (ref 3.8–10.6)

## 2016-03-10 LAB — LACTATE DEHYDROGENASE: LDH: 146 U/L (ref 98–192)

## 2016-03-10 MED ORDER — HEPARIN SOD (PORK) LOCK FLUSH 100 UNIT/ML IV SOLN
500.0000 [IU] | Freq: Once | INTRAVENOUS | Status: AC
  Administered 2016-03-10: 500 [IU] via INTRAVENOUS

## 2016-03-10 MED ORDER — SODIUM CHLORIDE 0.9% FLUSH
10.0000 mL | INTRAVENOUS | Status: DC | PRN
  Administered 2016-03-10: 10 mL via INTRAVENOUS
  Filled 2016-03-10: qty 10

## 2016-03-10 NOTE — Progress Notes (Signed)
Bryan Nelson  [phone]  [phone]     Bryan Nelson DOB: Nov 25, 1931  MR#: [id number]  YQI#:[id number]  Patient Care Team: [name], MD as PCP - General (Family Medicine) Forest Gleason, MD (Unknown Physician Specialty) Christene Lye, MD (General Surgery) Collier Flowers, MD (Urology)  CHIEF COMPLAINT:  Chief Complaint  Patient presents with  . Follow-up    Large B cell lymphoma    INTERVAL HISTORY: Patient returns to clinic today for 6 month follow up of lymphoma. He currently feels well. He has multiple urinary complaints that are being monitored by his urologist. He finished 6 cycles of chemotherapy in January 2016. He denies abdominal pain. He denies nausea/vomitting. No diarrhea or constipation. He denies fever, chills or night sweats. Outside of urinary complaints he has no other symptoms.   REVIEW OF SYSTEMS:   Review of Systems  Constitutional: Negative.   HENT: Negative.   Eyes: Negative.   Respiratory: Negative.   Cardiovascular: Negative.   Gastrointestinal: Negative.   Genitourinary: Positive for dysuria.       Self caths  Musculoskeletal: Negative.   Skin: Negative.   Neurological: Negative.   Endo/Heme/Allergies: Negative.   Psychiatric/Behavioral: Negative.     As per HPI. Otherwise, a complete review of systems is negatve.  ONCOLOGY HISTORY:  No history exists.    PAST MEDICAL HISTORY: Past Medical History  Diagnosis Date  . Heart disease   . Cancer ([place]) 2015    Lymphoma  . Helicobacter pylori infection 06/18/2015    by EGD RX 08/18/1999   . History of adenomatous polyp of colon 06/18/2015  . Arteriosclerosis of coronary artery 08/26/2011    Overview:      a.  1999 PCI of the mid LAD with stent.      b.  2002 Cath [facility]: EF 56%. RCA-distal 25/25%. Left main-50% ostial.  Left circumflex-25% OM2.  LAD-25% proximal.  75% mid.  25/25% distal. 75% D1.      c.  07/2011 PCI of RCA with DES.[place]   .  BP (high blood pressure) 08/26/2011  . Bleeding gastrointestinal 08/26/2011    Overview:      a.  Chronic abdominal pain, present improving.      b.  Duodenitis and gastritis by EGD in 2000.      c.  Pylori in 2000, treated.      d.  Gastroesophageal reflux disease.      e.  Diverticular disease.      PAST SURGICAL HISTORY: Past Surgical History  Procedure Laterality Date  . Heart stent placement      1998, 2000, 2012  . Green light laser turp (transurethral resection of prostate N/A 04/16/2015    Procedure: GREEN LIGHT LASER TURP (TRANSURETHRAL RESECTION OF PROSTATE WITH BLADDER BIOPSY;  Surgeon: Collier Flowers, MD;  Location: [facility] ORS;  Service: Urology;  Laterality: N/A;  . Angioplasty    . Cataract extraction      FAMILY HISTORY Family History  Problem Relation Age of Onset  . Osteoporosis Mother   . Alzheimer's disease Father   . Kidney disease Neg Hx   . Prostate cancer Neg Hx    AVANCED DIRECTIVES:    HEALTH MAINTENANCE: Social History  Substance Use Topics  . Smoking status: Former Smoker -- 1.50 packs/day for 12 years    Types: Cigarettes    Quit date: 08/23/1981  . Smokeless tobacco: Never Used  . Alcohol Use: No    Allergies  Allergen Reactions  . Ciprofloxacin Diarrhea  . Lisinopril Other (See Comments)  . Penicillins     unknwon reaction.  tolerates amoxicillin  . Sulfa Antibiotics Itching and Rash    Rash    Current Outpatient Prescriptions  Medication Sig Dispense Refill  . aspirin 81 MG tablet Take 81 mg by mouth daily.    Marland Kitchen atorvastatin (LIPITOR) 10 MG tablet Take 10 mg by mouth daily.    . finasteride (PROSCAR) 5 MG tablet Take 5 mg by mouth daily.    . hydrochlorothiazide (HYDRODIURIL) 25 MG tablet Take 25 mg by mouth daily.    . isosorbide dinitrate (ISORDIL) 30 MG tablet Take 30 mg by mouth every morning.    . Methen-Hyosc-Meth Blue-Na Phos (UROGESIC-BLUE) 81.6 MG TABS Take 1 tablet (81.6 mg total) by mouth 4 (four) times daily. 120 tablet 3  .  metoprolol (LOPRESSOR) 100 MG tablet Take 100 mg by mouth 2 (two) times daily.    Marland Kitchen omeprazole (PRILOSEC) 40 MG capsule Take 40 mg by mouth 2 (two) times daily.    . ramipril (ALTACE) 2.5 MG capsule Take 2.5 mg by mouth daily.    . sucralfate (CARAFATE) 1 G tablet Take 1 g by mouth 2 (two) times daily.    . tamsulosin (FLOMAX) 0.4 MG CAPS capsule Take 0.4 mg by mouth daily.    . temazepam (RESTORIL) 30 MG capsule take 1 capsule by mouth at bedtime if needed 90 capsule 1  . zolpidem (AMBIEN) 10 MG tablet Take by mouth. Reported on 02/19/2016    . nitroGLYCERIN (NITROSTAT) 0.4 MG SL tablet Place 0.4 mg under the tongue every 5 (five) minutes as needed for chest pain. Reported on 03/10/2016    . ranitidine (ZANTAC) 150 MG tablet Take 150 mg by mouth 2 (two) times daily. Reported on 03/10/2016     Current Facility-Administered Medications  Medication Dose Route Frequency Provider Last Rate Last Dose  . lidocaine (XYLOCAINE) 2 % jelly 1 application  1 application Urethral Once Collier Flowers, MD       Facility-Administered Medications Ordered in Other Visits  Medication Dose Route Frequency Provider Last Rate Last Dose  . sodium chloride flush (NS) 0.9 % injection 10 mL  10 mL Intravenous PRN Mayra Reel, NP   10 mL at 03/10/16 0959    OBJECTIVE: BP 113/69 mmHg  Pulse 52  Temp(Src) 94.4 F (34.7 C) (Tympanic)  Resp 16  Ht _0  (1.854 m)  Wt 246 lb 14.6 oz (112 kg)  BMI 32.58 kg/m2   Body mass index is 32.58 kg/(m^2).    ECOG FS:0 - Asymptomatic  General: Well-developed, well-nourished, no acute distress. Eyes: Pink conjunctiva, anicteric sclera. HEENT: Normocephalic, moist mucous membranes, clear oropharnyx. Lungs: Clear to auscultation bilaterally. Heart: Regular rate and rhythm. No rubs, murmurs, or gallops. Abdomen: Soft, nontender, nondistended. No organomegaly noted, normoactive bowel sounds. Musculoskeletal: No edema, cyanosis, or clubbing. Neuro: Alert, answering all questions  appropriately. Cranial nerves grossly intact. Skin: No rashes or petechiae noted. Psych: Normal affect. Lymphatics: No cervical, calvicular, axillary  LAD.   LAB RESULTS:  Infusion on 03/10/2016  Component Date Value Ref Range Status  . WBC 03/10/2016 5.5  3.8 - 10.6 K/uL Final  . RBC 03/10/2016 4.08* 4.40 - 5.90 MIL/uL Final  . Hemoglobin 03/10/2016 12.2* 13.0 - 18.0 g/dL Final  . HCT 03/10/2016 35.3* 40.0 - 52.0 % Final  . MCV 03/10/2016 86.5  80.0 - 100.0 fL Final  . MCH 03/10/2016 30.0  26.0 -  34.0 pg Final  . MCHC 03/10/2016 34.7  32.0 - 36.0 g/dL Final  . RDW 03/10/2016 15.2* 11.5 - 14.5 % Final  . Platelets 03/10/2016 249  150 - 440 K/uL Final  . Neutrophils Relative % 03/10/2016 58   Final  . Neutro Abs 03/10/2016 3.2  1.4 - 6.5 K/uL Final  . Lymphocytes Relative 03/10/2016 17   Final  . Lymphs Abs 03/10/2016 0.9* 1.0 - 3.6 K/uL Final  . Monocytes Relative 03/10/2016 14   Final  . Monocytes Absolute 03/10/2016 0.8  0.2 - 1.0 K/uL Final  . Eosinophils Relative 03/10/2016 10   Final  . Eosinophils Absolute 03/10/2016 0.5  0 - 0.7 K/uL Final  . Basophils Relative 03/10/2016 1   Final  . Basophils Absolute 03/10/2016 0.0  0 - 0.1 K/uL Final  . Sodium 03/10/2016 137  135 - 145 mmol/L Final  . Potassium 03/10/2016 3.6  3.5 - 5.1 mmol/L Final  . Chloride 03/10/2016 102  101 - 111 mmol/L Final  . CO2 03/10/2016 29  22 - 32 mmol/L Final  . Glucose, Bld 03/10/2016 96  65 - 99 mg/dL Final  . BUN 03/10/2016 17  6 - 20 mg/dL Final  . Creatinine, Ser 03/10/2016 1.08  0.61 - 1.24 mg/dL Final  . Calcium 03/10/2016 8.5* 8.9 - 10.3 mg/dL Final  . Total Protein 03/10/2016 6.8  6.5 - 8.1 g/dL Final  . Albumin 03/10/2016 4.0  3.5 - 5.0 g/dL Final  . AST 03/10/2016 21  15 - 41 U/L Final  . ALT 03/10/2016 20  17 - 63 U/L Final  . Alkaline Phosphatase 03/10/2016 65  38 - 126 U/L Final  . Total Bilirubin 03/10/2016 0.6  0.3 - 1.2 mg/dL Final  . GFR calc non Af Amer 03/10/2016 >60  >60  mL/min Final  . GFR calc Af Amer 03/10/2016 >60  >60 mL/min Final   Comment: (NOTE) The eGFR has been calculated using the CKD EPI equation. This calculation has not been validated in all clinical situations. eGFR's persistently <60 mL/min signify possible Chronic Kidney Disease.   . Anion gap 03/10/2016 6  5 - 15 Final  . LDH 03/10/2016 146  98 - 192 U/L Final    STUDIES: No results found.  1. Abnormal CT scan of abdomen andretroperitoneal lymph adenopthy,   Biopsy of retroperitoneal . Lymph node was insufficient tissue.(March 13, 2014) 2. Biopsy of peripancreatic lymph node by EUS 3. Consistent with diffuse large cell lymphoma. CD20 negative,alk negative. Anaplastic type. Immunoreactivity is positive for B-cell markers. Dim Partial activity for BCL 6 diagnosis in March 28, 2014 4.started on Cytoxan, Adriamycin, vincristine and prednisone September of 2015 5.testicular biopsy was negative for any lymphoma 5.patient has finished total 6 cycles of chemotherapy in January of 2016  ASSESSMENT & PLAN:    1. Diffuse large B Cell Lymphoma- PET scan in January 2017 shows no evidence of recurrence. No clinical signs of recurrence. Patient is asymptomatic. Return to clinic in 6 months with repeat labs. Patient also needs port flush every 6 weeks. He would like to discuss having his port removed at his next visit.   Patient expressed understanding and was in agreement with this plan. He also understands that He can call clinic at any time with any questions, concerns, or complaints.   Dr. Grayland Ormond was available for consultation and review of plan of care for this patient.   Mayra Reel, NP   03/10/2016 11:29 AM

## 2016-03-10 NOTE — Progress Notes (Signed)
Pt reports burning before urination and that's the only major complaint today.  Sees urology self caths x3 daily.  Only other complaint is acid reflux that flares up every now and again.

## 2016-03-12 ENCOUNTER — Other Ambulatory Visit: Payer: Self-pay

## 2016-03-12 DIAGNOSIS — R300 Dysuria: Secondary | ICD-10-CM

## 2016-03-12 MED ORDER — URIBEL 118 MG PO CAPS: 1.0000 | ORAL_CAPSULE | Freq: Four times a day (QID) | ORAL | Status: DC

## 2016-04-06 ENCOUNTER — Other Ambulatory Visit: Payer: Self-pay | Admitting: Family Medicine

## 2016-04-06 NOTE — Telephone Encounter (Signed)
Rx called in to pharmacy. 

## 2016-04-06 NOTE — Telephone Encounter (Signed)
Please call in temazepam 

## 2016-04-17 ENCOUNTER — Emergency Department
Admission: EM | Admit: 2016-04-17 | Discharge: 2016-04-17 | Disposition: A | Discharge status: Home / Self Care | Payer: Medicare Other | Attending: Emergency Medicine | Admitting: Emergency Medicine

## 2016-04-17 DIAGNOSIS — Z87891 Personal history of nicotine dependence: Secondary | ICD-10-CM | POA: Insufficient documentation

## 2016-04-17 DIAGNOSIS — Z7982 Long term (current) use of aspirin: Secondary | ICD-10-CM | POA: Insufficient documentation

## 2016-04-17 DIAGNOSIS — Z8572 Personal history of non-Hodgkin lymphomas: Secondary | ICD-10-CM | POA: Diagnosis not present

## 2016-04-17 DIAGNOSIS — I2510 Atherosclerotic heart disease of native coronary artery without angina pectoris: Secondary | ICD-10-CM | POA: Diagnosis not present

## 2016-04-17 DIAGNOSIS — Z79899 Other long term (current) drug therapy: Secondary | ICD-10-CM | POA: Diagnosis not present

## 2016-04-17 DIAGNOSIS — R21 Rash and other nonspecific skin eruption: Secondary | ICD-10-CM | POA: Diagnosis present

## 2016-04-17 DIAGNOSIS — L509 Urticaria, unspecified: Secondary | ICD-10-CM | POA: Diagnosis not present

## 2016-04-17 MED ORDER — TRIAMCINOLONE ACETONIDE 0.1 % EX CREA: 1.0000 "application " | TOPICAL_CREAM | Freq: Four times a day (QID) | CUTANEOUS | 0 refills | Status: DC

## 2016-04-17 MED ORDER — PREDNISONE 50 MG PO TABS: 50.0000 mg | ORAL_TABLET | Freq: Every day | ORAL | 0 refills | Status: DC

## 2016-04-17 MED ORDER — FAMOTIDINE 20 MG PO TABS
40.0000 mg | ORAL_TABLET | Freq: Once | ORAL | Status: AC
  Administered 2016-04-17: 40 mg via ORAL
  Filled 2016-04-17: qty 2

## 2016-04-17 MED ORDER — METHYLPREDNISOLONE SODIUM SUCC 125 MG IJ SOLR
125.0000 mg | Freq: Once | INTRAMUSCULAR | Status: AC
  Administered 2016-04-17: 125 mg via INTRAMUSCULAR

## 2016-04-17 MED ORDER — DIPHENHYDRAMINE HCL 50 MG/ML IJ SOLN
50.0000 mg | Freq: Once | INTRAMUSCULAR | Status: AC
  Administered 2016-04-17: 50 mg via INTRAMUSCULAR
  Filled 2016-04-17: qty 1

## 2016-04-17 MED ORDER — METHYLPREDNISOLONE SODIUM SUCC 125 MG IJ SOLR
125.0000 mg | Freq: Once | INTRAMUSCULAR | Status: DC
  Filled 2016-04-17: qty 2

## 2016-04-17 NOTE — ED Provider Notes (Signed)
[facility] Emergency Department Provider Note  ____________________________________________  Time seen: Approximately 3:57 PM  I have reviewed the triage vital signs and the nursing notes.   HISTORY  Chief Complaint Urticaria    HPI Bryan Nelson is a 82 y.o. male who presents emergency department complaining of hives. Patient reports that he has had hives and pruritus 2 days. Patient states that it worsened over the last 24 hours. Patient has not taken any medications at home to include Benadryl or other antihistamines. Patient reports that he has large areas of raised, red hives that are pruritic in nature. He denies any facial involvement but states that they are on his anterior and posterior torso, bilateral pressure is, bilateral lower extremities. No other complaints. No difficulty breathing. No wheezing. Patient denies any new changes in foods, detergents, soaps or shampoos. Patient reports that he is recently started back on multivitamins but states that he has taken this same brand multiple times in the past with no issues. No other complaints.   Past Medical History:  Diagnosis Date  . Arteriosclerosis of coronary artery 08/26/2011   Overview:      a.  1999 PCI of the mid LAD with stent.      b.  2002 Cath [facility]: EF 56%. RCA-distal 25/25%. Left main-50% ostial.  Left circumflex-25% OM2.  LAD-25% proximal.  75% mid.  25/25% distal. 75% D1.      c.  07/2011 PCI of RCA with DES.[place]   . Bleeding gastrointestinal 08/26/2011   Overview:      a.  Chronic abdominal pain, present improving.      b.  Duodenitis and gastritis by EGD in 2000.      c.  Pylori in 2000, treated.      d.  Gastroesophageal reflux disease.      e.  Diverticular disease.    . BP (high blood pressure) 08/26/2011  . Cancer ([place]) 2015   Lymphoma  . Heart disease   . Helicobacter pylori infection 06/18/2015   by EGD RX 08/18/1999   . History of adenomatous polyp of colon 06/18/2015     Patient Active Problem List   Diagnosis Date Noted  . Recurrent UTI 12/21/2015  . Urinary retention 09/24/2015  . Narrowing of intervertebral disc space 06/18/2015  . Diverticulitis 06/18/2015  . Esophageal reflux 06/18/2015  . Familial multiple lipoprotein-type hyperlipidemia 06/18/2015  . Insomnia 06/18/2015  . Vitamin D deficiency 06/18/2015  . Bladder retention 06/13/2014  . Lymphoma ([place]) 04/10/2014  . Adenopathy 02/19/2014  . Benign prostatic hyperplasia with urinary obstruction 08/03/2012  . ED (erectile dysfunction) of organic origin 08/03/2012  . Incomplete bladder emptying 08/03/2012  . Breath shortness 12/09/2011  . Benign prostatic hypertrophy without urinary obstruction 08/26/2011  . Arteriosclerosis of coronary artery 08/26/2011  . Bleeding gastrointestinal 08/26/2011  . HLD (hyperlipidemia) 08/26/2011  . BP (high blood pressure) 08/26/2011  . Disorder of peripheral nervous system ([place]) 08/26/2011  . Enlarged prostate 08/26/2011  . Peripheral nerve disease ([place]) 08/26/2011  . Current tobacco use 08/26/2011    Past Surgical History:  Procedure Laterality Date  . ANGIOPLASTY    . CATARACT EXTRACTION    . GREEN LIGHT LASER TURP (TRANSURETHRAL RESECTION OF PROSTATE N/A 04/16/2015   Procedure: GREEN LIGHT LASER TURP (TRANSURETHRAL RESECTION OF PROSTATE WITH BLADDER BIOPSY;  Surgeon: Collier Flowers, MD;  Location: [facility] ORS;  Service: Urology;  Laterality: N/A;  . heart stent placement     1998, 2000, 2012  Prior to Admission medications   Medication Sig Start Date End Date Taking? Authorizing Provider  aspirin 81 MG tablet Take 81 mg by mouth daily.    Historical Provider, MD  atorvastatin (LIPITOR) 10 MG tablet Take 10 mg by mouth daily.    Historical Provider, MD  finasteride (PROSCAR) 5 MG tablet Take 5 mg by mouth daily.    Historical Provider, MD  hydrochlorothiazide (HYDRODIURIL) 25 MG tablet Take 25 mg by mouth daily.    Historical Provider, MD   isosorbide dinitrate (ISORDIL) 30 MG tablet Take 30 mg by mouth every morning.    Historical Provider, MD  Meth-Hyo-M Bl-Na Phos-Ph Sal (URIBEL) 118 MG CAPS Take 1 capsule (118 mg total) by mouth 4 (four) times daily. 03/12/16   Nori Riis, PA-C  Methen-Hyosc-Meth Blue-Na Phos (UROGESIC-BLUE) 81.6 MG TABS Take 1 tablet (81.6 mg total) by mouth 4 (four) times daily. 12/02/15   Cleon Gustin, MD  metoprolol (LOPRESSOR) 100 MG tablet Take 100 mg by mouth 2 (two) times daily.    Historical Provider, MD  nitroGLYCERIN (NITROSTAT) 0.4 MG SL tablet Place 0.4 mg under the tongue every 5 (five) minutes as needed for chest pain. Reported on 03/10/2016    Historical Provider, MD  omeprazole (PRILOSEC) 40 MG capsule Take 40 mg by mouth 2 (two) times daily.    Historical Provider, MD  predniSONE (DELTASONE) 50 MG tablet Take 1 tablet (50 mg total) by mouth daily with breakfast. 04/17/16   Charline Bills [place], PA-C  ramipril (ALTACE) 2.5 MG capsule Take 2.5 mg by mouth daily.    Historical Provider, MD  ranitidine (ZANTAC) 150 MG tablet Take 150 mg by mouth 2 (two) times daily. Reported on 03/10/2016    Historical Provider, MD  sucralfate (CARAFATE) 1 G tablet Take 1 g by mouth 2 (two) times daily.    Historical Provider, MD  tamsulosin (FLOMAX) 0.4 MG CAPS capsule Take 0.4 mg by mouth daily.    Historical Provider, MD  temazepam (RESTORIL) 30 MG capsule take 1 capsule by mouth at bedtime if needed 04/06/16   [name], MD  triamcinolone cream (KENALOG) 0.1 % Apply 1 application topically 4 (four) times daily. 04/17/16   Charline Bills Cuthriell, PA-C  zolpidem (AMBIEN) 10 MG tablet Take by mouth. Reported on 02/19/2016 10/03/12   Historical Provider, MD    Allergies Ciprofloxacin; Lisinopril; Penicillins; and Sulfa antibiotics  Family History  Problem Relation Age of Onset  . Osteoporosis Mother   . Alzheimer's disease Father   . Kidney disease Neg Hx   . Prostate cancer Neg Hx     Social  History Social History  Substance Use Topics  . Smoking status: Former Smoker    Packs/day: 1.50    Years: 12.00    Types: Cigarettes    Quit date: 08/23/1981  . Smokeless tobacco: Never Used  . Alcohol use No     Review of Systems  Constitutional: No fever/chills Eyes: No visual changes. No discharge ENT: No upper respiratory complaints. Cardiovascular: no chest pain. Respiratory: no cough. No SOB. Gastrointestinal: No abdominal pain.  No nausea, no vomiting.  Musculoskeletal: Negative for musculoskeletal pain. Skin: Positive for scattered hives Neurological: Negative for headaches, focal weakness or numbness. 10-point ROS otherwise negative.  ____________________________________________   PHYSICAL EXAM:  VITAL SIGNS: ED Triage Vitals [04/17/16 1518]  Enc Vitals Group     BP (!) 150/80     Pulse Rate 66     Resp 18  Temp 98.1 F (36.7 C)     Temp Source Oral     SpO2 95 %     Weight 240 lb (108.9 kg)     Height 6\' 1"  (1.854 m)     Head Circumference      Peak Flow      Pain Score      Pain Loc      Pain Edu?      Excl. in [place]?      Constitutional: Alert and oriented. Well appearing and in no acute distress. Eyes: Conjunctivae are normal. PERRL. EOMI. Head: Atraumatic. ENT:      Ears:       Nose: No congestion/rhinnorhea.      Mouth/Throat: Mucous membranes are moist. Oropharynx nonerythematous and nonedematous. No angioedema noted. Neck: No stridor.    Cardiovascular: Normal rate, regular rhythm. Normal S1 and S2.  Good peripheral circulation. Respiratory: Normal respiratory effort without tachypnea or retractions. Lungs CTAB. Good air entry to the bases with no decreased or absent breath sounds. Musculoskeletal: Full range of motion to all extremities. No gross deformities appreciated. Neurologic:  Normal speech and language. No gross focal neurologic deficits are appreciated.  Skin:  Skin is warm, dry and intact. Scattered erythematous, raised hives  noted to bilateral upper extremities, anterior torso, bilateral lower extremities. No facial involvement. Psychiatric: Mood and affect are normal. Speech and behavior are normal. Patient exhibits appropriate insight and judgement.   ____________________________________________   LABS (all labs ordered are listed, but only abnormal results are displayed)  Labs Reviewed - No data to display ____________________________________________  EKG   ____________________________________________  RADIOLOGY   No results found.  ____________________________________________    PROCEDURES  Procedure(s) performed:    Procedures    Medications  diphenhydrAMINE (BENADRYL) injection 50 mg (50 mg Intramuscular Given 04/17/16 1626)  famotidine (PEPCID) tablet 40 mg (40 mg Oral Given 04/17/16 1626)  methylPREDNISolone sodium succinate (SOLU-MEDROL) 125 mg/2 mL injection 125 mg (125 mg Intramuscular Given 04/17/16 1626)     ____________________________________________   INITIAL IMPRESSION / ASSESSMENT AND PLAN / ED COURSE  Pertinent labs & imaging results that were available during my care of the patient were reviewed by me and considered in my medical decision making (see chart for details).  Review of the [place] CSRS was performed in accordance of the [place] prior to dispensing any controlled drugs.  Clinical Course    Patient's diagnosis is consistent with Hives from unknown contact. Patient has not tried any medications at home. He was given injections of steroids, Benadryl, and oral famotidine here in the emergency department.. Patient will be discharged home with prescriptions for short course of steroids and topical. Patient is to follow up with primary care as needed or otherwise directed. Patient is given ED precautions to return to the ED for any worsening or new symptoms.     ____________________________________________  FINAL CLINICAL IMPRESSION(S) / ED DIAGNOSES  Final  diagnoses:  Hives  Urticaria      NEW MEDICATIONS STARTED DURING THIS VISIT:  New Prescriptions   PREDNISONE (DELTASONE) 50 MG TABLET    Take 1 tablet (50 mg total) by mouth daily with breakfast.   TRIAMCINOLONE CREAM (KENALOG) 0.1 %    Apply 1 application topically 4 (four) times daily.        This chart was dictated using voice recognition software/Dragon. Despite best efforts to proofread, errors can occur which can change the meaning. Any change was purely unintentional.    Charline Bills  [place], PA-C 04/17/16 1641    Eula Listen, MD 04/17/16 2338

## 2016-04-17 NOTE — ED Notes (Signed)
Patient states that yesterday he noticed a rash on his hands, arms, legs, under left breast, and on right buttock. Patient states that the rash is itchy. Patient denies any environmental changes but does states that he started taking a new vitamin for macular degeneration a few days ago.

## 2016-04-17 NOTE — ED Triage Notes (Signed)
Pt states that he started with hives and itching yesterday, pt states that he started a new vitamin 2 days ago for his eyes, uncertain of name, pt states that he also in and out caths tid

## 2016-04-26 ENCOUNTER — Emergency Department: Payer: Medicare Other

## 2016-04-26 ENCOUNTER — Encounter: Payer: Self-pay | Admitting: Emergency Medicine

## 2016-04-26 ENCOUNTER — Emergency Department
Admission: EM | Admit: 2016-04-26 | Discharge: 2016-04-26 | Disposition: A | Discharge status: Home / Self Care | Payer: Medicare Other | Attending: Emergency Medicine | Admitting: Emergency Medicine

## 2016-04-26 DIAGNOSIS — Z7982 Long term (current) use of aspirin: Secondary | ICD-10-CM | POA: Insufficient documentation

## 2016-04-26 DIAGNOSIS — R1030 Lower abdominal pain, unspecified: Secondary | ICD-10-CM | POA: Diagnosis not present

## 2016-04-26 DIAGNOSIS — Z87891 Personal history of nicotine dependence: Secondary | ICD-10-CM | POA: Insufficient documentation

## 2016-04-26 DIAGNOSIS — Z79899 Other long term (current) drug therapy: Secondary | ICD-10-CM | POA: Diagnosis not present

## 2016-04-26 DIAGNOSIS — R1032 Left lower quadrant pain: Secondary | ICD-10-CM | POA: Insufficient documentation

## 2016-04-26 DIAGNOSIS — K5900 Constipation, unspecified: Secondary | ICD-10-CM | POA: Diagnosis not present

## 2016-04-26 DIAGNOSIS — R109 Unspecified abdominal pain: Secondary | ICD-10-CM | POA: Diagnosis not present

## 2016-04-26 LAB — COMPREHENSIVE METABOLIC PANEL
ALK PHOS: 54 U/L (ref 38–126)
ALT: 19 U/L (ref 17–63)
ANION GAP: 8 (ref 5–15)
AST: 18 U/L (ref 15–41)
Albumin: 3.8 g/dL (ref 3.5–5.0)
BILIRUBIN TOTAL: 1.1 mg/dL (ref 0.3–1.2)
BUN: 20 mg/dL (ref 6–20)
CALCIUM: 8.3 mg/dL — AB (ref 8.9–10.3)
CO2: 27 mmol/L (ref 22–32)
Chloride: 99 mmol/L — ABNORMAL LOW (ref 101–111)
Creatinine, Ser: 1.16 mg/dL (ref 0.61–1.24)
GFR, EST NON AFRICAN AMERICAN: 57 mL/min — AB (ref >60)
Glucose, Bld: 143 mg/dL — ABNORMAL HIGH (ref 65–99)
POTASSIUM: 3.2 mmol/L — AB (ref 3.5–5.1)
Sodium: 134 mmol/L — ABNORMAL LOW (ref 135–145)
TOTAL PROTEIN: 6.8 g/dL (ref 6.5–8.1)

## 2016-04-26 LAB — CBC WITH DIFFERENTIAL/PLATELET
BASOS PCT: 0 %
Basophils Absolute: 0 10*3/uL (ref 0–0.1)
EOS ABS: 0.2 10*3/uL (ref 0–0.7)
EOS PCT: 2 %
HCT: 37.4 % — ABNORMAL LOW (ref 40.0–52.0)
Hemoglobin: 13.2 g/dL (ref 13.0–18.0)
LYMPHS ABS: 0.7 10*3/uL — AB (ref 1.0–3.6)
Lymphocytes Relative: 8 %
MCH: 31.1 pg (ref 26.0–34.0)
MCHC: 35.2 g/dL (ref 32.0–36.0)
MCV: 88.2 fL (ref 80.0–100.0)
Monocytes Absolute: 1.1 10*3/uL — ABNORMAL HIGH (ref 0.2–1.0)
Monocytes Relative: 12 %
NEUTROS PCT: 78 %
Neutro Abs: 7 10*3/uL — ABNORMAL HIGH (ref 1.4–6.5)
PLATELETS: 193 10*3/uL (ref 150–440)
RBC: 4.24 MIL/uL — AB (ref 4.40–5.90)
RDW: 16.1 % — ABNORMAL HIGH (ref 11.5–14.5)
WBC: 9.1 10*3/uL (ref 3.8–10.6)

## 2016-04-26 LAB — URINALYSIS COMPLETE WITH MICROSCOPIC (ARMC ONLY)
BILIRUBIN URINE: NEGATIVE
Bacteria, UA: NONE SEEN
Glucose, UA: NEGATIVE mg/dL
Hgb urine dipstick: NEGATIVE
KETONES UR: NEGATIVE mg/dL
Leukocytes, UA: NEGATIVE
NITRITE: NEGATIVE
PH: 5 (ref 5.0–8.0)
Protein, ur: 30 mg/dL — AB
SPECIFIC GRAVITY, URINE: 1.017 (ref 1.005–1.030)

## 2016-04-26 LAB — LIPASE, BLOOD: LIPASE: 21 U/L (ref 11–51)

## 2016-04-26 LAB — TROPONIN I

## 2016-04-26 MED ORDER — IOPAMIDOL (ISOVUE-300) INJECTION 61%
100.0000 mL | Freq: Once | INTRAVENOUS | Status: AC | PRN
  Administered 2016-04-26: 100 mL via INTRAVENOUS

## 2016-04-26 MED ORDER — IOPAMIDOL (ISOVUE-300) INJECTION 61%
30.0000 mL | Freq: Once | INTRAVENOUS | Status: AC | PRN
  Administered 2016-04-26: 30 mL via ORAL

## 2016-04-26 NOTE — ED Notes (Signed)
Pt states abd discomfort that began Friday, states he thought he was constipated and took a suppository and had a BM this AM, denies any nausea or vomiting, pt awake and alert in no acute distress

## 2016-04-26 NOTE — ED Notes (Signed)
Pt returned from CT, resting in bed 

## 2016-04-26 NOTE — ED Notes (Signed)
Pt resting in bed, resp even and unlabored, CT notified pt is done with contrast, TV turned on for pt, wife at bedside

## 2016-04-26 NOTE — ED Triage Notes (Signed)
Pt to ed with c/o abd pain and constipation x 2 days.

## 2016-04-26 NOTE — Discharge Instructions (Signed)
You were evaluated for abdominal pain, and although no certain cause was found, your exam and evaluation are reassuring today. It's possible that constipation may be contributing. You may continue to take over-the-counter stool softener such as Colace or milk of magnesia.  Return to the emergency department for any worsening abdominal pain, black or bloody stools, dizziness or passing out, vomiting, fever, or any other symptoms concerning to you.

## 2016-04-26 NOTE — ED Provider Notes (Signed)
[facility] Emergency Department Provider Note ____________________________________________   I have reviewed the triage vital signs and the triage nursing note.  HISTORY  Chief Complaint Abdominal Pain   Historian Patient  HPI Bryan Nelson is a 82 y.o. male with chart history significant for chronic abdominal pain, CAD, diverticulitis, lymphoma, bph and urinary retention for which he self-caths, presents today for mid and lower abdominal pain since Friday, now about the third day. At first he thought it was possibly constipated and so took a laxative yesterday. This morning he had a normal brown bowel movement. No black or bloody bowel movement. No nausea or vomiting. No fever. Patient states this feels different than when he has had diverticulitis in the past which was very low left lower quadrant. Pain is worse when he moves about especially if he brings his knees up to his abdomen. Laying on his back he states it's mild.  No chest pain or shortness of breath.    Past Medical History:  Diagnosis Date  . Arteriosclerosis of coronary artery 08/26/2011   Overview:      a.  1999 PCI of the mid LAD with stent.      b.  2002 Cath [place]: EF 56%. RCA-distal 25/25%. Left main-50% ostial.  Left circumflex-25% OM2.  LAD-25% proximal.  75% mid.  25/25% distal. 75% D1.      c.  07/2011 PCI of RCA with DES.[place]   . Bleeding gastrointestinal 08/26/2011   Overview:      a.  Chronic abdominal pain, present improving.      b.  Duodenitis and gastritis by EGD in 2000.      c.  Pylori in 2000, treated.      d.  Gastroesophageal reflux disease.      e.  Diverticular disease.    . BP (high blood pressure) 08/26/2011  . Cancer ([place]) 2015   Lymphoma  . Heart disease   . Helicobacter pylori infection 06/18/2015   by EGD RX 08/18/1999   . History of adenomatous polyp of colon 06/18/2015    Patient Active Problem List   Diagnosis Date Noted  . Recurrent UTI 12/21/2015  .  Urinary retention 09/24/2015  . Narrowing of intervertebral disc space 06/18/2015  . Diverticulitis 06/18/2015  . Esophageal reflux 06/18/2015  . Familial multiple lipoprotein-type hyperlipidemia 06/18/2015  . Insomnia 06/18/2015  . Vitamin D deficiency 06/18/2015  . Bladder retention 06/13/2014  . Lymphoma ([place]) 04/10/2014  . Adenopathy 02/19/2014  . Benign prostatic hyperplasia with urinary obstruction 08/03/2012  . ED (erectile dysfunction) of organic origin 08/03/2012  . Incomplete bladder emptying 08/03/2012  . Breath shortness 12/09/2011  . Benign prostatic hypertrophy without urinary obstruction 08/26/2011  . Arteriosclerosis of coronary artery 08/26/2011  . Bleeding gastrointestinal 08/26/2011  . HLD (hyperlipidemia) 08/26/2011  . BP (high blood pressure) 08/26/2011  . Disorder of peripheral nervous system ([place]) 08/26/2011  . Enlarged prostate 08/26/2011  . Peripheral nerve disease ([place]) 08/26/2011  . Current tobacco use 08/26/2011    Past Surgical History:  Procedure Laterality Date  . ANGIOPLASTY    . CATARACT EXTRACTION    . GREEN LIGHT LASER TURP (TRANSURETHRAL RESECTION OF PROSTATE N/A 04/16/2015   Procedure: GREEN LIGHT LASER TURP (TRANSURETHRAL RESECTION OF PROSTATE WITH BLADDER BIOPSY;  Surgeon: Collier Flowers, MD;  Location: [facility] ORS;  Service: Urology;  Laterality: N/A;  . heart stent placement     1998, 2000, 2012    Prior to Admission medications   Medication  Sig Start Date End Date Taking? Authorizing Provider  aspirin 81 MG tablet Take 81 mg by mouth daily.    Historical Provider, MD  atorvastatin (LIPITOR) 10 MG tablet Take 10 mg by mouth daily.    Historical Provider, MD  finasteride (PROSCAR) 5 MG tablet Take 5 mg by mouth daily.    Historical Provider, MD  hydrochlorothiazide (HYDRODIURIL) 25 MG tablet Take 25 mg by mouth daily.    Historical Provider, MD  isosorbide dinitrate (ISORDIL) 30 MG tablet Take 30 mg by mouth every morning.    Historical  Provider, MD  Meth-Hyo-M Bl-Na Phos-Ph Sal (URIBEL) 118 MG CAPS Take 1 capsule (118 mg total) by mouth 4 (four) times daily. 03/12/16   Nori Riis, PA-C  Methen-Hyosc-Meth Blue-Na Phos (UROGESIC-BLUE) 81.6 MG TABS Take 1 tablet (81.6 mg total) by mouth 4 (four) times daily. 12/02/15   Cleon Gustin, MD  metoprolol (LOPRESSOR) 100 MG tablet Take 100 mg by mouth 2 (two) times daily.    Historical Provider, MD  nitroGLYCERIN (NITROSTAT) 0.4 MG SL tablet Place 0.4 mg under the tongue every 5 (five) minutes as needed for chest pain. Reported on 03/10/2016    Historical Provider, MD  omeprazole (PRILOSEC) 40 MG capsule Take 40 mg by mouth 2 (two) times daily.    Historical Provider, MD  predniSONE (DELTASONE) 50 MG tablet Take 1 tablet (50 mg total) by mouth daily with breakfast. 04/17/16   Charline Bills Cuthriell, PA-C  ramipril (ALTACE) 2.5 MG capsule Take 2.5 mg by mouth daily.    Historical Provider, MD  ranitidine (ZANTAC) 150 MG tablet Take 150 mg by mouth 2 (two) times daily. Reported on 03/10/2016    Historical Provider, MD  sucralfate (CARAFATE) 1 G tablet Take 1 g by mouth 2 (two) times daily.    Historical Provider, MD  tamsulosin (FLOMAX) 0.4 MG CAPS capsule Take 0.4 mg by mouth daily.    Historical Provider, MD  temazepam (RESTORIL) 30 MG capsule take 1 capsule by mouth at bedtime if needed 04/06/16   [name], MD  triamcinolone cream (KENALOG) 0.1 % Apply 1 application topically 4 (four) times daily. 04/17/16   Charline Bills Cuthriell, PA-C  zolpidem (AMBIEN) 10 MG tablet Take 10 mg by mouth at bedtime as needed. Reported on 02/19/2016 10/03/12   Historical Provider, MD    Allergies  Allergen Reactions  . Ciprofloxacin Diarrhea  . Lisinopril Other (See Comments)  . Penicillins     unknwon reaction.  tolerates amoxicillin  . Sulfa Antibiotics Itching and Rash    Rash    Family History  Problem Relation Age of Onset  . Osteoporosis Mother   . Alzheimer's disease Father   .  Kidney disease Neg Hx   . Prostate cancer Neg Hx     Social History Social History  Substance Use Topics  . Smoking status: Former Smoker    Packs/day: 1.50    Years: 12.00    Types: Cigarettes    Quit date: 08/23/1981  . Smokeless tobacco: Never Used  . Alcohol use No    Review of Systems  Constitutional: Negative for fever. Eyes: Negative for visual changes. ENT: Negative for sore throat. Cardiovascular: Negative for chest pain. Respiratory: Negative for shortness of breath. Gastrointestinal: As per history of present illness. Genitourinary: Green urine from "medication" Musculoskeletal: Negative for back pain. Skin: Recent hives, completed prednisone on thursday Neurological: Negative for headache. 10 point Review of Systems otherwise negative ____________________________________________   PHYSICAL EXAM:  VITAL SIGNS: ED Triage Vitals  Enc Vitals Group     BP 04/26/16 0802 128/64     Pulse Rate 04/26/16 0802 81     Resp 04/26/16 0802 18     Temp 04/26/16 0802 98.6 F (37 C)     Temp Source 04/26/16 0802 Oral     SpO2 04/26/16 0802 100 %     Weight 04/26/16 0800 240 lb (108.9 kg)     Height 04/26/16 0800 6\' 1"  (1.854 m)     Head Circumference --      Peak Flow --      Pain Score 04/26/16 0800 6     Pain Loc --      Pain Edu? --      Excl. in [place]? --      Constitutional: Alert and oriented. Well appearing and in no distress. HEENT   Head: Normocephalic and atraumatic.      Eyes: Conjunctivae are normal. PERRL. Normal extraocular movements.      Ears:         Nose: No congestion/rhinnorhea.   Mouth/Throat: Mucous membranes are moist.   Neck: No stridor. Cardiovascular/Chest: Normal rate, regular rhythm.  No murmurs, rubs, or gallops. Respiratory: Normal respiratory effort without tachypnea nor retractions. Breath sounds are clear and equal bilaterally. No wheezes/rales/rhonchi. Gastrointestinal: Soft. No distention, no guarding, no rebound. Mild  to moderate abdominal pain in the mid and lower abdomen  Genitourinary/rectal:Deferred Musculoskeletal: Nontender with normal range of motion in all extremities. No joint effusions.  No lower extremity tenderness.  No edema. Neurologic:  Normal speech and language. No gross or focal neurologic deficits are appreciated. Skin:  Skin is warm, dry and intact. No rash noted. Psychiatric: Mood and affect are normal. Speech and behavior are normal. Patient exhibits appropriate insight and judgment.  ____________________________________________   EKG I, Lisa Roca, MD, the attending physician have personally viewed and interpreted all ECGs.  66 bpm. Normal sinus rhythm. Narrow QRS. Normal axis. Normal ST and T-wave. ____________________________________________  LABS (pertinent positives/negatives)  Labs Reviewed  COMPREHENSIVE METABOLIC PANEL - Abnormal; Notable for the following:       Result Value   Sodium 134 (*)    Potassium 3.2 (*)    Chloride 99 (*)    Glucose, Bld 143 (*)    Calcium 8.3 (*)    GFR calc non Af Amer 57 (*)    All other components within normal limits  CBC WITH DIFFERENTIAL/PLATELET - Abnormal; Notable for the following:    RBC 4.24 (*)    HCT 37.4 (*)    RDW 16.1 (*)    Neutro Abs 7.0 (*)    Lymphs Abs 0.7 (*)    Monocytes Absolute 1.1 (*)    All other components within normal limits  URINALYSIS COMPLETEWITH MICROSCOPIC ([facility] ONLY) - Abnormal; Notable for the following:    Color, Urine BLUE (*)    APPearance CLEAR (*)    Protein, ur 30 (*)    Squamous Epithelial / LPF 0-5 (*)    All other components within normal limits  LIPASE, BLOOD  TROPONIN I    ____________________________________________  RADIOLOGY All Xrays were viewed by me. Imaging interpreted by Radiologist.  Chest and abdomen xray:  IMPRESSION: Nonobstructive bowel gas pattern. Moderate stool burden, without significant change.  New mild left lower lobe atelectasis versus infiltrate.  Continued radiographic followup recommended.  CT abd and pelvis:    CLINICAL DATA: 82 year old male with a history of abdominal pain  EXAM:  CT ABDOMEN AND PELVIS WITH CONTRAST  TECHNIQUE: Multidetector CT imaging of the abdomen and pelvis was performed using the standard protocol following bolus administration of intravenous contrast.  CONTRAST: 126mL ISOVUE-300 IOPAMIDOL (ISOVUE-300) INJECTION 61%  COMPARISON: PET-CT 09/15/2015, abdomen CT 08/22/2014  FINDINGS: Lower chest:  Unremarkable appearance of the soft tissues of the chest wall.  Heart size within normal limits. No pericardial fluid/thickening.  Calcifications in the distribution of the coronary arteries. Partially visualized catheter tip in the right atrium.  No lower mediastinal adenopathy.  Unremarkable appearance of the distal esophagus.  Small hiatal hernia.  No confluent airspace disease, pleural fluid, or pneumothorax within visualized lung.  Abdomen/pelvis:  Unremarkable appearance of liver and spleen.  Unremarkable appearance of bilateral adrenal glands.  No peripancreatic or pericholecystic fluid or inflammatory changes.  No radio-opaque gallstones.  No intrahepatic or extrahepatic biliary ductal dilatation.  No intra-peritoneal free air or significant free-fluid.  No abnormally dilated small bowel or colon. No transition point. No significant stool burden. No inflammatory changes of the mesenteries.  Normal appendix identified.  Colonic diverticular disease without associated inflammatory changes.  Right Kidney/Ureter:  No hydronephrosis or nephrolithiasis. Symmetric enhancement to the left kidney. Low-density Bosniak 1 cysts of the right kidney again demonstrated.  Left Kidney/Ureter:  No hydronephrosis or nephrolithiasis. Symmetric enhancement compared to the right. Low-density Bosniak 1 cysts of the left kidney, unchanged from prior.  Diverticulum at the left aspect of  the urinary bladder.  Prostatic calcifications.  Calcifications of the abdominal aorta with atherosclerotic changes. No dissection or aneurysm. Calcifications extend to the bilateral iliofemoral system. Proximal femoral vasculature patent bilaterally.  Musculoskeletal:  No displaced fracture. Multilevel degenerative changes of the spine. Compression fracture of T12 is remote, with no significant progression of height loss. Facet changes bilateral lumbar facets.  No significant degenerative changes of the spine.  IMPRESSION: No acute finding to account for the patient's symptoms.  Diverticular disease without acute diverticulitis.  Left bladder base diverticulum.  Aortic atherosclerosis and associated coronary artery disease.      __________________________________________  PROCEDURES  Procedure(s) performed: None  Critical Care performed: None  ____________________________________________   ED COURSE / ASSESSMENT AND PLAN  Pertinent labs & imaging results that were available during my care of the patient were reviewed by me and considered in my medical decision making (see chart for details).   Mr. Siler states he thought his symptoms may be due to constipation, however he wasn't sure and wanted to be evaluated.  On exam he has mild to moderate discomfort, and reassuring laboratory studies and x-ray, but given his age and symptoms, I felt concerned enough to image the abdomen.  CT the abdomen did not reveal any certain cause for his symptoms. At this point, the patient does have a diagnosed in the computer for chronic abdominal pain, although the patient thinks he is probably having constipation, and this may be the source of his discomfort.  Ultimately he is well appearing and able to be discharged home.  He has mild hypokalemia and was given 1 potassium supplement here.  X-ray commented on atelectasis versus infiltrate, but clinically the patient is not having  any respiratory symptoms and I am not concerned about pneumonia/infiltrate.    CONSULTATIONS:   None   Patient / Family / Caregiver informed of clinical course, medical decision-making process, and agree with plan.   I discussed return precautions, follow-up instructions, and discharged instructions with patient and/or family.   ___________________________________________   FINAL CLINICAL IMPRESSION(S) /  ED DIAGNOSES   Final diagnoses:  Abdominal pain of unknown cause, unspecified laterality  Lower abdominal pain              Note: This dictation was prepared with Dragon dictation. Any transcriptional errors that result from this process are unintentional    Lisa Roca, MD 04/26/16 1215

## 2016-04-26 NOTE — ED Notes (Signed)
EDP at bedside  

## 2016-04-26 NOTE — ED Notes (Signed)
Patient transported to CT 

## 2016-04-26 NOTE — ED Notes (Signed)
Pt returned from xray, resting in bed 

## 2016-04-26 NOTE — ED Notes (Signed)
Patient transported to X-ray 

## 2016-05-06 ENCOUNTER — Ambulatory Visit: Payer: Medicare Other | Admitting: Family Medicine

## 2016-05-06 VITALS — BP 128/78 | HR 69 | Ht 73.0 in | Wt 246.5 lb

## 2016-05-06 DIAGNOSIS — N390 Urinary tract infection, site not specified: Secondary | ICD-10-CM | POA: Diagnosis not present

## 2016-05-06 DIAGNOSIS — H353132 Nonexudative age-related macular degeneration, bilateral, intermediate dry stage: Secondary | ICD-10-CM | POA: Diagnosis not present

## 2016-05-06 DIAGNOSIS — Z961 Presence of intraocular lens: Secondary | ICD-10-CM | POA: Diagnosis not present

## 2016-05-06 LAB — MICROSCOPIC EXAMINATION
Bacteria, UA: NONE SEEN
RBC MICROSCOPIC, UA: NONE SEEN /HPF (ref 0–?)

## 2016-05-06 LAB — URINALYSIS, COMPLETE
BILIRUBIN UA: NEGATIVE
Glucose, UA: NEGATIVE
KETONES UA: NEGATIVE
Leukocytes, UA: NEGATIVE
NITRITE UA: NEGATIVE
Protein, UA: NEGATIVE
RBC UA: NEGATIVE
SPEC GRAV UA: 1.015 (ref 1.005–1.030)
Urobilinogen, Ur: 0.2 mg/dL (ref 0.2–1.0)
pH, UA: 6 (ref 5.0–7.5)

## 2016-05-06 NOTE — Progress Notes (Signed)
Patient presents today with urinary frequency and burning. Symptoms started yesterday. He has not been n ABX in the last 30 days, is not in suppressive abx, he has not had a Urological surgery in the last 30 days.

## 2016-05-07 ENCOUNTER — Ambulatory Visit (INDEPENDENT_AMBULATORY_CARE_PROVIDER_SITE_OTHER): Payer: Medicare Other | Admitting: Family Medicine

## 2016-05-07 ENCOUNTER — Encounter: Payer: Self-pay | Admitting: Family Medicine

## 2016-05-07 VITALS — BP 112/60 | HR 71 | Temp 98.0°F | Resp 16 | Wt 249.6 lb

## 2016-05-07 DIAGNOSIS — E876 Hypokalemia: Secondary | ICD-10-CM | POA: Diagnosis not present

## 2016-05-07 DIAGNOSIS — R251 Tremor, unspecified: Secondary | ICD-10-CM

## 2016-05-07 NOTE — Progress Notes (Signed)
Patient: Bryan Nelson Male    DOB: 1931/10/30   82 y.o.   MRN: [id number] Visit Date: 05/07/2016  Today's Provider: Vernie Murders, PA   Chief Complaint  Patient presents with  . Dizziness   Subjective:    Dizziness  This is a new problem. The current episode started in the past 7 days. The problem occurs constantly. The problem has been unchanged. Associated symptoms comments: shakey. The symptoms are aggravated by bending. He has tried walking for the symptoms. The treatment provided no relief.   Past Medical History:  Diagnosis Date  . Arteriosclerosis of coronary artery 08/26/2011   Overview:      a.  1999 PCI of the mid LAD with stent.      b.  2002 Cath [place]: EF 56%. RCA-distal 25/25%. Left main-50% ostial.  Left circumflex-25% OM2.  LAD-25% proximal.  75% mid.  25/25% distal. 75% D1.      c.  07/2011 PCI of RCA with DES.[place]   . Bleeding gastrointestinal 08/26/2011   Overview:      a.  Chronic abdominal pain, present improving.      b.  Duodenitis and gastritis by EGD in 2000.      c.  Pylori in 2000, treated.      d.  Gastroesophageal reflux disease.      e.  Diverticular disease.    . BP (high blood pressure) 08/26/2011  . Cancer ([place]) 2015   Lymphoma  . Heart disease   . Helicobacter pylori infection 06/18/2015   by EGD RX 08/18/1999   . History of adenomatous polyp of colon 06/18/2015   Past Surgical History:  Procedure Laterality Date  . ANGIOPLASTY    . CATARACT EXTRACTION    . GREEN LIGHT LASER TURP (TRANSURETHRAL RESECTION OF PROSTATE N/A 04/16/2015   Procedure: GREEN LIGHT LASER TURP (TRANSURETHRAL RESECTION OF PROSTATE WITH BLADDER BIOPSY;  Surgeon: Collier Flowers, MD;  Location: [facility] ORS;  Service: Urology;  Laterality: N/A;  . heart stent placement     1998, 2000, 2012   Family History  Problem Relation Age of Onset  . Osteoporosis Mother   . Alzheimer's disease Father   . Kidney disease Neg Hx   . Prostate cancer Neg Hx    Allergies  Allergen  Reactions  . Ciprofloxacin Diarrhea  . Lisinopril Other (See Comments)  . Penicillins     unknwon reaction.  tolerates amoxicillin  . Sulfa Antibiotics Itching and Rash    Rash     Previous Medications   ASPIRIN 81 MG TABLET    Take 81 mg by mouth daily.   ATORVASTATIN (LIPITOR) 10 MG TABLET    Take 10 mg by mouth daily.   FINASTERIDE (PROSCAR) 5 MG TABLET    Take 5 mg by mouth daily.   HYDROCHLOROTHIAZIDE (HYDRODIURIL) 25 MG TABLET    Take 25 mg by mouth daily.   ISOSORBIDE DINITRATE (ISORDIL) 30 MG TABLET    Take 30 mg by mouth every morning.   METH-HYO-M BL-NA PHOS-PH SAL (URIBEL) 118 MG CAPS    Take 1 capsule (118 mg total) by mouth 4 (four) times daily.   METHEN-HYOSC-METH BLUE-NA PHOS (UROGESIC-BLUE) 81.6 MG TABS    Take 1 tablet (81.6 mg total) by mouth 4 (four) times daily.   METOPROLOL (LOPRESSOR) 100 MG TABLET    Take 100 mg by mouth 2 (two) times daily.   NITROGLYCERIN (NITROSTAT) 0.4 MG SL TABLET    Place 0.4 mg under the tongue  every 5 (five) minutes as needed for chest pain. Reported on 03/10/2016   OMEPRAZOLE (PRILOSEC) 40 MG CAPSULE    Take 40 mg by mouth 2 (two) times daily.   PREDNISONE (DELTASONE) 50 MG TABLET    Take 1 tablet (50 mg total) by mouth daily with breakfast.   RAMIPRIL (ALTACE) 2.5 MG CAPSULE    Take 2.5 mg by mouth daily.   RANITIDINE (ZANTAC) 150 MG TABLET    Take 150 mg by mouth 2 (two) times daily. Reported on 03/10/2016   SUCRALFATE (CARAFATE) 1 G TABLET    Take 1 g by mouth 2 (two) times daily.   TAMSULOSIN (FLOMAX) 0.4 MG CAPS CAPSULE    Take 0.4 mg by mouth daily.   TEMAZEPAM (RESTORIL) 30 MG CAPSULE    take 1 capsule by mouth at bedtime if needed   TRIAMCINOLONE CREAM (KENALOG) 0.1 %    Apply 1 application topically 4 (four) times daily.   ZOLPIDEM (AMBIEN) 10 MG TABLET    Take 10 mg by mouth at bedtime as needed. Reported on 02/19/2016    Review of Systems  Constitutional: Negative.   Respiratory: Negative.   Cardiovascular: Negative.     Neurological: Positive for dizziness.    Social History  Substance Use Topics  . Smoking status: Former Smoker    Packs/day: 1.50    Years: 12.00    Types: Cigarettes    Quit date: 08/23/1981  . Smokeless tobacco: Never Used  . Alcohol use No   Objective:   BP 112/60 (BP Location: Right Arm, Patient Position: Sitting, Cuff Size: Large)   Pulse 71   Temp 98 F (36.7 C) (Oral)   Resp 16   Wt 249 lb 9.6 oz (113.2 kg)   SpO2 97%   BMI 32.93 kg/m   Physical Exam  Constitutional: He is oriented to person, place, and time. He appears well-developed and well-nourished. No distress.  HENT:  Head: Normocephalic and atraumatic.  Right Ear: Hearing and external ear normal.  Left Ear: Hearing and external ear normal.  Nose: Nose normal.  Bilateral hearing loss with hearing aids in place.  Eyes: Conjunctivae and lids are normal. Right eye exhibits no discharge. Left eye exhibits no discharge. No scleral icterus.  Dilated right pupil with history of cataract surgery bilaterally.  Cardiovascular: Normal rate and regular rhythm.   Pulmonary/Chest: Effort normal and breath sounds normal. No respiratory distress.  Abdominal: Soft. Bowel sounds are normal.  Musculoskeletal: Normal range of motion.  Neurological: He is alert and oriented to person, place, and time. He has normal reflexes.  Slight faint tremor bilateral hands.  Skin: Skin is intact. No lesion and no rash noted.  Psychiatric: He has a normal mood and affect. His speech is normal and behavior is normal. Thought content normal.      Assessment & Plan:     1. Tremor Mild and intermittent since evaluation at the ER for abdominal pain. CT scan essentially unremarkable except constipation. Had large BM as he was about to leave the hospital and immediately abdominal pain abated. Labs showed low potassium. Will recheck labs for further imbalance. May need referral to neurologist if tremor worsens. - CBC with Differential/Platelet -  Comprehensive metabolic panel - TSH  2. Hypokalemia K+ was 3.2 in the ER on 04-26-16. Was given "one" potassium pills before leaving the ER. Will recheck labs to see if further supplementation needed. - CBC with Differential/Platelet - Comprehensive metabolic panel

## 2016-05-08 LAB — CBC WITH DIFFERENTIAL/PLATELET
BASOS ABS: 0 10*3/uL (ref 0.0–0.2)
Basos: 1 %
EOS (ABSOLUTE): 0.5 10*3/uL — AB (ref 0.0–0.4)
Eos: 11 %
Hematocrit: 34.4 % — ABNORMAL LOW (ref 37.5–51.0)
Hemoglobin: 11.9 g/dL — ABNORMAL LOW (ref 12.6–17.7)
Immature Grans (Abs): 0 10*3/uL (ref 0.0–0.1)
Immature Granulocytes: 0 %
LYMPHS ABS: 0.8 10*3/uL (ref 0.7–3.1)
Lymphs: 19 %
MCH: 30 pg (ref 26.6–33.0)
MCHC: 34.6 g/dL (ref 31.5–35.7)
MCV: 87 fL (ref 79–97)
Monocytes Absolute: 0.5 10*3/uL (ref 0.1–0.9)
Monocytes: 12 %
NEUTROS ABS: 2.4 10*3/uL (ref 1.4–7.0)
Neutrophils: 57 %
PLATELETS: 362 10*3/uL (ref 150–379)
RBC: 3.97 x10E6/uL — ABNORMAL LOW (ref 4.14–5.80)
RDW: 14.8 % (ref 12.3–15.4)
WBC: 4.1 10*3/uL (ref 3.4–10.8)

## 2016-05-08 LAB — COMPREHENSIVE METABOLIC PANEL
ALBUMIN: 3.9 g/dL (ref 3.5–4.7)
ALK PHOS: 77 IU/L (ref 39–117)
ALT: 26 IU/L (ref 0–44)
AST: 17 IU/L (ref 0–40)
Albumin/Globulin Ratio: 1.6 (ref 1.2–2.2)
BUN / CREAT RATIO: 13 (ref 10–24)
BUN: 16 mg/dL (ref 8–27)
CHLORIDE: 100 mmol/L (ref 96–106)
CO2: 26 mmol/L (ref 18–29)
Calcium: 9 mg/dL (ref 8.6–10.2)
Creatinine, Ser: 1.19 mg/dL (ref 0.76–1.27)
GFR calc Af Amer: 65 mL/min/{1.73_m2} (ref >59)
GFR calc non Af Amer: 57 mL/min/{1.73_m2} — ABNORMAL LOW (ref >59)
GLUCOSE: 98 mg/dL (ref 65–99)
Globulin, Total: 2.4 g/dL (ref 1.5–4.5)
Potassium: 4.5 mmol/L (ref 3.5–5.2)
Sodium: 143 mmol/L (ref 134–144)
Total Protein: 6.3 g/dL (ref 6.0–8.5)

## 2016-05-08 LAB — TSH: TSH: 3.57 u[IU]/mL (ref 0.450–4.500)

## 2016-05-09 LAB — CULTURE, URINE COMPREHENSIVE

## 2016-05-10 ENCOUNTER — Telehealth: Payer: Self-pay

## 2016-05-10 NOTE — Telephone Encounter (Signed)
-----   Message from Margo Common, [place] sent at 05/10/2016  8:41 AM EDT ----- Potassium level back to normal. No sign of thyroid disease. All tests normal except hemoglobin dropped a little from previous readings. May be related to lymphoma followed by Dr. Grayland Ormond. Recheck with Dr. Grayland Ormond as planned. If tremor worsens, may need referral to neurologist.

## 2016-05-10 NOTE — Telephone Encounter (Signed)
-----   Message from Bryan Riis, PA-C sent at 05/09/2016  1:07 PM EDT ----- Please notify the patient that his urine culture is negative.

## 2016-05-10 NOTE — Telephone Encounter (Signed)
Pt advised.  He noticed he does not get dizzy until after he takes his medicines in the morning.  He reports he feels fine when he first wakes up.  Pt is would like to try and stop some of the medications to see if that would help.  Please advise.  Thanks,   -Mickel Baas

## 2016-05-10 NOTE — Telephone Encounter (Signed)
Pt advised.   Thanks,   -Shlome Baldree  

## 2016-05-10 NOTE — Telephone Encounter (Signed)
Spoke with pt in reference to -ucx. Pt voiced understanding.  

## 2016-05-10 NOTE — Telephone Encounter (Signed)
LMTCB 05/10/2016  Thanks,   -Mickel Baas

## 2016-05-10 NOTE — Telephone Encounter (Signed)
Recommend stopping Zolpidem, Temazepam and possibly Ramipril (especially if BP still low). Recheck BP in a month.

## 2016-06-01 DIAGNOSIS — Z23 Encounter for immunization: Secondary | ICD-10-CM | POA: Diagnosis not present

## 2016-06-15 ENCOUNTER — Ambulatory Visit (INDEPENDENT_AMBULATORY_CARE_PROVIDER_SITE_OTHER): Payer: Medicare Other | Admitting: Urology

## 2016-06-15 ENCOUNTER — Encounter: Payer: Self-pay | Admitting: Urology

## 2016-06-15 VITALS — BP 133/74 | HR 76 | Ht 69.0 in | Wt 251.3 lb

## 2016-06-15 DIAGNOSIS — Z87440 Personal history of urinary (tract) infections: Secondary | ICD-10-CM | POA: Diagnosis not present

## 2016-06-15 DIAGNOSIS — R339 Retention of urine, unspecified: Secondary | ICD-10-CM | POA: Diagnosis not present

## 2016-06-15 LAB — BLADDER SCAN AMB NON-IMAGING: SCAN RESULT: 86

## 2016-06-15 MED ORDER — FINASTERIDE 5 MG PO TABS: 5.0000 mg | ORAL_TABLET | Freq: Every day | ORAL | 12 refills | Status: DC

## 2016-06-15 NOTE — Progress Notes (Signed)
Recurrent uti

## 2016-06-15 NOTE — Progress Notes (Signed)
9:35 AM   Bryan Nelson [birth year] [id number]  Referring provider: [name], MD [address] [place], [place] [address]  Chief Complaint  Patient presents with  . Recurrent UTI    6 month follow up  . Urinary Retention    HPI: Patient is an 82 year old Caucasian male who presents today for a scheduled 6 month follow-up who has a history of recurrent UTI's and is currently cathing himself 2-3 times daily in an effort to keep his postvoid residuals below 200 cc.  He is currently using a new product from [place], [place] cath, and so far has had success with this catheter.   He is not experiencing symptoms of urinary tract infection at this time.  He specifically denies hematuria, dysuria and suprapubic pain.  He states he is keeping his residuals down around 200 cc's. He did  bring in his log book for review.   He has not had any recent fevers, chills, nausea or vomiting.    He is having episodes of suprapubic pain in the night when he wakes up with an urge to urinate.  He then tries to urinate on his own and cannot.  He does not cath himself at that time.  He is of the understanding that he can only CIC x 3 a day.    He has refused to fill out his IPSS score sheet.  His PVR is 86 mL.    PMH: Past Medical History:  Diagnosis Date  . Arteriosclerosis of coronary artery 08/26/2011   Overview:      a.  1999 PCI of the mid LAD with stent.      b.  2002 Cath [place]: EF 56%. RCA-distal 25/25%. Left main-50% ostial.  Left circumflex-25% OM2.  LAD-25% proximal.  75% mid.  25/25% distal. 75% D1.      c.  07/2011 PCI of RCA with DES.[place]   . Bleeding gastrointestinal 08/26/2011   Overview:      a.  Chronic abdominal pain, present improving.      b.  Duodenitis and gastritis by EGD in 2000.      c.  Pylori in 2000, treated.      d.  Gastroesophageal reflux disease.      e.  Diverticular disease.    . BP (high blood pressure) 08/26/2011  . Cancer ([place]) 2015   Lymphoma    . Heart disease   . Helicobacter pylori infection 06/18/2015   by EGD RX 08/18/1999   . History of adenomatous polyp of colon 06/18/2015    Surgical History: Past Surgical History:  Procedure Laterality Date  . ANGIOPLASTY    . CATARACT EXTRACTION    . GREEN LIGHT LASER TURP (TRANSURETHRAL RESECTION OF PROSTATE N/A 04/16/2015   Procedure: GREEN LIGHT LASER TURP (TRANSURETHRAL RESECTION OF PROSTATE WITH BLADDER BIOPSY;  Surgeon: Collier Flowers, MD;  Location: [facility] ORS;  Service: Urology;  Laterality: N/A;  . heart stent placement     1998, 2000, 2012    Home Medications:    Medication List       Accurate as of 06/15/16  9:35 AM. Always use your most recent med list.          aspirin 81 MG tablet Take 81 mg by mouth daily.   atorvastatin 10 MG tablet Commonly known as:  LIPITOR Take 10 mg by mouth daily.   clopidogrel 75 MG tablet Commonly known as:  PLAVIX Take 75 mg by mouth daily.  finasteride 5 MG tablet Commonly known as:  PROSCAR Take 1 tablet (5 mg total) by mouth daily.   hydrochlorothiazide 25 MG tablet Commonly known as:  HYDRODIURIL Take 25 mg by mouth daily.   isosorbide dinitrate 30 MG tablet Commonly known as:  ISORDIL Take 30 mg by mouth every morning.   metoprolol 100 MG tablet Commonly known as:  LOPRESSOR Take 100 mg by mouth 2 (two) times daily.   nitroGLYCERIN 0.4 MG SL tablet Commonly known as:  NITROSTAT Place 0.4 mg under the tongue every 5 (five) minutes as needed for chest pain. Reported on 03/10/2016   omeprazole 40 MG capsule Commonly known as:  PRILOSEC Take 40 mg by mouth 2 (two) times daily.   predniSONE 50 MG tablet Commonly known as:  DELTASONE Take 1 tablet (50 mg total) by mouth daily with breakfast.   ramipril 2.5 MG capsule Commonly known as:  ALTACE Take 2.5 mg by mouth daily.   sucralfate 1 g tablet Commonly known as:  CARAFATE Take 1 g by mouth 2 (two) times daily.   tamsulosin 0.4 MG Caps  capsule Commonly known as:  FLOMAX Take 0.4 mg by mouth daily.   temazepam 30 MG capsule Commonly known as:  RESTORIL take 1 capsule by mouth at bedtime if needed   triamcinolone cream 0.1 % Commonly known as:  KENALOG Apply 1 application topically 4 (four) times daily.   URIBEL 118 MG Caps Take 1 capsule (118 mg total) by mouth 4 (four) times daily.   UROGESIC-BLUE 81.6 MG Tabs Take 1 tablet (81.6 mg total) by mouth 4 (four) times daily.   ZANTAC 150 MG tablet Generic drug:  ranitidine Take 150 mg by mouth 2 (two) times daily. Reported on 03/10/2016   zolpidem 10 MG tablet Commonly known as:  AMBIEN Take 10 mg by mouth at bedtime as needed. Reported on 02/19/2016       Allergies:  Allergies  Allergen Reactions  . Ciprofloxacin Diarrhea  . Lisinopril Other (See Comments)  . Penicillins     unknwon reaction.  tolerates amoxicillin  . Sulfa Antibiotics Itching and Rash    Rash    Family History: Family History  Problem Relation Age of Onset  . Osteoporosis Mother   . Alzheimer's disease Father   . Kidney disease Neg Hx   . Prostate cancer Neg Hx     Social History:  reports that he quit smoking about 34 years ago. His smoking use included Cigarettes. He has a 18.00 pack-year smoking history. He has never used smokeless tobacco. He reports that he does not drink alcohol or use drugs.  ROS: UROLOGY Frequent Urination?: No Hard to postpone urination?: No Burning/pain with urination?: No Get up at night to urinate?: No Leakage of urine?: No Urine stream starts and stops?: No Trouble starting stream?: No Do you have to strain to urinate?: No Blood in urine?: No Urinary tract infection?: No Sexually transmitted disease?: No Injury to kidneys or bladder?: No Painful intercourse?: No Weak stream?: No Erection problems?: Yes Penile pain?: No  Gastrointestinal Nausea?: No Vomiting?: No Indigestion/heartburn?: No Diarrhea?: No Constipation?:  No  Constitutional Fever: No Night sweats?: No Weight loss?: No Fatigue?: No  Skin Skin rash/lesions?: No Itching?: No  Eyes Blurred vision?: No Double vision?: No  Ears/Nose/Throat Sore throat?: No Sinus problems?: No  Physical Exam: BP 133/74   Pulse 76   Ht 5\' 9"  (1.753 m)   Wt 251 lb 4.8 oz (114 kg)   BMI 37.11  kg/m   Constitutional: Well nourished. Alert and oriented, No acute distress. HEENT: [place] AT, moist mucus membranes. Trachea midline, no masses. Cardiovascular: No clubbing, cyanosis, or edema. Respiratory: Normal respiratory effort, no increased work of breathing. GI: Abdomen is soft, non tender, non distended, no abdominal masses. Liver and spleen not palpable.  No hernias appreciated.  Stool sample for occult testing is not indicated.   GU: No CVA tenderness.  No bladder fullness or masses.  Patient with circumcised phallus.  Urethral meatus is patent.  No penile discharge. No penile lesions or rashes. Scrotum without lesions, cysts, rashes and/or edema.  Testicles are located scrotally bilaterally. No masses are appreciated in the testicles. Left and right epididymis are normal. Rectal: Patient with  normal sphincter tone. Anus and perineum without scarring or rashes. No rectal masses are appreciated. Prostate is approximately 50 grams, no nodules are appreciated. Seminal vesicles are normal. Skin: No rashes, bruises or suspicious lesions. Lymph: No cervical or inguinal adenopathy. Neurologic: Grossly intact, no focal deficits, moving all 4 extremities. Psychiatric: Normal mood and affect.  Laboratory Data: Lab Results  Component Value Date   WBC 4.1 05/07/2016   HGB 13.2 04/26/2016   HCT 34.4 (L) 05/07/2016   MCV 87 05/07/2016   PLT 362 05/07/2016    Lab Results  Component Value Date   CREATININE 1.19 05/07/2016    Lab Results  Component Value Date   PSA 0.9 02/27/2014     Lab Results  Component Value Date   AST 17 05/07/2016   Lab  Results  Component Value Date   ALT 26 05/07/2016    Pertinent Imaging Results for orders placed or performed in visit on 06/15/16  BLADDER SCAN AMB NON-IMAGING  Result Value Ref Range   Scan Result 86      Assessment & Plan:    1. Urinary retention  - Patient caths four times daily, indefinitely due to urinary retention  - Patient requires [place], [place] cath  - Log book notes residuals of 200 to 300 cc  - Will discontinue the tamsulosin and continue the finasteride; refill given for finasteride  - He will RTC in one year for IPSS and PVR   2. History of recurrent UTI's  - patient has not had an UTI since 10/31/2015  - reviewed UTI prevention  - reminded patient to contact us for symptoms of an UTI  - RTC in one year for a recheck   Return in about 1 year (around 06/15/2017) for IPSS, PVR and exam.  These notes generated with voice recognition software. I apologize for typographical errors.  Zara Council, [facility] [address] [place], [place] [address] [phone]

## 2016-06-18 ENCOUNTER — Inpatient Hospital Stay: Payer: Medicare Other | Attending: Oncology

## 2016-06-18 DIAGNOSIS — Z452 Encounter for adjustment and management of vascular access device: Secondary | ICD-10-CM | POA: Insufficient documentation

## 2016-06-18 DIAGNOSIS — Z8572 Personal history of non-Hodgkin lymphomas: Secondary | ICD-10-CM | POA: Insufficient documentation

## 2016-06-18 DIAGNOSIS — C801 Malignant (primary) neoplasm, unspecified: Secondary | ICD-10-CM

## 2016-06-18 MED ORDER — HEPARIN SOD (PORK) LOCK FLUSH 100 UNIT/ML IV SOLN
500.0000 [IU] | Freq: Once | INTRAVENOUS | Status: AC
  Administered 2016-06-18: 500 [IU] via INTRAVENOUS

## 2016-06-18 MED ORDER — HEPARIN SOD (PORK) LOCK FLUSH 100 UNIT/ML IV SOLN
INTRAVENOUS | Status: AC
  Filled 2016-06-18: qty 5

## 2016-06-18 MED ORDER — SODIUM CHLORIDE 0.9% FLUSH
10.0000 mL | INTRAVENOUS | Status: DC | PRN
  Administered 2016-06-18: 10 mL via INTRAVENOUS
  Filled 2016-06-18: qty 10

## 2016-07-07 ENCOUNTER — Encounter: Payer: Self-pay | Admitting: Family Medicine

## 2016-07-07 ENCOUNTER — Ambulatory Visit (INDEPENDENT_AMBULATORY_CARE_PROVIDER_SITE_OTHER): Payer: Medicare Other | Admitting: Family Medicine

## 2016-07-07 VITALS — BP 120/64 | HR 60 | Temp 98.4°F | Resp 16 | Wt 254.0 lb

## 2016-07-07 DIAGNOSIS — K2100 Gastro-esophageal reflux disease with esophagitis, without bleeding: Secondary | ICD-10-CM

## 2016-07-07 DIAGNOSIS — I2510 Atherosclerotic heart disease of native coronary artery without angina pectoris: Secondary | ICD-10-CM | POA: Diagnosis not present

## 2016-07-07 DIAGNOSIS — E785 Hyperlipidemia, unspecified: Secondary | ICD-10-CM | POA: Diagnosis not present

## 2016-07-07 DIAGNOSIS — E559 Vitamin D deficiency, unspecified: Secondary | ICD-10-CM

## 2016-07-07 DIAGNOSIS — I10 Essential (primary) hypertension: Secondary | ICD-10-CM | POA: Diagnosis not present

## 2016-07-07 NOTE — Progress Notes (Signed)
Patient: Bryan Nelson Male    DOB: [birth year]   82 y.o.   MRN: [id number] Visit Date: 07/07/2016  Today's Provider: Lelon Huh, MD   Chief Complaint  Patient presents with  . Follow-up   Subjective:    HPI  Follow-up Medications Patient is here to discuss changing his medications from [place]. He has been getting all prescriptions from [place] but will be on Part D plan starting in January and he prefers to get medications from local pharmacy. He has follow up [place] in December and anticipates getting all of his labs at that time. Otherwise he feels well with no complaints today.   Last seen by Vernie Murders 05/07/2016 for Tremors and Hypokalemia. Labs ordered. Labs showed hemoglobin had dropped a little but otherwise normal. Will recheck labs for further imbalance. Ramipril was stopped and patient symptoms have since completely resolved.   Allergies  Allergen Reactions  . Ciprofloxacin Diarrhea  . Lisinopril Other (See Comments)  . Penicillins     unknwon reaction.  tolerates amoxicillin  . Sulfa Antibiotics Itching and Rash    Rash     Current Outpatient Prescriptions:  .  aspirin 81 MG tablet, Take 81 mg by mouth daily., Disp: , Rfl:  .  atorvastatin (LIPITOR) 10 MG tablet, Take 10 mg by mouth daily., Disp: , Rfl:  .  clopidogrel (PLAVIX) 75 MG tablet, Take 75 mg by mouth daily., Disp: , Rfl:  .  finasteride (PROSCAR) 5 MG tablet, Take 1 tablet (5 mg total) by mouth daily., Disp: 30 tablet, Rfl: 12 .  hydrochlorothiazide (HYDRODIURIL) 25 MG tablet, Take 25 mg by mouth daily., Disp: , Rfl:  .  isosorbide dinitrate (ISORDIL) 30 MG tablet, Take 30 mg by mouth every morning., Disp: , Rfl:  .  Methen-Hyosc-Meth Blue-Na Phos (UROGESIC-BLUE) 81.6 MG TABS, Take 1 tablet (81.6 mg total) by mouth 4 (four) times daily., Disp: 120 tablet, Rfl: 3 .  metoprolol (LOPRESSOR) 100 MG tablet, Take 100 mg by mouth 2 (two) times daily., Disp: , Rfl:  .  nitroGLYCERIN (NITROSTAT) 0.4 MG SL  tablet, Place 0.4 mg under the tongue every 5 (five) minutes as needed for chest pain. Reported on 03/10/2016, Disp: , Rfl:  .  omeprazole (PRILOSEC) 40 MG capsule, Take 40 mg by mouth 2 (two) times daily., Disp: , Rfl:  .  ranitidine (ZANTAC) 150 MG tablet, Take 150 mg by mouth 2 (two) times daily. Reported on 03/10/2016, Disp: , Rfl:  .  sucralfate (CARAFATE) 1 G tablet, Take 1 g by mouth 2 (two) times daily., Disp: , Rfl:  .  tamsulosin (FLOMAX) 0.4 MG CAPS capsule, Take 0.4 mg by mouth daily., Disp: , Rfl:  .  triamcinolone cream (KENALOG) 0.1 %, Apply 1 application topically 4 (four) times daily., Disp: 30 g, Rfl: 0  Current Facility-Administered Medications:  .  lidocaine (XYLOCAINE) 2 % jelly 1 application, 1 application, Urethral, Once, Collier Flowers, MD  Review of Systems  Constitutional: Negative for appetite change, chills and fever.  Respiratory: Negative for chest tightness, shortness of breath and wheezing.   Cardiovascular: Negative for chest pain and palpitations.  Gastrointestinal: Negative for abdominal pain, nausea and vomiting.    Social History  Substance Use Topics  . Smoking status: Former Smoker    Packs/day: 1.50    Years: 12.00    Types: Cigarettes    Quit date: 08/23/1981  . Smokeless tobacco: Never Used  . Alcohol use No  Objective:   BP 120/64 (BP Location: Right Arm, Patient Position: Sitting, Cuff Size: Large)   Pulse 60   Temp 98.4 F (36.9 C) (Oral)   Resp 16   Wt 254 lb (115.2 kg)   BMI 37.51 kg/m   Physical Exam  General appearance: alert, well developed, well nourished, cooperative and in no distress Head: Normocephalic, without obvious abnormality, atraumatic Respiratory: Respirations even and unlabored, normal respiratory rate Extremities: No gross deformities Skin: Skin color, texture, turgor normal. No rashes seen  Psych: Appropriate mood and affect. Neurologic: Mental status: Alert, oriented to person, place, and time, thought  content appropriate.      Assessment & Plan:     1. Essential hypertension Well controlled.  Continue current medications.    2. Arteriosclerosis of coronary artery Asymptomatic. Compliant with medication.  Continue aggressive risk factor modification.    3. Gastroesophageal reflux disease with esophagitis   4. Vitamin D deficiency   5. Hyperlipidemia, unspecified hyperlipidemia type  Will be getting labs at [facility] next month and will bring copy for our records. If stable will refill all medications after th new year.       Lelon Huh, MD  [facility]

## 2016-07-12 ENCOUNTER — Emergency Department
Admission: EM | Admit: 2016-07-12 | Discharge: 2016-07-12 | Disposition: A | Discharge status: Home / Self Care | Payer: Medicare Other | Attending: Emergency Medicine | Admitting: Emergency Medicine

## 2016-07-12 ENCOUNTER — Emergency Department: Payer: Medicare Other

## 2016-07-12 DIAGNOSIS — I2510 Atherosclerotic heart disease of native coronary artery without angina pectoris: Secondary | ICD-10-CM | POA: Insufficient documentation

## 2016-07-12 DIAGNOSIS — Z859 Personal history of malignant neoplasm, unspecified: Secondary | ICD-10-CM | POA: Insufficient documentation

## 2016-07-12 DIAGNOSIS — Y929 Unspecified place or not applicable: Secondary | ICD-10-CM | POA: Insufficient documentation

## 2016-07-12 DIAGNOSIS — S90121A Contusion of right lesser toe(s) without damage to nail, initial encounter: Secondary | ICD-10-CM

## 2016-07-12 DIAGNOSIS — Z7982 Long term (current) use of aspirin: Secondary | ICD-10-CM | POA: Diagnosis not present

## 2016-07-12 DIAGNOSIS — Y999 Unspecified external cause status: Secondary | ICD-10-CM | POA: Diagnosis not present

## 2016-07-12 DIAGNOSIS — Z79899 Other long term (current) drug therapy: Secondary | ICD-10-CM | POA: Insufficient documentation

## 2016-07-12 DIAGNOSIS — W228XXA Striking against or struck by other objects, initial encounter: Secondary | ICD-10-CM | POA: Diagnosis not present

## 2016-07-12 DIAGNOSIS — Z87891 Personal history of nicotine dependence: Secondary | ICD-10-CM | POA: Diagnosis not present

## 2016-07-12 DIAGNOSIS — Y939 Activity, unspecified: Secondary | ICD-10-CM | POA: Diagnosis not present

## 2016-07-12 DIAGNOSIS — S99921A Unspecified injury of right foot, initial encounter: Secondary | ICD-10-CM | POA: Diagnosis present

## 2016-07-12 MED ORDER — TRAMADOL HCL 50 MG PO TABS
50.0000 mg | ORAL_TABLET | Freq: Once | ORAL | Status: AC
Start: 2016-07-12 — End: 2016-07-12
  Administered 2016-07-12: 50 mg via ORAL
  Filled 2016-07-12: qty 1

## 2016-07-12 MED ORDER — TRAMADOL HCL 50 MG PO TABS: 50.0000 mg | ORAL_TABLET | Freq: Two times a day (BID) | ORAL | 0 refills | Status: DC | PRN

## 2016-07-12 NOTE — ED Notes (Signed)
Patient presents to the ED with pain to his pinky toe on his right foot after he stubbed his toe on a cedar chest this am.  Patient states pain has increased throughout the day.  Patient is able to ambulate on right foot.  Patient is in no obvious distress at this time.

## 2016-07-12 NOTE — ED Provider Notes (Signed)
[facility] Emergency Department Provider Note   ____________________________________________   First MD Initiated Contact with Patient 07/12/16 1451     (approximate)  I have reviewed the triage vital signs and the nursing notes.   HISTORY  Chief Complaint Toe Pain   HPI Bryan Nelson is a 82 y.o. male who presents to the emergency department for right fifth toe pain. He reports that he stubbed his toe on the corner of a cedar chest this morning and the pain has increasingly gotten worse throughout the day. The pain is a 4/10 and is associated with some bruising and swelling. He is able to ambulate, but with pain. He has not taken any medication prior to arrival.   Past Medical History:  Diagnosis Date  . Arteriosclerosis of coronary artery 08/26/2011   Overview:      a.  1999 PCI of the mid LAD with stent.      b.  2002 Cath [place]: EF 56%. RCA-distal 25/25%. Left main-50% ostial.  Left circumflex-25% OM2.  LAD-25% proximal.  75% mid.  25/25% distal. 75% D1.      c.  07/2011 PCI of RCA with DES.[place]   . Bleeding gastrointestinal 08/26/2011   Overview:      a.  Chronic abdominal pain, present improving.      b.  Duodenitis and gastritis by EGD in 2000.      c.  Pylori in 2000, treated.      d.  Gastroesophageal reflux disease.      e.  Diverticular disease.    . BP (high blood pressure) 08/26/2011  . Cancer ([place]) 2015   Lymphoma  . Heart disease   . Helicobacter pylori infection 06/18/2015   by EGD RX 08/18/1999   . History of adenomatous polyp of colon 06/18/2015    Patient Active Problem List   Diagnosis Date Noted  . Recurrent UTI 12/21/2015  . Urinary retention 09/24/2015  . Narrowing of intervertebral disc space 06/18/2015  . Diverticulitis 06/18/2015  . Esophageal reflux 06/18/2015  . Familial multiple lipoprotein-type hyperlipidemia 06/18/2015  . Insomnia 06/18/2015  . Vitamin D deficiency 06/18/2015  . Lymphoma ([place]) 04/10/2014  .  Benign prostatic hyperplasia with urinary obstruction 08/03/2012  . ED (erectile dysfunction) of organic origin 08/03/2012  . Breath shortness 12/09/2011  . Benign prostatic hypertrophy without urinary obstruction 08/26/2011  . Arteriosclerosis of coronary artery 08/26/2011  . Bleeding gastrointestinal 08/26/2011  . HLD (hyperlipidemia) 08/26/2011  . BP (high blood pressure) 08/26/2011  . Disorder of peripheral nervous system ([place]) 08/26/2011  . Enlarged prostate 08/26/2011  . Peripheral nerve disease ([place]) 08/26/2011  . Current tobacco use 08/26/2011    Past Surgical History:  Procedure Laterality Date  . ANGIOPLASTY    . CATARACT EXTRACTION    . GREEN LIGHT LASER TURP (TRANSURETHRAL RESECTION OF PROSTATE N/A 04/16/2015   Procedure: GREEN LIGHT LASER TURP (TRANSURETHRAL RESECTION OF PROSTATE WITH BLADDER BIOPSY;  Surgeon: Collier Flowers, MD;  Location: [facility] ORS;  Service: Urology;  Laterality: N/A;  . heart stent placement     1998, 2000, 2012    Prior to Admission medications   Medication Sig Start Date End Date Taking? Authorizing Provider  aspirin 81 MG tablet Take 81 mg by mouth daily.    Historical Provider, MD  atorvastatin (LIPITOR) 10 MG tablet Take 10 mg by mouth daily.    Historical Provider, MD  clopidogrel (PLAVIX) 75 MG tablet Take 75 mg by mouth daily.  Historical Provider, MD  finasteride (PROSCAR) 5 MG tablet Take 1 tablet (5 mg total) by mouth daily. 06/15/16   Larene Beach A McGowan, PA-C  hydrochlorothiazide (HYDRODIURIL) 25 MG tablet Take 25 mg by mouth daily.    Historical Provider, MD  isosorbide dinitrate (ISORDIL) 30 MG tablet Take 30 mg by mouth every morning.    Historical Provider, MD  Methen-Hyosc-Meth Blue-Na Phos (UROGESIC-BLUE) 81.6 MG TABS Take 1 tablet (81.6 mg total) by mouth 4 (four) times daily. 12/02/15   Cleon Gustin, MD  metoprolol (LOPRESSOR) 100 MG tablet Take 100 mg by mouth 2 (two) times daily.    Historical Provider, MD  nitroGLYCERIN  (NITROSTAT) 0.4 MG SL tablet Place 0.4 mg under the tongue every 5 (five) minutes as needed for chest pain. Reported on 03/10/2016    Historical Provider, MD  omeprazole (PRILOSEC) 40 MG capsule Take 40 mg by mouth 2 (two) times daily.    Historical Provider, MD  ranitidine (ZANTAC) 150 MG tablet Take 150 mg by mouth 2 (two) times daily. Reported on 03/10/2016    Historical Provider, MD  sucralfate (CARAFATE) 1 G tablet Take 1 g by mouth 2 (two) times daily.    Historical Provider, MD  tamsulosin (FLOMAX) 0.4 MG CAPS capsule Take 0.4 mg by mouth daily.    Historical Provider, MD  traMADol (ULTRAM) 50 MG tablet Take 1 tablet (50 mg total) by mouth every 12 (twelve) hours as needed. 07/12/16   Sable Feil, PA-C  triamcinolone cream (KENALOG) 0.1 % Apply 1 application topically 4 (four) times daily. 04/17/16   Charline Bills Cuthriell, PA-C    Allergies Ciprofloxacin; Lisinopril; Penicillins; and Sulfa antibiotics  Family History  Problem Relation Age of Onset  . Osteoporosis Mother   . Alzheimer's disease Father   . Kidney disease Neg Hx   . Prostate cancer Neg Hx     Social History Social History  Substance Use Topics  . Smoking status: Former Smoker    Packs/day: 1.50    Years: 12.00    Types: Cigarettes    Quit date: 08/23/1981  . Smokeless tobacco: Never Used  . Alcohol use No    Review of Systems Cardiovascular: Denies chest pain. Respiratory: Denies shortness of breath Musculoskeletal: Positive for pain of right little toe Skin: Positive for swelling and bruising of right little toe Neurological: Negative for headaches, focal weakness or numbness. No numbness, tingling or loss of sensation in the right lower extremity.   10-point ROS otherwise negative.  ____________________________________________   PHYSICAL EXAM:  VITAL SIGNS: ED Triage Vitals  Enc Vitals Group     BP 07/12/16 1423 134/63     Pulse Rate 07/12/16 1423 69     Resp 07/12/16 1423 18     Temp 07/12/16  1423 97.5 F (36.4 C)     Temp Source 07/12/16 1423 Oral     SpO2 07/12/16 1423 96 %     Weight 07/12/16 1424 240 lb (108.9 kg)     Height 07/12/16 1424 6\' 1"  (1.854 m)     Head Circumference --      Peak Flow --      Pain Score 07/12/16 1424 8     Pain Loc --      Pain Edu? --      Excl. in [place]? --     Constitutional: Alert and oriented. Well appearing and in no acute distress. Eyes: Conjunctivae are normal.  Head: Atraumatic. Cardiovascular: Normal rate, regular rhythm. Grossly normal heart  sounds.  Good peripheral circulation with 2+ distal pulses. Capillary refill brisk in all digits of the right lower extremity.  Respiratory: Normal respiratory effort.  No retractions. Lungs CTAB. Musculoskeletal: Tenderness to palpation of the right fifth toe. No tenderness on palpation of first four toes or metatarsals. Full range of motion of the right lower extremity.  Neurologic:  Normal speech and language. No gross focal neurologic deficits are appreciated. No gait instability. Skin:  Skin is warm, dry and intact. Circumfrential ecchymosis of the right fifth toe. No open wounds, laceration, or skin ulceration noted.  Psychiatric: Mood and affect are normal. Speech and behavior are normal.  ____________________________________________   LABS  None ____________________________________________  EKG  None ____________________________________________  RADIOLOGY  FINDINGS: Diffuse degenerative change. No evidence of fracture dislocation. No acute bony abnormality identified.  IMPRESSION: Diffuse degenerative change.  No acute abnormality .  ____________________________________________   PROCEDURES  Procedure(s) performed: None  Procedures  Critical Care performed: No  ____________________________________________   INITIAL IMPRESSION / ASSESSMENT AND PLAN / ED COURSE  Pertinent labs & imaging results that were available during my care of the patient were reviewed by  me and considered in my medical decision making (see chart for details).  Patient's diagnosis is consistent with contusion of fifth toe of the right foot. Patient was discharged home with a prescription for tramadol to take as prescribed for pain. He was placed in an open shoe splint in the ED for comfort care and given instructions to ice the area as needed. He is to follow up with his primary care provider as needed. Patient was given instructions to return to the ED for any new or worsening symptoms.   Clinical Course      ____________________________________________   FINAL CLINICAL IMPRESSION(S) / ED DIAGNOSES  Final diagnoses:  Contusion of fifth toe of right foot, initial encounter      NEW MEDICATIONS STARTED DURING THIS VISIT:  New Prescriptions   TRAMADOL (ULTRAM) 50 MG TABLET    Take 1 tablet (50 mg total) by mouth every 12 (twelve) hours as needed.     Note:  This document was prepared using Dragon voice recognition software and may include unintentional dictation errors.   Sable Feil, PA-C 07/12/16 [place], MD 07/12/16 [phone]

## 2016-07-12 NOTE — Discharge Instructions (Signed)
Please take prescribed medication as needed for pain. Wear walking shoe for comfort. Ice the toe as needed.

## 2016-07-12 NOTE — ED Triage Notes (Signed)
Pt states he stubbed his right 5th toe this morning and thinks he broke it.

## 2016-07-21 ENCOUNTER — Telehealth: Payer: Self-pay | Admitting: Urology

## 2016-07-21 NOTE — Telephone Encounter (Signed)
New order has been faxed

## 2016-07-21 NOTE — Telephone Encounter (Signed)
Pt called stating his cath Rx was changed at last appt, however the Rx to 180 does not reflect the change. Needs new Rx from using 3 a day to 4 a day. Please advise

## 2016-07-22 ENCOUNTER — Other Ambulatory Visit: Payer: Self-pay | Admitting: Urology

## 2016-07-22 DIAGNOSIS — R300 Dysuria: Secondary | ICD-10-CM

## 2016-08-25 ENCOUNTER — Ambulatory Visit
Admission: RE | Admit: 2016-08-25 | Discharge: 2016-08-25 | Disposition: A | Discharge status: Home / Self Care | Payer: PPO | Source: Ambulatory Visit | Attending: Family Medicine | Admitting: Family Medicine

## 2016-08-25 ENCOUNTER — Ambulatory Visit (INDEPENDENT_AMBULATORY_CARE_PROVIDER_SITE_OTHER): Payer: PRIVATE HEALTH INSURANCE | Admitting: Family Medicine

## 2016-08-25 ENCOUNTER — Encounter: Payer: Self-pay | Admitting: Family Medicine

## 2016-08-25 VITALS — BP 120/76 | HR 68 | Temp 97.9°F | Resp 16 | Wt 256.0 lb

## 2016-08-25 DIAGNOSIS — M4802 Spinal stenosis, cervical region: Secondary | ICD-10-CM | POA: Insufficient documentation

## 2016-08-25 DIAGNOSIS — M5412 Radiculopathy, cervical region: Secondary | ICD-10-CM

## 2016-08-25 DIAGNOSIS — M542 Cervicalgia: Secondary | ICD-10-CM | POA: Diagnosis not present

## 2016-08-25 DIAGNOSIS — M5030 Other cervical disc degeneration, unspecified cervical region: Secondary | ICD-10-CM | POA: Insufficient documentation

## 2016-08-25 MED ORDER — PREDNISONE 10 MG PO TABS: ORAL_TABLET | ORAL | 0 refills | Status: AC

## 2016-08-25 NOTE — Patient Instructions (Signed)
Go to the [facility] on [facility] for c-spine [employer]

## 2016-08-25 NOTE — Progress Notes (Signed)
Patient: Bryan Nelson Male    DOB: [birth year]   83 y.o.   MRN: [id number] Visit Date: 08/25/2016  Today's Provider: Lelon Huh, MD   Chief Complaint  Patient presents with  . Neck Pain   Subjective:    Neck Pain   This is a recurrent problem. The current episode started more than 1 year ago. The problem occurs intermittently. The problem has been unchanged. Associated symptoms include numbness and tingling. Pertinent negatives include no chest pain, fever, headaches, leg pain, syncope, visual change or weakness.  Patient states recently he has been having pain in his right hand and numbness and tingling in his left arm which is new. No pain in his left arm. No chest pain or pressure. No dyspnea. No weakness in hands or arms. No pain in   Last MRI-Cspine 12/10/2011 IMPRESSION:      Multilevel degenerative change with multilevel annular bulge. Degenerative end-plate osteophyte formation is present. No high-grade spinal stenosis. The thecal sac measures approximately 9 mm in diameter.      Allergies  Allergen Reactions  . Ciprofloxacin Diarrhea  . Lisinopril Other (See Comments)  . Penicillins     unknwon reaction.  tolerates amoxicillin  . Sulfa Antibiotics Itching and Rash    Rash     Current Outpatient Prescriptions:  .  aspirin 81 MG tablet, Take 81 mg by mouth daily., Disp: , Rfl:  .  atorvastatin (LIPITOR) 10 MG tablet, Take 10 mg by mouth daily., Disp: , Rfl:  .  clopidogrel (PLAVIX) 75 MG tablet, Take 75 mg by mouth daily., Disp: , Rfl:  .  finasteride (PROSCAR) 5 MG tablet, Take 1 tablet (5 mg total) by mouth daily., Disp: 30 tablet, Rfl: 12 .  hydrochlorothiazide (HYDRODIURIL) 25 MG tablet, Take 25 mg by mouth daily., Disp: , Rfl:  .  isosorbide dinitrate (ISORDIL) 30 MG tablet, Take 30 mg by mouth every morning., Disp: , Rfl:  .  Methen-Hyosc-Meth Blue-Na Phos (UROGESIC-BLUE) 81.6 MG TABS, take 1 tablet by mouth four times a day, Disp: 120 tablet, Rfl: 3 .   metoprolol (LOPRESSOR) 100 MG tablet, Take 100 mg by mouth 2 (two) times daily., Disp: , Rfl:  .  nitroGLYCERIN (NITROSTAT) 0.4 MG SL tablet, Place 0.4 mg under the tongue every 5 (five) minutes as needed for chest pain. Reported on 03/10/2016, Disp: , Rfl:  .  omeprazole (PRILOSEC) 40 MG capsule, Take 40 mg by mouth 2 (two) times daily., Disp: , Rfl:  .  ranitidine (ZANTAC) 150 MG tablet, Take 150 mg by mouth 2 (two) times daily. Reported on 03/10/2016, Disp: , Rfl:  .  sucralfate (CARAFATE) 1 G tablet, Take 1 g by mouth 2 (two) times daily., Disp: , Rfl:  .  tamsulosin (FLOMAX) 0.4 MG CAPS capsule, Take 0.4 mg by mouth daily., Disp: , Rfl:  .  traMADol (ULTRAM) 50 MG tablet, Take 1 tablet (50 mg total) by mouth every 12 (twelve) hours as needed., Disp: 12 tablet, Rfl: 0 .  triamcinolone cream (KENALOG) 0.1 %, Apply 1 application topically 4 (four) times daily., Disp: 30 g, Rfl: 0  Current Facility-Administered Medications:  .  lidocaine (XYLOCAINE) 2 % jelly 1 application, 1 application, Urethral, Once, Collier Flowers, MD  Review of Systems  Constitutional: Negative for appetite change, chills and fever.  Respiratory: Negative for chest tightness, shortness of breath and wheezing.   Cardiovascular: Negative for chest pain, palpitations and syncope.  Gastrointestinal: Negative for abdominal  pain, nausea and vomiting.  Musculoskeletal: Positive for myalgias (left arm and hands) and neck pain.  Skin:       Itchy dry skin  Neurological: Positive for dizziness, tingling, light-headedness and numbness. Negative for weakness and headaches.    Social History  Substance Use Topics  . Smoking status: Former Smoker    Packs/day: 1.50    Years: 12.00    Types: Cigarettes    Quit date: 08/23/1981  . Smokeless tobacco: Never Used  . Alcohol use No   Objective:   BP 120/76 (BP Location: Left Arm, Patient Position: Sitting, Cuff Size: Large)   Pulse 68   Temp 97.9 F (36.6 C) (Oral)   Resp 16    Wt 256 lb (116.1 kg)   BMI 33.78 kg/m   Physical Exam   General Appearance:    Alert, cooperative, no distress  Eyes:    PERRL, conjunctiva/corneas clear, EOM's intact       Lungs:     Clear to auscultation bilaterally, respirations unlabored  Heart:    Regular rate and rhythm  Neurologic:   Awake, alert, oriented x 3. No apparent focal neurological           defect. +5 muscle strength both ands and arms. FROM of neck. Mild tenderness along c-spine.       Dg Cervical Spine Complete  Result Date: 08/25/2016 CLINICAL DATA:  Chronic right neck pain and left arm numbness.  EXAM: CERVICAL SPINE - COMPLETE 4+ VIEW COMPARISON:  Radiographs of June 16, 2010. FINDINGS: No fracture or spondylolisthesis is noted. Mild degenerative disc disease is noted at C5-6 with anterior and posterior osteophyte formation. Mild degenerative disc disease is also noted at C3-4 with anterior osteophyte formation. No prevertebral soft tissue swelling is noted. Mild left-sided neural foraminal stenosis is noted at C5-6 and C6-7 secondary to uncovertebral spurring. Mild right-sided neural foraminal stenosis is noted at C5-6 secondary to uncovertebral spurring.  IMPRESSION: Mild multilevel degenerative disc disease. Mild bilateral neural foraminal stenosis as described above. No acute abnormality seen in the cervical spine. Electronically Signed   By: Marijo Conception, M.D.   On: 08/25/2016 11:51       Assessment & Plan:     1. Cervical radiculopathy  - DG Cervical Spine Complete; Future - predniSONE (DELTASONE) 10 MG tablet; 6 tablets for 2 days, then 5 for 2 days, then 4 for 2 days, then 3 for 2 days, then 2 for 2 days, then 1 for 2 days.  Dispense: 42 tablet; Refill: 0       Lelon Huh, MD  [facility]

## 2016-08-26 ENCOUNTER — Other Ambulatory Visit: Payer: Self-pay | Admitting: Family Medicine

## 2016-08-26 MED ORDER — ATORVASTATIN CALCIUM 10 MG PO TABS: 10.0000 mg | ORAL_TABLET | Freq: Every day | ORAL | 4 refills | Status: DC

## 2016-08-26 MED ORDER — METOPROLOL TARTRATE 100 MG PO TABS: 100.0000 mg | ORAL_TABLET | Freq: Two times a day (BID) | ORAL | 4 refills | Status: DC

## 2016-08-26 MED ORDER — OMEPRAZOLE 40 MG PO CPDR: 40.0000 mg | DELAYED_RELEASE_CAPSULE | Freq: Two times a day (BID) | ORAL | 4 refills | Status: DC

## 2016-08-26 MED ORDER — HYDROCHLOROTHIAZIDE 25 MG PO TABS: 25.0000 mg | ORAL_TABLET | Freq: Every day | ORAL | 4 refills | Status: DC

## 2016-08-26 MED ORDER — RANITIDINE HCL 150 MG PO TABS: 150.0000 mg | ORAL_TABLET | Freq: Two times a day (BID) | ORAL | 4 refills | Status: DC

## 2016-08-26 MED ORDER — ISOSORBIDE DINITRATE 30 MG PO TABS: 30.0000 mg | ORAL_TABLET | ORAL | 4 refills | Status: DC

## 2016-08-26 MED ORDER — CLOPIDOGREL BISULFATE 75 MG PO TABS
75.0000 mg | ORAL_TABLET | Freq: Every day | ORAL | 4 refills | Status: DC
Start: 2016-08-26 — End: 2017-11-14

## 2016-09-10 ENCOUNTER — Other Ambulatory Visit: Payer: Self-pay | Admitting: *Deleted

## 2016-09-10 DIAGNOSIS — S82899A Other fracture of unspecified lower leg, initial encounter for closed fracture: Secondary | ICD-10-CM

## 2016-09-10 DIAGNOSIS — S9304XA Dislocation of right ankle joint, initial encounter: Secondary | ICD-10-CM

## 2016-09-10 DIAGNOSIS — C8590 Non-Hodgkin lymphoma, unspecified, unspecified site: Secondary | ICD-10-CM

## 2016-09-10 HISTORY — DX: Dislocation of right ankle joint, initial encounter: S93.04XA

## 2016-09-10 HISTORY — DX: Other fracture of unspecified lower leg, initial encounter for closed fracture: S82.899A

## 2016-09-11 ENCOUNTER — Encounter: Payer: Self-pay | Admitting: Emergency Medicine

## 2016-09-11 ENCOUNTER — Emergency Department: Payer: PPO

## 2016-09-11 ENCOUNTER — Emergency Department
Admission: EM | Admit: 2016-09-11 | Discharge: 2016-09-11 | Disposition: A | Discharge status: Home / Self Care | Payer: PPO | Attending: Emergency Medicine | Admitting: Emergency Medicine

## 2016-09-11 DIAGNOSIS — S8254XA Nondisplaced fracture of medial malleolus of right tibia, initial encounter for closed fracture: Secondary | ICD-10-CM | POA: Diagnosis not present

## 2016-09-11 DIAGNOSIS — S99911A Unspecified injury of right ankle, initial encounter: Secondary | ICD-10-CM | POA: Diagnosis not present

## 2016-09-11 DIAGNOSIS — S9304XA Dislocation of right ankle joint, initial encounter: Secondary | ICD-10-CM | POA: Insufficient documentation

## 2016-09-11 DIAGNOSIS — Z9889 Other specified postprocedural states: Secondary | ICD-10-CM

## 2016-09-11 DIAGNOSIS — S8251XA Displaced fracture of medial malleolus of right tibia, initial encounter for closed fracture: Secondary | ICD-10-CM | POA: Diagnosis not present

## 2016-09-11 DIAGNOSIS — Z7982 Long term (current) use of aspirin: Secondary | ICD-10-CM | POA: Insufficient documentation

## 2016-09-11 DIAGNOSIS — Y999 Unspecified external cause status: Secondary | ICD-10-CM | POA: Insufficient documentation

## 2016-09-11 DIAGNOSIS — W001XXA Fall from stairs and steps due to ice and snow, initial encounter: Secondary | ICD-10-CM | POA: Diagnosis not present

## 2016-09-11 DIAGNOSIS — Z87891 Personal history of nicotine dependence: Secondary | ICD-10-CM | POA: Diagnosis not present

## 2016-09-11 DIAGNOSIS — S0003XA Contusion of scalp, initial encounter: Secondary | ICD-10-CM | POA: Diagnosis not present

## 2016-09-11 DIAGNOSIS — Y929 Unspecified place or not applicable: Secondary | ICD-10-CM | POA: Insufficient documentation

## 2016-09-11 DIAGNOSIS — S9306XA Dislocation of unspecified ankle joint, initial encounter: Secondary | ICD-10-CM | POA: Diagnosis not present

## 2016-09-11 DIAGNOSIS — S92251A Displaced fracture of navicular [scaphoid] of right foot, initial encounter for closed fracture: Secondary | ICD-10-CM | POA: Diagnosis not present

## 2016-09-11 DIAGNOSIS — Y9389 Activity, other specified: Secondary | ICD-10-CM | POA: Insufficient documentation

## 2016-09-11 DIAGNOSIS — Z79899 Other long term (current) drug therapy: Secondary | ICD-10-CM | POA: Diagnosis not present

## 2016-09-11 DIAGNOSIS — S82891A Other fracture of right lower leg, initial encounter for closed fracture: Secondary | ICD-10-CM

## 2016-09-11 DIAGNOSIS — S92254A Nondisplaced fracture of navicular [scaphoid] of right foot, initial encounter for closed fracture: Secondary | ICD-10-CM

## 2016-09-11 DIAGNOSIS — I2510 Atherosclerotic heart disease of native coronary artery without angina pectoris: Secondary | ICD-10-CM | POA: Diagnosis not present

## 2016-09-11 DIAGNOSIS — I639 Cerebral infarction, unspecified: Secondary | ICD-10-CM | POA: Insufficient documentation

## 2016-09-11 DIAGNOSIS — IMO0001 Reserved for inherently not codable concepts without codable children: Secondary | ICD-10-CM

## 2016-09-11 MED ORDER — TETANUS-DIPHTH-ACELL PERTUSSIS 5-2.5-18.5 LF-MCG/0.5 IM SUSP
0.5000 mL | Freq: Once | INTRAMUSCULAR | Status: AC
  Administered 2016-09-11: 0.5 mL via INTRAMUSCULAR
  Filled 2016-09-11: qty 0.5

## 2016-09-11 MED ORDER — MIDAZOLAM HCL 5 MG/5ML IJ SOLN
INTRAMUSCULAR | Status: AC
  Administered 2016-09-11: 2 mg via INTRAVENOUS
  Filled 2016-09-11: qty 5

## 2016-09-11 MED ORDER — MORPHINE SULFATE (PF) 4 MG/ML IV SOLN
4.0000 mg | Freq: Once | INTRAVENOUS | Status: AC
  Administered 2016-09-11: 4 mg via INTRAVENOUS
  Filled 2016-09-11: qty 1

## 2016-09-11 MED ORDER — OXYCODONE-ACETAMINOPHEN 5-325 MG PO TABS: 1.0000 | ORAL_TABLET | Freq: Four times a day (QID) | ORAL | 0 refills | Status: DC | PRN

## 2016-09-11 MED ORDER — FLUMAZENIL 0.5 MG/5ML IV SOLN
INTRAVENOUS | Status: AC
  Filled 2016-09-11: qty 5

## 2016-09-11 MED ORDER — ONDANSETRON HCL 4 MG/2ML IJ SOLN
4.0000 mg | Freq: Once | INTRAMUSCULAR | Status: AC
  Administered 2016-09-11: 4 mg via INTRAVENOUS
  Filled 2016-09-11: qty 2

## 2016-09-11 MED ORDER — SODIUM CHLORIDE 0.9 % IV SOLN
Freq: Once | INTRAVENOUS | Status: AC
  Administered 2016-09-11: 17:00:00 via INTRAVENOUS

## 2016-09-11 MED ORDER — MIDAZOLAM HCL 2 MG/2ML IJ SOLN
2.0000 mg | Freq: Once | INTRAMUSCULAR | Status: AC
  Administered 2016-09-11: 2 mg via INTRAVENOUS

## 2016-09-11 NOTE — Sedation Documentation (Signed)
R ankle reduced by EDP. Pt tolerated well. Splint being applied by EDT.

## 2016-09-11 NOTE — ED Notes (Signed)
Pt and spouse verbalized understanding of post-procedure discharge instructions, pt unable to sign d/t carbon copy not available at this time. Informed pt and wife that pt must not: drive for 24 hours, make life-altering decisions for 24 hours, must change positions slowly, may experience side effects from sedation medications. Standard return precautions reviewed with pt and spouse.

## 2016-09-11 NOTE — ED Notes (Signed)
Located carbon copy of moderate sedation discharge instructions for pt siganture. Reviewed again with patient and spouse prior to start of procedure. Pt given copy of instructions to take home.

## 2016-09-11 NOTE — ED Provider Notes (Signed)
[facility] Emergency Department Provider Note  ____________________________________________   First MD Initiated Contact with Patient 09/11/16 1643     (approximate)  I have reviewed the triage vital signs and the nursing notes.   HISTORY  Chief Complaint Fall and Leg Injury   HPI Bryan Nelson is a 83 y.o. male with a history of high blood pressure and CAD on Plavix who is presenting to the emergency department todayafter a fall on ice. He says that he had the back of his head but also his right ankle. The majority of his pain in his right ankle where he has a noticeable deformity. He was brought in by EMS. The patient says that he has a moderate to severe degree of pain. Says that he is able to feel his right foot. Does not know the date of his last tetanus shot. Says that he last ate at 12 PM today.   Past Medical History:  Diagnosis Date  . Arteriosclerosis of coronary artery 08/26/2011   Overview:      a.  1999 PCI of the mid LAD with stent.      b.  2002 Cath [place]: EF 56%. RCA-distal 25/25%. Left main-50% ostial.  Left circumflex-25% OM2.  LAD-25% proximal.  75% mid.  25/25% distal. 75% D1.      c.  07/2011 PCI of RCA with DES.[place]   . Bleeding gastrointestinal 08/26/2011   Overview:      a.  Chronic abdominal pain, present improving.      b.  Duodenitis and gastritis by EGD in 2000.      c.  Pylori in 2000, treated.      d.  Gastroesophageal reflux disease.      e.  Diverticular disease.    . BP (high blood pressure) 08/26/2011  . Cancer ([place]) 2015   Lymphoma  . Heart disease   . Helicobacter pylori infection 06/18/2015   by EGD RX 08/18/1999   . History of adenomatous polyp of colon 06/18/2015    Patient Active Problem List   Diagnosis Date Noted  . Recurrent UTI 12/21/2015  . Urinary retention 09/24/2015  . Narrowing of intervertebral disc space 06/18/2015  . Diverticulitis 06/18/2015  . Esophageal reflux 06/18/2015  . Familial multiple  lipoprotein-type hyperlipidemia 06/18/2015  . Insomnia 06/18/2015  . Vitamin D deficiency 06/18/2015  . Lymphoma ([place]) 04/10/2014  . Benign prostatic hyperplasia with urinary obstruction 08/03/2012  . ED (erectile dysfunction) of organic origin 08/03/2012  . Breath shortness 12/09/2011  . Benign prostatic hypertrophy without urinary obstruction 08/26/2011  . Arteriosclerosis of coronary artery 08/26/2011  . Bleeding gastrointestinal 08/26/2011  . HLD (hyperlipidemia) 08/26/2011  . BP (high blood pressure) 08/26/2011  . Disorder of peripheral nervous system ([place]) 08/26/2011  . Enlarged prostate 08/26/2011  . Peripheral nerve disease ([place]) 08/26/2011  . Current tobacco use 08/26/2011    Past Surgical History:  Procedure Laterality Date  . ANGIOPLASTY    . CATARACT EXTRACTION    . GREEN LIGHT LASER TURP (TRANSURETHRAL RESECTION OF PROSTATE N/A 04/16/2015   Procedure: GREEN LIGHT LASER TURP (TRANSURETHRAL RESECTION OF PROSTATE WITH BLADDER BIOPSY;  Surgeon: Collier Flowers, MD;  Location: [facility] ORS;  Service: Urology;  Laterality: N/A;  . heart stent placement     1998, 2000, 2012    Prior to Admission medications   Medication Sig Start Date End Date Taking? Authorizing Provider  aspirin 81 MG tablet Take 81 mg by mouth daily.    Historical  Provider, MD  atorvastatin (LIPITOR) 10 MG tablet Take 1 tablet (10 mg total) by mouth daily. 08/26/16   [name], MD  clopidogrel (PLAVIX) 75 MG tablet Take 1 tablet (75 mg total) by mouth daily. 08/26/16   [name], MD  finasteride (PROSCAR) 5 MG tablet Take 1 tablet (5 mg total) by mouth daily. 06/15/16   Larene Beach A McGowan, PA-C  hydrochlorothiazide (HYDRODIURIL) 25 MG tablet Take 1 tablet (25 mg total) by mouth daily. 08/26/16   [name], MD  isosorbide dinitrate (ISORDIL) 30 MG tablet Take 1 tablet (30 mg total) by mouth every morning. 08/26/16   [name], MD  Methen-Hyosc-Meth Blue-Na Phos (UROGESIC-BLUE) 81.6 MG TABS take 1  tablet by mouth four times a day 07/22/16   Larene Beach A McGowan, PA-C  metoprolol (LOPRESSOR) 100 MG tablet Take 1 tablet (100 mg total) by mouth 2 (two) times daily. 08/26/16   [name], MD  nitroGLYCERIN (NITROSTAT) 0.4 MG SL tablet Place 0.4 mg under the tongue every 5 (five) minutes as needed for chest pain. Reported on 03/10/2016    Historical Provider, MD  omeprazole (PRILOSEC) 40 MG capsule Take 1 capsule (40 mg total) by mouth 2 (two) times daily. 08/26/16   [name], MD  ranitidine (ZANTAC) 150 MG tablet Take 1 tablet (150 mg total) by mouth 2 (two) times daily. Reported on 03/10/2016 08/26/16   [name], MD  sucralfate (CARAFATE) 1 G tablet Take 1 g by mouth 2 (two) times daily.    Historical Provider, MD  tamsulosin (FLOMAX) 0.4 MG CAPS capsule Take 0.4 mg by mouth daily.    Historical Provider, MD  traMADol (ULTRAM) 50 MG tablet Take 1 tablet (50 mg total) by mouth every 12 (twelve) hours as needed. 07/12/16   Sable Feil, PA-C  triamcinolone cream (KENALOG) 0.1 % Apply 1 application topically 4 (four) times daily. 04/17/16   Charline Bills Cuthriell, PA-C    Allergies Ciprofloxacin; Lisinopril; Penicillins; and Sulfa antibiotics  Family History  Problem Relation Age of Onset  . Osteoporosis Mother   . Alzheimer's disease Father   . Kidney disease Neg Hx   . Prostate cancer Neg Hx     Social History Social History  Substance Use Topics  . Smoking status: Former Smoker    Packs/day: 1.50    Years: 12.00    Types: Cigarettes    Quit date: 08/23/1981  . Smokeless tobacco: Never Used  . Alcohol use No    Review of Systems Constitutional: No fever/chills Eyes: No visual changes. ENT: No sore throat. Cardiovascular: Denies chest pain. Respiratory: Denies shortness of breath. Gastrointestinal: No abdominal pain.  No nausea, no vomiting.  No diarrhea.  No constipation. Genitourinary: Negative for dysuria. Musculoskeletal: Negative for back pain. Skin: Negative  for rash. Neurological: Negative for headaches, focal weakness or numbness.  10-point ROS otherwise negative.  ____________________________________________   PHYSICAL EXAM:  VITAL SIGNS: ED Triage Vitals  Enc Vitals Group     BP 09/11/16 1644 137/64     Pulse Rate 09/11/16 1645 75     Resp 09/11/16 1649 20     Temp 09/11/16 1649 99 F (37.2 C)     Temp Source 09/11/16 1649 Oral     SpO2 09/11/16 1645 97 %     Weight 09/11/16 1652 240 lb (108.9 kg)     Height 09/11/16 1652 6\' 2"  (1.88 m)     Head Circumference --  Peak Flow --      Pain Score 09/11/16 1653 9     Pain Loc --      Pain Edu? --      Excl. in [place]? --     Constitutional: Alert and oriented. Well appearing and in no acute distress. Eyes: Conjunctivae are normal. PERRL. EOMI. Head: Atraumatic. Nose: No congestion/rhinnorhea. Mouth/Throat: Mucous membranes are moist.  Neck: No stridor.  No tenderness palpation of midline cervical spine. Cardiovascular: Normal rate, regular rhythm. Grossly normal heart sounds.  Good peripheral circulation. Respiratory: Normal respiratory effort.  No retractions. Lungs CTAB. Gastrointestinal: Soft and nontender. No distention.  Musculoskeletal: Deformity to the right ankle with bony protrusion, medially. The patient has bilateral and intact dorsalis pedis pulses. Sensation is intact to light touch. He is able to range his toes freely and without restriction, bilaterally. Neurologic:  Normal speech and language. No gross focal neurologic deficits are appreciated.  Skin:  Skin is warm, dry and intact. No rash noted. Psychiatric: Mood and affect are normal. Speech and behavior are normal.  ____________________________________________   LABS (all labs ordered are listed, but only abnormal results are displayed)  Labs Reviewed - No data to display ____________________________________________  EKG   ____________________________________________  RADIOLOGY  DG Ankle 2 Views  Right (Final result)  Result time 09/11/16 17:52:32  Procedure changed from [facility] Ankle Complete Right  Final result by Earle Gell, MD (09/11/16 17:52:32)           Narrative:   CLINICAL DATA: [place] down steps today. Right ankle pain and deformity. Initial encounter.  EXAM: RIGHT ANKLE - 2 VIEW  COMPARISON: None.  FINDINGS: Fracture through the inferior tip of the medial malleolus is seen which is displaced laterally. Posterior dislocation of the talus is seen. There is widening of the ankle mortise, consistent with intraosseous ligament teara.  IMPRESSION: Medial malleolar fracture, with posterior talar dislocation. Widening of ankle mortise, consistent with interosseous ligament tear.   Electronically Signed By: Earle Gell M.D. On: 09/11/2016 17:52            CT Head Wo Contrast (Final result)  Result time 09/11/16 17:48:54  Final result by Gus Height, MD (09/11/16 17:48:54)           Narrative:   CLINICAL DATA: Pt states he fell down the back steps while trying to help movers move a couch. Pt denies LOC. Pt denies hx of stroke, but states hx of lymphoma.  EXAM: CT HEAD WITHOUT CONTRAST  TECHNIQUE: Contiguous axial images were obtained from the base of the skull through the vertex without intravenous contrast.  COMPARISON: None.  FINDINGS: Brain: No intracranial hemorrhage. No parenchymal contusion. No midline shift or mass effect. Basilar cisterns are patent. No skull base fracture. No fluid in the paranasal sinuses or mastoid air cells. Orbits are normal.  Vascular: No hyperdense vessel or unexpected calcification.  Skull: Normal. Negative for fracture or focal lesion.  Sinuses/Orbits: Paranasal sinuses and mastoid air cells are clear. Orbits are clear.  Other: Very small scalp hematoma over the occipital bone.  IMPRESSION: 1. Very small posterior scalp hematoma. 2. No fracture. 3. No intracranial trauma.   Electronically  Signed By: Suzy Bouchard M.D. On: 09/11/2016 17:48          ____________________________________________   PROCEDURES  Procedure(s) performed:   Reduction of dislocation Date/Time: 6:50 PM Performed by: Doran Stabler Authorized by: Doran Stabler Consent: Verbal consent obtained. Risks and benefits: risks, benefits and alternatives were  discussed Consent given by: patient Required items: required blood products, implants, devices, and special equipment available Time out: Immediately prior to procedure a "time out" was called to verify the correct patient, procedure, equipment, support staff and site/side marked as required.  Patient sedated: With Versed  Vitals: Vital signs were monitored during sedation. Patient tolerance: Patient tolerated the procedure well with no immediate complications. Joint: right foot/ankle Reduction technique: gentle axial as well as anterior retraction with successful reduction.    Neurovascularly intact status post reduction.   Procedural sedation Performed by: Doran Stabler Consent: Verbal consent obtained. Risks and benefits: risks, benefits and alternatives were discussed Required items: required blood products, implants, devices, and special equipment available Patient identity confirmed: arm band and provided demographic data Time out: Immediately prior to procedure a "time out" was called to verify the correct patient, procedure, equipment, support staff and site/side marked as required.  Sedation type: moderate (conscious) sedation NPO time confirmed and considedered  Sedatives: [place]   Physician Time at Bedside: 38min  Vitals: Vital signs were monitored during sedation. Cardiac Monitor, pulse oximeter Patient tolerance: Patient tolerated the procedure well with no immediate complications. Comments: Pt with uneventful recovered. Returned to pre-procedural sedation  baseline     Procedures  Critical Care performed:   ____________________________________________   INITIAL IMPRESSION / ASSESSMENT AND PLAN / ED COURSE  Pertinent labs & imaging results that were available during my care of the patient were reviewed by me and considered in my medical decision making (see chart for details).    Clinical Course as of Sep 11 1948  Sat Sep 11, 2016  1924 DG Ankle Complete Right [DS]    Clinical Course User Index [DS] Orbie Pyo, MD   ----------------------------------------- 7:52 PM on 09/11/2016 -----------------------------------------  Successful reduction of the right ankle. Discussed the case with Dr. Daine Gip of orthopedics who recommends outpatient follow-up. Patient was placed in a posterior as well as U-splint and will be discharged with crutches and Percocet. He is understanding of the diagnosis and is willing to comply with the plan. We will keep his splint on. He will not be weight-bearing until advised otherwise by the orthopedist.  ____________________________________________   FINAL CLINICAL IMPRESSION(S) / ED DIAGNOSES  Right ankle dislocation. Procedural sedation. Navicular fracture. Medial malleolus fracture.    NEW MEDICATIONS STARTED DURING THIS VISIT:  New Prescriptions   No medications on file     Note:  This document was prepared using Dragon voice recognition software and may include unintentional dictation errors.    Orbie Pyo, MD 09/11/16 [phone]

## 2016-09-11 NOTE — ED Triage Notes (Addendum)
Pt presents to ED via [place] RLE fracture s/p mechanical fall. Obvious deformity noted to RLE. Pt states he tripped going down the steps.

## 2016-09-11 NOTE — ED Notes (Signed)
Pt A&Ox4, talking to family at bedside. O2 sat 98% on room air. Pt denies pain at this time.

## 2016-09-12 NOTE — Progress Notes (Signed)
Bryan Nelson  [phone]  [phone]     [name] DOB: 07/09/1932  MR#: [id number]  XNA#:[id number]  Patient Care Team: [name], MD as PCP - General (Family Medicine) Forest Gleason, MD (Unknown Physician Specialty) Christene Lye, MD (General Surgery) Collier Flowers, MD (Urology)  CHIEF COMPLAINT: Diffuse large B-cell lymphoma.  INTERVAL HISTORY: Patient returns to clinic today for routine follow-up of his lymphoma. He recently had a fall and fractured his ankle, but otherwise has felt well. He continues to have multiple urinary complaints that are being monitored by his urologist. He has no neurologic complaints. He denies any recent fevers or illnesses. He has no night sweats. He denies any chest pain or shortness of breath. He has no nausea, vomiting, constipation, or diarrhea. He offers no further specific complaints.   REVIEW OF SYSTEMS:   Review of Systems  Constitutional: Negative.  Negative for fever, malaise/fatigue and weight loss.  Respiratory: Negative.  Negative for cough and shortness of breath.   Cardiovascular: Negative.  Negative for chest pain and leg swelling.  Gastrointestinal: Negative.  Negative for abdominal pain.  Genitourinary: Positive for frequency and urgency.  Musculoskeletal: Positive for joint pain.  Neurological: Negative.   Psychiatric/Behavioral: The patient is not nervous/anxious.     As per HPI. Otherwise, a complete review of systems is negative.  ONCOLOGY HISTORY:  No history exists.    PAST MEDICAL HISTORY: Past Medical History:  Diagnosis Date  . Arteriosclerosis of coronary artery 08/26/2011   Overview:      a.  1999 PCI of the mid LAD with stent.      b.  2002 [facility] [place]: EF 56%. RCA-distal 25/25%. Left main-50% ostial.  Left circumflex-25% OM2.  LAD-25% proximal.  75% mid.  25/25% distal. 75% D1.      c.  07/2011 PCI of RCA with DES.[place]   . Bleeding gastrointestinal 08/26/2011     Overview:      a.  Chronic abdominal pain, present improving.      b.  Duodenitis and gastritis by EGD in 2000.      c.  Pylori in 2000, treated.      d.  Gastroesophageal reflux disease.      e.  Diverticular disease.    . BP (high blood pressure) 08/26/2011  . Cancer ([place]) 2015   Lymphoma  . Heart disease   . Helicobacter pylori infection 06/18/2015   by EGD RX 08/18/1999   . History of adenomatous polyp of colon 06/18/2015    PAST SURGICAL HISTORY: Past Surgical History:  Procedure Laterality Date  . ANGIOPLASTY    . CATARACT EXTRACTION    . GREEN LIGHT LASER TURP (TRANSURETHRAL RESECTION OF PROSTATE N/A 04/16/2015   Procedure: GREEN LIGHT LASER TURP (TRANSURETHRAL RESECTION OF PROSTATE WITH BLADDER BIOPSY;  Surgeon: Collier Flowers, MD;  Location: [facility] ORS;  Service: Urology;  Laterality: N/A;  . heart stent placement     1998, 2000, 2012    FAMILY HISTORY Family History  Problem Relation Age of Onset  . Osteoporosis Mother   . Alzheimer's disease Father   . Kidney disease Neg Hx   . Prostate cancer Neg Hx    AVANCED DIRECTIVES:    HEALTH MAINTENANCE: Social History  Substance Use Topics  . Smoking status: Former Smoker    Packs/day: 1.50    Years: 12.00    Types: Cigarettes    Quit date: 08/23/1981  . Smokeless tobacco: Never Used  .  Alcohol use No    Allergies  Allergen Reactions  . Ciprofloxacin Diarrhea  . Lisinopril Other (See Comments)  . Penicillins     unknwon reaction.  tolerates amoxicillin  . Sulfa Antibiotics Itching and Rash    Rash    No current facility-administered medications for this visit.    Current Outpatient Prescriptions  Medication Sig Dispense Refill  . aspirin 81 MG tablet Take 81 mg by mouth daily.    Marland Kitchen atorvastatin (LIPITOR) 10 MG tablet Take 1 tablet (10 mg total) by mouth daily. 90 tablet 4  . clopidogrel (PLAVIX) 75 MG tablet Take 1 tablet (75 mg total) by mouth daily. 90 tablet 4  . finasteride (PROSCAR) 5 MG tablet Take 1  tablet (5 mg total) by mouth daily. 30 tablet 12  . hydrochlorothiazide (HYDRODIURIL) 25 MG tablet Take 1 tablet (25 mg total) by mouth daily. 90 tablet 4  . isosorbide dinitrate (ISORDIL) 30 MG tablet Take 1 tablet (30 mg total) by mouth every morning. 90 tablet 4  . Methen-Hyosc-Meth Blue-Na Phos (UROGESIC-BLUE) 81.6 MG TABS take 1 tablet by mouth four times a day 120 tablet 3  . metoprolol (LOPRESSOR) 100 MG tablet Take 1 tablet (100 mg total) by mouth 2 (two) times daily. 180 tablet 4  . nitroGLYCERIN (NITROSTAT) 0.4 MG SL tablet Place 0.4 mg under the tongue every 5 (five) minutes as needed for chest pain. Reported on 03/10/2016    . omeprazole (PRILOSEC) 40 MG capsule Take 1 capsule (40 mg total) by mouth 2 (two) times daily. 180 capsule 4  . ranitidine (ZANTAC) 150 MG tablet Take 1 tablet (150 mg total) by mouth 2 (two) times daily. Reported on 03/10/2016 180 tablet 4  . sucralfate (CARAFATE) 1 G tablet Take 1 g by mouth 2 (two) times daily.    Marland Kitchen triamcinolone cream (KENALOG) 0.1 % Apply 1 application topically 4 (four) times daily. 30 g 0  . atorvastatin (LIPITOR) 40 MG tablet Take 20 mg by mouth daily.    Marland Kitchen oxyCODONE-acetaminophen (PERCOCET) 7.5-325 MG tablet Take 1 tablet by mouth every 4 (four) hours as needed for severe pain.    . ramipril (ALTACE) 2.5 MG capsule Take 2.5 mg by mouth daily.    . traMADol (ULTRAM) 50 MG tablet Take 1 tablet (50 mg total) by mouth every 12 (twelve) hours as needed. (Patient not taking: Reported on 09/13/2016) 12 tablet 0   Facility-Administered Medications Ordered in Other Visits  Medication Dose Route Frequency Provider Last Rate Last Dose  . aspirin chewable tablet 81 mg  81 mg Oral Daily Orbie Pyo, MD      . atorvastatin (LIPITOR) tablet 10 mg  10 mg Oral Daily Orbie Pyo, MD      . atorvastatin (LIPITOR) tablet 20 mg  20 mg Oral Daily Orbie Pyo, MD      . clopidogrel (PLAVIX) tablet 75 mg  75 mg Oral Daily Orbie Pyo, MD      . finasteride (PROSCAR) tablet 5 mg  5 mg Oral Daily Orbie Pyo, MD      . hydrochlorothiazide (HYDRODIURIL) tablet 25 mg  25 mg Oral Daily Orbie Pyo, MD      . isosorbide dinitrate (ISORDIL) tablet 30 mg  30 mg Oral BH-[id number] Orbie Pyo, MD      . lidocaine (XYLOCAINE) 2 % jelly 1 application  1 application Urethral Once Orbie Pyo, MD      . metoprolol (LOPRESSOR) tablet  100 mg  100 mg Oral BID Orbie Pyo, MD   100 mg at 09/15/16 0028  . nitroGLYCERIN (NITROSTAT) SL tablet 0.4 mg  0.4 mg Sublingual Q5 min PRN Orbie Pyo, MD      . oxyCODONE-acetaminophen [facility]) 7.5-325 MG per tablet 1 tablet  1 tablet Oral Q4H PRN Orbie Pyo, MD   1 tablet at 09/15/16 0226  . pantoprazole (PROTONIX) EC tablet 40 mg  40 mg Oral Daily Orbie Pyo, MD      . ramipril (ALTACE) capsule 2.5 mg  2.5 mg Oral Daily Orbie Pyo, MD      . sucralfate (CARAFATE) tablet 1 g  1 g Oral BID Orbie Pyo, MD   1 g at 09/15/16 0028  . URELLE (URELLE/URISED) 81 MG tablet 81 mg  1 tablet Oral QID Orbie Pyo, MD        OBJECTIVE: BP 124/68 (BP Location: Left Arm, Patient Position: Sitting)   Pulse 82   Temp 98.9 F (37.2 C) (Tympanic)   Wt 259 lb (117.5 kg)   BMI 33.25 kg/m    Body mass index is 33.25 kg/m.    ECOG FS:0 - Asymptomatic  General: Well-developed, well-nourished, no acute distress. Eyes: Pink conjunctiva, anicteric sclera. HEENT: Normocephalic, moist mucous membranes, clear oropharnyx. Lungs: Clear to auscultation bilaterally. Heart: Regular rate and rhythm. No rubs, murmurs, or gallops. Abdomen: Soft, nontender, nondistended. No organomegaly noted, normoactive bowel sounds. Musculoskeletal: No edema, cyanosis, or clubbing. Neuro: Alert, answering all questions appropriately. Cranial nerves grossly intact. Skin: No rashes or petechiae  noted. Psych: Normal affect. Lymphatics: No cervical, calvicular, axillary  LAD.   LAB RESULTS:  Appointment on 09/13/2016  Component Date Value Ref Range Status  . WBC 09/13/2016 8.6  3.8 - 10.6 K/uL Final  . RBC 09/13/2016 4.11* 4.40 - 5.90 MIL/uL Final  . Hemoglobin 09/13/2016 11.9* 13.0 - 18.0 g/dL Final  . HCT 09/13/2016 35.3* 40.0 - 52.0 % Final  . MCV 09/13/2016 85.9  80.0 - 100.0 fL Final  . MCH 09/13/2016 29.0  26.0 - 34.0 pg Final  . MCHC 09/13/2016 33.7  32.0 - 36.0 g/dL Final  . RDW 09/13/2016 15.5* 11.5 - 14.5 % Final  . Platelets 09/13/2016 117* 150 - 440 K/uL Final  . Neutrophils Relative % 09/13/2016 76  % Final  . Neutro Abs 09/13/2016 6.5  1.4 - 6.5 K/uL Final  . Lymphocytes Relative 09/13/2016 10  % Final  . Lymphs Abs 09/13/2016 0.9* 1.0 - 3.6 K/uL Final  . Monocytes Relative 09/13/2016 10  % Final  . Monocytes Absolute 09/13/2016 0.9  0.2 - 1.0 K/uL Final  . Eosinophils Relative 09/13/2016 4  % Final  . Eosinophils Absolute 09/13/2016 0.3  0 - 0.7 K/uL Final  . Basophils Relative 09/13/2016 0  % Final  . Basophils Absolute 09/13/2016 0.0  0 - 0.1 K/uL Final  . Sodium 09/13/2016 133* 135 - 145 mmol/L Final  . Potassium 09/13/2016 3.3* 3.5 - 5.1 mmol/L Final  . Chloride 09/13/2016 98* 101 - 111 mmol/L Final  . CO2 09/13/2016 27  22 - 32 mmol/L Final  . Glucose, Bld 09/13/2016 126* 65 - 99 mg/dL Final  . BUN 09/13/2016 23* 6 - 20 mg/dL Final  . Creatinine, Ser 09/13/2016 1.54* 0.61 - 1.24 mg/dL Final  . Calcium 09/13/2016 7.8* 8.9 - 10.3 mg/dL Final  . Total Protein 09/13/2016 6.7  6.5 - 8.1 g/dL Final  . Albumin 09/13/2016 3.3* 3.5 -  5.0 g/dL Final  . AST 09/13/2016 24  15 - 41 U/L Final  . ALT 09/13/2016 28  17 - 63 U/L Final  . Alkaline Phosphatase 09/13/2016 49  38 - 126 U/L Final  . Total Bilirubin 09/13/2016 0.5  0.3 - 1.2 mg/dL Final  . GFR calc non Af Amer 09/13/2016 40* >60 mL/min Final  . GFR calc Af Amer 09/13/2016 47* >60 mL/min Final    Comment: (NOTE) The eGFR has been calculated using the CKD EPI equation. This calculation has not been validated in all clinical situations. eGFR's persistently <60 mL/min signify possible Chronic Kidney Disease.   . Anion gap 09/13/2016 8  5 - 15 Final  . LDH 09/13/2016 179  98 - 192 U/L Final    STUDIES: Dg Ankle 2 Views Right  Result Date: 09/11/2016 CLINICAL DATA:  [place] down steps today. Right ankle pain and deformity. Initial encounter. EXAM: RIGHT ANKLE - 2 VIEW COMPARISON:  None. FINDINGS: Fracture through the inferior tip of the medial malleolus is seen which is displaced laterally. Posterior dislocation of the talus is seen. There is widening of the ankle mortise, consistent with intraosseous ligament teara. IMPRESSION: Medial malleolar fracture, with posterior talar dislocation. Widening of ankle mortise, consistent with interosseous ligament tear. Electronically Signed   By: Earle Gell M.D.   On: 09/11/2016 17:52   Dg Ankle Complete Right  Result Date: 09/11/2016 CLINICAL DATA:  Followup right ankle fracture dislocation. Status postreduction. Initial encounter. EXAM: RIGHT ANKLE - COMPLETE 3+ VIEW COMPARISON:  Prior today FINDINGS: Fracture through the inferior aspect of the medial malleolus is again seen but shows decreased displacement since prior study. The talus is centered within the ankle mortise. Decreased widening of the ankle mortise noted. Probable minimally displaced fracture of the posterior malleolus of the tibia also noted on lateral view. A fiberglass cast has been applied. IMPRESSION: Successful reduction of previously seen ankle dislocation. Decreased widening of ankle mortise. Fracture of the medial malleolus, and probable fracture involving the posterior malleolus. Electronically Signed   By: Earle Gell M.D.   On: 09/11/2016 19:49   Ct Head Wo Contrast  Result Date: 09/11/2016 CLINICAL DATA:  Pt states he fell down the back steps while trying to help movers move a  couch. Pt denies LOC. Pt denies hx of stroke, but states hx of lymphoma. EXAM: CT HEAD WITHOUT CONTRAST TECHNIQUE: Contiguous axial images were obtained from the base of the skull through the vertex without intravenous contrast. COMPARISON:  None. FINDINGS: Brain: No intracranial hemorrhage. No parenchymal contusion. No midline shift or mass effect. Basilar cisterns are patent. No skull base fracture. No fluid in the paranasal sinuses or mastoid air cells. Orbits are normal. Vascular: No hyperdense vessel or unexpected calcification. Skull: Normal. Negative for fracture or focal lesion. Sinuses/Orbits: Paranasal sinuses and mastoid air cells are clear. Orbits are clear. Other: Very small scalp hematoma over the occipital bone. IMPRESSION: 1. Very small posterior scalp hematoma. 2. No fracture. 3. No intracranial trauma. Electronically Signed   By: Suzy Bouchard M.D.   On: 09/11/2016 17:48   US Venous Img Lower Unilateral Right  Result Date: 09/13/2016 CLINICAL DATA:  83 year old male with a history of right leg pain and swelling EXAM: RIGHT LOWER EXTREMITY VENOUS DOPPLER ULTRASOUND TECHNIQUE: Gray-scale sonography with graded compression, as well as color Doppler and duplex ultrasound were performed to evaluate the lower extremity deep venous systems from the level of the common femoral vein and including the common femoral, femoral, profunda  femoral, popliteal and calf veins including the posterior tibial, peroneal and gastrocnemius veins when visible. The superficial great saphenous vein was also interrogated. Spectral Doppler was utilized to evaluate flow at rest and with distal augmentation maneuvers in the common femoral, femoral and popliteal veins. COMPARISON:  None. FINDINGS: Contralateral Common Femoral Vein: Respiratory phasicity is normal and symmetric with the symptomatic side. No evidence of thrombus. Normal compressibility. Common Femoral Vein: No evidence of thrombus. Normal compressibility,  respiratory phasicity and response to augmentation. Saphenofemoral Junction: No evidence of thrombus. Normal compressibility and flow on color Doppler imaging. Profunda Femoral Vein: No evidence of thrombus. Normal compressibility and flow on color Doppler imaging. Femoral Vein: No evidence of thrombus. Normal compressibility, respiratory phasicity and response to augmentation. Popliteal Vein: No evidence of thrombus. Normal compressibility, respiratory phasicity and response to augmentation. Calf Veins: No evidence of thrombus. Normal compressibility and flow on color Doppler imaging. Superficial Great Saphenous Vein: No evidence of thrombus. Normal compressibility and flow on color Doppler imaging. Other Findings:  None. IMPRESSION: Sonographic survey of the right lower extremity negative for DVT Signed, Dulcy Fanny. Earleen Newport, DO [facility] Electronically Signed   By: Corrie Mckusick D.O.   On: 09/13/2016 17:55   Dg Foot 2 Views Right  Result Date: 09/11/2016 CLINICAL DATA:  Ankle fracture-dislocation. Status post reduction and casting. Initial encounter. EXAM: RIGHT FOOT - 2 VIEW COMPARISON:  Right ankle radiographs also obtained today. FINDINGS: Fracture through the medial malleolus of the ankle again noted . Fiberglass cast has been applied which obscures fine bone detail. A nondisplaced fracture is seen involving the medial aspect of the navicular. No other fractures are seen involving foot. No evidence of dislocation. Plantar and dorsal calcaneal bone spurs noted. IMPRESSION: Nondisplaced fracture through the medial aspect of the navicular. Medial malleolar fracture of ankle again noted. Electronically Signed   By: Earle Gell M.D.   On: 09/11/2016 19:46     ASSESSMENT & PLAN:    1. Diffuse large B Cell Lymphoma: Patient initially diagnosed in August 2015. 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 He completed 6 cycles of R CHOP in January 2016. PET scan in  January 2017 revealed no evidence of disease. CT scan of the abdomen and pelvis in September 2017 also revealed no evidence of disease. Will repeat CT scan in March 2018 at which point patient can likely be imaged yearly. Return to clinic one to 2 days after his imaging for further evaluation. Have also recommended patient have his port removed.  2. Broken ankle: Patient has an appointment with orthopedics later today.  Patient expressed understanding and was in agreement with this plan. He also understands that He can call clinic at any time with any questions, concerns, or complaints.    Bryan Huger, MD   09/15/2016 8:46 AM

## 2016-09-13 ENCOUNTER — Inpatient Hospital Stay (HOSPITAL_BASED_OUTPATIENT_CLINIC_OR_DEPARTMENT_OTHER): Payer: PPO | Admitting: Oncology

## 2016-09-13 ENCOUNTER — Inpatient Hospital Stay: Payer: PPO | Attending: Oncology

## 2016-09-13 ENCOUNTER — Inpatient Hospital Stay: Payer: PPO

## 2016-09-13 ENCOUNTER — Telehealth: Payer: Self-pay | Admitting: Family Medicine

## 2016-09-13 ENCOUNTER — Other Ambulatory Visit (HOSPITAL_COMMUNITY): Payer: Self-pay | Admitting: Podiatry

## 2016-09-13 ENCOUNTER — Ambulatory Visit
Admission: RE | Admit: 2016-09-13 | Discharge: 2016-09-13 | Disposition: A | Discharge status: Home / Self Care | Payer: PPO | Source: Ambulatory Visit | Attending: Podiatry | Admitting: Podiatry

## 2016-09-13 VITALS — BP 124/68 | HR 82 | Temp 98.9°F | Wt 259.0 lb

## 2016-09-13 DIAGNOSIS — Z86010 Personal history of colonic polyps: Secondary | ICD-10-CM

## 2016-09-13 DIAGNOSIS — M2550 Pain in unspecified joint: Secondary | ICD-10-CM

## 2016-09-13 DIAGNOSIS — Z8619 Personal history of other infectious and parasitic diseases: Secondary | ICD-10-CM

## 2016-09-13 DIAGNOSIS — S93431A Sprain of tibiofibular ligament of right ankle, initial encounter: Secondary | ICD-10-CM | POA: Diagnosis not present

## 2016-09-13 DIAGNOSIS — I509 Heart failure, unspecified: Secondary | ICD-10-CM

## 2016-09-13 DIAGNOSIS — Z7982 Long term (current) use of aspirin: Secondary | ICD-10-CM | POA: Insufficient documentation

## 2016-09-13 DIAGNOSIS — M7989 Other specified soft tissue disorders: Secondary | ICD-10-CM | POA: Insufficient documentation

## 2016-09-13 DIAGNOSIS — S92101S Unspecified fracture of right talus, sequela: Secondary | ICD-10-CM | POA: Insufficient documentation

## 2016-09-13 DIAGNOSIS — Z87891 Personal history of nicotine dependence: Secondary | ICD-10-CM | POA: Insufficient documentation

## 2016-09-13 DIAGNOSIS — R3915 Urgency of urination: Secondary | ICD-10-CM | POA: Insufficient documentation

## 2016-09-13 DIAGNOSIS — R350 Frequency of micturition: Secondary | ICD-10-CM

## 2016-09-13 DIAGNOSIS — R2241 Localized swelling, mass and lump, right lower limb: Secondary | ICD-10-CM | POA: Diagnosis not present

## 2016-09-13 DIAGNOSIS — M79604 Pain in right leg: Secondary | ICD-10-CM

## 2016-09-13 DIAGNOSIS — C8590 Non-Hodgkin lymphoma, unspecified, unspecified site: Secondary | ICD-10-CM

## 2016-09-13 DIAGNOSIS — Z79899 Other long term (current) drug therapy: Secondary | ICD-10-CM

## 2016-09-13 DIAGNOSIS — C8330 Diffuse large B-cell lymphoma, unspecified site: Secondary | ICD-10-CM

## 2016-09-13 DIAGNOSIS — Z8781 Personal history of (healed) traumatic fracture: Secondary | ICD-10-CM

## 2016-09-13 DIAGNOSIS — S8254XA Nondisplaced fracture of medial malleolus of right tibia, initial encounter for closed fracture: Secondary | ICD-10-CM | POA: Diagnosis not present

## 2016-09-13 DIAGNOSIS — I2510 Atherosclerotic heart disease of native coronary artery without angina pectoris: Secondary | ICD-10-CM | POA: Insufficient documentation

## 2016-09-13 DIAGNOSIS — R600 Localized edema: Secondary | ICD-10-CM | POA: Diagnosis not present

## 2016-09-13 DIAGNOSIS — Z95828 Presence of other vascular implants and grafts: Secondary | ICD-10-CM

## 2016-09-13 LAB — COMPREHENSIVE METABOLIC PANEL
ALBUMIN: 3.3 g/dL — AB (ref 3.5–5.0)
ALK PHOS: 49 U/L (ref 38–126)
ALT: 28 U/L (ref 17–63)
ANION GAP: 8 (ref 5–15)
AST: 24 U/L (ref 15–41)
BILIRUBIN TOTAL: 0.5 mg/dL (ref 0.3–1.2)
BUN: 23 mg/dL — ABNORMAL HIGH (ref 6–20)
CALCIUM: 7.8 mg/dL — AB (ref 8.9–10.3)
CO2: 27 mmol/L (ref 22–32)
Chloride: 98 mmol/L — ABNORMAL LOW (ref 101–111)
Creatinine, Ser: 1.54 mg/dL — ABNORMAL HIGH (ref 0.61–1.24)
GFR calc Af Amer: 47 mL/min — ABNORMAL LOW (ref >60)
GFR calc non Af Amer: 40 mL/min — ABNORMAL LOW (ref >60)
GLUCOSE: 126 mg/dL — AB (ref 65–99)
Potassium: 3.3 mmol/L — ABNORMAL LOW (ref 3.5–5.1)
Sodium: 133 mmol/L — ABNORMAL LOW (ref 135–145)
TOTAL PROTEIN: 6.7 g/dL (ref 6.5–8.1)

## 2016-09-13 LAB — CBC WITH DIFFERENTIAL/PLATELET
BASOS PCT: 0 %
Basophils Absolute: 0 10*3/uL (ref 0–0.1)
Eosinophils Absolute: 0.3 10*3/uL (ref 0–0.7)
Eosinophils Relative: 4 %
HEMATOCRIT: 35.3 % — AB (ref 40.0–52.0)
HEMOGLOBIN: 11.9 g/dL — AB (ref 13.0–18.0)
LYMPHS ABS: 0.9 10*3/uL — AB (ref 1.0–3.6)
Lymphocytes Relative: 10 %
MCH: 29 pg (ref 26.0–34.0)
MCHC: 33.7 g/dL (ref 32.0–36.0)
MCV: 85.9 fL (ref 80.0–100.0)
MONO ABS: 0.9 10*3/uL (ref 0.2–1.0)
MONOS PCT: 10 %
NEUTROS ABS: 6.5 10*3/uL (ref 1.4–6.5)
NEUTROS PCT: 76 %
Platelets: 117 10*3/uL — ABNORMAL LOW (ref 150–440)
RBC: 4.11 MIL/uL — ABNORMAL LOW (ref 4.40–5.90)
RDW: 15.5 % — ABNORMAL HIGH (ref 11.5–14.5)
WBC: 8.6 10*3/uL (ref 3.8–10.6)

## 2016-09-13 LAB — LACTATE DEHYDROGENASE: LDH: 179 U/L (ref 98–192)

## 2016-09-13 MED ORDER — HEPARIN SOD (PORK) LOCK FLUSH 100 UNIT/ML IV SOLN
500.0000 [IU] | Freq: Once | INTRAVENOUS | Status: AC
  Administered 2016-09-13: 500 [IU] via INTRAVENOUS

## 2016-09-13 MED ORDER — SODIUM CHLORIDE 0.9% FLUSH
10.0000 mL | INTRAVENOUS | Status: DC | PRN
  Administered 2016-09-13: 10 mL via INTRAVENOUS
  Filled 2016-09-13: qty 10

## 2016-09-13 NOTE — Progress Notes (Signed)
Patient ambulates via wheelchair, due to his foot injury, patient brought to exam room 8 accompanied by a family friend.  Vitals stable and documented

## 2016-09-13 NOTE — Telephone Encounter (Signed)
Pt was discharged from [facility] ER on 09/11/2016 for a broken right foot.  I have scheduled a hospital follow up appointment/MW

## 2016-09-13 NOTE — Telephone Encounter (Signed)
Please call patient for transition into care call. Thanks!

## 2016-09-13 NOTE — Telephone Encounter (Signed)
I do not do TCM calls on ED only visits, sorry! -MM

## 2016-09-14 ENCOUNTER — Ambulatory Visit (INDEPENDENT_AMBULATORY_CARE_PROVIDER_SITE_OTHER): Payer: PPO | Admitting: Family Medicine

## 2016-09-14 ENCOUNTER — Encounter: Payer: Self-pay | Admitting: Emergency Medicine

## 2016-09-14 ENCOUNTER — Emergency Department
Admission: EM | Admit: 2016-09-14 | Discharge: 2016-09-16 | Disposition: A | Discharge status: Home / Self Care | Payer: PPO | Attending: Emergency Medicine | Admitting: Emergency Medicine

## 2016-09-14 ENCOUNTER — Encounter: Payer: Self-pay | Admitting: Family Medicine

## 2016-09-14 DIAGNOSIS — Z85828 Personal history of other malignant neoplasm of skin: Secondary | ICD-10-CM | POA: Insufficient documentation

## 2016-09-14 DIAGNOSIS — Z7982 Long term (current) use of aspirin: Secondary | ICD-10-CM | POA: Insufficient documentation

## 2016-09-14 DIAGNOSIS — S9304XA Dislocation of right ankle joint, initial encounter: Secondary | ICD-10-CM

## 2016-09-14 DIAGNOSIS — W19XXXD Unspecified fall, subsequent encounter: Secondary | ICD-10-CM | POA: Diagnosis not present

## 2016-09-14 DIAGNOSIS — S82891D Other fracture of right lower leg, subsequent encounter for closed fracture with routine healing: Secondary | ICD-10-CM | POA: Insufficient documentation

## 2016-09-14 DIAGNOSIS — Z79899 Other long term (current) drug therapy: Secondary | ICD-10-CM | POA: Diagnosis not present

## 2016-09-14 DIAGNOSIS — S82891A Other fracture of right lower leg, initial encounter for closed fracture: Secondary | ICD-10-CM

## 2016-09-14 DIAGNOSIS — I2510 Atherosclerotic heart disease of native coronary artery without angina pectoris: Secondary | ICD-10-CM | POA: Insufficient documentation

## 2016-09-14 DIAGNOSIS — R531 Weakness: Secondary | ICD-10-CM | POA: Insufficient documentation

## 2016-09-14 DIAGNOSIS — R300 Dysuria: Secondary | ICD-10-CM

## 2016-09-14 DIAGNOSIS — R296 Repeated falls: Secondary | ICD-10-CM | POA: Insufficient documentation

## 2016-09-14 DIAGNOSIS — Z87891 Personal history of nicotine dependence: Secondary | ICD-10-CM | POA: Insufficient documentation

## 2016-09-14 DIAGNOSIS — R2681 Unsteadiness on feet: Secondary | ICD-10-CM

## 2016-09-14 LAB — BASIC METABOLIC PANEL
Anion gap: 10 (ref 5–15)
BUN: 21 mg/dL — ABNORMAL HIGH (ref 6–20)
CALCIUM: 8.1 mg/dL — AB (ref 8.9–10.3)
CHLORIDE: 101 mmol/L (ref 101–111)
CO2: 28 mmol/L (ref 22–32)
CREATININE: 1.42 mg/dL — AB (ref 0.61–1.24)
GFR calc Af Amer: 52 mL/min — ABNORMAL LOW (ref >60)
GFR calc non Af Amer: 44 mL/min — ABNORMAL LOW (ref >60)
GLUCOSE: 126 mg/dL — AB (ref 65–99)
Potassium: 3.1 mmol/L — ABNORMAL LOW (ref 3.5–5.1)
Sodium: 139 mmol/L (ref 135–145)

## 2016-09-14 LAB — URINALYSIS, COMPLETE (UACMP) WITH MICROSCOPIC
Bacteria, UA: NONE SEEN
Bilirubin Urine: NEGATIVE
GLUCOSE, UA: NEGATIVE mg/dL
Hgb urine dipstick: NEGATIVE
Ketones, ur: NEGATIVE mg/dL
Leukocytes, UA: NEGATIVE
Nitrite: NEGATIVE
PH: 6 (ref 5.0–8.0)
Protein, ur: 30 mg/dL — AB
SPECIFIC GRAVITY, URINE: 1.018 (ref 1.005–1.030)
SQUAMOUS EPITHELIAL / LPF: NONE SEEN

## 2016-09-14 LAB — CBC
HCT: 34.6 % — ABNORMAL LOW (ref 40.0–52.0)
Hemoglobin: 11.6 g/dL — ABNORMAL LOW (ref 13.0–18.0)
MCH: 29.2 pg (ref 26.0–34.0)
MCHC: 33.7 g/dL (ref 32.0–36.0)
MCV: 86.7 fL (ref 80.0–100.0)
PLATELETS: 145 10*3/uL — AB (ref 150–440)
RBC: 3.99 MIL/uL — AB (ref 4.40–5.90)
RDW: 15.3 % — AB (ref 11.5–14.5)
WBC: 7 10*3/uL (ref 3.8–10.6)

## 2016-09-14 LAB — TROPONIN I: Troponin I: <0.03 ng/mL (ref <0.03)

## 2016-09-14 MED ORDER — URELLE 81 MG PO TABS
1.0000 | ORAL_TABLET | Freq: Four times a day (QID) | ORAL | Status: DC
Start: 2016-09-14 — End: 2016-09-16
  Administered 2016-09-15 (×4): 81 mg via ORAL
  Filled 2016-09-14 (×11): qty 1

## 2016-09-14 MED ORDER — CLOPIDOGREL BISULFATE 75 MG PO TABS
75.0000 mg | ORAL_TABLET | Freq: Every day | ORAL | Status: DC
  Administered 2016-09-15: 75 mg via ORAL
  Filled 2016-09-14: qty 1

## 2016-09-14 MED ORDER — ATORVASTATIN CALCIUM 20 MG PO TABS
20.0000 mg | ORAL_TABLET | Freq: Every day | ORAL | Status: DC
  Filled 2016-09-14: qty 1

## 2016-09-14 MED ORDER — METOPROLOL TARTRATE 50 MG PO TABS
100.0000 mg | ORAL_TABLET | Freq: Two times a day (BID) | ORAL | Status: DC
  Administered 2016-09-15 (×3): 100 mg via ORAL
  Filled 2016-09-14 (×3): qty 2

## 2016-09-14 MED ORDER — IBUPROFEN 600 MG PO TABS
ORAL_TABLET | ORAL | Status: AC
  Filled 2016-09-14: qty 1

## 2016-09-14 MED ORDER — ASPIRIN 81 MG PO CHEW
81.0000 mg | CHEWABLE_TABLET | Freq: Every day | ORAL | Status: DC
  Administered 2016-09-15: 81 mg via ORAL
  Filled 2016-09-14: qty 1

## 2016-09-14 MED ORDER — ATORVASTATIN CALCIUM 20 MG PO TABS
10.0000 mg | ORAL_TABLET | Freq: Every day | ORAL | Status: DC
  Administered 2016-09-15: 10 mg via ORAL
  Filled 2016-09-14: qty 1

## 2016-09-14 MED ORDER — NITROGLYCERIN 0.4 MG SL SUBL: 0.4000 mg | SUBLINGUAL_TABLET | SUBLINGUAL | Status: DC | PRN

## 2016-09-14 MED ORDER — PANTOPRAZOLE SODIUM 40 MG PO TBEC
40.0000 mg | DELAYED_RELEASE_TABLET | Freq: Every day | ORAL | Status: DC
  Administered 2016-09-15: 40 mg via ORAL
  Filled 2016-09-14: qty 1

## 2016-09-14 MED ORDER — HYDROCHLOROTHIAZIDE 25 MG PO TABS
25.0000 mg | ORAL_TABLET | Freq: Every day | ORAL | Status: DC
  Administered 2016-09-15: 25 mg via ORAL
  Filled 2016-09-14: qty 1

## 2016-09-14 MED ORDER — FINASTERIDE 5 MG PO TABS
5.0000 mg | ORAL_TABLET | Freq: Every day | ORAL | Status: DC
  Administered 2016-09-15: 5 mg via ORAL
  Filled 2016-09-14 (×2): qty 1

## 2016-09-14 MED ORDER — ISOSORBIDE DINITRATE 10 MG PO TABS
30.0000 mg | ORAL_TABLET | ORAL | Status: DC
  Administered 2016-09-15 – 2016-09-16 (×2): 30 mg via ORAL
  Filled 2016-09-14 (×2): qty 3

## 2016-09-14 MED ORDER — OXYCODONE-ACETAMINOPHEN 7.5-325 MG PO TABS
1.0000 | ORAL_TABLET | ORAL | Status: DC | PRN
  Administered 2016-09-15: 1 via ORAL
  Filled 2016-09-14: qty 1

## 2016-09-14 MED ORDER — IBUPROFEN 600 MG PO TABS
600.0000 mg | ORAL_TABLET | Freq: Once | ORAL | Status: AC
  Administered 2016-09-14: 600 mg via ORAL

## 2016-09-14 MED ORDER — LIDOCAINE HCL 2 % EX GEL: 1.0000 "application " | Freq: Once | CUTANEOUS | Status: DC

## 2016-09-14 MED ORDER — RAMIPRIL 2.5 MG PO CAPS
2.5000 mg | ORAL_CAPSULE | Freq: Every day | ORAL | Status: DC
  Administered 2016-09-15: 2.5 mg via ORAL
  Filled 2016-09-14 (×2): qty 1

## 2016-09-14 MED ORDER — SUCRALFATE 1 G PO TABS
1.0000 g | ORAL_TABLET | Freq: Two times a day (BID) | ORAL | Status: DC
Start: 2016-09-14 — End: 2016-09-16
  Administered 2016-09-15 (×3): 1 g via ORAL
  Filled 2016-09-14 (×3): qty 1

## 2016-09-14 NOTE — ED Triage Notes (Signed)
Patient was seen here on Saturday for fracture to right leg. Patient was then seen yesterday at ortho office. Has walker boot in place with bandages underneath. Patient's leg feels warm to touch (unable to determine if leg is warm from being under boot or warm in general). Patient reports generalized weakness since fall, states he is unable to walk on leg. Patient denies unilateral weakness or weakness limited to leg.

## 2016-09-14 NOTE — Progress Notes (Signed)
Patient: Bryan Nelson Male    DOB: May 22, 1932   83 y.o.   MRN: [id number] Visit Date: 09/14/2016  Today's Provider: Lelon Huh, MD   Chief Complaint  Patient presents with  . Hospitalization Follow-up   Subjective:    HPI  Follow up Hospitalization  Patient was admitted to [facility] on 09/11/2016 and discharged on 09/11/2016. He was treated for a fall and a closed fracture of the right foot. Treatment for this included a posterior U splint and Percocet.  He reports good compliance with treatment. Had follow up with podiatry yesterday and next follow up is in 2 weeks.   Patient's nephew came down from [place] when patient was discharge home and reports patient is requiring total care. Patient is unable to get in and out of bed without assistance. Is unable to get up to catheterize himself without assistance. Is unable to get up to prepare meals without assistance.   He states pain had been severe through the weekend. Had been taking oxycodone but it was making him dizzy. He took two Aleve last night and slept better and is much less pain today.          Allergies  Allergen Reactions  . Ciprofloxacin Diarrhea  . Lisinopril Other (See Comments)  . Penicillins     unknwon reaction.  tolerates amoxicillin  . Sulfa Antibiotics Itching and Rash    Rash     Current Outpatient Prescriptions:  .  aspirin 81 MG tablet, Take 81 mg by mouth daily., Disp: , Rfl:  .  atorvastatin (LIPITOR) 10 MG tablet, Take 1 tablet (10 mg total) by mouth daily., Disp: 90 tablet, Rfl: 4 .  clopidogrel (PLAVIX) 75 MG tablet, Take 1 tablet (75 mg total) by mouth daily., Disp: 90 tablet, Rfl: 4 .  hydrochlorothiazide (HYDRODIURIL) 25 MG tablet, Take 1 tablet (25 mg total) by mouth daily., Disp: 90 tablet, Rfl: 4 .  isosorbide dinitrate (ISORDIL) 30 MG tablet, Take 1 tablet (30 mg total) by mouth every morning., Disp: 90 tablet, Rfl: 4 .  metoprolol (LOPRESSOR) 100 MG tablet, Take 1 tablet (100  mg total) by mouth 2 (two) times daily., Disp: 180 tablet, Rfl: 4 .  nitroGLYCERIN (NITROSTAT) 0.4 MG SL tablet, Place 0.4 mg under the tongue every 5 (five) minutes as needed for chest pain. Reported on 03/10/2016, Disp: , Rfl:  .  omeprazole (PRILOSEC) 40 MG capsule, Take 1 capsule (40 mg total) by mouth 2 (two) times daily., Disp: 180 capsule, Rfl: 4 .  oxyCODONE-acetaminophen (ROXICET) 5-325 MG tablet, Take 1-2 tablets by mouth every 6 (six) hours as needed., Disp: 15 tablet, Rfl: 0 .  ranitidine (ZANTAC) 150 MG tablet, Take 1 tablet (150 mg total) by mouth 2 (two) times daily. Reported on 03/10/2016, Disp: 180 tablet, Rfl: 4 .  sucralfate (CARAFATE) 1 G tablet, Take 1 g by mouth 2 (two) times daily., Disp: , Rfl:  .  finasteride (PROSCAR) 5 MG tablet, Take 1 tablet (5 mg total) by mouth daily. (Patient not taking: Reported on 09/14/2016), Disp: 30 tablet, Rfl: 12 .  Methen-Hyosc-Meth Blue-Na Phos (UROGESIC-BLUE) 81.6 MG TABS, take 1 tablet by mouth four times a day (Patient not taking: Reported on 09/14/2016), Disp: 120 tablet, Rfl: 3 .  Omeprazole 20 MG TBEC, Take 40 mg by mouth., Disp: , Rfl:  .  tamsulosin (FLOMAX) 0.4 MG CAPS capsule, Take 0.4 mg by mouth daily., Disp: , Rfl:  .  traMADol (ULTRAM) 50 MG  tablet, Take 1 tablet (50 mg total) by mouth every 12 (twelve) hours as needed. (Patient not taking: Reported on 09/13/2016), Disp: 12 tablet, Rfl: 0 .  triamcinolone cream (KENALOG) 0.1 %, Apply 1 application topically 4 (four) times daily., Disp: 30 g, Rfl: 0  Current Facility-Administered Medications:  .  lidocaine (XYLOCAINE) 2 % jelly 1 application, 1 application, Urethral, Once, Collier Flowers, MD  Review of Systems  Constitutional: Negative.   Respiratory: Negative.   Cardiovascular: Positive for leg swelling.       Occasionally in right leg.   Musculoskeletal: Positive for arthralgias, gait problem, joint swelling and myalgias.  Neurological: Negative for weakness.    Social  History  Substance Use Topics  . Smoking status: Former Smoker    Packs/day: 1.50    Years: 12.00    Types: Cigarettes    Quit date: 08/23/1981  . Smokeless tobacco: Never Used  . Alcohol use No   Objective:   BP 128/78 (BP Location: Right Arm, Patient Position: Sitting, Cuff Size: Normal)   Pulse 76   Temp 98.2 F (36.8 C)   Resp 16   Physical Exam   General Appearance:    Alert, cooperative, no distress, sitting in wheelchair  Eyes:    PERRL, conjunctiva/corneas clear, EOM's intact       Lungs:     Clear to auscultation bilaterally, respirations unlabored  Heart:    Regular rate and rhythm  Neurologic:   Awake, alert, oriented x 3. No apparent focal neurological           defect.   MS:   Patient in boot, unable to stand up from sitting position. Unable to bear weight.        Assessment & Plan:     1. Closed fracture of malleolus of right ankle, initial encounter  2. Closed dislocation of right ankle, initial encounter  Continue follow up podiatry as scheduled.   At this point, patient is completely dependent on his nephew to transfer from bed to chair, dressing, preparing meals and hygiene. Unfortunately his nephew lives in [place] and has to return home tomorrow. Discussed option of home health but patient requires nearly constant assistance for ADLs at this point. He really needs to admitted to inpatient rehab until he is able to care for himself. Will look into options for short-term rehab placement        Lelon Huh, MD  [place]

## 2016-09-14 NOTE — ED Provider Notes (Signed)
[facility] Emergency Department Provider Note  ____________________________________________   First MD Initiated Contact with Patient 09/14/16 2033     (approximate)  I have reviewed the triage vital signs and the nursing notes.   HISTORY  Chief Complaint Leg Pain and Weakness   HPI Bryan Nelson is a 83 y.o. male who had a fracture dislocation of his right ankle 2 days ago who is presenting to the emergency department with weakness today. He has been at home and has been 80 without but does not feel. However, his nephew lives in [place] and is leaving tomorrow. The nephew says that it took about 3 hours to get the patient inside his home today. He says the patient cannot stand it has become weak since having the fracture. The patient was seen by the orthopedist yesterday and also an ultrasound to rule out DVT last night because of worsening swelling to the right ankle. He has been using Aleve at home which has been relieving the pain. Patient also having difficulty standing up and getting to the bathroom and also having difficulty self catheterizing.   Past Medical History:  Diagnosis Date  . Arteriosclerosis of coronary artery 08/26/2011   Overview:      a.  1999 PCI of the mid LAD with stent.      b.  2002 Cath [facility]: EF 56%. RCA-distal 25/25%. Left main-50% ostial.  Left circumflex-25% OM2.  LAD-25% proximal.  75% mid.  25/25% distal. 75% D1.      c.  07/2011 PCI of RCA with DES.[place]   . Bleeding gastrointestinal 08/26/2011   Overview:      a.  Chronic abdominal pain, present improving.      b.  Duodenitis and gastritis by EGD in 2000.      c.  Pylori in 2000, treated.      d.  Gastroesophageal reflux disease.      e.  Diverticular disease.    . BP (high blood pressure) 08/26/2011  . Cancer ([place]) 2015   Lymphoma  . Heart disease   . Helicobacter pylori infection 06/18/2015   by EGD RX 08/18/1999   . History of adenomatous polyp of colon  06/18/2015    Patient Active Problem List   Diagnosis Date Noted  . Malleolar fracture (Right) 09/10/2016  . Closed dislocation of right ankle 09/10/2016  . Recurrent UTI 12/21/2015  . Urinary retention 09/24/2015  . Narrowing of intervertebral disc space 06/18/2015  . Diverticulitis 06/18/2015  . Esophageal reflux 06/18/2015  . Familial multiple lipoprotein-type hyperlipidemia 06/18/2015  . Insomnia 06/18/2015  . Vitamin D deficiency 06/18/2015  . DLBCL (diffuse large B cell lymphoma) ([place]) 04/10/2014  . Benign prostatic hyperplasia with urinary obstruction 08/03/2012  . ED (erectile dysfunction) of organic origin 08/03/2012  . Incomplete bladder emptying 08/03/2012  . Breath shortness 12/09/2011  . Benign prostatic hypertrophy without urinary obstruction 08/26/2011  . Arteriosclerosis of coronary artery 08/26/2011  . Bleeding gastrointestinal 08/26/2011  . HLD (hyperlipidemia) 08/26/2011  . BP (high blood pressure) 08/26/2011  . Disorder of peripheral nervous system ([place]) 08/26/2011  . Enlarged prostate 08/26/2011  . Peripheral nerve disease ([place]) 08/26/2011  . Current tobacco use 08/26/2011    Past Surgical History:  Procedure Laterality Date  . ANGIOPLASTY    . CATARACT EXTRACTION    . GREEN LIGHT LASER TURP (TRANSURETHRAL RESECTION OF PROSTATE N/A 04/16/2015   Procedure: GREEN LIGHT LASER TURP (TRANSURETHRAL RESECTION OF PROSTATE WITH BLADDER BIOPSY;  Surgeon: Delfino Lovett  Cori Razor, MD;  Location: [facility] ORS;  Service: Urology;  Laterality: N/A;  . heart stent placement     1998, 2000, 2012    Prior to Admission medications   Medication Sig Start Date End Date Taking? Authorizing Provider  aspirin 81 MG tablet Take 81 mg by mouth daily.    Historical Provider, MD  atorvastatin (LIPITOR) 10 MG tablet Take 1 tablet (10 mg total) by mouth daily. 08/26/16   [name], MD  clopidogrel (PLAVIX) 75 MG tablet Take 1 tablet (75 mg total) by mouth daily. 08/26/16   [name], MD   finasteride (PROSCAR) 5 MG tablet Take 1 tablet (5 mg total) by mouth daily. Patient not taking: Reported on 09/14/2016 06/15/16   Larene Beach A McGowan, PA-C  hydrochlorothiazide (HYDRODIURIL) 25 MG tablet Take 1 tablet (25 mg total) by mouth daily. 08/26/16   [name], MD  isosorbide dinitrate (ISORDIL) 30 MG tablet Take 1 tablet (30 mg total) by mouth every morning. 08/26/16   [name], MD  Methen-Hyosc-Meth Blue-Na Phos (UROGESIC-BLUE) 81.6 MG TABS take 1 tablet by mouth four times a day Patient not taking: Reported on 09/14/2016 07/22/16   Larene Beach A McGowan, PA-C  metoprolol (LOPRESSOR) 100 MG tablet Take 1 tablet (100 mg total) by mouth 2 (two) times daily. 08/26/16   [name], MD  nitroGLYCERIN (NITROSTAT) 0.4 MG SL tablet Place 0.4 mg under the tongue every 5 (five) minutes as needed for chest pain. Reported on 03/10/2016    Historical Provider, MD  omeprazole (PRILOSEC) 40 MG capsule Take 1 capsule (40 mg total) by mouth 2 (two) times daily. 08/26/16   [name], MD  Omeprazole 20 MG TBEC Take 40 mg by mouth. 08/03/12   Historical Provider, MD  oxyCODONE-acetaminophen (ROXICET) 5-325 MG tablet Take 1-2 tablets by mouth every 6 (six) hours as needed. 09/11/16   Orbie Pyo, MD  ranitidine (ZANTAC) 150 MG tablet Take 1 tablet (150 mg total) by mouth 2 (two) times daily. Reported on 03/10/2016 08/26/16   [name], MD  sucralfate (CARAFATE) 1 G tablet Take 1 g by mouth 2 (two) times daily.    Historical Provider, MD  tamsulosin (FLOMAX) 0.4 MG CAPS capsule Take 0.4 mg by mouth daily.    Historical Provider, MD  traMADol (ULTRAM) 50 MG tablet Take 1 tablet (50 mg total) by mouth every 12 (twelve) hours as needed. Patient not taking: Reported on 09/13/2016 07/12/16   Sable Feil, PA-C  triamcinolone cream (KENALOG) 0.1 % Apply 1 application topically 4 (four) times daily. 04/17/16   Charline Bills Cuthriell, PA-C    Allergies Ciprofloxacin; Lisinopril; Penicillins;  and Sulfa antibiotics  Family History  Problem Relation Age of Onset  . Osteoporosis Mother   . Alzheimer's disease Father   . Kidney disease Neg Hx   . Prostate cancer Neg Hx     Social History Social History  Substance Use Topics  . Smoking status: Former Smoker    Packs/day: 1.50    Years: 12.00    Types: Cigarettes    Quit date: 08/23/1981  . Smokeless tobacco: Never Used  . Alcohol use No    Review of Systems Constitutional: No fever/chills Eyes: No visual changes. ENT: No sore throat. Cardiovascular: Denies chest pain. Respiratory: Denies shortness of breath. Gastrointestinal: No abdominal pain.  No nausea, no vomiting.  No diarrhea.  No constipation. Genitourinary: Negative for dysuria. Musculoskeletal: Negative for back pain. Skin: Negative for rash.  Neurological: Negative for headaches, focal weakness or numbness.  10-point ROS otherwise negative.  ____________________________________________   PHYSICAL EXAM:  VITAL SIGNS: ED Triage Vitals  Enc Vitals Group     BP 09/14/16 1744 118/76     Pulse Rate 09/14/16 1744 86     Resp 09/14/16 1744 18     Temp 09/14/16 1744 98.6 F (37 C)     Temp Source 09/14/16 1744 Oral     SpO2 09/14/16 1744 97 %     Weight 09/14/16 1744 250 lb (113.4 kg)     Height 09/14/16 1744 6\' 1"  (1.854 m)     Head Circumference --      Peak Flow --      Pain Score 09/14/16 1800 4     Pain Loc --      Pain Edu? --      Excl. in [place]? --     Constitutional: Alert and oriented. Well appearing and in no acute distress. Eyes: Conjunctivae are normal. PERRL. EOMI. Head: Atraumatic. Nose: No congestion/rhinnorhea. Mouth/Throat: Mucous membranes are moist.   Neck: No stridor.   Cardiovascular: Normal rate, regular rhythm. Grossly normal heart sounds.  Good peripheral circulation With dorsalis pedis pulses present to the right foot. Respiratory: Normal respiratory effort.  No retractions. Lungs CTAB. Gastrointestinal: Soft and  nontender. No distention.  Musculoskeletal: Right lower extremity with swelling about the right ankle with erythema as well as ecchymosis. The compartments are soft. However, there is tenderness especially medially with the patient has had a fracture. He is able to move his toes. He has sensation that is intact to light touch. He also has an intact dorsalis pedis pulse. The patient's foot was removed from the boot as well as the Ace wrap for the exam. Neurologic:  Normal speech and language. No gross focal neurologic deficits are appreciated.  Skin:  Skin is warm, dry and intact. No rash noted. Psychiatric: Mood and affect are normal. Speech and behavior are normal.  ____________________________________________   LABS (all labs ordered are listed, but only abnormal results are displayed)  Labs Reviewed  BASIC METABOLIC PANEL - Abnormal; Notable for the following:       Result Value   Potassium 3.1 (*)    Glucose, Bld 126 (*)    BUN 21 (*)    Creatinine, Ser 1.42 (*)    Calcium 8.1 (*)    GFR calc non Af Amer 44 (*)    GFR calc Af Amer 52 (*)    All other components within normal limits  CBC - Abnormal; Notable for the following:    RBC 3.99 (*)    Hemoglobin 11.6 (*)    HCT 34.6 (*)    RDW 15.3 (*)    Platelets 145 (*)    All other components within normal limits  TROPONIN I  URINALYSIS, COMPLETE (UACMP) WITH MICROSCOPIC   ____________________________________________  EKG  ED ECG REPORT I, Doran Stabler, the attending physician, personally viewed and interpreted this ECG.   Date: 09/14/2016  EKG Time: 1824  Rate: 86  Rhythm: normal sinus rhythm  Axis: Normal axis  Intervals:none  ST&T Change: No ST segment elevation or depression. No abnormal T-wave inversion.  ____________________________________________  RADIOLOGY   ____________________________________________   PROCEDURES  Procedure(s) performed:   Procedures  Critical Care performed:    ____________________________________________   INITIAL IMPRESSION / ASSESSMENT AND PLAN / ED COURSE  Pertinent labs & imaging results that were available during my care of the  patient were reviewed by me and considered in my medical decision making (see chart for details).  ----------------------------------------- 9:11 PM on 09/14/2016 -----------------------------------------  Patient will be kept in the emergency department for evaluation by physical therapy as well as consult from social work. The patient is not able to complete his ADLs at home because of this acute injury. He was asked if he needs any pain medication at this time and he denies. Patient as well as family are understanding of the plan and willing to comply. Expectant swelling as well as appears to the right ankle after fracture dislocation.      ____________________________________________   FINAL CLINICAL IMPRESSION(S) / ED DIAGNOSES  Weakness. Right ankle fracture dislocation.    NEW MEDICATIONS STARTED DURING THIS VISIT:  New Prescriptions   No medications on file     Note:  This document was prepared using Dragon voice recognition software and may include unintentional dictation errors.    Orbie Pyo, MD 09/14/16 2112

## 2016-09-15 DIAGNOSIS — R339 Retention of urine, unspecified: Secondary | ICD-10-CM | POA: Diagnosis not present

## 2016-09-15 MED ORDER — NAPROXEN 500 MG PO TABS
500.0000 mg | ORAL_TABLET | Freq: Once | ORAL | Status: AC
  Administered 2016-09-15: 500 mg via ORAL

## 2016-09-15 MED ORDER — NAPROXEN 500 MG PO TABS
500.0000 mg | ORAL_TABLET | Freq: Once | ORAL | Status: DC
  Filled 2016-09-15: qty 1

## 2016-09-15 NOTE — ED Provider Notes (Signed)
I spoke with social worker this morning after physical therapy consult recommending rehabilitation stay. I did sign the FL 2. Social worker to complete the rest of the process.   Lisa Roca, MD 09/15/16 [phone]

## 2016-09-15 NOTE — ED Notes (Signed)
Pt provided hospital bed as he is expected to stay another night; see note from [place].

## 2016-09-15 NOTE — ED Notes (Signed)
Lunch tray sat at bedside. Pt sleeping. Ok'ed by [employer]

## 2016-09-15 NOTE — NC FL2 (Signed)
[place] LEVEL OF CARE SCREENING TOOL     IDENTIFICATION  Patient Name: Bryan Nelson Birthdate: [birth year] Sex: male Admission Date (Current Location): 09/14/2016  [place] and [place] Number:  Engineering geologist and Address:  [facility], [address], [place], [place] [address]      Provider Number: [id number]  Attending Physician Name and Address:  No att. providers found  Relative Name and Phone Number:       Current Level of Care: Hospital Recommended Level of Care: [place] Prior Approval Number:    Date Approved/Denied: 09/15/16 PASRR Number: [id number]  Discharge Plan: SNF    Current Diagnoses: Patient Active Problem List   Diagnosis Date Noted  . Malleolar fracture (Right) 09/10/2016  . Closed dislocation of right ankle 09/10/2016  . Recurrent UTI 12/21/2015  . Urinary retention 09/24/2015  . Narrowing of intervertebral disc space 06/18/2015  . Diverticulitis 06/18/2015  . Esophageal reflux 06/18/2015  . Familial multiple lipoprotein-type hyperlipidemia 06/18/2015  . Insomnia 06/18/2015  . Vitamin D deficiency 06/18/2015  . DLBCL (diffuse large B cell lymphoma) ([place]) 04/10/2014  . Benign prostatic hyperplasia with urinary obstruction 08/03/2012  . ED (erectile dysfunction) of organic origin 08/03/2012  . Incomplete bladder emptying 08/03/2012  . Breath shortness 12/09/2011  . Benign prostatic hypertrophy without urinary obstruction 08/26/2011  . Arteriosclerosis of coronary artery 08/26/2011  . Bleeding gastrointestinal 08/26/2011  . HLD (hyperlipidemia) 08/26/2011  . BP (high blood pressure) 08/26/2011  . Disorder of peripheral nervous system ([place]) 08/26/2011  . Enlarged prostate 08/26/2011  . Peripheral nerve disease ([place]) 08/26/2011  . Current tobacco use 08/26/2011    Orientation RESPIRATION BLADDER Height & Weight     Self, Time, Situation, Place  Normal Incontinent Weight: 250 lb  (113.4 kg) Height:  6\' 1"  (185.4 cm)  BEHAVIORAL SYMPTOMS/MOOD NEUROLOGICAL BOWEL NUTRITION STATUS     (None) Continent Diet (Heart Healthy)  AMBULATORY STATUS COMMUNICATION OF NEEDS Skin   Extensive Assist Verbally Normal                       Personal Care Assistance Level of Assistance  Bathing, Feeding, Dressing Bathing Assistance: Limited assistance Feeding assistance: Independent Dressing Assistance: Limited assistance     Functional Limitations Info  Sight, Hearing, Speech Sight Info: Adequate Hearing Info: Impaired (Hard of hearing) Speech Info: Adequate    SPECIAL CARE FACTORS FREQUENCY  PT (By licensed PT), OT (By licensed OT)     PT Frequency: 5x OT Frequency: 5x            Contractures Contractures Info: Not present    Additional Factors Info  Code Status, Allergies Code Status Info: Not on file Allergies Info: Ciprofloxacin, Lisinopril, Penicillins, Sulfa Antibiotics           Current Medications (09/15/2016):  This is the current hospital active medication list Current Facility-Administered Medications  Medication Dose Route Frequency Provider Last Rate Last Dose  . aspirin chewable tablet 81 mg  81 mg Oral Daily Orbie Pyo, MD   81 mg at 09/15/16 0934  . atorvastatin (LIPITOR) tablet 10 mg  10 mg Oral Daily Orbie Pyo, MD   10 mg at 09/15/16 1019  . clopidogrel (PLAVIX) tablet 75 mg  75 mg Oral Daily Orbie Pyo, MD   75 mg at 09/15/16 0933  . finasteride (PROSCAR) tablet 5 mg  5 mg Oral Daily Orbie Pyo, MD   5 mg at  09/15/16 1018  . hydrochlorothiazide (HYDRODIURIL) tablet 25 mg  25 mg Oral Daily Orbie Pyo, MD   25 mg at 09/15/16 2952  . isosorbide dinitrate (ISORDIL) tablet 30 mg  30 mg Oral BH-q7a Orbie Pyo, MD   30 mg at 09/15/16 8413  . lidocaine (XYLOCAINE) 2 % jelly 1 application  1 application Urethral Once Collier Flowers, MD      . lidocaine (XYLOCAINE) 2 %  jelly 1 application  1 application Urethral Once Orbie Pyo, MD      . metoprolol (LOPRESSOR) tablet 100 mg  100 mg Oral BID Orbie Pyo, MD   100 mg at 09/15/16 2440  . nitroGLYCERIN (NITROSTAT) SL tablet 0.4 mg  0.4 mg Sublingual Q5 min PRN Orbie Pyo, MD      . oxyCODONE-acetaminophen [facility]) 7.5-325 MG per tablet 1 tablet  1 tablet Oral Q4H PRN Orbie Pyo, MD   1 tablet at 09/15/16 0226  . pantoprazole (PROTONIX) EC tablet 40 mg  40 mg Oral Daily Orbie Pyo, MD   40 mg at 09/15/16 0934  . ramipril (ALTACE) capsule 2.5 mg  2.5 mg Oral Daily Orbie Pyo, MD   2.5 mg at 09/15/16 1019  . sucralfate (CARAFATE) tablet 1 g  1 g Oral BID Orbie Pyo, MD   1 g at 09/15/16 0934  . URELLE (URELLE/URISED) 81 MG tablet 81 mg  1 tablet Oral QID Orbie Pyo, MD   81 mg at 09/15/16 1027   Current Outpatient Prescriptions  Medication Sig Dispense Refill  . aspirin 81 MG tablet Take 81 mg by mouth daily.    Marland Kitchen atorvastatin (LIPITOR) 10 MG tablet Take 1 tablet (10 mg total) by mouth daily. 90 tablet 4  . atorvastatin (LIPITOR) 40 MG tablet Take 20 mg by mouth daily.    . clopidogrel (PLAVIX) 75 MG tablet Take 1 tablet (75 mg total) by mouth daily. 90 tablet 4  . finasteride (PROSCAR) 5 MG tablet Take 1 tablet (5 mg total) by mouth daily. 30 tablet 12  . hydrochlorothiazide (HYDRODIURIL) 25 MG tablet Take 1 tablet (25 mg total) by mouth daily. 90 tablet 4  . isosorbide dinitrate (ISORDIL) 30 MG tablet Take 1 tablet (30 mg total) by mouth every morning. 90 tablet 4  . Methen-Hyosc-Meth Blue-Na Phos (UROGESIC-BLUE) 81.6 MG TABS take 1 tablet by mouth four times a day 120 tablet 3  . metoprolol (LOPRESSOR) 100 MG tablet Take 1 tablet (100 mg total) by mouth 2 (two) times daily. 180 tablet 4  . nitroGLYCERIN (NITROSTAT) 0.4 MG SL tablet Place 0.4 mg under the tongue every 5 (five) minutes as needed for chest pain.  Reported on 03/10/2016    . omeprazole (PRILOSEC) 40 MG capsule Take 1 capsule (40 mg total) by mouth 2 (two) times daily. 180 capsule 4  . oxyCODONE-acetaminophen (PERCOCET) 7.5-325 MG tablet Take 1 tablet by mouth every 4 (four) hours as needed for severe pain.    . ramipril (ALTACE) 2.5 MG capsule Take 2.5 mg by mouth daily.    . ranitidine (ZANTAC) 150 MG tablet Take 1 tablet (150 mg total) by mouth 2 (two) times daily. Reported on 03/10/2016 180 tablet 4  . sucralfate (CARAFATE) 1 G tablet Take 1 g by mouth 2 (two) times daily.    . traMADol (ULTRAM) 50 MG tablet Take 1 tablet (50 mg total) by mouth every 12 (twelve) hours as needed. (Patient not taking: Reported on 09/13/2016)  12 tablet 0  . triamcinolone cream (KENALOG) 0.1 % Apply 1 application topically 4 (four) times daily. 30 g 0     Discharge Medications: Please see discharge summary for a list of discharge medications.  Relevant Imaging Results:  Relevant Lab Results:   Additional Information SSN: [id number]  Georga Kaufmann, LCSWA

## 2016-09-15 NOTE — Clinical Social Work Note (Signed)
Clinical Social Work Assessment  Patient Details  Name: Bryan Nelson MRN: [id number] Date of Birth: 28-May-1932  Date of referral:  09/15/16               Reason for consult:  Facility Placement                Permission sought to share information with:  Facility Sport and exercise psychologist, Family Supports Permission granted to share information::  Yes, Verbal Permission Granted  Name::      [name] (nephew) [phone]  Agency::     Relationship::     Contact Information:     Housing/Transportation Living arrangements for the past 2 months:  Single Family Home Source of Information:  Patient Patient Interpreter Needed:  None Criminal Activity/Legal Involvement Pertinent to Current Situation/Hospitalization:  No - Comment as needed Significant Relationships:  Friend, Siblings, Other Family Members Lives with:  Self Do you feel safe going back to the place where you live?  No Need for family participation in patient care:  Yes (Comment)   Care giving concerns: Pt is dependent with mobility and unable to live independently.   Social Worker assessment / plan: CSW received consult for facility placement. Pt is an 83 yo white male who presents to the ED with a fracture to his right ankle. CSW engaged with pt at pt's bedside. Pt's family were present at the time of the assessment. CSW explained that PT is recommending SNF at this time. Pt states that he is agreeable and would like to receive rehab at [facility]. CSW completed FL2 for pt and sent referrals to local facilities via the hub. Pt received bed offers at [facility], [facility], and [facility]. Pt states that he would like to go to [facility] CSW has contacted [employer] to begin the insurance authorization process. CSW has completed the insurance authorization paperwork and faxed it to [facility] [employer]. Awaiting insurance approval. When insurance approval is obtained CSW will assist in pt's d/c to  [facility]  Employment status:  Retired Nurse, adult PT Recommendations:  [place] / Referral to community resources:  [place]  Patient/Family's Response to care: Pt will d/c to [facility]. Family will transport.  Patient/Family's Understanding of and Emotional Response to Diagnosis, Current Treatment, and Prognosis: Pt and pt's family are appreciative of the care provided by CSW at this time.  Emotional Assessment Appearance:  Appears younger than stated age Attitude/Demeanor/Rapport:  Other (Cooperative) Affect (typically observed):  Calm, Pleasant Orientation:  Oriented to Self, Oriented to Place, Oriented to  Time, Oriented to Situation Alcohol / Substance use:  Not Applicable Psych involvement (Current and /or in the community):  No (Comment)  Discharge Needs  Concerns to be addressed:  Care Coordination, Discharge Planning Concerns Readmission within the last 30 days:  Yes Current discharge risk:  Dependent with Mobility Barriers to Discharge:  [place], [place] 09/15/2016, 1:35 PM

## 2016-09-15 NOTE — ED Notes (Addendum)
CSW called [employer] at [phone]. Insurance authorization paperwork was received and pt's case is currently under review. Pt's authorization number will be available later today or first thing in the morning tomorrow. Pt is unable to transfer to SNF until insurance auth number is received.   Bryan Nelson, MSW, [place]

## 2016-09-15 NOTE — ED Notes (Signed)
CSW received consult for SNF placement. PT has already seen the pt and is recommending SNF at this time. CSW completed FL2 and sent referrals to local facilities. CSW will monitor the hub and present bed offers to pt and pt's family as they become available.  Georga Kaufmann, MSW, [place]

## 2016-09-15 NOTE — ED Notes (Signed)
Meal tray given at this time 

## 2016-09-15 NOTE — Evaluation (Signed)
Physical Therapy Evaluation Patient Details Name: Bryan Nelson MRN: [id number] DOB: 1932-06-14 Today's Date: 09/15/2016   History of Present Illness  Bryan Nelson is a 83 y.o. male who had a fracture and dislocation of his right ankle 2 days ago who is presenting to the emergency department with weakness. He has been at home for the last few days with family. However, his nephew lives in [place] and is leaving tomorrow. The nephew says that it took about 3 hours to get the patient inside his home today. He says the patient cannot stand it has become weak since having the fracture. The patient was seen by the podiatrist on 09/13/16 and his family physician yesterday. He also had an ultrasound to rule out DVT last night because of worsening swelling to the right ankle. He has been using Aleve at home which has been relieving the pain. Patient also having difficulty standing up and getting to the bathroom and also having difficulty self-catheterization.  Clinical Impression  Pt admitted with above diagnosis. Pt currently with functional limitations due to the deficits listed below (see PT Problem List).  Pt demonstrates limitation in balance and strength during transfers and ambulation. He requires minA+1 assist to stabilize. He is able to maintain NWB on RLE for the most part but does infrequently place his foot on the ground during a step. He reports considerable improvement in gait with use of rolling walker but is still a high fall risk and is unsafe to be at home alone. Pt has been at home with family for the last few days and has been unable to function requiring assistance for all ADLs/IADLs. Family states that it took him 3 hours to get in the house yesterday after returning from his medical appointment. He would benefit from SNF placement in order to improve his safety, balance, and strength to decrease fall risk and improve function at home. Pt will benefit from skilled PT services to  address deficits in strength, balance, and mobility in order to eventually return to full function at home after SNF placement.     Follow Up Recommendations SNF    Equipment Recommendations  Rolling walker with 5" wheels;Other (comment) (TBD at facility. If not placed will need RW and BSC at home)    Recommendations for Other Services       Precautions / Restrictions Precautions Precautions: Fall Required Braces or Orthoses: Other Brace/Splint Other Brace/Splint: Walking boot RLE Restrictions Weight Bearing Restrictions: Yes RLE Weight Bearing: Non weight bearing      Mobility  Bed Mobility Overal bed mobility: Modified Independent             General bed mobility comments: HOB elevated however pt demonstrates fair speed/sequencing  Transfers Overall transfer level: Needs assistance Equipment used: Rolling walker (2 wheeled) Transfers: Sit to/from Stand Sit to Stand: Min assist         General transfer comment: Pt requires minA+1 to stabilize. However demonstrates good LLE strength during transfer. Able to maintain NWB on RLE  Ambulation/Gait Ambulation/Gait assistance: Min assist Ambulation Distance (Feet): 10 Feet Assistive device: Rolling walker (2 wheeled)   Gait velocity: Decreased Gait velocity interpretation: <1.8 ft/sec, indicative of risk for recurrent falls General Gait Details: Pt provided education and training about how to utilize a rolling walker for ambulation. He is able to hop on LLE utilizing rolling walker for bilateral UE support. Pt reports that this is considerably easier for him than using the axillary crutches he was provided  when he initially came to emergency department. HR increases to 110 but SaO2 remains at or above 95% with activity. Pt with minimal fatigue with short distance ambulation. Able to mostly maintain NWB on RLE but does intermittently place R foot on ground. Pt requires minA+1 to stabilize for balance  Stairs             Wheelchair Mobility    Modified Rankin (Stroke Patients Only)       Balance Overall balance assessment: Needs assistance Sitting-balance support: No upper extremity supported Sitting balance-Leahy Scale: Good     Standing balance support: No upper extremity supported Standing balance-Leahy Scale: Poor Standing balance comment: Requires bilateral UE support to maintain balance. However in static stance does not require external assist once upright to remain steady                             Pertinent Vitals/Pain Pain Assessment: No/denies pain    Home Living Family/patient expects to be discharged to:: Skilled nursing facility Living Arrangements: Alone Available Help at Discharge: Other (Comment) (Family was assisting but will be leaving) Type of Home: House Home Access: Stairs to enter Entrance Stairs-Rails: Right;Left (Unable to reach both) Entrance Stairs-Number of Steps: 2 Home Layout: Multi-level;Other (Comment) (Mostly one level but one step down to living room) Home Equipment: Crutches (no walker, no BSC)      Prior Function Level of Independence: Independent         Comments: Prior to fracture but pt was independent with ADLs/IADLs and was driving. Since the fracture he has been requiring assistance for all ADLs/IADLs     Hand Dominance   Dominant Hand: Right    Extremity/Trunk Assessment   Upper Extremity Assessment Upper Extremity Assessment: Overall WFL for tasks assessed    Lower Extremity Assessment Lower Extremity Assessment: RLE deficits/detail RLE Deficits / Details: LLE grossly WFL. R hip flexion 4/5, R knee flexion is at least 3/5, no resistance attempted due to NWB RLE and walking boot RLE: Unable to fully assess due to immobilization       Communication   Communication: No difficulties  Cognition Arousal/Alertness: Awake/alert Behavior During Therapy: WFL for tasks assessed/performed Overall Cognitive Status: Within  Functional Limits for tasks assessed                      General Comments      Exercises     Assessment/Plan    PT Assessment Patient needs continued PT services  PT Problem List Decreased strength;Decreased activity tolerance;Decreased balance;Decreased mobility;Decreased knowledge of use of DME;Decreased safety awareness;Pain          PT Treatment Interventions DME instruction;Gait training;Stair training;Functional mobility training;Therapeutic activities;Therapeutic exercise;Balance training;Neuromuscular re-education;Patient/family education    PT Goals (Current goals can be found in the Care Plan section)  Acute Rehab PT Goals Patient Stated Goal: Return to prior level of function PT Goal Formulation: With patient/family Time For Goal Achievement: 09/29/16 Potential to Achieve Goals: Good    Frequency Min 2X/week   Barriers to discharge Decreased caregiver support Lives alone    Co-evaluation               End of Session Equipment Utilized During Treatment: Gait belt Activity Tolerance: Patient tolerated treatment well Patient left: in bed;with call bell/phone within reach;with nursing/sitter in room Nurse Communication: Mobility status;Weight bearing status    Functional Assessment Tool Used: clinical judgement Functional Limitation: Mobility: Walking  and moving around Mobility: Walking and Moving Around Current Status [phone]): At least 60 percent but less than 80 percent impaired, limited or restricted Mobility: Walking and Moving Around Goal Status [phone]): At least 20 percent but less than 40 percent impaired, limited or restricted    Time: 0945-1020 PT Time Calculation (min) (ACUTE ONLY): 35 min   Charges:   PT Evaluation $PT Eval Moderate Complexity: 1 Procedure PT Treatments $Gait Training: 8-22 mins   PT G Codes:   PT G-Codes **NOT FOR INPATIENT CLASS** Functional Assessment Tool Used: clinical judgement Functional Limitation:  Mobility: Walking and moving around Mobility: Walking and Moving Around Current Status ([id number]): At least 60 percent but less than 80 percent impaired, limited or restricted Mobility: Walking and Moving Around Goal Status [phone]): At least 20 percent but less than 40 percent impaired, limited or restricted   Phillips Grout PT, DPT   Kortny Lirette 09/15/2016, 10:40 AM

## 2016-09-15 NOTE — ED Notes (Signed)
CSW received call from [facility]. Insurance Authorization number has been received. Authorization # is [id number].  CSW called Sharyn Lull at [facility] [phone]) to inform her that authorization number has been received. Sharyn Lull states that the pharmacy has closed for the day and therefore, pt would need to transfer to facility tomorrow. CSW expressed her understanding.  Pt will d/c to [facility]. Sharyn Lull will provide CSW with pt  room number tomorrow. RN can call report to [phone]. Family has been notified (pt's nephew Richardson Landry [phone]) and will transport pt to [facility] tomorrow morning.  Georga Kaufmann, MSW, [place]

## 2016-09-16 ENCOUNTER — Encounter
Admission: RE | Admit: 2016-09-16 | Discharge: 2016-09-16 | Disposition: A | Discharge status: Home / Self Care | Payer: PPO | Source: Ambulatory Visit | Attending: Internal Medicine | Admitting: Internal Medicine

## 2016-09-16 DIAGNOSIS — K219 Gastro-esophageal reflux disease without esophagitis: Secondary | ICD-10-CM | POA: Diagnosis not present

## 2016-09-16 DIAGNOSIS — I2510 Atherosclerotic heart disease of native coronary artery without angina pectoris: Secondary | ICD-10-CM | POA: Diagnosis not present

## 2016-09-16 DIAGNOSIS — R262 Difficulty in walking, not elsewhere classified: Secondary | ICD-10-CM | POA: Diagnosis not present

## 2016-09-16 DIAGNOSIS — Z9079 Acquired absence of other genital organ(s): Secondary | ICD-10-CM | POA: Diagnosis not present

## 2016-09-16 DIAGNOSIS — R05 Cough: Secondary | ICD-10-CM | POA: Insufficient documentation

## 2016-09-16 DIAGNOSIS — Z7901 Long term (current) use of anticoagulants: Secondary | ICD-10-CM | POA: Diagnosis not present

## 2016-09-16 DIAGNOSIS — N138 Other obstructive and reflux uropathy: Secondary | ICD-10-CM | POA: Diagnosis not present

## 2016-09-16 DIAGNOSIS — S82891D Other fracture of right lower leg, subsequent encounter for closed fracture with routine healing: Secondary | ICD-10-CM | POA: Diagnosis not present

## 2016-09-16 DIAGNOSIS — M810 Age-related osteoporosis without current pathological fracture: Secondary | ICD-10-CM | POA: Diagnosis not present

## 2016-09-16 DIAGNOSIS — S8251XD Displaced fracture of medial malleolus of right tibia, subsequent encounter for closed fracture with routine healing: Secondary | ICD-10-CM | POA: Diagnosis not present

## 2016-09-16 DIAGNOSIS — Z7982 Long term (current) use of aspirin: Secondary | ICD-10-CM | POA: Diagnosis not present

## 2016-09-16 DIAGNOSIS — E785 Hyperlipidemia, unspecified: Secondary | ICD-10-CM | POA: Diagnosis not present

## 2016-09-16 DIAGNOSIS — I10 Essential (primary) hypertension: Secondary | ICD-10-CM | POA: Diagnosis not present

## 2016-09-16 DIAGNOSIS — Z87891 Personal history of nicotine dependence: Secondary | ICD-10-CM | POA: Diagnosis not present

## 2016-09-16 DIAGNOSIS — N401 Enlarged prostate with lower urinary tract symptoms: Secondary | ICD-10-CM | POA: Diagnosis not present

## 2016-09-16 DIAGNOSIS — Z9181 History of falling: Secondary | ICD-10-CM | POA: Diagnosis not present

## 2016-09-16 DIAGNOSIS — M6281 Muscle weakness (generalized): Secondary | ICD-10-CM | POA: Diagnosis not present

## 2016-09-16 DIAGNOSIS — Z955 Presence of coronary angioplasty implant and graft: Secondary | ICD-10-CM | POA: Diagnosis not present

## 2016-09-16 MED ORDER — ACETAMINOPHEN 500 MG PO TABS: 1000.0000 mg | ORAL_TABLET | Freq: Three times a day (TID) | ORAL | 0 refills | Status: DC

## 2016-09-16 MED ORDER — OXYCODONE HCL 5 MG PO TABS: 5.0000 mg | ORAL_TABLET | Freq: Four times a day (QID) | ORAL | 0 refills | Status: DC | PRN

## 2016-09-16 NOTE — Discharge Instructions (Signed)
Pain control: Take tylenol 1000mg every 8 hours. Take 5mg of oxycodone every 6 hours for breakthrough pain. If you need the oxycodone make sure to take one senokot as well to prevent constipation.  Do not drink alcohol, drive or participate in any other potentially dangerous activities while taking this medication as it may make you sleepy. Do not take this medication with any other sedating medications, either prescription or over-the-counter.  

## 2016-09-16 NOTE — ED Provider Notes (Signed)
-----------------------------------------   6:51 AM on 09/16/2016 -----------------------------------------   Blood pressure 129/62, pulse (!) 59, temperature 98.6 F (37 C), temperature source Oral, resp. rate 16, height 6\' 1"  (1.854 m), weight 250 lb (113.4 kg), SpO2 98 %.  The patient had no acute events since last update.  Calm and cooperative at this time.  Patient reportedly will be discharged to [facility] today.     Paulette Blanch, MD 09/16/16 [phone]

## 2016-09-16 NOTE — ED Notes (Signed)
Report called to [facility].

## 2016-09-16 NOTE — Clinical Social Work Note (Addendum)
Sharyn Lull with [facility] called CSW with room and report: [phone] report: [phone]. CSW has spoken to ED RN, Vicente Males, and she is aware and will call report. Vicente Males stated that patient's ride was here. Patient to discharge to [facility] today. Auth from insurance: [phone]. Shela Leff MSW,LCSW [phone]

## 2016-09-21 DIAGNOSIS — R05 Cough: Secondary | ICD-10-CM | POA: Diagnosis not present

## 2016-09-21 LAB — INFLUENZA PANEL BY PCR (TYPE A & B)
Influenza A By PCR: POSITIVE — AB
Influenza B By PCR: NEGATIVE

## 2016-09-22 ENCOUNTER — Other Ambulatory Visit: Payer: Self-pay | Admitting: Family Medicine

## 2016-09-22 MED ORDER — RAMIPRIL 2.5 MG PO CAPS: 2.5000 mg | ORAL_CAPSULE | Freq: Every day | ORAL | 4 refills | Status: DC

## 2016-09-23 ENCOUNTER — Encounter
Admission: RE | Admit: 2016-09-23 | Discharge: 2016-09-23 | Disposition: A | Discharge status: Home / Self Care | Payer: PPO | Source: Ambulatory Visit | Attending: Internal Medicine | Admitting: Internal Medicine

## 2016-09-23 DIAGNOSIS — S8251XD Displaced fracture of medial malleolus of right tibia, subsequent encounter for closed fracture with routine healing: Secondary | ICD-10-CM | POA: Diagnosis not present

## 2016-09-23 DIAGNOSIS — Z955 Presence of coronary angioplasty implant and graft: Secondary | ICD-10-CM | POA: Diagnosis not present

## 2016-09-23 DIAGNOSIS — I10 Essential (primary) hypertension: Secondary | ICD-10-CM | POA: Diagnosis not present

## 2016-09-23 DIAGNOSIS — R262 Difficulty in walking, not elsewhere classified: Secondary | ICD-10-CM | POA: Diagnosis not present

## 2016-09-23 DIAGNOSIS — E785 Hyperlipidemia, unspecified: Secondary | ICD-10-CM | POA: Diagnosis not present

## 2016-09-23 DIAGNOSIS — N401 Enlarged prostate with lower urinary tract symptoms: Secondary | ICD-10-CM | POA: Diagnosis not present

## 2016-09-23 DIAGNOSIS — Z7982 Long term (current) use of aspirin: Secondary | ICD-10-CM | POA: Diagnosis not present

## 2016-09-23 DIAGNOSIS — K219 Gastro-esophageal reflux disease without esophagitis: Secondary | ICD-10-CM | POA: Diagnosis not present

## 2016-09-23 DIAGNOSIS — Z7901 Long term (current) use of anticoagulants: Secondary | ICD-10-CM | POA: Diagnosis not present

## 2016-09-23 DIAGNOSIS — Z9181 History of falling: Secondary | ICD-10-CM | POA: Diagnosis not present

## 2016-09-23 DIAGNOSIS — I2510 Atherosclerotic heart disease of native coronary artery without angina pectoris: Secondary | ICD-10-CM | POA: Diagnosis not present

## 2016-09-23 DIAGNOSIS — Z87891 Personal history of nicotine dependence: Secondary | ICD-10-CM | POA: Diagnosis not present

## 2016-09-23 DIAGNOSIS — Z9079 Acquired absence of other genital organ(s): Secondary | ICD-10-CM | POA: Diagnosis not present

## 2016-09-23 DIAGNOSIS — M6281 Muscle weakness (generalized): Secondary | ICD-10-CM | POA: Diagnosis not present

## 2016-09-23 DIAGNOSIS — N138 Other obstructive and reflux uropathy: Secondary | ICD-10-CM | POA: Diagnosis not present

## 2016-09-27 DIAGNOSIS — M25571 Pain in right ankle and joints of right foot: Secondary | ICD-10-CM | POA: Diagnosis not present

## 2016-09-27 DIAGNOSIS — S8254XD Nondisplaced fracture of medial malleolus of right tibia, subsequent encounter for closed fracture with routine healing: Secondary | ICD-10-CM | POA: Diagnosis not present

## 2016-09-27 DIAGNOSIS — S93431D Sprain of tibiofibular ligament of right ankle, subsequent encounter: Secondary | ICD-10-CM | POA: Diagnosis not present

## 2016-10-04 ENCOUNTER — Other Ambulatory Visit: Payer: Self-pay | Admitting: Family Medicine

## 2016-10-05 ENCOUNTER — Other Ambulatory Visit: Payer: Self-pay | Admitting: Family Medicine

## 2016-10-05 MED ORDER — TRAMADOL HCL 50 MG PO TABS: 50.0000 mg | ORAL_TABLET | Freq: Three times a day (TID) | ORAL | 2 refills | Status: DC | PRN

## 2016-10-05 NOTE — Progress Notes (Signed)
Error

## 2016-10-05 NOTE — Telephone Encounter (Signed)
Please call in temazepam 

## 2016-10-05 NOTE — Telephone Encounter (Signed)
Rx called in to pharmacy. 

## 2016-10-07 DIAGNOSIS — S8251XD Displaced fracture of medial malleolus of right tibia, subsequent encounter for closed fracture with routine healing: Secondary | ICD-10-CM | POA: Diagnosis not present

## 2016-10-07 DIAGNOSIS — Z791 Long term (current) use of non-steroidal anti-inflammatories (NSAID): Secondary | ICD-10-CM | POA: Diagnosis not present

## 2016-10-07 DIAGNOSIS — C8590 Non-Hodgkin lymphoma, unspecified, unspecified site: Secondary | ICD-10-CM | POA: Diagnosis not present

## 2016-10-07 DIAGNOSIS — E785 Hyperlipidemia, unspecified: Secondary | ICD-10-CM | POA: Diagnosis not present

## 2016-10-07 DIAGNOSIS — G629 Polyneuropathy, unspecified: Secondary | ICD-10-CM | POA: Diagnosis not present

## 2016-10-07 DIAGNOSIS — I2510 Atherosclerotic heart disease of native coronary artery without angina pectoris: Secondary | ICD-10-CM | POA: Diagnosis not present

## 2016-10-07 DIAGNOSIS — Z951 Presence of aortocoronary bypass graft: Secondary | ICD-10-CM | POA: Diagnosis not present

## 2016-10-07 DIAGNOSIS — W19XXXD Unspecified fall, subsequent encounter: Secondary | ICD-10-CM | POA: Diagnosis not present

## 2016-10-07 DIAGNOSIS — I10 Essential (primary) hypertension: Secondary | ICD-10-CM | POA: Diagnosis not present

## 2016-10-07 DIAGNOSIS — Z7902 Long term (current) use of antithrombotics/antiplatelets: Secondary | ICD-10-CM | POA: Diagnosis not present

## 2016-10-07 DIAGNOSIS — N401 Enlarged prostate with lower urinary tract symptoms: Secondary | ICD-10-CM | POA: Diagnosis not present

## 2016-10-08 DIAGNOSIS — R339 Retention of urine, unspecified: Secondary | ICD-10-CM | POA: Diagnosis not present

## 2016-10-12 ENCOUNTER — Telehealth: Payer: Self-pay | Admitting: Family Medicine

## 2016-10-12 MED ORDER — ZOLPIDEM TARTRATE 5 MG PO TABS: 5.0000 mg | ORAL_TABLET | Freq: Every evening | ORAL | 0 refills | Status: DC | PRN

## 2016-10-12 NOTE — Telephone Encounter (Signed)
Rx called into pharmacy. [place] Pt was notified.

## 2016-10-12 NOTE — Telephone Encounter (Signed)
Please advise 

## 2016-10-12 NOTE — Telephone Encounter (Signed)
Pt broke his foot about a month ago.  Pt states he is unable to sleep.  He is requesting Ambien be called into [facility] on [address].

## 2016-10-13 NOTE — Telephone Encounter (Signed)
Patient states he went to [facility] and they did not have the Ambien RX. He would like this to be called in again, and notify him when it has been done.

## 2016-10-13 NOTE — Telephone Encounter (Signed)
Rx was called in to pharmacy yesterday. [facility] stated that they did not check vm until later in the day and therefore they did not fill rx until late. Rx is ready now. Patient was notified.

## 2016-10-14 ENCOUNTER — Ambulatory Visit: Payer: Self-pay

## 2016-10-15 ENCOUNTER — Ambulatory Visit: Payer: PPO

## 2016-10-15 DIAGNOSIS — R350 Frequency of micturition: Secondary | ICD-10-CM | POA: Diagnosis not present

## 2016-10-15 LAB — URINALYSIS, COMPLETE
Bilirubin, UA: NEGATIVE
GLUCOSE, UA: NEGATIVE
Ketones, UA: NEGATIVE
LEUKOCYTES UA: NEGATIVE
Nitrite, UA: NEGATIVE
PH UA: 5.5 (ref 5.0–7.5)
PROTEIN UA: NEGATIVE
RBC, UA: NEGATIVE
Specific Gravity, UA: 1.015 (ref 1.005–1.030)
UUROB: 0.2 mg/dL (ref 0.2–1.0)

## 2016-10-15 LAB — MICROSCOPIC EXAMINATION: BACTERIA UA: NONE SEEN

## 2016-10-15 NOTE — Progress Notes (Signed)
Patient dropped off urine today due to urinary frequency patient believes he may have a UTI. Patient was given a cath for [facility] for urine collection and a sterile container. UA today was clear but since patient is symptomatic will send for culture and will wait to treat based on those results. Patient is in agreement with this plan. Will call with culture results.

## 2016-10-18 ENCOUNTER — Telehealth: Payer: Self-pay

## 2016-10-18 DIAGNOSIS — W19XXXD Unspecified fall, subsequent encounter: Secondary | ICD-10-CM | POA: Diagnosis not present

## 2016-10-18 DIAGNOSIS — G629 Polyneuropathy, unspecified: Secondary | ICD-10-CM | POA: Diagnosis not present

## 2016-10-18 DIAGNOSIS — I2510 Atherosclerotic heart disease of native coronary artery without angina pectoris: Secondary | ICD-10-CM | POA: Diagnosis not present

## 2016-10-18 DIAGNOSIS — S8251XD Displaced fracture of medial malleolus of right tibia, subsequent encounter for closed fracture with routine healing: Secondary | ICD-10-CM | POA: Diagnosis not present

## 2016-10-18 DIAGNOSIS — E785 Hyperlipidemia, unspecified: Secondary | ICD-10-CM | POA: Diagnosis not present

## 2016-10-18 DIAGNOSIS — N401 Enlarged prostate with lower urinary tract symptoms: Secondary | ICD-10-CM | POA: Diagnosis not present

## 2016-10-18 DIAGNOSIS — Z951 Presence of aortocoronary bypass graft: Secondary | ICD-10-CM | POA: Diagnosis not present

## 2016-10-18 DIAGNOSIS — C8590 Non-Hodgkin lymphoma, unspecified, unspecified site: Secondary | ICD-10-CM | POA: Diagnosis not present

## 2016-10-18 DIAGNOSIS — Z791 Long term (current) use of non-steroidal anti-inflammatories (NSAID): Secondary | ICD-10-CM | POA: Diagnosis not present

## 2016-10-18 DIAGNOSIS — Z7902 Long term (current) use of antithrombotics/antiplatelets: Secondary | ICD-10-CM | POA: Diagnosis not present

## 2016-10-18 DIAGNOSIS — I10 Essential (primary) hypertension: Secondary | ICD-10-CM | POA: Diagnosis not present

## 2016-10-18 LAB — CULTURE, URINE COMPREHENSIVE

## 2016-10-18 NOTE — Telephone Encounter (Signed)
Patient notified that urine culture is negative, patient still complaining of increased frequency he would like an apt with a provider to discuss options. Patient was transferred to the front to have an apt made

## 2016-10-25 DIAGNOSIS — S93431D Sprain of tibiofibular ligament of right ankle, subsequent encounter: Secondary | ICD-10-CM | POA: Diagnosis not present

## 2016-10-25 DIAGNOSIS — S8254XD Nondisplaced fracture of medial malleolus of right tibia, subsequent encounter for closed fracture with routine healing: Secondary | ICD-10-CM | POA: Diagnosis not present

## 2016-10-25 DIAGNOSIS — M25571 Pain in right ankle and joints of right foot: Secondary | ICD-10-CM | POA: Diagnosis not present

## 2016-10-26 DIAGNOSIS — S8251XD Displaced fracture of medial malleolus of right tibia, subsequent encounter for closed fracture with routine healing: Secondary | ICD-10-CM | POA: Diagnosis not present

## 2016-10-26 DIAGNOSIS — Z7902 Long term (current) use of antithrombotics/antiplatelets: Secondary | ICD-10-CM | POA: Diagnosis not present

## 2016-10-26 DIAGNOSIS — W19XXXD Unspecified fall, subsequent encounter: Secondary | ICD-10-CM | POA: Diagnosis not present

## 2016-10-26 DIAGNOSIS — G629 Polyneuropathy, unspecified: Secondary | ICD-10-CM | POA: Diagnosis not present

## 2016-10-26 DIAGNOSIS — E785 Hyperlipidemia, unspecified: Secondary | ICD-10-CM | POA: Diagnosis not present

## 2016-10-26 DIAGNOSIS — C8590 Non-Hodgkin lymphoma, unspecified, unspecified site: Secondary | ICD-10-CM | POA: Diagnosis not present

## 2016-10-26 DIAGNOSIS — I10 Essential (primary) hypertension: Secondary | ICD-10-CM | POA: Diagnosis not present

## 2016-10-26 DIAGNOSIS — Z791 Long term (current) use of non-steroidal anti-inflammatories (NSAID): Secondary | ICD-10-CM | POA: Diagnosis not present

## 2016-10-26 DIAGNOSIS — I2510 Atherosclerotic heart disease of native coronary artery without angina pectoris: Secondary | ICD-10-CM | POA: Diagnosis not present

## 2016-10-26 DIAGNOSIS — N401 Enlarged prostate with lower urinary tract symptoms: Secondary | ICD-10-CM | POA: Diagnosis not present

## 2016-10-26 DIAGNOSIS — Z951 Presence of aortocoronary bypass graft: Secondary | ICD-10-CM | POA: Diagnosis not present

## 2016-11-02 DIAGNOSIS — I10 Essential (primary) hypertension: Secondary | ICD-10-CM | POA: Diagnosis not present

## 2016-11-02 DIAGNOSIS — I2510 Atherosclerotic heart disease of native coronary artery without angina pectoris: Secondary | ICD-10-CM | POA: Diagnosis not present

## 2016-11-02 DIAGNOSIS — G629 Polyneuropathy, unspecified: Secondary | ICD-10-CM | POA: Diagnosis not present

## 2016-11-02 DIAGNOSIS — Z951 Presence of aortocoronary bypass graft: Secondary | ICD-10-CM | POA: Diagnosis not present

## 2016-11-02 DIAGNOSIS — C8590 Non-Hodgkin lymphoma, unspecified, unspecified site: Secondary | ICD-10-CM | POA: Diagnosis not present

## 2016-11-02 DIAGNOSIS — Z7902 Long term (current) use of antithrombotics/antiplatelets: Secondary | ICD-10-CM | POA: Diagnosis not present

## 2016-11-02 DIAGNOSIS — S8251XD Displaced fracture of medial malleolus of right tibia, subsequent encounter for closed fracture with routine healing: Secondary | ICD-10-CM | POA: Diagnosis not present

## 2016-11-02 DIAGNOSIS — N401 Enlarged prostate with lower urinary tract symptoms: Secondary | ICD-10-CM | POA: Diagnosis not present

## 2016-11-02 DIAGNOSIS — E785 Hyperlipidemia, unspecified: Secondary | ICD-10-CM | POA: Diagnosis not present

## 2016-11-02 DIAGNOSIS — W19XXXD Unspecified fall, subsequent encounter: Secondary | ICD-10-CM | POA: Diagnosis not present

## 2016-11-02 DIAGNOSIS — Z791 Long term (current) use of non-steroidal anti-inflammatories (NSAID): Secondary | ICD-10-CM | POA: Diagnosis not present

## 2016-11-03 ENCOUNTER — Ambulatory Visit
Admission: RE | Admit: 2016-11-03 | Discharge: 2016-11-03 | Disposition: A | Discharge status: Home / Self Care | Payer: PPO | Source: Ambulatory Visit | Attending: Oncology | Admitting: Oncology

## 2016-11-03 DIAGNOSIS — N281 Cyst of kidney, acquired: Secondary | ICD-10-CM | POA: Insufficient documentation

## 2016-11-03 DIAGNOSIS — C8330 Diffuse large B-cell lymphoma, unspecified site: Secondary | ICD-10-CM | POA: Insufficient documentation

## 2016-11-03 DIAGNOSIS — I700 Atherosclerosis of aorta: Secondary | ICD-10-CM | POA: Insufficient documentation

## 2016-11-03 DIAGNOSIS — N400 Enlarged prostate without lower urinary tract symptoms: Secondary | ICD-10-CM | POA: Diagnosis not present

## 2016-11-03 LAB — POCT I-STAT CREATININE: CREATININE: 1.1 mg/dL (ref 0.61–1.24)

## 2016-11-03 MED ORDER — IOPAMIDOL (ISOVUE-300) INJECTION 61%
100.0000 mL | Freq: Once | INTRAVENOUS | Status: AC | PRN
  Administered 2016-11-03: 100 mL via INTRAVENOUS

## 2016-11-04 DIAGNOSIS — X32XXXA Exposure to sunlight, initial encounter: Secondary | ICD-10-CM | POA: Diagnosis not present

## 2016-11-04 DIAGNOSIS — L821 Other seborrheic keratosis: Secondary | ICD-10-CM | POA: Diagnosis not present

## 2016-11-04 DIAGNOSIS — L570 Actinic keratosis: Secondary | ICD-10-CM | POA: Diagnosis not present

## 2016-11-05 DIAGNOSIS — N401 Enlarged prostate with lower urinary tract symptoms: Secondary | ICD-10-CM | POA: Diagnosis not present

## 2016-11-05 DIAGNOSIS — Z7902 Long term (current) use of antithrombotics/antiplatelets: Secondary | ICD-10-CM | POA: Diagnosis not present

## 2016-11-05 DIAGNOSIS — Z791 Long term (current) use of non-steroidal anti-inflammatories (NSAID): Secondary | ICD-10-CM | POA: Diagnosis not present

## 2016-11-05 DIAGNOSIS — W19XXXD Unspecified fall, subsequent encounter: Secondary | ICD-10-CM | POA: Diagnosis not present

## 2016-11-05 DIAGNOSIS — G629 Polyneuropathy, unspecified: Secondary | ICD-10-CM | POA: Diagnosis not present

## 2016-11-05 DIAGNOSIS — C8590 Non-Hodgkin lymphoma, unspecified, unspecified site: Secondary | ICD-10-CM | POA: Diagnosis not present

## 2016-11-05 DIAGNOSIS — I2510 Atherosclerotic heart disease of native coronary artery without angina pectoris: Secondary | ICD-10-CM | POA: Diagnosis not present

## 2016-11-05 DIAGNOSIS — S8251XD Displaced fracture of medial malleolus of right tibia, subsequent encounter for closed fracture with routine healing: Secondary | ICD-10-CM | POA: Diagnosis not present

## 2016-11-05 DIAGNOSIS — Z951 Presence of aortocoronary bypass graft: Secondary | ICD-10-CM | POA: Diagnosis not present

## 2016-11-05 DIAGNOSIS — I10 Essential (primary) hypertension: Secondary | ICD-10-CM | POA: Diagnosis not present

## 2016-11-05 DIAGNOSIS — E785 Hyperlipidemia, unspecified: Secondary | ICD-10-CM | POA: Diagnosis not present

## 2016-11-07 NOTE — Progress Notes (Signed)
[facility]  [phone]  [phone]     Bryan Nelson DOB: 07-30-1932  MR#: [id number]  HWK#:[id number]  Patient Care Team: [name], MD as PCP - General (Family Medicine) Forest Gleason, MD (Unknown Physician Specialty) Christene Lye, MD (General Surgery) Collier Flowers, MD (Urology)  CHIEF COMPLAINT: Diffuse large B-cell lymphoma.  INTERVAL HISTORY: Patient returns to clinic today for routine follow-up and discussion of his imaging results. He currently feels well and is asymptomatic. He has no neurologic complaints. He denies any recent fevers or illnesses. He has no night sweats. He denies any chest pain or shortness of breath. He has no nausea, vomiting, constipation, or diarrhea. He has no urinary complaints today. He offers no specific complaints today.   REVIEW OF SYSTEMS:   Review of Systems  Constitutional: Negative.  Negative for fever, malaise/fatigue and weight loss.  Respiratory: Negative.  Negative for cough and shortness of breath.   Cardiovascular: Negative.  Negative for chest pain and leg swelling.  Gastrointestinal: Negative.  Negative for abdominal pain.  Genitourinary: Negative.  Negative for frequency and urgency.  Musculoskeletal: Positive for joint pain.  Neurological: Negative.   Psychiatric/Behavioral: Negative.  The patient is not nervous/anxious.     As per HPI. Otherwise, a complete review of systems is negative.  ONCOLOGY HISTORY:  No history exists.    PAST MEDICAL HISTORY: Past Medical History:  Diagnosis Date  . Arteriosclerosis of coronary artery 08/26/2011   Overview:      a.  1999 PCI of the mid LAD with stent.      b.  2002 Cath [facility]: EF 56%. RCA-distal 25/25%. Left main-50% ostial.  Left circumflex-25% OM2.  LAD-25% proximal.  75% mid.  25/25% distal. 75% D1.      c.  07/2011 PCI of RCA with DES.[place]   . Bleeding gastrointestinal 08/26/2011   Overview:      a.  Chronic abdominal pain,  present improving.      b.  Duodenitis and gastritis by EGD in 2000.      c.  Pylori in 2000, treated.      d.  Gastroesophageal reflux disease.      e.  Diverticular disease.    . BP (high blood pressure) 08/26/2011  . Cancer ([place]) 2015   Lymphoma  . Heart disease   . Helicobacter pylori infection 06/18/2015   by EGD RX 08/18/1999   . History of adenomatous polyp of colon 06/18/2015    PAST SURGICAL HISTORY: Past Surgical History:  Procedure Laterality Date  . ANGIOPLASTY    . CATARACT EXTRACTION    . GREEN LIGHT LASER TURP (TRANSURETHRAL RESECTION OF PROSTATE N/A 04/16/2015   Procedure: GREEN LIGHT LASER TURP (TRANSURETHRAL RESECTION OF PROSTATE WITH BLADDER BIOPSY;  Surgeon: Collier Flowers, MD;  Location: [facility] ORS;  Service: Urology;  Laterality: N/A;  . heart stent placement     1998, 2000, 2012    FAMILY HISTORY Family History  Problem Relation Age of Onset  . Osteoporosis Mother   . Alzheimer's disease Father   . Kidney disease Neg Hx   . Prostate cancer Neg Hx    AVANCED DIRECTIVES:    HEALTH MAINTENANCE: Social History  Substance Use Topics  . Smoking status: Former Smoker    Packs/day: 1.50    Years: 12.00    Types: Cigarettes    Quit date: 08/23/1981  . Smokeless tobacco: Never Used  . Alcohol use No    Allergies  Allergen Reactions  . Ciprofloxacin Diarrhea  . Lisinopril Other (See Comments)  . Penicillins     unknwon reaction.  tolerates amoxicillin  . Sulfa Antibiotics Itching and Rash    Rash    Current Outpatient Prescriptions  Medication Sig Dispense Refill  . aspirin 81 MG tablet Take 81 mg by mouth daily.    Marland Kitchen atorvastatin (LIPITOR) 10 MG tablet Take 1 tablet (10 mg total) by mouth daily. 90 tablet 4  . clopidogrel (PLAVIX) 75 MG tablet Take 1 tablet (75 mg total) by mouth daily. 90 tablet 4  . hydrochlorothiazide (HYDRODIURIL) 25 MG tablet Take 1 tablet (25 mg total) by mouth daily. 90 tablet 4  . isosorbide dinitrate (ISORDIL) 30 MG tablet  Take 1 tablet (30 mg total) by mouth every morning. 90 tablet 4  . metoprolol (LOPRESSOR) 100 MG tablet Take 1 tablet (100 mg total) by mouth 2 (two) times daily. 180 tablet 4  . nitroGLYCERIN (NITROSTAT) 0.4 MG SL tablet Place 0.4 mg under the tongue every 5 (five) minutes as needed for chest pain. Reported on 03/10/2016    . omeprazole (PRILOSEC) 40 MG capsule Take 1 capsule (40 mg total) by mouth 2 (two) times daily. 180 capsule 4  . ramipril (ALTACE) 2.5 MG capsule Take 1 capsule (2.5 mg total) by mouth daily. 90 capsule 4  . ranitidine (ZANTAC) 150 MG tablet Take 1 tablet (150 mg total) by mouth 2 (two) times daily. Reported on 03/10/2016 180 tablet 4  . sucralfate (CARAFATE) 1 G tablet Take 1 g by mouth 2 (two) times daily.     Current Facility-Administered Medications  Medication Dose Route Frequency Provider Last Rate Last Dose  . lidocaine (XYLOCAINE) 2 % jelly 1 application  1 application Urethral Once Collier Flowers, MD        OBJECTIVE: BP 108/67 (BP Location: Left Arm, Patient Position: Sitting)   Pulse (!) 56   Temp (!) 96.4 F (35.8 C) (Tympanic)   Resp 18    There is no height or weight on file to calculate BMI.    ECOG FS:0 - Asymptomatic  General: Well-developed, well-nourished, no acute distress. Eyes: Pink conjunctiva, anicteric sclera. Lungs: Clear to auscultation bilaterally. Heart: Regular rate and rhythm. No rubs, murmurs, or gallops. Abdomen: Soft, nontender, nondistended. No organomegaly noted, normoactive bowel sounds. Musculoskeletal: No edema, cyanosis, or clubbing. Neuro: Alert, answering all questions appropriately. Cranial nerves grossly intact. Skin: No rashes or petechiae noted. Psych: Normal affect. Lymphatics: No cervical, calvicular, axillary  LAD.   LAB RESULTS:  Appointment on 11/08/2016  Component Date Value Ref Range Status  . WBC 11/08/2016 5.1  3.8 - 10.6 K/uL Final  . RBC 11/08/2016 4.36* 4.40 - 5.90 MIL/uL Final  . Hemoglobin 11/08/2016  13.0  13.0 - 18.0 g/dL Final  . HCT 11/08/2016 37.7* 40.0 - 52.0 % Final  . MCV 11/08/2016 86.4  80.0 - 100.0 fL Final  . MCH 11/08/2016 29.8  26.0 - 34.0 pg Final  . MCHC 11/08/2016 34.5  32.0 - 36.0 g/dL Final  . RDW 11/08/2016 16.9* 11.5 - 14.5 % Final  . Platelets 11/08/2016 212  150 - 440 K/uL Final  . Neutrophils Relative % 11/08/2016 57  % Final  . Neutro Abs 11/08/2016 2.9  1.4 - 6.5 K/uL Final  . Lymphocytes Relative 11/08/2016 20  % Final  . Lymphs Abs 11/08/2016 1.0  1.0 - 3.6 K/uL Final  . Monocytes Relative 11/08/2016 13  % Final  . Monocytes Absolute 11/08/2016  0.6  0.2 - 1.0 K/uL Final  . Eosinophils Relative 11/08/2016 9  % Final  . Eosinophils Absolute 11/08/2016 0.5  0 - 0.7 K/uL Final  . Basophils Relative 11/08/2016 1  % Final  . Basophils Absolute 11/08/2016 0.0  0 - 0.1 K/uL Final  . Sodium 11/08/2016 137  135 - 145 mmol/L Final  . Potassium 11/08/2016 3.2* 3.5 - 5.1 mmol/L Final  . Chloride 11/08/2016 102  101 - 111 mmol/L Final  . CO2 11/08/2016 27  22 - 32 mmol/L Final  . Glucose, Bld 11/08/2016 104* 65 - 99 mg/dL Final  . BUN 11/08/2016 22* 6 - 20 mg/dL Final  . Creatinine, Ser 11/08/2016 1.17  0.61 - 1.24 mg/dL Final  . Calcium 11/08/2016 8.9  8.9 - 10.3 mg/dL Final  . Total Protein 11/08/2016 6.8  6.5 - 8.1 g/dL Final  . Albumin 11/08/2016 3.9  3.5 - 5.0 g/dL Final  . AST 11/08/2016 23  15 - 41 U/L Final  . ALT 11/08/2016 24  17 - 63 U/L Final  . Alkaline Phosphatase 11/08/2016 60  38 - 126 U/L Final  . Total Bilirubin 11/08/2016 0.6  0.3 - 1.2 mg/dL Final  . GFR calc non Af Amer 11/08/2016 56* >60 mL/min Final  . GFR calc Af Amer 11/08/2016 >60  >60 mL/min Final   Comment: (NOTE) The eGFR has been calculated using the CKD EPI equation. This calculation has not been validated in all clinical situations. eGFR's persistently <60 mL/min signify possible Chronic Kidney Disease.   . Anion gap 11/08/2016 8  5 - 15 Final  . LDH 11/08/2016 146  98 - 192 U/L  Final    STUDIES: No results found.   ASSESSMENT & PLAN:    1. Diffuse Large B Cell Lymphoma: Patient initially diagnosed in August 2015. 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 He completed 6 cycles of R-CHOP in January 2016. PET scan in January 2017 revealed no evidence of disease. CT scan of the chest, abdomen, and pelvis on November 03, 2016 reviewed independently revealed no evidence of disease. Now that patient is over 2 years removed from treatment, can continue with evaluation every 6 months and yearly imaging. Return to clinic in 6 months for further evaluation.   Patient expressed understanding and was in agreement with this plan. He also understands that He can call clinic at any time with any questions, concerns, or complaints.    Lloyd Huger, MD   11/13/2016 7:42 PM

## 2016-11-08 ENCOUNTER — Inpatient Hospital Stay (HOSPITAL_BASED_OUTPATIENT_CLINIC_OR_DEPARTMENT_OTHER): Payer: PPO | Admitting: Oncology

## 2016-11-08 ENCOUNTER — Inpatient Hospital Stay: Payer: PPO | Attending: Oncology

## 2016-11-08 VITALS — BP 108/67 | HR 56 | Temp 96.4°F | Resp 18

## 2016-11-08 DIAGNOSIS — K219 Gastro-esophageal reflux disease without esophagitis: Secondary | ICD-10-CM | POA: Insufficient documentation

## 2016-11-08 DIAGNOSIS — Z8619 Personal history of other infectious and parasitic diseases: Secondary | ICD-10-CM | POA: Insufficient documentation

## 2016-11-08 DIAGNOSIS — Z8572 Personal history of non-Hodgkin lymphomas: Secondary | ICD-10-CM

## 2016-11-08 DIAGNOSIS — Z87891 Personal history of nicotine dependence: Secondary | ICD-10-CM

## 2016-11-08 DIAGNOSIS — C8330 Diffuse large B-cell lymphoma, unspecified site: Secondary | ICD-10-CM

## 2016-11-08 DIAGNOSIS — Z86010 Personal history of colonic polyps: Secondary | ICD-10-CM

## 2016-11-08 DIAGNOSIS — Z7982 Long term (current) use of aspirin: Secondary | ICD-10-CM | POA: Diagnosis not present

## 2016-11-08 DIAGNOSIS — Z9221 Personal history of antineoplastic chemotherapy: Secondary | ICD-10-CM | POA: Diagnosis not present

## 2016-11-08 DIAGNOSIS — M2550 Pain in unspecified joint: Secondary | ICD-10-CM | POA: Insufficient documentation

## 2016-11-08 DIAGNOSIS — Z79899 Other long term (current) drug therapy: Secondary | ICD-10-CM | POA: Diagnosis not present

## 2016-11-08 DIAGNOSIS — I2510 Atherosclerotic heart disease of native coronary artery without angina pectoris: Secondary | ICD-10-CM | POA: Diagnosis not present

## 2016-11-08 DIAGNOSIS — R339 Retention of urine, unspecified: Secondary | ICD-10-CM | POA: Diagnosis not present

## 2016-11-08 LAB — COMPREHENSIVE METABOLIC PANEL
ALT: 24 U/L (ref 17–63)
ANION GAP: 8 (ref 5–15)
AST: 23 U/L (ref 15–41)
Albumin: 3.9 g/dL (ref 3.5–5.0)
Alkaline Phosphatase: 60 U/L (ref 38–126)
BUN: 22 mg/dL — ABNORMAL HIGH (ref 6–20)
CHLORIDE: 102 mmol/L (ref 101–111)
CO2: 27 mmol/L (ref 22–32)
Calcium: 8.9 mg/dL (ref 8.9–10.3)
Creatinine, Ser: 1.17 mg/dL (ref 0.61–1.24)
GFR, EST NON AFRICAN AMERICAN: 56 mL/min — AB (ref >60)
Glucose, Bld: 104 mg/dL — ABNORMAL HIGH (ref 65–99)
Potassium: 3.2 mmol/L — ABNORMAL LOW (ref 3.5–5.1)
SODIUM: 137 mmol/L (ref 135–145)
Total Bilirubin: 0.6 mg/dL (ref 0.3–1.2)
Total Protein: 6.8 g/dL (ref 6.5–8.1)

## 2016-11-08 LAB — CBC WITH DIFFERENTIAL/PLATELET
Basophils Absolute: 0 10*3/uL (ref 0–0.1)
Basophils Relative: 1 %
EOS ABS: 0.5 10*3/uL (ref 0–0.7)
EOS PCT: 9 %
HCT: 37.7 % — ABNORMAL LOW (ref 40.0–52.0)
Hemoglobin: 13 g/dL (ref 13.0–18.0)
LYMPHS ABS: 1 10*3/uL (ref 1.0–3.6)
Lymphocytes Relative: 20 %
MCH: 29.8 pg (ref 26.0–34.0)
MCHC: 34.5 g/dL (ref 32.0–36.0)
MCV: 86.4 fL (ref 80.0–100.0)
MONO ABS: 0.6 10*3/uL (ref 0.2–1.0)
MONOS PCT: 13 %
Neutro Abs: 2.9 10*3/uL (ref 1.4–6.5)
Neutrophils Relative %: 57 %
PLATELETS: 212 10*3/uL (ref 150–440)
RBC: 4.36 MIL/uL — ABNORMAL LOW (ref 4.40–5.90)
RDW: 16.9 % — AB (ref 11.5–14.5)
WBC: 5.1 10*3/uL (ref 3.8–10.6)

## 2016-11-08 LAB — LACTATE DEHYDROGENASE: LDH: 146 U/L (ref 98–192)

## 2016-11-08 NOTE — Progress Notes (Signed)
Offers no complaints. States is feeling well. 

## 2016-11-09 DIAGNOSIS — G629 Polyneuropathy, unspecified: Secondary | ICD-10-CM | POA: Diagnosis not present

## 2016-11-09 DIAGNOSIS — Z791 Long term (current) use of non-steroidal anti-inflammatories (NSAID): Secondary | ICD-10-CM | POA: Diagnosis not present

## 2016-11-09 DIAGNOSIS — Z951 Presence of aortocoronary bypass graft: Secondary | ICD-10-CM | POA: Diagnosis not present

## 2016-11-09 DIAGNOSIS — C8590 Non-Hodgkin lymphoma, unspecified, unspecified site: Secondary | ICD-10-CM | POA: Diagnosis not present

## 2016-11-09 DIAGNOSIS — W19XXXD Unspecified fall, subsequent encounter: Secondary | ICD-10-CM | POA: Diagnosis not present

## 2016-11-09 DIAGNOSIS — N401 Enlarged prostate with lower urinary tract symptoms: Secondary | ICD-10-CM | POA: Diagnosis not present

## 2016-11-09 DIAGNOSIS — S8251XD Displaced fracture of medial malleolus of right tibia, subsequent encounter for closed fracture with routine healing: Secondary | ICD-10-CM | POA: Diagnosis not present

## 2016-11-09 DIAGNOSIS — E785 Hyperlipidemia, unspecified: Secondary | ICD-10-CM | POA: Diagnosis not present

## 2016-11-09 DIAGNOSIS — I10 Essential (primary) hypertension: Secondary | ICD-10-CM | POA: Diagnosis not present

## 2016-11-09 DIAGNOSIS — I2510 Atherosclerotic heart disease of native coronary artery without angina pectoris: Secondary | ICD-10-CM | POA: Diagnosis not present

## 2016-11-09 DIAGNOSIS — Z7902 Long term (current) use of antithrombotics/antiplatelets: Secondary | ICD-10-CM | POA: Diagnosis not present

## 2016-11-12 DIAGNOSIS — E785 Hyperlipidemia, unspecified: Secondary | ICD-10-CM | POA: Diagnosis not present

## 2016-11-12 DIAGNOSIS — S8251XD Displaced fracture of medial malleolus of right tibia, subsequent encounter for closed fracture with routine healing: Secondary | ICD-10-CM | POA: Diagnosis not present

## 2016-11-12 DIAGNOSIS — C8590 Non-Hodgkin lymphoma, unspecified, unspecified site: Secondary | ICD-10-CM | POA: Diagnosis not present

## 2016-11-12 DIAGNOSIS — G629 Polyneuropathy, unspecified: Secondary | ICD-10-CM | POA: Diagnosis not present

## 2016-11-12 DIAGNOSIS — Z7902 Long term (current) use of antithrombotics/antiplatelets: Secondary | ICD-10-CM | POA: Diagnosis not present

## 2016-11-12 DIAGNOSIS — Z791 Long term (current) use of non-steroidal anti-inflammatories (NSAID): Secondary | ICD-10-CM | POA: Diagnosis not present

## 2016-11-12 DIAGNOSIS — I10 Essential (primary) hypertension: Secondary | ICD-10-CM | POA: Diagnosis not present

## 2016-11-12 DIAGNOSIS — I2510 Atherosclerotic heart disease of native coronary artery without angina pectoris: Secondary | ICD-10-CM | POA: Diagnosis not present

## 2016-11-12 DIAGNOSIS — N401 Enlarged prostate with lower urinary tract symptoms: Secondary | ICD-10-CM | POA: Diagnosis not present

## 2016-11-12 DIAGNOSIS — W19XXXD Unspecified fall, subsequent encounter: Secondary | ICD-10-CM | POA: Diagnosis not present

## 2016-11-12 DIAGNOSIS — Z951 Presence of aortocoronary bypass graft: Secondary | ICD-10-CM | POA: Diagnosis not present

## 2016-11-17 NOTE — Progress Notes (Signed)
9:16 AM   Bryan Nelson [birth year] [id number]  Referring provider: [name], MD [address] [place], [place] [address]  Chief Complaint  Patient presents with  . Urinary Frequency    last seen in October should follow up in one year patient requested this appointment    HPI: 83 yo WM with a history of recurrent UTI's, BPH with LUTS and neurogenic bladder who presents today for evaluation of his urinary frequency.  BPH WITH RETENTION His IPSS score today is 25, which is severe lower urinary tract symptomatology. He is unhappy with his quality life due to his urinary symptoms. His PVR is 269mL.  His major complaint today he is unable to pass his urine without cathing himself.  He has had these symptoms for the last several months.  He has been managing his bladder with CIC in addition to spontaneous voiding.  He denies any dysuria, hematuria or suprapubic pain.   His has had KTP about 3 years ago.  He also denies any recent fevers, chills, nausea or vomiting.  He states he has the urgency to void 5 to 6 times during the day and 4 times at night.  He tries to urinate on his own, but he can only get out a few drops.  He then has to cath.  He has not been measuring his PVR's.    He is currently using a new product from [employer], [place] cath, and so far has had success with this catheter.   He is not experiencing symptoms of urinary tract infection at this time.      IPSS    Row Name 11/18/16 0800         International Prostate Symptom Score   How often have you had the sensation of not emptying your bladder? Not at All     How often have you had to urinate less than every two hours? Almost always     How often have you found you stopped and started again several times when you urinated? Almost always     How often have you found it difficult to postpone urination? Almost always     How often have you had a weak urinary stream? Almost always     How often  have you had to strain to start urination? Not at All     How many times did you typically get up at night to urinate? 5 Times     Total IPSS Score 25       Quality of Life due to urinary symptoms   If you were to spend the rest of your life with your urinary condition just the way it is now how would you feel about that? Unhappy        Score:  1-7 Mild 8-19 Moderate 20-35 Severe  History of recurrent UTI's Patient has not had a documented UTI's in the last year.    Neurogenic bladder Patient is managing his bladder with CIC.   PMH: Past Medical History:  Diagnosis Date  . Arteriosclerosis of coronary artery 08/26/2011   Overview:      a.  1999 PCI of the mid LAD with stent.      b.  2002 Cath [place]: EF 56%. RCA-distal 25/25%. Left main-50% ostial.  Left circumflex-25% OM2.  LAD-25% proximal.  75% mid.  25/25% distal. 75% D1.      c.  07/2011 PCI of RCA with DES.[place]   . Bleeding gastrointestinal  08/26/2011   Overview:      a.  Chronic abdominal pain, present improving.      b.  Duodenitis and gastritis by EGD in 2000.      c.  Pylori in 2000, treated.      d.  Gastroesophageal reflux disease.      e.  Diverticular disease.    . BP (high blood pressure) 08/26/2011  . Cancer ([place]) 2015   Lymphoma  . Heart disease   . Helicobacter pylori infection 06/18/2015   by EGD RX 08/18/1999   . History of adenomatous polyp of colon 06/18/2015    Surgical History: Past Surgical History:  Procedure Laterality Date  . ANGIOPLASTY    . CATARACT EXTRACTION    . GREEN LIGHT LASER TURP (TRANSURETHRAL RESECTION OF PROSTATE N/A 04/16/2015   Procedure: GREEN LIGHT LASER TURP (TRANSURETHRAL RESECTION OF PROSTATE WITH BLADDER BIOPSY;  Surgeon: Collier Flowers, MD;  Location: [facility] ORS;  Service: Urology;  Laterality: N/A;  . heart stent placement     1998, 2000, 2012    Home Medications:  Allergies as of 11/18/2016      Reactions   Ciprofloxacin Diarrhea   Lisinopril Other (See Comments)    Penicillins    unknwon reaction.  tolerates amoxicillin   Sulfa Antibiotics Itching, Rash   Rash      Medication List       Accurate as of 11/18/16  9:16 AM. Always use your most recent med list.          aspirin 81 MG tablet Take 81 mg by mouth daily.   atorvastatin 10 MG tablet Commonly known as:  LIPITOR Take 1 tablet (10 mg total) by mouth daily.   clopidogrel 75 MG tablet Commonly known as:  PLAVIX Take 1 tablet (75 mg total) by mouth daily.   finasteride 5 MG tablet Commonly known as:  PROSCAR Take 1 tablet (5 mg total) by mouth daily.   hydrochlorothiazide 25 MG tablet Commonly known as:  HYDRODIURIL Take 1 tablet (25 mg total) by mouth daily.   isosorbide dinitrate 30 MG tablet Commonly known as:  ISORDIL Take 1 tablet (30 mg total) by mouth every morning.   metoprolol 100 MG tablet Commonly known as:  LOPRESSOR Take 1 tablet (100 mg total) by mouth 2 (two) times daily.   nitroGLYCERIN 0.4 MG SL tablet Commonly known as:  NITROSTAT Place 0.4 mg under the tongue every 5 (five) minutes as needed for chest pain. Reported on 03/10/2016   omeprazole 40 MG capsule Commonly known as:  PRILOSEC Take 1 capsule (40 mg total) by mouth 2 (two) times daily.   ramipril 2.5 MG capsule Commonly known as:  ALTACE Take 1 capsule (2.5 mg total) by mouth daily.   ranitidine 150 MG tablet Commonly known as:  ZANTAC Take 1 tablet (150 mg total) by mouth 2 (two) times daily. Reported on 03/10/2016   sucralfate 1 g tablet Commonly known as:  CARAFATE Take 1 g by mouth 2 (two) times daily.   tamsulosin 0.4 MG Caps capsule Commonly known as:  FLOMAX Take 1 capsule (0.4 mg total) by mouth daily.       Allergies:  Allergies  Allergen Reactions  . Ciprofloxacin Diarrhea  . Lisinopril Other (See Comments)  . Penicillins     unknwon reaction.  tolerates amoxicillin  . Sulfa Antibiotics Itching and Rash    Rash    Family History: Family History  Problem Relation  Age of Onset  .  Osteoporosis Mother   . Alzheimer's disease Father   . Kidney disease Neg Hx   . Prostate cancer Neg Hx   . Bladder Cancer Neg Hx   . Kidney cancer Neg Hx     Social History:  reports that he quit smoking about 35 years ago. His smoking use included Cigarettes. He has a 18.00 pack-year smoking history. He has never used smokeless tobacco. He reports that he does not drink alcohol or use drugs.  ROS: UROLOGY Frequent Urination?: Yes Hard to postpone urination?: No Burning/pain with urination?: No Get up at night to urinate?: No Leakage of urine?: No Urine stream starts and stops?: No Trouble starting stream?: No Do you have to strain to urinate?: No Blood in urine?: No Urinary tract infection?: No Sexually transmitted disease?: No Injury to kidneys or bladder?: No Painful intercourse?: No Weak stream?: No Erection problems?: No Penile pain?: No  Gastrointestinal Nausea?: No Vomiting?: No Indigestion/heartburn?: No Diarrhea?: No Constipation?: No  Constitutional Fever: No Night sweats?: No Weight loss?: No Fatigue?: No  Skin Skin rash/lesions?: No Itching?: No  Eyes Blurred vision?: No Double vision?: No  Ears/Nose/Throat Sore throat?: No Sinus problems?: No  Physical Exam: BP (!) 141/68   Pulse 61   Ht 6' (1.829 m)   Wt 252 lb 4.8 oz (114.4 kg)   BMI 34.22 kg/m   Constitutional: Well nourished. Alert and oriented, No acute distress. HEENT: [place] AT, moist mucus membranes. Trachea midline, no masses. Cardiovascular: No clubbing, cyanosis, or edema. Respiratory: Normal respiratory effort, no increased work of breathing. GI: Abdomen is soft, non tender, non distended, no abdominal masses. Liver and spleen not palpable.  No hernias appreciated.  Stool sample for occult testing is not indicated.   GU: No CVA tenderness.  No bladder fullness or masses.  Patient with circumcised phallus.  Urethral meatus is patent.  No penile discharge. No  penile lesions or rashes. Scrotum without lesions, cysts, rashes and/or edema.  Testicles are located scrotally bilaterally. No masses are appreciated in the testicles. Left and right epididymis are normal. Rectal: Patient with  normal sphincter tone. Anus and perineum without scarring or rashes. No rectal masses are appreciated. Prostate is approximately 60 + grams, no nodules are appreciated. Seminal vesicles are normal. Skin: No rashes, bruises or suspicious lesions. Lymph: No cervical or inguinal adenopathy. Neurologic: Grossly intact, no focal deficits, moving all 4 extremities. Psychiatric: Normal mood and affect.  Laboratory Data: Lab Results  Component Value Date   WBC 5.1 11/08/2016   HGB 13.0 11/08/2016   HCT 37.7 (L) 11/08/2016   MCV 86.4 11/08/2016   PLT 212 11/08/2016    Lab Results  Component Value Date   CREATININE 1.17 11/08/2016    Lab Results  Component Value Date   PSA 0.9 02/27/2014     Lab Results  Component Value Date   AST 23 11/08/2016   Lab Results  Component Value Date   ALT 24 11/08/2016     Results for orders placed or performed in visit on 11/18/16  BLADDER SCAN AMB NON-IMAGING  Result Value Ref Range   Scan Result 207      Assessment & Plan:    1. Frequency  - continue CIC  - restart tamsulosin and finasteride   2. Urinary retention  - Patient caths several times daily, indefinitely due to urinary retention  - Patient requires [place], [place] cath  - will restart tamsulosin and finasteride  - He will RTC in one year for IPSS  and PVR  3. History of recurrent UTI's  - patient has not had an UTI since 10/31/2015  - reviewed UTI prevention  - reminded patient to contact us for symptoms of an UTI  - RTC in one year for a recheck  4. Nocturia  - I explained to the patient that nocturia is often multi-factorial and difficult to treat.  Sleeping disorders, heart conditions, peripheral vascular disease, diabetes, an enlarged  prostate for men, an urethral stricture causing bladder outlet obstruction and/or certain medications can contribute to nocturia.  - I have suggested that the patient avoid caffeine after noon and alcohol in the evening.  He or she may also benefit from fluid restrictions after 6:00 in the evening and voiding just prior to bedtime.  - I have explained that research studies have showed that over 84% of patients with sleep apnea reported frequent nighttime urination.   With sleep apnea, oxygen decreases, carbon dioxide increases, the blood become more acidic, the heart rate drops and blood vessels in the lung constrict.  The body is then alerted that something is very wrong. The sleeper must wake enough to reopen the airway. By this time, the heart is racing and experiences a false signal of fluid overload. The heart excretes a hormone-like protein that tells the body to get rid of sodium and water, resulting in nocturia.  -  I also informed the patient that a recent study noted that decreasing sodium intake to 2.3 grams daily, if they don't have issues with hyponatremia, can also reduce the number of nightly voids  - The patient may benefit from a discussion with his or her primary care physician to see if he or she has risk factors for sleep apnea or other sleep disturbances and obtaining a sleep study.  5. Neurogenic bladder  - patient is frustrated with his bladder situation and having to catheterize himself - he doesn't understand why the KTP didn't fix the problem  - explained the pathophysiology of a neurogenic bladder and offered the patient UDS to see if the bladder has any function at this time - he would like to think about    Return in about 1 year (around 11/18/2017) for IPSS, PVR and exam.  These notes generated with voice recognition software. I apologize for typographical errors.  Zara Council, [facility] [address], [place], [place]  [address] [phone]

## 2016-11-18 ENCOUNTER — Ambulatory Visit: Payer: PPO | Admitting: Urology

## 2016-11-18 ENCOUNTER — Encounter: Payer: Self-pay | Admitting: Urology

## 2016-11-18 VITALS — BP 141/68 | HR 61 | Ht 72.0 in | Wt 252.3 lb

## 2016-11-18 DIAGNOSIS — R350 Frequency of micturition: Secondary | ICD-10-CM | POA: Diagnosis not present

## 2016-11-18 DIAGNOSIS — Z87440 Personal history of urinary (tract) infections: Secondary | ICD-10-CM | POA: Diagnosis not present

## 2016-11-18 DIAGNOSIS — R339 Retention of urine, unspecified: Secondary | ICD-10-CM | POA: Diagnosis not present

## 2016-11-18 DIAGNOSIS — R351 Nocturia: Secondary | ICD-10-CM

## 2016-11-18 LAB — BLADDER SCAN AMB NON-IMAGING: SCAN RESULT: 207

## 2016-11-18 MED ORDER — TAMSULOSIN HCL 0.4 MG PO CAPS: 0.4000 mg | ORAL_CAPSULE | Freq: Every day | ORAL | 3 refills | Status: DC

## 2016-11-18 MED ORDER — FINASTERIDE 5 MG PO TABS: 5.0000 mg | ORAL_TABLET | Freq: Every day | ORAL | 3 refills | Status: DC

## 2016-11-18 NOTE — Addendum Note (Signed)
Addended by: Toniann Fail C on: 11/18/2016 11:06 AM   Modules accepted: Orders

## 2016-11-22 LAB — BASIC METABOLIC PANEL
BUN: 15 mg/dL (ref 4–21)
CREATININE: 1.2 mg/dL (ref 0.6–1.3)
Glucose: 139 mg/dL
Potassium: 3.7 mmol/L (ref 3.4–5.3)

## 2016-11-22 LAB — MICROALBUMIN, URINE: Microalb, Ur: 28

## 2016-11-22 LAB — LIPID PANEL
Cholesterol: 147 mg/dL (ref 0–200)
HDL: 39 mg/dL (ref 35–70)
LDL Cholesterol: 69 mg/dL
Triglycerides: 196 mg/dL — AB (ref 40–160)

## 2016-11-22 LAB — PSA: PSA: 0.81

## 2016-11-22 LAB — TSH: TSH: 2.41 u[IU]/mL (ref 0.41–5.90)

## 2016-11-22 LAB — HEMOGLOBIN A1C: HEMOGLOBIN A1C: 5.9

## 2016-11-22 LAB — VITAMIN B12: Vitamin B-12: 310

## 2016-11-22 LAB — VITAMIN D 25 HYDROXY (VIT D DEFICIENCY, FRACTURES): Vit D, 25-Hydroxy: 18.5

## 2016-12-01 ENCOUNTER — Telehealth: Payer: Self-pay

## 2016-12-01 NOTE — Telephone Encounter (Signed)
Pt called stating he would prefer to change his catheter company to [employer], who is a Pharmacist, community. Pt stated that he needs a script to take to them. Please advise.

## 2016-12-01 NOTE — Telephone Encounter (Signed)
Okay to print script.

## 2016-12-01 NOTE — Telephone Encounter (Signed)
Script was printed for pt.

## 2016-12-03 ENCOUNTER — Telehealth: Payer: Self-pay

## 2016-12-03 NOTE — Telephone Encounter (Signed)
Please advise, non urgent

## 2016-12-03 NOTE — Telephone Encounter (Signed)
He's over 2 years form treatment. Ok to remove.

## 2016-12-03 NOTE — Telephone Encounter (Signed)
Pt came in today and would like to know what are the plans for his port?  Will it be removed in the near future or does he need to have it flushed?  Please advise.

## 2016-12-03 NOTE — Telephone Encounter (Signed)
Faxed to Dr. Jamal Collin office to have removed

## 2016-12-07 ENCOUNTER — Ambulatory Visit (INDEPENDENT_AMBULATORY_CARE_PROVIDER_SITE_OTHER): Payer: PPO

## 2016-12-07 ENCOUNTER — Telehealth: Payer: Self-pay

## 2016-12-07 VITALS — BP 136/78 | HR 74 | Ht 73.0 in | Wt 254.6 lb

## 2016-12-07 DIAGNOSIS — R300 Dysuria: Secondary | ICD-10-CM | POA: Diagnosis not present

## 2016-12-07 LAB — URINALYSIS, COMPLETE
Bilirubin, UA: NEGATIVE
GLUCOSE, UA: NEGATIVE
Ketones, UA: NEGATIVE
NITRITE UA: POSITIVE — AB
PH UA: 6.5 (ref 5.0–7.5)
Specific Gravity, UA: 1.015 (ref 1.005–1.030)
UUROB: 0.2 mg/dL (ref 0.2–1.0)

## 2016-12-07 LAB — MICROSCOPIC EXAMINATION

## 2016-12-07 NOTE — Progress Notes (Signed)
Pt presented today with c/o urinary frequency and dysuria. A pt cath'd specimen was obtained for u/a and cx.  Blood pressure 136/78, pulse 74, height 6\' 1"  (1.854 m), weight 254 lb 9.6 oz (115.5 kg).

## 2016-12-07 NOTE — Telephone Encounter (Signed)
Pt called stating he was up q42min last night with pressure in the bladder. Pt stated that he was able to urinate on his own, which he has not done in a while, and was having severe burning. Pt stated that this morning when he performed CIC he did not get much out. Pt was added to nurse schedule for today.

## 2016-12-08 ENCOUNTER — Ambulatory Visit (INDEPENDENT_AMBULATORY_CARE_PROVIDER_SITE_OTHER): Payer: PPO | Admitting: Family Medicine

## 2016-12-08 ENCOUNTER — Ambulatory Visit (INDEPENDENT_AMBULATORY_CARE_PROVIDER_SITE_OTHER): Payer: PPO

## 2016-12-08 VITALS — BP 150/56 | HR 80 | Temp 99.7°F | Ht 72.0 in | Wt 253.0 lb

## 2016-12-08 DIAGNOSIS — R3911 Hesitancy of micturition: Secondary | ICD-10-CM

## 2016-12-08 DIAGNOSIS — Z0000 Encounter for general adult medical examination without abnormal findings: Secondary | ICD-10-CM | POA: Diagnosis not present

## 2016-12-08 DIAGNOSIS — R350 Frequency of micturition: Secondary | ICD-10-CM

## 2016-12-08 DIAGNOSIS — N400 Enlarged prostate without lower urinary tract symptoms: Secondary | ICD-10-CM

## 2016-12-08 DIAGNOSIS — I10 Essential (primary) hypertension: Secondary | ICD-10-CM

## 2016-12-08 DIAGNOSIS — I2510 Atherosclerotic heart disease of native coronary artery without angina pectoris: Secondary | ICD-10-CM

## 2016-12-08 DIAGNOSIS — E785 Hyperlipidemia, unspecified: Secondary | ICD-10-CM | POA: Diagnosis not present

## 2016-12-08 MED ORDER — CIPROFLOXACIN HCL 500 MG PO TABS: 500.0000 mg | ORAL_TABLET | Freq: Two times a day (BID) | ORAL | 0 refills | Status: DC

## 2016-12-08 NOTE — Patient Instructions (Addendum)
   I recommend that you take OTC probiotic capsule with each dose of ciprofloxacin   Call back for urology referral if urination is not much better in 2-3 weeks.

## 2016-12-08 NOTE — Progress Notes (Signed)
Subjective:   Bryan Nelson is a 83 y.o. male who presents for an Initial Medicare Annual Wellness Visit.  Review of Systems  N/A      Objective:    Today's Vitals   12/08/16 1423  BP: (!) 150/56  Pulse: 80  Temp: 99.7 F (37.6 C)  TempSrc: Oral  Weight: 253 lb (114.8 kg)  Height: 6' (1.829 m)  PainSc: 0-No pain   Body mass index is 34.31 kg/m.  Current Medications (verified) Outpatient Encounter Prescriptions as of 12/08/2016  Medication Sig  . aspirin 81 MG tablet Take 81 mg by mouth daily.  Marland Kitchen atorvastatin (LIPITOR) 10 MG tablet Take 1 tablet (10 mg total) by mouth daily.  . clopidogrel (PLAVIX) 75 MG tablet Take 1 tablet (75 mg total) by mouth daily.  . finasteride (PROSCAR) 5 MG tablet Take 1 tablet (5 mg total) by mouth daily.  . hydrochlorothiazide (HYDRODIURIL) 25 MG tablet Take 1 tablet (25 mg total) by mouth daily.  . isosorbide dinitrate (ISORDIL) 30 MG tablet Take 1 tablet (30 mg total) by mouth every morning.  . metoprolol (LOPRESSOR) 100 MG tablet Take 1 tablet (100 mg total) by mouth 2 (two) times daily.  . nitroGLYCERIN (NITROSTAT) 0.4 MG SL tablet Place 0.4 mg under the tongue every 5 (five) minutes as needed for chest pain. Reported on 03/10/2016  . omeprazole (PRILOSEC) 40 MG capsule Take 1 capsule (40 mg total) by mouth 2 (two) times daily.  . ramipril (ALTACE) 2.5 MG capsule Take 1 capsule (2.5 mg total) by mouth daily.  . ranitidine (ZANTAC) 150 MG tablet Take 1 tablet (150 mg total) by mouth 2 (two) times daily. Reported on 03/10/2016  . sucralfate (CARAFATE) 1 G tablet Take 1 g by mouth 2 (two) times daily.  . tamsulosin (FLOMAX) 0.4 MG CAPS capsule Take 1 capsule (0.4 mg total) by mouth daily.   Facility-Administered Encounter Medications as of 12/08/2016  Medication  . lidocaine (XYLOCAINE) 2 % jelly 1 application    Allergies (verified) Ciprofloxacin; Lisinopril; Penicillins; and Sulfa antibiotics   History: Past Medical History:  Diagnosis  Date  . Arteriosclerosis of coronary artery 08/26/2011   Overview:      a.  1999 PCI of the mid LAD with stent.      b.  2002 Cath [place]: EF 56%. RCA-distal 25/25%. Left main-50% ostial.  Left circumflex-25% OM2.  LAD-25% proximal.  75% mid.  25/25% distal. 75% D1.      c.  07/2011 PCI of RCA with DES.[place]   . Bleeding gastrointestinal 08/26/2011   Overview:      a.  Chronic abdominal pain, present improving.      b.  Duodenitis and gastritis by EGD in 2000.      c.  Pylori in 2000, treated.      d.  Gastroesophageal reflux disease.      e.  Diverticular disease.    . BP (high blood pressure) 08/26/2011  . Cancer ([place]) 2015   Lymphoma  . Heart disease   . Helicobacter pylori infection 06/18/2015   by EGD RX 08/18/1999   . History of adenomatous polyp of colon 06/18/2015   Past Surgical History:  Procedure Laterality Date  . ANGIOPLASTY    . CATARACT EXTRACTION    . GREEN LIGHT LASER TURP (TRANSURETHRAL RESECTION OF PROSTATE N/A 04/16/2015   Procedure: GREEN LIGHT LASER TURP (TRANSURETHRAL RESECTION OF PROSTATE WITH BLADDER BIOPSY;  Surgeon: Collier Flowers, MD;  Location: [facility] ORS;  Service:  Urology;  Laterality: N/A;  . heart stent placement     1998, 2000, 2012   Family History  Problem Relation Age of Onset  . Osteoporosis Mother   . Alzheimer's disease Father   . Kidney disease Neg Hx   . Prostate cancer Neg Hx   . Bladder Cancer Neg Hx   . Kidney cancer Neg Hx    Social History   Occupational History  . Not on file.   Social History Main Topics  . Smoking status: Former Smoker    Packs/day: 1.50    Years: 12.00    Types: Cigarettes    Quit date: 08/23/1981  . Smokeless tobacco: Never Used  . Alcohol use No  . Drug use: No  . Sexual activity: Not on file   Tobacco Counseling Counseling given: Not Answered   Activities of Daily Living No flowsheet data found.  Immunizations and Health Maintenance Immunization History  Administered Date(s) Administered  .  Influenza, High Dose Seasonal PF 06/18/2015, 06/01/2016  . Tdap 09/11/2016   Health Maintenance Due  Topic Date Due  . PNA vac Low Risk Adult (1 of 2 - PCV13) 11/19/1998    Patient Care Team: [name], MD as PCP - General (Family Medicine) Forest Gleason, MD (Unknown Physician Specialty) Christene Lye, MD (General Surgery) Collier Flowers, MD (Urology)  Indicate any recent Medical Services you may have received from other than [facility] providers in the past year (date may be approximate).    Assessment:   This is a routine wellness examination for Bryan Nelson.   Hearing/Vision screen No exam data present  Dietary issues and exercise activities discussed:    Goals    None     Depression Screen PHQ 2/9 Scores 08/25/2016 06/18/2015  PHQ - 2 Score 1 0    Fall Risk Fall Risk  08/25/2016 06/18/2015  Falls in the past year? No No    Cognitive Function:        Screening Tests Health Maintenance  Topic Date Due  . PNA vac Low Risk Adult (1 of 2 - PCV13) 11/19/1998  . INFLUENZA VACCINE  03/23/2017  . TETANUS/TDAP  09/11/2026        Plan:  I have personally reviewed and addressed the Medicare Annual Wellness questionnaire and have noted the following in the patient's chart:  A. Medical and social history B. Use of alcohol, tobacco or illicit drugs  C. Current medications and supplements D. Functional ability and status E.  Nutritional status F.  Physical activity G. Advance directives H. List of other physicians I.  Hospitalizations, surgeries, and ER visits in previous 12 months J.  [place] such as hearing and vision if needed, cognitive and depression L. Referrals and appointments - none  In addition, I have reviewed and discussed with patient certain preventive protocols, quality metrics, and best practice recommendations. A written personalized care plan for preventive services as well as general preventive health recommendations were provided  to patient.  See attached scanned questionnaire for additional information.   Signed,  Fabio Neighbors, LPN Nurse Health Advisor   MD Recommendations: None. Pt states he has had the pneumonia vaccines at [place]. Requested copy.  I have reviewed the health advisor's note, was available for consultation, and agree with documentation and plan  Lelon Huh, MD

## 2016-12-08 NOTE — Patient Instructions (Signed)
Mr. Bryan Nelson , Thank you for taking time to come for your Medicare Wellness Visit. I appreciate your ongoing commitment to your health goals. Please review the following plan we discussed and let me know if I can assist you in the future.   Screening recommendations/referrals: Colonoscopy: last done 10/25/07 Recommended yearly ophthalmology/optometry visit for glaucoma screening and checkup Recommended yearly dental visit for hygiene and checkup  Vaccinations: Influenza vaccine: done 06/01/16 Pneumococcal vaccine: Patient states he has had these at the [place]. Requested copy for records. Tdap vaccine: last done 09/11/16 Shingles vaccine:  Patient states he has ad this at the [place]. Requested copy for records.   Advanced directives: Completed  Conditions/risks identified: fall risk prevention  Next appointment: None  Preventive Care 65 Years and Older, Male Preventive care refers to lifestyle choices and visits with your health care provider that can promote health and wellness. What does preventive care include?  A yearly physical exam. This is also called an annual well check.  Dental exams once or twice a year.  Routine eye exams. Ask your health care provider how often you should have your eyes checked.  Personal lifestyle choices, including:  Daily care of your teeth and gums.  Regular physical activity.  Eating a healthy diet.  Avoiding tobacco and drug use.  Limiting alcohol use.  Practicing safe sex.  Taking low doses of aspirin every day.  Taking vitamin and mineral supplements as recommended by your health care provider. What happens during an annual well check? The services and screenings done by your health care provider during your annual well check will depend on your age, overall health, lifestyle risk factors, and family history of disease. Counseling  Your health care provider may ask you questions about your:  Alcohol use.  Tobacco use.  Drug  use.  Emotional well-being.  Home and relationship well-being.  Sexual activity.  Eating habits.  History of falls.  Memory and ability to understand (cognition).  Work and work Statistician. Screening  You may have the following tests or measurements:  Height, weight, and BMI.  Blood pressure.  Lipid and cholesterol levels. These may be checked every 5 years, or more frequently if you are over 82 years old.  Skin check.  Lung cancer screening. You may have this screening every year starting at age 52 if you have a 30-pack-year history of smoking and currently smoke or have quit within the past 15 years.  Fecal occult blood test (FOBT) of the stool. You may have this test every year starting at age 48.  Flexible sigmoidoscopy or colonoscopy. You may have a sigmoidoscopy every 5 years or a colonoscopy every 10 years starting at age 7.  Prostate cancer screening. Recommendations will vary depending on your family history and other risks.  Hepatitis C blood test.  Hepatitis B blood test.  Sexually transmitted disease (STD) testing.  Diabetes screening. This is done by checking your blood sugar (glucose) after you have not eaten for a while (fasting). You may have this done every 1-3 years.  Abdominal aortic aneurysm (AAA) screening. You may need this if you are a current or former smoker.  Osteoporosis. You may be screened starting at age 75 if you are at high risk. Talk with your health care provider about your test results, treatment options, and if necessary, the need for more tests. Vaccines  Your health care provider may recommend certain vaccines, such as:  Influenza vaccine. This is recommended every year.  Tetanus, diphtheria, and acellular  pertussis (Tdap, Td) vaccine. You may need a Td booster every 10 years.  Zoster vaccine. You may need this after age 60.  Pneumococcal 13-valent conjugate (PCV13) vaccine. One dose is recommended after age  58.  Pneumococcal polysaccharide (PPSV23) vaccine. One dose is recommended after age 57. Talk to your health care provider about which screenings and vaccines you need and how often you need them. This information is not intended to replace advice given to you by your health care provider. Make sure you discuss any questions you have with your health care provider. Document Released: 09/05/2015 Document Revised: 04/28/2016 Document Reviewed: 06/10/2015 Elsevier Interactive Patient Education  2017 [place] Prevention in the Home Falls can cause injuries. They can happen to people of all ages. There are many things you can do to make your home safe and to help prevent falls. What can I do on the outside of my home?  Regularly fix the edges of walkways and driveways and fix any cracks.  Remove anything that might make you trip as you walk through a door, such as a raised step or threshold.  Trim any bushes or trees on the path to your home.  Use bright outdoor lighting.  Clear any walking paths of anything that might make someone trip, such as rocks or tools.  Regularly check to see if handrails are loose or broken. Make sure that both sides of any steps have handrails.  Any raised decks and porches should have guardrails on the edges.  Have any leaves, snow, or ice cleared regularly.  Use sand or salt on walking paths during winter.  Clean up any spills in your garage right away. This includes oil or grease spills. What can I do in the bathroom?  Use night lights.  Install grab bars by the toilet and in the tub and shower. Do not use towel bars as grab bars.  Use non-skid mats or decals in the tub or shower.  If you need to sit down in the shower, use a plastic, non-slip stool.  Keep the floor dry. Clean up any water that spills on the floor as soon as it happens.  Remove soap buildup in the tub or shower regularly.  Attach bath mats securely with double-sided  non-slip rug tape.  Do not have throw rugs and other things on the floor that can make you trip. What can I do in the bedroom?  Use night lights.  Make sure that you have a light by your bed that is easy to reach.  Do not use any sheets or blankets that are too big for your bed. They should not hang down onto the floor.  Have a firm chair that has side arms. You can use this for support while you get dressed.  Do not have throw rugs and other things on the floor that can make you trip. What can I do in the kitchen?  Clean up any spills right away.  Avoid walking on wet floors.  Keep items that you use a lot in easy-to-reach places.  If you need to reach something above you, use a strong step stool that has a grab bar.  Keep electrical cords out of the way.  Do not use floor polish or wax that makes floors slippery. If you must use wax, use non-skid floor wax.  Do not have throw rugs and other things on the floor that can make you trip. What can I do with my stairs?  Do not leave any items on the stairs.  Make sure that there are handrails on both sides of the stairs and use them. Fix handrails that are broken or loose. Make sure that handrails are as long as the stairways.  Check any carpeting to make sure that it is firmly attached to the stairs. Fix any carpet that is loose or worn.  Avoid having throw rugs at the top or bottom of the stairs. If you do have throw rugs, attach them to the floor with carpet tape.  Make sure that you have a light switch at the top of the stairs and the bottom of the stairs. If you do not have them, ask someone to add them for you. What else can I do to help prevent falls?  Wear shoes that:  Do not have high heels.  Have rubber bottoms.  Are comfortable and fit you well.  Are closed at the toe. Do not wear sandals.  If you use a stepladder:  Make sure that it is fully opened. Do not climb a closed stepladder.  Make sure that both  sides of the stepladder are locked into place.  Ask someone to hold it for you, if possible.  Clearly mark and make sure that you can see:  Any grab bars or handrails.  First and last steps.  Where the edge of each step is.  Use tools that help you move around (mobility aids) if they are needed. These include:  Canes.  Walkers.  Scooters.  Crutches.  Turn on the lights when you go into a dark area. Replace any light bulbs as soon as they burn out.  Set up your furniture so you have a clear path. Avoid moving your furniture around.  If any of your floors are uneven, fix them.  If there are any pets around you, be aware of where they are.  Review your medicines with your doctor. Some medicines can make you feel dizzy. This can increase your chance of falling. Ask your doctor what other things that you can do to help prevent falls. This information is not intended to replace advice given to you by your health care provider. Make sure you discuss any questions you have with your health care provider. Document Released: 06/05/2009 Document Revised: 01/15/2016 Document Reviewed: 09/13/2014 Elsevier Interactive Patient Education  2017 [employer].

## 2016-12-08 NOTE — Progress Notes (Signed)
Patient: Bryan Nelson, Male    DOB: June 11, 1932, 83 y.o.   MRN: [id number] Visit Date: 12/08/2016  Today's Provider: Lelon Huh, MD   Chief Complaint  Patient presents with  . Medicare Wellness  . Hypertension    follow up  . Gastroesophageal Reflux    follow up  . Hyperlipidemia    follow up   Subjective:  Please see [facility] note for AWV  His primary concern today is difficulty voiding. He apparently had TURP by Dr. Elnoria Nelson in 2016, but over the last several months has had increasing problems with urinary frequency and hesitancy and having to cath himself frequently. He was reported to have 60+ gram prostate at his office visit with Bryan Norman, PA-C on 11/18/2016. He was restarted on tamsulosin and finasteride and reports no change in symptoms. Symptoms occur throughout day and has to get up several times a night to void.    -----------------------------------------------------------------  Hypertension, follow-up:  BP Readings from Last 3 Encounters:  12/08/16 (!) 150/56  12/07/16 136/78  11/18/16 (!) 141/68    He was last seen for hypertension 5 months ago.  Management since that visit includes none. He reports good compliance with treatment. He is not having side effects.  He is not exercising. He is adherent to low salt diet.   Outside blood pressures are normal per patient report. He is experiencing none.  Patient denies chest pain, chest pressure/discomfort, claudication, dyspnea, exertional chest pressure/discomfort, fatigue, irregular heart beat, lower extremity edema, near-syncope, orthopnea, palpitations, paroxysmal nocturnal dyspnea, syncope and tachypnea.   Cardiovascular risk factors include advanced age (older than 26 for men, 13 for women), dyslipidemia and hypertension.  Use of agents associated with hypertension: NSAIDS.     Weight trend: stable Wt Readings from Last 3 Encounters:  12/08/16 253 lb (114.8 kg)  12/07/16 254 lb 9.6 oz (115.5 kg)    11/18/16 252 lb 4.8 oz (114.4 kg)    Current diet: in general, a "healthy" diet    ------------------------------------------------------------------------ Follow up of Arteriosclerosis of Coronary Artery:  Patient was last seen for this problem 5 months ago and no changes were made.   Follow up of GERD:  Patient was last seen for this problem 5 months ago and no changes were made.   Follow up of Vitamin D Deficiency:  Patient was last seen for this problem 5 months ago and no changes were made.     Lipid/Cholesterol, Follow-up:   Last seen for this 5 months ago.  Management changes since that visit include no changes. . Last Lipid Panel: No results found for: CHOL, TRIG, HDL, CHOLHDL, VLDL, LDLCALC, LDLDIRECT  Risk factors for vascular disease include hypercholesterolemia and hypertension  He reports good compliance with treatment. He is not having side effects.  Current symptoms include none and have been stable. Weight trend: stable Prior visit with dietician: no Current diet: in general, a "healthy" diet   Current exercise: none  Wt Readings from Last 3 Encounters:  12/08/16 253 lb (114.8 kg)  12/07/16 254 lb 9.6 oz (115.5 kg)  11/18/16 252 lb 4.8 oz (114.4 kg)    -------------------------------------------------------------------   Review of Systems  Constitutional: Negative for appetite change, chills, fatigue and fever.  HENT: Negative for congestion, ear pain, hearing loss, nosebleeds and trouble swallowing.   Eyes: Negative for pain and visual disturbance.  Respiratory: Negative for cough, chest tightness and shortness of breath.   Cardiovascular: Negative for chest pain, palpitations and leg  swelling.  Gastrointestinal: Negative for abdominal pain, blood in stool, constipation, diarrhea, nausea and vomiting.  Endocrine: Negative for polydipsia, polyphagia and polyuria.  Genitourinary: Positive for difficulty urinating. Negative for dysuria and flank  pain.  Musculoskeletal: Negative for arthralgias, back pain, joint swelling, myalgias and neck stiffness.  Skin: Negative for color change, rash and wound.  Neurological: Negative for dizziness, tremors, seizures, speech difficulty, weakness, light-headedness and headaches.  Psychiatric/Behavioral: Negative for behavioral problems, confusion, decreased concentration, dysphoric mood and sleep disturbance. The patient is not nervous/anxious.   All other systems reviewed and are negative.   Social History      He  reports that he quit smoking about 45 years ago. His smoking use included Cigarettes. He has a 18.00 pack-year smoking history. He has never used smokeless tobacco. He reports that he drinks alcohol. He reports that he does not use drugs.       Social History   Social History  . Marital status: Widowed    Spouse name: N/A  . Number of children: N/A  . Years of education: N/A   Social History Main Topics  . Smoking status: Former Smoker    Packs/day: 1.50    Years: 12.00    Types: Cigarettes    Quit date: 08/24/1971  . Smokeless tobacco: Never Used  . Alcohol use 0.0 oz/week     Comment: occasional  . Drug use: No  . Sexual activity: Not on file   Other Topics Concern  . Not on file   Social History Narrative  . No narrative on file    Past Medical History:  Diagnosis Date  . Arteriosclerosis of coronary artery 08/26/2011   Overview:      a.  1999 PCI of the mid LAD with stent.      b.  2002 Cath [facility]: EF 56%. RCA-distal 25/25%. Left main-50% ostial.  Left circumflex-25% OM2.  LAD-25% proximal.  75% mid.  25/25% distal. 75% D1.      c.  07/2011 PCI of RCA with DES.[facility]   . Bleeding gastrointestinal 08/26/2011   Overview:      a.  Chronic abdominal pain, present improving.      b.  Duodenitis and gastritis by EGD in 2000.      c.  Pylori in 2000, treated.      d.  Gastroesophageal reflux disease.      e.  Diverticular disease.    . BP (high blood pressure) 08/26/2011    . Cancer ([place]) 2015   Lymphoma  . Heart disease   . Helicobacter pylori infection 06/18/2015   by EGD RX 08/18/1999   . History of adenomatous polyp of colon 06/18/2015     Patient Active Problem List   Diagnosis Date Noted  . Malleolar fracture (Right) 09/10/2016  . Closed dislocation of right ankle 09/10/2016  . Recurrent UTI 12/21/2015  . Urinary retention 09/24/2015  . Narrowing of intervertebral disc space 06/18/2015  . Diverticulitis 06/18/2015  . Esophageal reflux 06/18/2015  . Familial multiple lipoprotein-type hyperlipidemia 06/18/2015  . Insomnia 06/18/2015  . Vitamin D deficiency 06/18/2015  . DLBCL (diffuse large B cell lymphoma) ([place]) 04/10/2014  . Benign prostatic hyperplasia with urinary obstruction 08/03/2012  . ED (erectile dysfunction) of organic origin 08/03/2012  . Incomplete bladder emptying 08/03/2012  . Breath shortness 12/09/2011  . Benign prostatic hypertrophy without urinary obstruction 08/26/2011  . Arteriosclerosis of coronary artery 08/26/2011  . Bleeding gastrointestinal 08/26/2011  . HLD (hyperlipidemia) 08/26/2011  . BP (high  blood pressure) 08/26/2011  . Disorder of peripheral nervous system 08/26/2011  . Enlarged prostate 08/26/2011  . Peripheral nerve disease 08/26/2011  . Current tobacco use 08/26/2011    Past Surgical History:  Procedure Laterality Date  . ANGIOPLASTY    . CATARACT EXTRACTION    . GREEN LIGHT LASER TURP (TRANSURETHRAL RESECTION OF PROSTATE N/A 04/16/2015   Procedure: GREEN LIGHT LASER TURP (TRANSURETHRAL RESECTION OF PROSTATE WITH BLADDER BIOPSY;  Surgeon: Collier Flowers, MD;  Location: [facility] ORS;  Service: Urology;  Laterality: N/A;  . heart stent placement     1998, 2000, 2012    Family History        Family Status  Relation Status  . Mother Deceased  . Father Deceased  . Neg Hx         His family history includes Alzheimer's disease in his father; Osteoporosis in his mother.     Allergies  Allergen  Reactions  . Ciprofloxacin Diarrhea  . Lisinopril Other (See Comments)  . Penicillins     unknwon reaction.  tolerates amoxicillin  . Sulfa Antibiotics Itching and Rash    Rash     Current Outpatient Prescriptions:  .  aspirin 81 MG tablet, Take 81 mg by mouth daily., Disp: , Rfl:  .  atorvastatin (LIPITOR) 10 MG tablet, Take 1 tablet (10 mg total) by mouth daily., Disp: 90 tablet, Rfl: 4 .  Cholecalciferol (VITAMIN D3) 10000 units TABS, Take 1 capsule by mouth daily., Disp: , Rfl:  .  clopidogrel (PLAVIX) 75 MG tablet, Take 1 tablet (75 mg total) by mouth daily., Disp: 90 tablet, Rfl: 4 .  finasteride (PROSCAR) 5 MG tablet, Take 1 tablet (5 mg total) by mouth daily., Disp: 90 tablet, Rfl: 3 .  hydrochlorothiazide (HYDRODIURIL) 25 MG tablet, Take 1 tablet (25 mg total) by mouth daily., Disp: 90 tablet, Rfl: 4 .  isosorbide dinitrate (ISORDIL) 30 MG tablet, Take 1 tablet (30 mg total) by mouth every morning., Disp: 90 tablet, Rfl: 4 .  metoprolol (LOPRESSOR) 100 MG tablet, Take 1 tablet (100 mg total) by mouth 2 (two) times daily., Disp: 180 tablet, Rfl: 4 .  nitroGLYCERIN (NITROSTAT) 0.4 MG SL tablet, Place 0.4 mg under the tongue every 5 (five) minutes as needed for chest pain. Reported on 03/10/2016, Disp: , Rfl:  .  omeprazole (PRILOSEC) 40 MG capsule, Take 1 capsule (40 mg total) by mouth 2 (two) times daily. (Patient taking differently: Take 40 mg by mouth daily. ), Disp: 180 capsule, Rfl: 4 .  ramipril (ALTACE) 2.5 MG capsule, Take 1 capsule (2.5 mg total) by mouth daily., Disp: 90 capsule, Rfl: 4 .  ranitidine (ZANTAC) 150 MG tablet, Take 1 tablet (150 mg total) by mouth 2 (two) times daily. Reported on 03/10/2016 (Patient taking differently: Take 150 mg by mouth at bedtime. Reported on 03/10/2016), Disp: 180 tablet, Rfl: 4 .  sucralfate (CARAFATE) 1 G tablet, Take 1 g by mouth 2 (two) times daily., Disp: , Rfl:  .  tamsulosin (FLOMAX) 0.4 MG CAPS capsule, Take 1 capsule (0.4 mg total) by  mouth daily., Disp: 90 capsule, Rfl: 3  Current Facility-Administered Medications:  .  lidocaine (XYLOCAINE) 2 % jelly 1 application, 1 application, Urethral, Once, Collier Flowers, MD   Patient Care Team: [name], MD as PCP - General (Family Medicine) Forest Gleason, MD (Unknown Physician Specialty) Christene Lye, MD (General Surgery) Collier Flowers, MD (Urology)      Objective:   Vitals: Vital  Signs - Last Recorded  Most recent update: 12/08/2016 2:33 PM by Fabio Neighbors, LPN  BP    213/08 (BP Location: Right Arm)     Pulse  80     Temp  99.7 F (37.6 C) (Oral)     Ht  6' (1.829 m)     Wt  253 lb (114.8 kg)      BMI  34.31 kg/m          Physical Exam  General appearance: alert, well developed, well nourished, cooperative and in no distress Head: Normocephalic, without obvious abnormality, atraumatic Respiratory: Respirations even and unlabored, normal respiratory rate Extremities: No gross deformities Skin: Skin color, texture, turgor normal. No rashes seen  GU: 3+ slightly boggy prostate.   Depression Screen PHQ 2/9 Scores 08/25/2016 06/18/2015  PHQ - 2 Score 1 0      Assessment & Plan:    1. Enlarged prostate   2. Urinary frequency   3. Urinary hesitancy  He is very frustrated with worsening symptoms, particularly considering he is only 18 months s/p TURP. His prostate is considerably larger that I would expect after TURP. He would like to see another urologist to consider robotic assisted prostatectomy. Will first try antibiotic to cover for prostatitis and continue on tamsulosin and finasteride. If not rapidly improving will consider second opinion with another urologist.    4. Essential hypertension Well controlled.  Continue current medications.    5. Arteriosclerosis of coronary artery Asymptomatic. Compliant with medication.  Continue aggressive risk factor modification.  Continues regular follow up at [place].   6.  Hyperlipidemia, unspecified hyperlipidemia type Reviewed and abstracted recent labs from [place].   Lelon Huh, MD  [facility]

## 2016-12-09 DIAGNOSIS — S93431D Sprain of tibiofibular ligament of right ankle, subsequent encounter: Secondary | ICD-10-CM | POA: Diagnosis not present

## 2016-12-09 DIAGNOSIS — S8254XD Nondisplaced fracture of medial malleolus of right tibia, subsequent encounter for closed fracture with routine healing: Secondary | ICD-10-CM | POA: Diagnosis not present

## 2016-12-09 DIAGNOSIS — M25571 Pain in right ankle and joints of right foot: Secondary | ICD-10-CM | POA: Diagnosis not present

## 2016-12-11 ENCOUNTER — Other Ambulatory Visit: Payer: Self-pay | Admitting: Urology

## 2016-12-11 LAB — CULTURE, URINE COMPREHENSIVE

## 2016-12-11 MED ORDER — AMOXICILLIN-POT CLAVULANATE 875-125 MG PO TABS: 1.0000 | ORAL_TABLET | Freq: Two times a day (BID) | ORAL | 0 refills | Status: DC

## 2016-12-13 ENCOUNTER — Other Ambulatory Visit: Payer: Self-pay

## 2016-12-13 ENCOUNTER — Telehealth: Payer: Self-pay

## 2016-12-13 DIAGNOSIS — N390 Urinary tract infection, site not specified: Secondary | ICD-10-CM

## 2016-12-13 MED ORDER — AMOXICILLIN-POT CLAVULANATE 875-125 MG PO TABS: 1.0000 | ORAL_TABLET | Freq: Two times a day (BID) | ORAL | 0 refills | Status: DC

## 2016-12-13 NOTE — Telephone Encounter (Signed)
Spoke with pt in reference to abx. Pt stated he would go pick up medication now.

## 2016-12-13 NOTE — Telephone Encounter (Signed)
No answer. No vm setup.  

## 2016-12-13 NOTE — Telephone Encounter (Signed)
-----   Message from Nori Riis, PA-C sent at 12/11/2016  9:36 PM EDT ----- Please notify Mr. Fleming that I have sent a prescription for Augmentin 875/125, one tablet twice daily for 7 days to [facility].

## 2016-12-16 ENCOUNTER — Encounter: Payer: Self-pay | Admitting: *Deleted

## 2016-12-17 ENCOUNTER — Encounter: Payer: Self-pay | Admitting: Family Medicine

## 2016-12-27 ENCOUNTER — Encounter: Payer: Self-pay | Admitting: Urology

## 2016-12-27 ENCOUNTER — Telehealth: Payer: Self-pay

## 2016-12-27 ENCOUNTER — Ambulatory Visit (INDEPENDENT_AMBULATORY_CARE_PROVIDER_SITE_OTHER): Payer: PPO | Admitting: Urology

## 2016-12-27 VITALS — BP 123/65 | HR 68 | Ht 72.0 in | Wt 253.0 lb

## 2016-12-27 DIAGNOSIS — R339 Retention of urine, unspecified: Secondary | ICD-10-CM | POA: Diagnosis not present

## 2016-12-27 NOTE — Telephone Encounter (Signed)
test

## 2016-12-27 NOTE — Progress Notes (Signed)
12/27/2016 1:07 PM   Bryan Nelson 07/26/1932 [id number]  Referring provider: [name], MD [address] [place], [place] [address]  Chief Complaint  Patient presents with  . Urinary Retention    HPI: Bryan Nelson:  Patient is an 83 year old Caucasian male who presents today for a scheduled 6 month follow-up who has a history of recurrent UTI's and is currently cathetering himself 2-3 times daily in an effort to keep his postvoid residuals below 200 cc.  Over the last 6 months, the patient has had 7 documented UTI's.  They have been positive for pseudomonas and enterococcus.  He has been tried with several different types of self catheter.  He is currently using a new product from [facility], [place] [facility], and so far has had success with this catheter. His last urine culture was negative. His current UA is unremarkable.  He is not experiencing symptoms of urinary tract infection at this time.  He had a greenlight laser by Dr. Elnoria Howard in 2016'  recent CT scan of the upper tracts was normal for stones and hydronephrosis  Today Patient was placed on tamsulosin and finasteride and was asked to return to clinic in 1 year; he continues to catheterize 6 times per day and stays dry. He finds it in nuisance and at times uncomfortable. He describes when he had a lymphoma in the past that he had a suprapubic tube for about 18 months. He thinks he might rather have a suprapubic tube then perform clean intermittent catheterization.   PMH: Past Medical History:  Diagnosis Date  . Arteriosclerosis of coronary artery 08/26/2011   Overview:      a.  1999 PCI of the mid LAD with stent.      b.  2002 Cath [place]: EF 56%. RCA-distal 25/25%. Left main-50% ostial.  Left circumflex-25% OM2.  LAD-25% proximal.  75% mid.  25/25% distal. 75% D1.      c.  07/2011 PCI of RCA with DES.[place]   . Bleeding gastrointestinal 08/26/2011   Overview:      a.  Chronic abdominal pain, present improving.       b.  Duodenitis and gastritis by EGD in 2000.      c.  Pylori in 2000, treated.      d.  Gastroesophageal reflux disease.      e.  Diverticular disease.    . BP (high blood pressure) 08/26/2011  . Cancer ([place]) 2015   Lymphoma  . Heart disease   . Helicobacter pylori infection 06/18/2015   by EGD RX 08/18/1999   . History of adenomatous polyp of colon 06/18/2015    Surgical History: Past Surgical History:  Procedure Laterality Date  . ANGIOPLASTY    . CATARACT EXTRACTION    . GREEN LIGHT LASER TURP (TRANSURETHRAL RESECTION OF PROSTATE N/A 04/16/2015   Procedure: GREEN LIGHT LASER TURP (TRANSURETHRAL RESECTION OF PROSTATE WITH BLADDER BIOPSY;  Surgeon: Collier Flowers, MD;  Location: [facility] ORS;  Service: Urology;  Laterality: N/A;  . heart stent placement     1998, 2000, 2012    Home Medications:  Allergies as of 12/27/2016      Reactions   Ciprofloxacin Diarrhea   Lisinopril Other (See Comments)   Penicillins    unknwon reaction.  tolerates amoxicillin   Sulfa Antibiotics Itching, Rash   Rash      Medication List       Accurate as of 12/27/16  1:07 PM. Always use your most recent med list.  amoxicillin-clavulanate 875-125 MG tablet Commonly known as:  AUGMENTIN Take 1 tablet by mouth every 12 (twelve) hours.   aspirin 81 MG tablet Take 81 mg by mouth daily.   atorvastatin 10 MG tablet Commonly known as:  LIPITOR Take 1 tablet (10 mg total) by mouth daily.   ciprofloxacin 500 MG tablet Commonly known as:  CIPRO Take 1 tablet (500 mg total) by mouth 2 (two) times daily.   clopidogrel 75 MG tablet Commonly known as:  PLAVIX Take 1 tablet (75 mg total) by mouth daily.   finasteride 5 MG tablet Commonly known as:  PROSCAR Take 1 tablet (5 mg total) by mouth daily.   hydrochlorothiazide 25 MG tablet Commonly known as:  HYDRODIURIL Take 1 tablet (25 mg total) by mouth daily.   isosorbide dinitrate 30 MG tablet Commonly known as:  ISORDIL Take 1 tablet (30 mg  total) by mouth every morning.   metoprolol 100 MG tablet Commonly known as:  LOPRESSOR Take 1 tablet (100 mg total) by mouth 2 (two) times daily.   nitroGLYCERIN 0.4 MG SL tablet Commonly known as:  NITROSTAT Place 0.4 mg under the tongue every 5 (five) minutes as needed for chest pain. Reported on 03/10/2016   omeprazole 40 MG capsule Commonly known as:  PRILOSEC Take 1 capsule (40 mg total) by mouth 2 (two) times daily.   ramipril 2.5 MG capsule Commonly known as:  ALTACE Take 1 capsule (2.5 mg total) by mouth daily.   ranitidine 150 MG tablet Commonly known as:  ZANTAC Take 1 tablet (150 mg total) by mouth 2 (two) times daily. Reported on 03/10/2016   sucralfate 1 g tablet Commonly known as:  CARAFATE Take 1 g by mouth 2 (two) times daily.   tamsulosin 0.4 MG Caps capsule Commonly known as:  FLOMAX Take 1 capsule (0.4 mg total) by mouth daily.   Vitamin D3 10000 units Tabs Take 1 capsule by mouth daily.       Allergies:  Allergies  Allergen Reactions  . Ciprofloxacin Diarrhea  . Lisinopril Other (See Comments)  . Penicillins     unknwon reaction.  tolerates amoxicillin  . Sulfa Antibiotics Itching and Rash    Rash    Family History: Family History  Problem Relation Age of Onset  . Osteoporosis Mother   . Alzheimer's disease Father   . Kidney disease Neg Hx   . Prostate cancer Neg Hx   . Bladder Cancer Neg Hx   . Kidney cancer Neg Hx     Social History:  reports that he quit smoking about 45 years ago. His smoking use included Cigarettes. He has a 18.00 pack-year smoking history. He has never used smokeless tobacco. He reports that he drinks alcohol. He reports that he does not use drugs.  ROS: UROLOGY Frequent Urination?: No Hard to postpone urination?: No Burning/pain with urination?: Yes Get up at night to urinate?: Yes Leakage of urine?: No Urine stream starts and stops?: No Trouble starting stream?: No Do you have to strain to urinate?:  No Blood in urine?: No Urinary tract infection?: No Sexually transmitted disease?: No Injury to kidneys or bladder?: No Painful intercourse?: No Weak stream?: No Erection problems?: No Penile pain?: No  Gastrointestinal Nausea?: No Vomiting?: No Indigestion/heartburn?: No Diarrhea?: No Constipation?: No  Constitutional Fever: No Night sweats?: No Weight loss?: No Fatigue?: No  Skin Skin rash/lesions?: No Itching?: No  Eyes Blurred vision?: No Double vision?: No  Ears/Nose/Throat Sore throat?: No Sinus problems?: No  Hematologic/Lymphatic  Swollen glands?: No Easy bruising?: No  Cardiovascular Leg swelling?: No Chest pain?: No  Respiratory Cough?: No Shortness of breath?: No  Endocrine Excessive thirst?: No  Musculoskeletal Back pain?: No Joint pain?: No  Neurological Headaches?: No Dizziness?: No  Psychologic Depression?: No Anxiety?: No  Physical Exam: BP 123/65 (BP Location: Left Arm, Patient Position: Sitting, Cuff Size: Normal)   Pulse 68   Ht 6' (1.829 m)   Wt 253 lb (114.8 kg)   BMI 34.31 kg/m     Laboratory Data: Lab Results  Component Value Date   WBC 5.1 11/08/2016   HGB 13.0 11/08/2016   HCT 37.7 (L) 11/08/2016   MCV 86.4 11/08/2016   PLT 212 11/08/2016    Lab Results  Component Value Date   CREATININE 1.2 11/22/2016    Lab Results  Component Value Date   PSA 0.81 11/22/2016   PSA 0.9 02/27/2014    No results found for: TESTOSTERONE  Lab Results  Component Value Date   HGBA1C 5.9 11/22/2016    Urinalysis    Component Value Date/Time   COLORURINE YELLOW (A) 09/14/2016 1805   APPEARANCEUR Cloudy (A) 12/07/2016 1110   LABSPEC 1.018 09/14/2016 1805   LABSPEC 1.022 08/22/2014 2005   PHURINE 6.0 09/14/2016 1805   GLUCOSEU Negative 12/07/2016 1110   GLUCOSEU Negative 08/22/2014 2005   HGBUR NEGATIVE 09/14/2016 1805   BILIRUBINUR Negative 12/07/2016 1110   BILIRUBINUR Negative 08/22/2014 2005   KETONESUR  NEGATIVE 09/14/2016 1805   PROTEINUR 1+ (A) 12/07/2016 1110   PROTEINUR 30 (A) 09/14/2016 1805   NITRITE Positive (A) 12/07/2016 1110   NITRITE NEGATIVE 09/14/2016 1805   LEUKOCYTESUR 2+ (A) 12/07/2016 1110   LEUKOCYTESUR 3+ 08/22/2014 2005    Pertinent Imaging: See above  Assessment & Plan:  I tried to encourage the patient to undergo urodynamics to see if a secondary procedure could help him first having a poorly contractile bladder. He tends to have his own thoughts about his problem and he may be correct that he has a combination of a large prostate and a poorly contractile bladder. I also try to encourage clean intermittent catheterization versus a long-term suprapubic tube. He was still like to consider 1. I will have him come back to see Dr. Erlene Quan in the future for final consideration. I can empathize at either system is not ideal  There are no diagnoses linked to this encounter.  No Follow-up on file.  Reece Packer, MD  [facility] [address], [place], [place] [address] [phone]

## 2016-12-31 ENCOUNTER — Other Ambulatory Visit: Payer: Self-pay | Admitting: *Deleted

## 2016-12-31 MED ORDER — SUCRALFATE 1 G PO TABS: 1.0000 g | ORAL_TABLET | Freq: Two times a day (BID) | ORAL | 4 refills | Status: DC

## 2017-01-10 DIAGNOSIS — R339 Retention of urine, unspecified: Secondary | ICD-10-CM | POA: Diagnosis not present

## 2017-01-12 ENCOUNTER — Encounter: Payer: Self-pay | Admitting: Urology

## 2017-01-12 ENCOUNTER — Ambulatory Visit: Payer: PPO | Admitting: Urology

## 2017-01-12 VITALS — BP 138/72 | HR 80 | Ht 73.0 in | Wt 250.0 lb

## 2017-01-12 DIAGNOSIS — N401 Enlarged prostate with lower urinary tract symptoms: Secondary | ICD-10-CM

## 2017-01-12 DIAGNOSIS — N323 Diverticulum of bladder: Secondary | ICD-10-CM

## 2017-01-12 DIAGNOSIS — N138 Other obstructive and reflux uropathy: Secondary | ICD-10-CM

## 2017-01-12 DIAGNOSIS — R339 Retention of urine, unspecified: Secondary | ICD-10-CM

## 2017-01-12 DIAGNOSIS — N390 Urinary tract infection, site not specified: Secondary | ICD-10-CM

## 2017-01-12 NOTE — Progress Notes (Signed)
01/12/2017 2:40 PM   Bryan Nelson [birth year] [id number]  Referring provider: [name], MD [address] [place], [place] [address]   HPI: 83 year old male with history of BPH, chronic urinary retention currently managed with intermittent catheterization who presents today for further discussion of his urinary symptoms.  Patient underwent laser ablation of his prostate, green light by Dr. Elnoria Howard in 2016. Since then, he has had significant issues including chronic urinary retention managed by intermittent catheterization. His also had recurrent urinary tract infections presumably related to self catheter and self cathetering technique.  He is currently on maximal medical therapy including Flomax and finasteride. Despite this, he is not voiding.  The patient reports that even before prostate surgery, he is having difficulty emptying his bladder. He was previously managed by Dr. Bernardo Heater at [facility]. He's also had urodynamics in the past but unable to generate contraction therefore no pressure flow study was performed.  Most recent CT scan from 11/03/2016 personally reviewed today. This shows persistent prostamegaly without significant TURP defect. He also has a thickened bladder wall and a large bladder diverticulum on the left. Calculated prostate volume 69 cc.  PSA  0.81 11/22/16.  PMH: Past Medical History:  Diagnosis Date  . Arteriosclerosis of coronary artery 08/26/2011   Overview:      a.  1999 PCI of the mid LAD with stent.      b.  2002 Cath [place]: EF 56%. RCA-distal 25/25%. Left main-50% ostial.  Left circumflex-25% OM2.  LAD-25% proximal.  75% mid.  25/25% distal. 75% D1.      c.  07/2011 PCI of RCA with DES.[place]   . Bleeding gastrointestinal 08/26/2011   Overview:      a.  Chronic abdominal pain, present improving.      b.  Duodenitis and gastritis by EGD in 2000.      c.  Pylori in 2000, treated.      d.  Gastroesophageal reflux disease.      e.   Diverticular disease.    . BP (high blood pressure) 08/26/2011  . Cancer ([place]) 2015   Lymphoma  . Heart disease   . Helicobacter pylori infection 06/18/2015   by EGD RX 08/18/1999   . History of adenomatous polyp of colon 06/18/2015    Surgical History: Past Surgical History:  Procedure Laterality Date  . ANGIOPLASTY    . CATARACT EXTRACTION    . COLONOSCOPY  2016  . GREEN LIGHT LASER TURP (TRANSURETHRAL RESECTION OF PROSTATE N/A 04/16/2015   Procedure: GREEN LIGHT LASER TURP (TRANSURETHRAL RESECTION OF PROSTATE WITH BLADDER BIOPSY;  Surgeon: Collier Flowers, MD;  Location: [facility] ORS;  Service: Urology;  Laterality: N/A;  . heart stent placement     1998, 2000, 2012    Home Medications:  Allergies as of 01/12/2017      Reactions   Ciprofloxacin Diarrhea   Lisinopril Other (See Comments)   Penicillins    unknwon reaction.  tolerates amoxicillin   Sulfa Antibiotics Itching, Rash   Rash      Medication List       Accurate as of 01/12/17 11:59 PM. Always use your most recent med list.          aspirin 81 MG tablet Take 81 mg by mouth daily.   atorvastatin 10 MG tablet Commonly known as:  LIPITOR Take 1 tablet (10 mg total) by mouth daily.   clopidogrel 75 MG tablet Commonly known as:  PLAVIX Take 1 tablet (  75 mg total) by mouth daily.   finasteride 5 MG tablet Commonly known as:  PROSCAR Take 1 tablet (5 mg total) by mouth daily.   hydrochlorothiazide 25 MG tablet Commonly known as:  HYDRODIURIL Take 1 tablet (25 mg total) by mouth daily.   isosorbide dinitrate 30 MG tablet Commonly known as:  ISORDIL Take 1 tablet (30 mg total) by mouth every morning.   metoprolol tartrate 100 MG tablet Commonly known as:  LOPRESSOR Take 1 tablet (100 mg total) by mouth 2 (two) times daily.   nitroGLYCERIN 0.4 MG SL tablet Commonly known as:  NITROSTAT Place 0.4 mg under the tongue every 5 (five) minutes as needed for chest pain. Reported on 03/10/2016   omeprazole 40 MG  capsule Commonly known as:  PRILOSEC Take 1 capsule (40 mg total) by mouth 2 (two) times daily.   ramipril 2.5 MG capsule Commonly known as:  ALTACE Take 1 capsule (2.5 mg total) by mouth daily.   ranitidine 150 MG tablet Commonly known as:  ZANTAC Take 1 tablet (150 mg total) by mouth 2 (two) times daily. Reported on 03/10/2016   sucralfate 1 g tablet Commonly known as:  CARAFATE Take 1 tablet (1 g total) by mouth 2 (two) times daily.   tamsulosin 0.4 MG Caps capsule Commonly known as:  FLOMAX Take 1 capsule (0.4 mg total) by mouth daily.   Vitamin D3 10000 units Tabs Take 1 capsule by mouth daily.       Allergies:  Allergies  Allergen Reactions  . Ciprofloxacin Diarrhea  . Lisinopril Other (See Comments)  . Penicillins     unknwon reaction.  tolerates amoxicillin  . Sulfa Antibiotics Itching and Rash    Rash    Family History: Family History  Problem Relation Age of Onset  . Osteoporosis Mother   . Alzheimer's disease Father   . Kidney disease Neg Hx   . Prostate cancer Neg Hx   . Bladder Cancer Neg Hx   . Kidney cancer Neg Hx     Social History:  reports that he quit smoking about 45 years ago. His smoking use included Cigarettes. He has a 18.00 pack-year smoking history. He has never used smokeless tobacco. He reports that he does not drink alcohol or use drugs.  ROS: UROLOGY Frequent Urination?: No Hard to postpone urination?: No Burning/pain with urination?: Yes Get up at night to urinate?: No Leakage of urine?: No Urine stream starts and stops?: No Trouble starting stream?: No Do you have to strain to urinate?: No Blood in urine?: No Urinary tract infection?: Yes Sexually transmitted disease?: No Injury to kidneys or bladder?: No Painful intercourse?: No Weak stream?: No Erection problems?: No Penile pain?: No  Gastrointestinal Nausea?: No Vomiting?: No Indigestion/heartburn?: No Diarrhea?: No Constipation?: No  Constitutional Fever:  No Night sweats?: No Weight loss?: No Fatigue?: No  Skin Skin rash/lesions?: No Itching?: No  Eyes Blurred vision?: No Double vision?: No  Ears/Nose/Throat Sore throat?: No Sinus problems?: No  Hematologic/Lymphatic Swollen glands?: No Easy bruising?: No  Cardiovascular Leg swelling?: No Chest pain?: No  Respiratory Cough?: No Shortness of breath?: No  Endocrine Excessive thirst?: No  Musculoskeletal Back pain?: No Joint pain?: No  Neurological Headaches?: No Dizziness?: No  Psychologic Depression?: No Anxiety?: No  Physical Exam: BP 138/72   Pulse 80   Ht 6\' 1"  (1.854 m)   Wt 250 lb (113.4 kg)   BMI 32.98 kg/m   Constitutional:  Alert and oriented, No acute distress.  Accompanied by friend/ family member today.    HEENT: [place] AT, moist mucus membranes.  Trachea midline, no masses. Cardiovascular: No clubbing, cyanosis, or edema. Respiratory: Normal respiratory effort, no increased work of breathing. GI: Abdomen is soft, nontender, nondistended, no abdominal masses GU: No CVA tenderness.  Skin: No rashes, bruises or suspicious lesions. Neurologic: Grossly intact, no focal deficits, moving all 4 extremities. Psychiatric: Normal mood and affect.  Laboratory Data: Lab Results  Component Value Date   WBC 6.1 01/31/2017   HGB 12.4 (L) 01/31/2017   HCT 37.5 01/31/2017   MCV 87 01/31/2017   PLT 196 01/31/2017    Lab Results  Component Value Date   CREATININE 1.17 01/31/2017    Lab Results  Component Value Date   PSA 0.81 11/22/2016   PSA 0.9 02/27/2014    Lab Results  Component Value Date   HGBA1C 5.9 11/22/2016    Urinalysis N/a  Pertinent Imaging: CT abdomen pelvis from 11/03/2016 reviewed personally today. This reveals an enlarged prostate without significant TURP defect, prostate volume calculated personally as above. Additionally, he does appear to have a large bladder diverticulum to the left of his bladder.  Assessment & Plan:    83 year old male with chronic urinary retention managed with clean intermittent catheterization who presents today to discuss his options. He is frustrated with his overall situation and is having increasing difficulty catheterizing. Review of his chart today was extensive as well as review of imaging. He comes in today essentially for a second opinion.  In general, feel that his voiding dysfunction is multifactorial. He does indeed have an enlarged objective appearing prostate but also has sequela of bladder dysfunction including a large bladder diverticulum which is likely contributing to his retention/inability to empty bladder.  1. Urine retention Chronic, question if related to continued ongoing bladder outlet obstruction versus hypocontractile bladder or possible combination of the 2 urodynamics of limited benefit due to inability to generate a contraction, unable to interpret pressure flow study  2. Recurrent UTI Bacterial colonization related to clean intermittent catheterization Large bladder diverticula which may not be emptying completely with catheterization also possible contribute in factor We'll continue to treat symptomatically as needed  3. Benign prostatic hyperplasia with urinary obstruction Continue Flomax and finasteride Recommend further evaluation of prostatic fossa and bladder with cystoscopy for surgical planning purposes as well as evaluation of self catheter issues  4. Bladder diverticulum Appreciated on most recent CT scan, likely related to chronic outlet obstruction   Return for cysto tomorrow.  Hollice Espy, MD  [facility] [address], [place] [address] [phone]  I spent 30 min with this patient of which greater than 50% was spent in counseling and coordination of care with the patient.

## 2017-01-13 ENCOUNTER — Encounter: Payer: Self-pay | Admitting: Urology

## 2017-01-13 ENCOUNTER — Ambulatory Visit: Payer: PPO | Admitting: Urology

## 2017-01-13 VITALS — BP 132/80 | HR 62 | Ht 73.0 in | Wt 250.0 lb

## 2017-01-13 DIAGNOSIS — N401 Enlarged prostate with lower urinary tract symptoms: Secondary | ICD-10-CM

## 2017-01-13 DIAGNOSIS — N323 Diverticulum of bladder: Secondary | ICD-10-CM

## 2017-01-13 DIAGNOSIS — N138 Other obstructive and reflux uropathy: Secondary | ICD-10-CM

## 2017-01-13 LAB — URINALYSIS, COMPLETE
Bilirubin, UA: NEGATIVE
Glucose, UA: NEGATIVE
KETONES UA: NEGATIVE
Nitrite, UA: POSITIVE — AB
Specific Gravity, UA: 1.025 (ref 1.005–1.030)
UUROB: 0.2 mg/dL (ref 0.2–1.0)
pH, UA: 5.5 (ref 5.0–7.5)

## 2017-01-13 LAB — MICROSCOPIC EXAMINATION
RBC, UA: NONE SEEN /hpf (ref 0–?)
WBC, UA: >30 /hpf — ABNORMAL HIGH (ref 0–?)

## 2017-01-13 MED ORDER — CIPROFLOXACIN HCL 500 MG PO TABS
500.0000 mg | ORAL_TABLET | Freq: Once | ORAL | Status: AC
  Administered 2017-01-13: 500 mg via ORAL

## 2017-01-13 MED ORDER — LIDOCAINE HCL 2 % EX GEL
1.0000 "application " | Freq: Once | CUTANEOUS | Status: AC
  Administered 2017-01-13: 1 via URETHRAL

## 2017-01-13 NOTE — Progress Notes (Signed)
   01/13/17  CC:  Chief Complaint  Patient presents with  . Cysto    HPI: 83 year old male with a history of BPH, bladder outlet obstruction, chronic urinary retention, and bladder diverticulum managed with clean intermittent catheterization who presents today for cystoscopy.  Blood pressure 132/80, pulse 62, height 6\' 1"  (1.854 m), weight 250 lb (113.4 kg). NED. A&Ox3.   No respiratory distress   Abd soft, NT, ND Normal phallus with bilateral descended testicles  Cystoscopy Procedure Note  Patient identification was confirmed, informed consent was obtained, and patient was prepped using Betadine solution.  Lidocaine jelly was administered per urethral meatus.    Preoperative abx where received prior to procedure.     Pre-Procedure: - Inspection reveals a normal caliber ureteral meatus.  Procedure: The flexible cystoscope was introduced without difficulty - No urethral strictures/lesions are present. - Enlarged prostate with irregular prostatic contour consistent with previous ablative therapy, small false pass appreciated on posterior prostatic fossa near the verumontanum l - Mildly elevated bladder neck - Bilateral ureteral orifices identified - Bladder mucosa  reveals no ulcers, tumors, or lesions - No bladder stones - Moderate trabeculation with large bladder diverticulum, relatively wide mouth to the left of the bladder, capacious  Retroflexion shows slight intravesical protrusion but no obvious median lobe   Post-Procedure: - Patient tolerated the procedure well  Assessment/ Plan:  1. Benign prostatic hyperplasia with urinary obstruction Irregular prostatic fossa, enlarged with coaptation appreciated as possible contributing factor to ongoing inability to void We'll have him return accompanied by family/friends discuss possible surgical options - Urinalysis, Complete - ciprofloxacin (CIPRO) tablet 500 mg; Take 1 tablet (500 mg total) by mouth once. - lidocaine  (XYLOCAINE) 2 % jelly 1 application; Place 1 application into the urethra once. - CULTURE, URINE COMPREHENSIVE  2. Bladder diverticulum Possible contributing factor, we'll likely address prostate with consideration of diverticulectomy in the future   Follow up to discuss surgical options  Hollice Espy, MD

## 2017-01-14 ENCOUNTER — Telehealth: Payer: Self-pay

## 2017-01-14 NOTE — Telephone Encounter (Signed)
Pt is inquiring about port removal or flush.  He states that he has not heard anything about having port removed and figured he needs to have it flushed.  Please contact about removal.

## 2017-01-18 NOTE — Telephone Encounter (Signed)
refaxed request to Dr. Jamal Collin office and will call to follow up

## 2017-01-20 LAB — CULTURE, URINE COMPREHENSIVE

## 2017-01-21 ENCOUNTER — Telehealth: Payer: Self-pay

## 2017-01-21 MED ORDER — CIPROFLOXACIN HCL 500 MG PO TABS: 500.0000 mg | ORAL_TABLET | Freq: Two times a day (BID) | ORAL | 0 refills | Status: DC

## 2017-01-21 NOTE — Telephone Encounter (Signed)
Called patient. Gave results and instructions. Patient said he did not understand. He said he was in the hospital and requested to come by here to get results in person. Agreed.

## 2017-01-21 NOTE — Telephone Encounter (Signed)
This encounter was created in error - please disregard.

## 2017-01-21 NOTE — Telephone Encounter (Signed)
-----   Message from Hollice Espy, MD sent at 01/20/2017  5:01 PM EDT ----- Please let this patient know that based on his appearance of urine in the office, we sent his urine for culture which grew Klebsiella. He needs to be treated with Cipro 500 mg twice a day 7 days. I understand that he has diarrhea with this medication, this is a side effect and not an allergy. As such, I recommended a probiotic in addition to antibiotics which should help with this.  Hollice Espy, MD

## 2017-01-24 ENCOUNTER — Encounter: Payer: Self-pay | Admitting: General Surgery

## 2017-01-24 ENCOUNTER — Ambulatory Visit (INDEPENDENT_AMBULATORY_CARE_PROVIDER_SITE_OTHER): Payer: PPO | Admitting: General Surgery

## 2017-01-24 VITALS — BP 136/74 | HR 78 | Resp 14 | Ht 73.0 in | Wt 252.0 lb

## 2017-01-24 DIAGNOSIS — Z8579 Personal history of other malignant neoplasms of lymphoid, hematopoietic and related tissues: Secondary | ICD-10-CM | POA: Diagnosis not present

## 2017-01-24 DIAGNOSIS — R339 Retention of urine, unspecified: Secondary | ICD-10-CM | POA: Diagnosis not present

## 2017-01-24 NOTE — Patient Instructions (Signed)
Implanted Port Removal, Care After °Refer to this sheet in the next few weeks. These instructions provide you with information about caring for yourself after your procedure. Your health care provider may also give you more specific instructions. Your treatment has been planned according to current medical practices, but problems sometimes occur. Call your health care provider if you have any problems or questions after your procedure. °What can I expect after the procedure? °After the procedure, it is common to have: °· Soreness or pain near your incision. °· Some swelling or bruising near your incision. ° °Follow these instructions at home: °Medicines °· Take over-the-counter and prescription medicines only as told by your health care provider. °· If you were prescribed an antibiotic medicine, take it as told by your health care provider. Do not stop taking the antibiotic even if you start to feel better. °Bathing °· Do not take baths, swim, or use a hot tub until your health care provider approves. Ask your health care provider if you can take showers. You may only be allowed to take sponge baths for bathing. °Incision care °· Follow instructions from your health care provider about how to take care of your incision. Make sure you: °? Wash your hands with soap and water before you change your bandage (dressing). If soap and water are not available, use hand sanitizer. °? Change your dressing as told by your health care provider. °? Keep your dressing dry. °? Leave stitches (sutures), skin glue, or adhesive strips in place. These skin closures may need to stay in place for 2 weeks or longer. If adhesive strip edges start to loosen and curl up, you may trim the loose edges. Do not remove adhesive strips completely unless your health care provider tells you to do that. °· Check your incision area every day for signs of infection. Check for: °? More redness, swelling, or pain. °? More fluid or  blood. °? Warmth. °? Pus or a bad smell. °Driving °· If you received a sedative, do not drive for 24 hours after the procedure. °· If you did not receive a sedative, ask your health care provider when it is safe to drive. °Activity °· Return to your normal activities as told by your health care provider. Ask your health care provider what activities are safe for you. °· Until your health care provider says it is safe: °? Do not lift anything that is heavier than 10 lb (4.5 kg). °? Do not do activities that involve lifting your arms over your head. °General instructions °· Do not use any tobacco products, such as cigarettes, chewing tobacco, and e-cigarettes. Tobacco can delay healing. If you need help quitting, ask your health care provider. °· Keep all follow-up visits as told by your health care provider. This is important. °Contact a health care provider if: °· You have more redness, swelling, or pain around your incision. °· You have more fluid or blood coming from your incision. °· Your incision feels warm to the touch. °· You have pus or a bad smell coming from your incision. °· You have a fever. °· You have pain that is not relieved by your pain medicine. °Get help right away if: °· You have chest pain. °· You have difficulty breathing. °This information is not intended to replace advice given to you by your health care provider. Make sure you discuss any questions you have with your health care provider. °Document Released: 07/21/2015 Document Revised: 01/15/2016 Document Reviewed: 05/14/2015 °Elsevier Interactive Patient   Education © 2018 [employer] ° °

## 2017-01-24 NOTE — Progress Notes (Signed)
Patient ID: Bryan Nelson, male   DOB: 11-22-1931, 83 y.o.   MRN: [id number]  Chief Complaint  Patient presents with  . Procedure    Port Removal    HPI Bryan Nelson is a 83 y.o. male is here for a port removal. Patient stopped taking Plavix on 01/20/17. Patient is taking Cipro due to a urinary infection. He has been in remission from [place] for 3 yrs now. HPI  Past Medical History:  Diagnosis Date  . Arteriosclerosis of coronary artery 08/26/2011   Overview:      a.  1999 PCI of the mid LAD with stent.      b.  2002 Cath [place]: EF 56%. RCA-distal 25/25%. Left main-50% ostial.  Left circumflex-25% OM2.  LAD-25% proximal.  75% mid.  25/25% distal. 75% D1.      c.  07/2011 PCI of RCA with DES.[place]   . Bleeding gastrointestinal 08/26/2011   Overview:      a.  Chronic abdominal pain, present improving.      b.  Duodenitis and gastritis by EGD in 2000.      c.  Pylori in 2000, treated.      d.  Gastroesophageal reflux disease.      e.  Diverticular disease.    . BP (high blood pressure) 08/26/2011  . Cancer ([place]) 2015   Lymphoma  . Heart disease   . Helicobacter pylori infection 06/18/2015   by EGD RX 08/18/1999   . History of adenomatous polyp of colon 06/18/2015    Past Surgical History:  Procedure Laterality Date  . ANGIOPLASTY    . CATARACT EXTRACTION    . COLONOSCOPY  2016  . GREEN LIGHT LASER TURP (TRANSURETHRAL RESECTION OF PROSTATE N/A 04/16/2015   Procedure: GREEN LIGHT LASER TURP (TRANSURETHRAL RESECTION OF PROSTATE WITH BLADDER BIOPSY;  Surgeon: Collier Flowers, MD;  Location: [facility] ORS;  Service: Urology;  Laterality: N/A;  . heart stent placement     1998, 2000, 2012    Family History  Problem Relation Age of Onset  . Osteoporosis Mother   . Alzheimer's disease Father   . Kidney disease Neg Hx   . Prostate cancer Neg Hx   . Bladder Cancer Neg Hx   . Kidney cancer Neg Hx     Social History Social History  Substance Use Topics  . Smoking status: Former Smoker    Packs/day: 1.50    Years: 12.00    Types: Cigarettes    Quit date: 08/24/1971  . Smokeless tobacco: Never Used  . Alcohol use 0.0 oz/week     Comment: occasional    Allergies  Allergen Reactions  . Ciprofloxacin Diarrhea  . Lisinopril Other (See Comments)  . Penicillins     unknwon reaction.  tolerates amoxicillin  . Sulfa Antibiotics Itching and Rash    Rash    Current Outpatient Prescriptions  Medication Sig Dispense Refill  . aspirin 81 MG tablet Take 81 mg by mouth daily.    Marland Kitchen atorvastatin (LIPITOR) 10 MG tablet Take 1 tablet (10 mg total) by mouth daily. 90 tablet 4  . Cholecalciferol (VITAMIN D3) 10000 units TABS Take 1 capsule by mouth daily.    . ciprofloxacin (CIPRO) 500 MG tablet Take 1 tablet (500 mg total) by mouth every 12 (twelve) hours. 14 tablet 0  . finasteride (PROSCAR) 5 MG tablet Take 1 tablet (5 mg total) by mouth daily. 90 tablet 3  . hydrochlorothiazide (HYDRODIURIL) 25 MG tablet Take 1 tablet (25  mg total) by mouth daily. 90 tablet 4  . isosorbide dinitrate (ISORDIL) 30 MG tablet Take 1 tablet (30 mg total) by mouth every morning. 90 tablet 4  . metoprolol (LOPRESSOR) 100 MG tablet Take 1 tablet (100 mg total) by mouth 2 (two) times daily. 180 tablet 4  . nitroGLYCERIN (NITROSTAT) 0.4 MG SL tablet Place 0.4 mg under the tongue every 5 (five) minutes as needed for chest pain. Reported on 03/10/2016    . omeprazole (PRILOSEC) 40 MG capsule Take 1 capsule (40 mg total) by mouth 2 (two) times daily. (Patient taking differently: Take 40 mg by mouth daily. ) 180 capsule 4  . ramipril (ALTACE) 2.5 MG capsule Take 1 capsule (2.5 mg total) by mouth daily. 90 capsule 4  . ranitidine (ZANTAC) 150 MG tablet Take 1 tablet (150 mg total) by mouth 2 (two) times daily. Reported on 03/10/2016 180 tablet 4  . sucralfate (CARAFATE) 1 g tablet Take 1 tablet (1 g total) by mouth 2 (two) times daily. 60 tablet 4  . tamsulosin (FLOMAX) 0.4 MG CAPS capsule Take 1 capsule (0.4 mg total)  by mouth daily. 90 capsule 3  . clopidogrel (PLAVIX) 75 MG tablet Take 1 tablet (75 mg total) by mouth daily. (Patient not taking: Reported on 01/24/2017) 90 tablet 4   Current Facility-Administered Medications  Medication Dose Route Frequency Provider Last Rate Last Dose  . lidocaine (XYLOCAINE) 2 % jelly 1 application  1 application Urethral Once Collier Flowers, MD        Review of Systems Review of Systems  Constitutional: Negative.   Respiratory: Negative.   Cardiovascular: Negative.     Blood pressure 136/74, pulse 78, resp. rate 14, height 6\' 1"  (1.854 m), weight 252 lb (114.3 kg).  Physical Exam Physical Exam  Constitutional: He is oriented to person, place, and time. He appears well-developed and well-nourished.  Neurological: He is alert and oriented to person, place, and time.  Skin: Skin is warm and dry.    Data Reviewed Notes and history  Assessment       Plan    Procedure note.  Procedure: Removal of Venous Port  Anesthetic: 10 mL of 0.5% marcaine mixed with 1% xylocaine  Prep: Chloro prep  Description: After the area was prepped and draped out in the left upper chest, prior incision was reopened. Capsule around the port was opened, the three anchoring stitches were removed. The port was freed and removed easily with the catheter intact. No bleeding encountered. Subq tissue closed with 3-0 vicryl. Skin closed with subcuticular 4-0 vicryl. Steri strips applied with Tr benzoin. Telfa Tegaderm dressing placed.  Procedure well tolerated with no immediate problems encountered. Patient advised on wound care.       HPI, Physical Exam, Assessment and Plan have been scribed under the direction and in the presence of Mckinley Jewel, MD.  Verlene Mayer, CMA  I have completed the exam and reviewed the above documentation for accuracy and completeness.  I agree with the above.  Haematologist has been used and any errors in dictation or transcription are  unintentional.  Candra Wegner G. Jamal Collin, M.D., F.A.C.S.     Junie Panning G 01/25/2017, 8:04 AM

## 2017-01-31 ENCOUNTER — Encounter: Payer: Self-pay | Admitting: Family Medicine

## 2017-01-31 ENCOUNTER — Ambulatory Visit (INDEPENDENT_AMBULATORY_CARE_PROVIDER_SITE_OTHER): Payer: PPO | Admitting: Family Medicine

## 2017-01-31 VITALS — BP 130/70 | HR 65 | Temp 98.0°F | Resp 16 | Wt 256.0 lb

## 2017-01-31 DIAGNOSIS — R42 Dizziness and giddiness: Secondary | ICD-10-CM | POA: Diagnosis not present

## 2017-01-31 NOTE — Progress Notes (Signed)
Patient: Bryan Nelson Male    DOB: [birth year]   83 y.o.   MRN: [id number] Visit Date: 01/31/2017  Today's Provider: Lelon Huh, MD   Chief Complaint  Patient presents with  . Dizziness   Subjective:    Dizziness  This is a new problem. Episode onset: 1 week ago. The problem occurs intermittently. The problem has been unchanged. Pertinent negatives include no abdominal pain, anorexia, arthralgias, change in bowel habit, chest pain, chills, congestion, coughing, fatigue, fever, nausea, neck pain, numbness, rash, sore throat, swollen glands, urinary symptoms, visual change or vomiting.  Patient states he becomes lightheaded when he stands quickly. Feels off balance, wants to lean to left when he walks. No headaches. Eating and drinking well. No nausea. No spinning sensation.      Allergies  Allergen Reactions  . Ciprofloxacin Diarrhea  . Lisinopril Other (See Comments)  . Penicillins     unknwon reaction.  tolerates amoxicillin  . Sulfa Antibiotics Itching and Rash    Rash     Current Outpatient Prescriptions:  .  aspirin 81 MG tablet, Take 81 mg by mouth daily., Disp: , Rfl:  .  atorvastatin (LIPITOR) 10 MG tablet, Take 1 tablet (10 mg total) by mouth daily., Disp: 90 tablet, Rfl: 4 .  Cholecalciferol (VITAMIN D3) 10000 units TABS, Take 1 capsule by mouth daily., Disp: , Rfl:  .  clopidogrel (PLAVIX) 75 MG tablet, Take 1 tablet (75 mg total) by mouth daily., Disp: 90 tablet, Rfl: 4 .  finasteride (PROSCAR) 5 MG tablet, Take 1 tablet (5 mg total) by mouth daily., Disp: 90 tablet, Rfl: 3 .  hydrochlorothiazide (HYDRODIURIL) 25 MG tablet, Take 1 tablet (25 mg total) by mouth daily., Disp: 90 tablet, Rfl: 4 .  isosorbide dinitrate (ISORDIL) 30 MG tablet, Take 1 tablet (30 mg total) by mouth every morning., Disp: 90 tablet, Rfl: 4 .  metoprolol (LOPRESSOR) 100 MG tablet, Take 1 tablet (100 mg total) by mouth 2 (two) times daily., Disp: 180 tablet, Rfl: 4 .  omeprazole  (PRILOSEC) 40 MG capsule, Take 1 capsule (40 mg total) by mouth 2 (two) times daily. (Patient taking differently: Take 40 mg by mouth daily. ), Disp: 180 capsule, Rfl: 4 .  ramipril (ALTACE) 2.5 MG capsule, Take 1 capsule (2.5 mg total) by mouth daily., Disp: 90 capsule, Rfl: 4 .  ranitidine (ZANTAC) 150 MG tablet, Take 1 tablet (150 mg total) by mouth 2 (two) times daily. Reported on 03/10/2016, Disp: 180 tablet, Rfl: 4 .  sucralfate (CARAFATE) 1 g tablet, Take 1 tablet (1 g total) by mouth 2 (two) times daily., Disp: 60 tablet, Rfl: 4 .  tamsulosin (FLOMAX) 0.4 MG CAPS capsule, Take 1 capsule (0.4 mg total) by mouth daily., Disp: 90 capsule, Rfl: 3 .  nitroGLYCERIN (NITROSTAT) 0.4 MG SL tablet, Place 0.4 mg under the tongue every 5 (five) minutes as needed for chest pain. Reported on 03/10/2016, Disp: , Rfl:   Current Facility-Administered Medications:  .  lidocaine (XYLOCAINE) 2 % jelly 1 application, 1 application, Urethral, Once, Collier Flowers, MD  Review of Systems  Constitutional: Negative for appetite change, chills, fatigue and fever.  HENT: Negative for congestion and sore throat.   Respiratory: Negative for cough, chest tightness, shortness of breath and wheezing.   Cardiovascular: Negative for chest pain and palpitations.  Gastrointestinal: Negative for abdominal pain, anorexia, change in bowel habit, nausea and vomiting.  Endocrine: Positive for polyuria.  Musculoskeletal: Negative for arthralgias  and neck pain.  Skin: Negative for rash.  Neurological: Positive for dizziness and light-headedness (off balanced). Negative for numbness.    Social History  Substance Use Topics  . Smoking status: Former Smoker    Packs/day: 1.50    Years: 12.00    Types: Cigarettes    Quit date: 08/24/1971  . Smokeless tobacco: Never Used  . Alcohol use No   Objective:   BP 130/70 (BP Location: Left Arm, Patient Position: Sitting, Cuff Size: Large)   Pulse 65   Temp 98 F (36.7 C) (Oral)    Resp 16   Wt 256 lb (116.1 kg)   SpO2 95% Comment: room air  BMI 33.78 kg/m  Vitals:   01/31/17 1555  BP: 130/70  Pulse: 65  Resp: 16  Temp: 98 F (36.7 C)  TempSrc: Oral  SpO2: 95%  Weight: 256 lb (116.1 kg)   Orthostatic: Blood pressure: Heart rate: Lying:  120/60   61 Sitting:  124/70   68 Standing:  130/70   70  Physical Exam   General Appearance:    Alert, cooperative, no distress  Eyes:    PERRL, conjunctiva/corneas clear, EOM's intact       Lungs:     Clear to auscultation bilaterally, respirations unlabored  Heart:    Regular rate and rhythm  Neurologic:   Awake, alert, oriented x 3. No apparent focal neurological           defect. Normal finger to nose. Difficulty with tandem gait. No nystagmus.           Assessment & Plan:     1. Dizziness A bit of ataxia. Considering history of prostate cancer will need to consider neuroimaging if there is no clear explanation of symptoms when labs are resulted.  - Comprehensive metabolic panel - CBC       Lelon Huh, MD  [facility]

## 2017-02-01 LAB — COMPREHENSIVE METABOLIC PANEL
A/G RATIO: 2 (ref 1.2–2.2)
ALT: 22 IU/L (ref 0–44)
AST: 18 IU/L (ref 0–40)
Albumin: 4.3 g/dL (ref 3.5–4.7)
Alkaline Phosphatase: 68 IU/L (ref 39–117)
BUN/Creatinine Ratio: 20 (ref 10–24)
BUN: 23 mg/dL (ref 8–27)
CALCIUM: 9.4 mg/dL (ref 8.6–10.2)
CHLORIDE: 101 mmol/L (ref 96–106)
CO2: 24 mmol/L (ref 20–29)
Creatinine, Ser: 1.17 mg/dL (ref 0.76–1.27)
GFR, EST AFRICAN AMERICAN: 66 mL/min/{1.73_m2} (ref >59)
GFR, EST NON AFRICAN AMERICAN: 57 mL/min/{1.73_m2} — AB (ref >59)
GLOBULIN, TOTAL: 2.2 g/dL (ref 1.5–4.5)
Glucose: 89 mg/dL (ref 65–99)
POTASSIUM: 4 mmol/L (ref 3.5–5.2)
SODIUM: 141 mmol/L (ref 134–144)
TOTAL PROTEIN: 6.5 g/dL (ref 6.0–8.5)

## 2017-02-01 LAB — CBC
Hematocrit: 37.5 % (ref 37.5–51.0)
Hemoglobin: 12.4 g/dL — ABNORMAL LOW (ref 13.0–17.7)
MCH: 28.8 pg (ref 26.6–33.0)
MCHC: 33.1 g/dL (ref 31.5–35.7)
MCV: 87 fL (ref 79–97)
PLATELETS: 196 10*3/uL (ref 150–379)
RBC: 4.31 x10E6/uL (ref 4.14–5.80)
RDW: 15.7 % — AB (ref 12.3–15.4)
WBC: 6.1 10*3/uL (ref 3.4–10.8)

## 2017-02-02 ENCOUNTER — Telehealth: Payer: Self-pay | Admitting: Family Medicine

## 2017-02-02 DIAGNOSIS — R42 Dizziness and giddiness: Secondary | ICD-10-CM

## 2017-02-02 NOTE — Telephone Encounter (Signed)
That could be due to metoprolol. He can try reducing dose to 50mg  (1/2 tablet)  Twice daily to see if that helps. He should be able to tell difference in 2-3 days.

## 2017-02-02 NOTE — Telephone Encounter (Signed)
Patent was notified. Expressed understanding.

## 2017-02-02 NOTE — Telephone Encounter (Signed)
Patient was notified of results. Expressed understanding. Patient is agreeable to CT scan of head.

## 2017-02-02 NOTE — Telephone Encounter (Signed)
Patient stated that he has noticed that when his heart rate drops below 60 and his blood pressure drops down to 110/50 he feels sob and lightheaded. Patient wants to know if this could be one of his medications causes this? Please advise?

## 2017-02-02 NOTE — Telephone Encounter (Signed)
Pt is requesting a call back to get his lab results from 01/31/17. Please advise. Thanks TNP

## 2017-02-02 NOTE — Telephone Encounter (Signed)
-----   Message from [name], MD sent at 02/02/2017  1:18 PM EDT ----- Labs all completely normal. If dizziness is not improving then needs head CT with contrast for further evaluation.

## 2017-02-02 NOTE — Telephone Encounter (Signed)
Please advise results? 

## 2017-02-03 ENCOUNTER — Encounter: Payer: Self-pay | Admitting: Physician Assistant

## 2017-02-04 NOTE — Progress Notes (Signed)
Order(s) created erroneously. Erroneous order ID: [id number]  Order moved by: Pete Pelt  Order move date/time: 02/04/2017 2:53 PM  Source Patient: [id number]  Source Contact: 02/03/2017  Destination Patient: [id number]  Destination Contact: 06/23/2014

## 2017-02-08 DIAGNOSIS — R42 Dizziness and giddiness: Secondary | ICD-10-CM | POA: Diagnosis not present

## 2017-02-08 DIAGNOSIS — R0602 Shortness of breath: Secondary | ICD-10-CM | POA: Diagnosis not present

## 2017-02-08 DIAGNOSIS — K219 Gastro-esophageal reflux disease without esophagitis: Secondary | ICD-10-CM | POA: Diagnosis not present

## 2017-02-08 DIAGNOSIS — R011 Cardiac murmur, unspecified: Secondary | ICD-10-CM | POA: Diagnosis not present

## 2017-02-08 DIAGNOSIS — E669 Obesity, unspecified: Secondary | ICD-10-CM | POA: Diagnosis not present

## 2017-02-08 DIAGNOSIS — I2510 Atherosclerotic heart disease of native coronary artery without angina pectoris: Secondary | ICD-10-CM | POA: Diagnosis not present

## 2017-02-08 DIAGNOSIS — E782 Mixed hyperlipidemia: Secondary | ICD-10-CM | POA: Diagnosis not present

## 2017-02-08 DIAGNOSIS — I10 Essential (primary) hypertension: Secondary | ICD-10-CM | POA: Diagnosis not present

## 2017-02-09 ENCOUNTER — Ambulatory Visit (INDEPENDENT_AMBULATORY_CARE_PROVIDER_SITE_OTHER): Payer: PPO | Admitting: Urology

## 2017-02-09 ENCOUNTER — Encounter: Payer: Self-pay | Admitting: Urology

## 2017-02-09 ENCOUNTER — Telehealth: Payer: Self-pay | Admitting: Radiology

## 2017-02-09 ENCOUNTER — Other Ambulatory Visit: Payer: Self-pay | Admitting: Radiology

## 2017-02-09 VITALS — BP 148/72 | HR 58 | Ht 73.0 in | Wt 250.0 lb

## 2017-02-09 DIAGNOSIS — R339 Retention of urine, unspecified: Secondary | ICD-10-CM | POA: Diagnosis not present

## 2017-02-09 DIAGNOSIS — N401 Enlarged prostate with lower urinary tract symptoms: Secondary | ICD-10-CM

## 2017-02-09 DIAGNOSIS — N390 Urinary tract infection, site not specified: Secondary | ICD-10-CM

## 2017-02-09 DIAGNOSIS — N138 Other obstructive and reflux uropathy: Secondary | ICD-10-CM

## 2017-02-09 DIAGNOSIS — N323 Diverticulum of bladder: Secondary | ICD-10-CM

## 2017-02-09 NOTE — Telephone Encounter (Signed)
Notified pt of TURP scheduled with Dr Erlene Quan on 03/02/17, pre-admit testing appt on 02/18/17 @10 :30 & to call day prior to surgery for arrival time to SDS. Pt has no questions at this time & voices understanding.

## 2017-02-09 NOTE — Progress Notes (Signed)
02/09/2017 2:13 PM   [name] August 01, 1932 [id number]  Referring provider: [name], MD [address] [place], [place] [address]   HPI: 83 year old male with history of BPH, chronic urinary retention currently managed with intermittent catheterization who returns today following cystoscopy to discuss possible surgical intervention for his ongoing urinary issues.  Patient underwent laser ablation of his prostate, green light by Dr. Elnoria Howard in 2016. Since then, he has had significant issues including chronic urinary retention managed by intermittent catheterization. His also had recurrent urinary tract infections presumably related to self catheter and self cathetering technique.  More recently, he had issues passing his catheter requiring use of a coud.  He describes occasional issues with meeting resistance, and occasionally sees blood when catheter placement is uncomfortable.  He is currently on maximal medical therapy including Flomax and finasteride. Despite this, he is not voiding.  The patient reports that even before prostate surgery, he is having difficulty emptying his bladder. He was previously managed by Dr. Bernardo Heater at [facility] urology. He's also had urodynamics in the past but unable to generate contraction therefore no pressure flow study was performed.  Most recent CT scan from 03/14/2018shows persistent prostamegaly without significant TURP defect. He also has a thickened bladder wall and a large bladder diverticulum on the left. Calculated prostate volume 69 cc.  PSA  0.81 11/22/16.  Most recently, cystoscopy revealed an irregular prostatic contour with bilateral coaptation, small posterior prostatic fossa false pass, and a large wide mouth bladder diverticulum on the left.  Past medical history is significant for coronary artery disease on aspirin/Plavix. He was able to stop this under the care of Dr. Maryanna Shape 1 month ago for the purpose of port  removal.  PMH: Past Medical History:  Diagnosis Date  . Arteriosclerosis of coronary artery 08/26/2011   Overview:      a.  1999 PCI of the mid LAD with stent.      b.  2002 [facility] [place]: EF 56%. RCA-distal 25/25%. Left main-50% ostial.  Left circumflex-25% OM2.  LAD-25% proximal.  75% mid.  25/25% distal. 75% D1.      c.  07/2011 PCI of RCA with DES.[place]   . Bleeding gastrointestinal 08/26/2011   Overview:      a.  Chronic abdominal pain, present improving.      b.  Duodenitis and gastritis by EGD in 2000.      c.  Pylori in 2000, treated.      d.  Gastroesophageal reflux disease.      e.  Diverticular disease.    . BP (high blood pressure) 08/26/2011  . Cancer ([place]) 2015   Lymphoma  . Heart disease   . Helicobacter pylori infection 06/18/2015   by EGD RX 08/18/1999   . History of adenomatous polyp of colon 06/18/2015    Surgical History: Past Surgical History:  Procedure Laterality Date  . ANGIOPLASTY    . CATARACT EXTRACTION    . COLONOSCOPY  2016  . GREEN LIGHT LASER TURP (TRANSURETHRAL RESECTION OF PROSTATE N/A 04/16/2015   Procedure: GREEN LIGHT LASER TURP (TRANSURETHRAL RESECTION OF PROSTATE WITH BLADDER BIOPSY;  Surgeon: Collier Flowers, MD;  Location: [facility] ORS;  Service: Urology;  Laterality: N/A;  . heart stent placement     1998, 2000, 2012    Home Medications:  Allergies as of 02/09/2017      Reactions   Ciprofloxacin Diarrhea   Lisinopril Other (See Comments)   Penicillins    unknwon  reaction.  tolerates amoxicillin   Sulfa Antibiotics Itching, Rash   Rash      Medication List       Accurate as of 02/09/17  2:13 PM. Always use your most recent med list.          aspirin 81 MG tablet Take 81 mg by mouth daily.   atorvastatin 10 MG tablet Commonly known as:  LIPITOR Take 1 tablet (10 mg total) by mouth daily.   clopidogrel 75 MG tablet Commonly known as:  PLAVIX Take 1 tablet (75 mg total) by mouth daily.   finasteride 5 MG tablet Commonly known as:   PROSCAR Take 1 tablet (5 mg total) by mouth daily.   isosorbide dinitrate 30 MG tablet Commonly known as:  ISORDIL Take 1 tablet (30 mg total) by mouth every morning.   metoprolol tartrate 100 MG tablet Commonly known as:  LOPRESSOR Take 1 tablet (100 mg total) by mouth 2 (two) times daily.   nitroGLYCERIN 0.4 MG SL tablet Commonly known as:  NITROSTAT Place 0.4 mg under the tongue every 5 (five) minutes as needed for chest pain. Reported on 03/10/2016   omeprazole 40 MG capsule Commonly known as:  PRILOSEC Take 1 capsule (40 mg total) by mouth 2 (two) times daily.   ramipril 2.5 MG capsule Commonly known as:  ALTACE Take 1 capsule (2.5 mg total) by mouth daily.   ranitidine 150 MG tablet Commonly known as:  ZANTAC Take 1 tablet (150 mg total) by mouth 2 (two) times daily. Reported on 03/10/2016   sucralfate 1 g tablet Commonly known as:  CARAFATE Take 1 tablet (1 g total) by mouth 2 (two) times daily.   tamsulosin 0.4 MG Caps capsule Commonly known as:  FLOMAX Take 1 capsule (0.4 mg total) by mouth daily.   Vitamin D3 10000 units Tabs Take 1 capsule by mouth daily.       Allergies:  Allergies  Allergen Reactions  . Ciprofloxacin Diarrhea  . Lisinopril Other (See Comments)  . Penicillins     unknwon reaction.  tolerates amoxicillin  . Sulfa Antibiotics Itching and Rash    Rash    Family History: Family History  Problem Relation Age of Onset  . Osteoporosis Mother   . Alzheimer's disease Father   . Kidney disease Neg Hx   . Prostate cancer Neg Hx   . Bladder Cancer Neg Hx   . Kidney cancer Neg Hx     Social History:  reports that he quit smoking about 45 years ago. His smoking use included Cigarettes. He has a 18.00 pack-year smoking history. He has never used smokeless tobacco. He reports that he does not drink alcohol or use drugs.  ROS: UROLOGY Frequent Urination?: No Hard to postpone urination?: No Burning/pain with urination?: No Get up at night  to urinate?: No Leakage of urine?: No Urine stream starts and stops?: No Trouble starting stream?: No Do you have to strain to urinate?: No Blood in urine?: No Urinary tract infection?: No Sexually transmitted disease?: No Injury to kidneys or bladder?: No Painful intercourse?: No Weak stream?: No Erection problems?: No Penile pain?: No  Gastrointestinal Nausea?: No Vomiting?: No Indigestion/heartburn?: No Diarrhea?: No Constipation?: No  Constitutional Fever: No Night sweats?: No Weight loss?: No Fatigue?: No  Skin Skin rash/lesions?: No Itching?: No  Eyes Blurred vision?: No Double vision?: No  Ears/Nose/Throat Sore throat?: No Sinus problems?: No  Hematologic/Lymphatic Swollen glands?: No Easy bruising?: No  Cardiovascular Leg swelling?: No Chest pain?:  No  Respiratory Cough?: No Shortness of breath?: No  Endocrine Excessive thirst?: No  Musculoskeletal Back pain?: No Joint pain?: No  Neurological Headaches?: No Dizziness?: No  Psychologic Depression?: No Anxiety?: No  Physical Exam: BP (!) 148/72 (BP Location: Left Arm, Patient Position: Sitting, Cuff Size: Large)   Pulse (!) 58   Ht 6\' 1"  (1.854 m)   Wt 250 lb (113.4 kg)   BMI 32.98 kg/m   Constitutional:  Alert and oriented, No acute distress.  Accompanied by friend/ family member today.    HEENT: [place] AT, moist mucus membranes.  Trachea midline, no masses. Cardiovascular: No clubbing, cyanosis, or edema. Respiratory: Normal respiratory effort, no increased work of breathing. Skin: No rashes, bruises or suspicious lesions. Neurologic: Grossly intact, no focal deficits, moving all 4 extremities. Psychiatric: Normal mood and affect.  Laboratory Data: Lab Results  Component Value Date   WBC 6.1 01/31/2017   HGB 12.4 (L) 01/31/2017   HCT 37.5 01/31/2017   MCV 87 01/31/2017   PLT 196 01/31/2017    Lab Results  Component Value Date   CREATININE 1.17 01/31/2017    Lab  Results  Component Value Date   PSA 0.81 11/22/2016   PSA 0.9 02/27/2014    Lab Results  Component Value Date   HGBA1C 5.9 11/22/2016    Urinalysis N/a  Pertinent Imaging: No new imaging  Assessment & Plan:   83 year old male with chronic urinary retention managed with clean intermittent catheterization who presents today to discuss his options.   In general, continue to suspect that his voiding dysfunction is multifactorial. He does indeed have an enlarged objective appearing prostate but also has sequela of bladder dysfunction including a large bladder diverticulum which is likely contributing to his retention/inability to empty bladder.  This was confirmed on cystoscopy last month.  Lengthy discussion today about options moving forward. He was offered a redo TURP given cystoscopic findings and ongoing retention. We discussed risk of the procedure at length including bleeding, infection, damage is running structures, stress incontinence, ejaculatory dysfunction, failure of the procedure to resolve or improve retention, stricture, amongst others. All questions were answered. He'll need cardiac clearance to hold his aspirin and Plavix for the procedure. We'll plan on setting up home with a catheter for at least a week to allow his prosthetic urethra to heal and bladder to rest.  Specifically today, we discussed that proceeding with TURP may or may not improve his bladder function or allow him to empty his bladder spontaneously. We discussed the large bladder diverticulum as a contributing factor to his symptoms. He may have some improvement with ability to self catheter by making his prostatic urethra more regular.  1. Urine retention As above  Continue daily clean intermittent catheter using coud tip catheter  2. Recurrent UTI Bacterial colonization related to clean intermittent catheterization Large bladder diverticula which may not be emptying completely with catheterization also  possible contribute in factor We'll continue to treat symptomatically as needed  3. Benign prostatic hyperplasia with urinary obstruction Continue Flomax and finasteride  4. Bladder diverticulum Appreciated on most recent CT scan, likely related to chronic outlet obstruction   Hollice Espy, MD  [address] [place], [place] [address] [phone]  I spent 25 min with this patient of which greater than 50% was spent in counseling and coordination of care with the patient.

## 2017-02-18 ENCOUNTER — Encounter
Admission: RE | Admit: 2017-02-18 | Discharge: 2017-02-18 | Disposition: A | Discharge status: Home / Self Care | Payer: PPO | Source: Ambulatory Visit | Attending: Urology | Admitting: Urology

## 2017-02-18 DIAGNOSIS — R338 Other retention of urine: Secondary | ICD-10-CM | POA: Insufficient documentation

## 2017-02-18 DIAGNOSIS — Z01812 Encounter for preprocedural laboratory examination: Secondary | ICD-10-CM | POA: Insufficient documentation

## 2017-02-18 DIAGNOSIS — N401 Enlarged prostate with lower urinary tract symptoms: Secondary | ICD-10-CM | POA: Diagnosis not present

## 2017-02-18 HISTORY — DX: Unspecified hearing loss, unspecified ear: H91.90

## 2017-02-18 LAB — BASIC METABOLIC PANEL
Anion gap: 9 (ref 5–15)
BUN: 18 mg/dL (ref 6–20)
CO2: 23 mmol/L (ref 22–32)
Calcium: 8.4 mg/dL — ABNORMAL LOW (ref 8.9–10.3)
Chloride: 107 mmol/L (ref 101–111)
Creatinine, Ser: 1.14 mg/dL (ref 0.61–1.24)
GFR calc Af Amer: >60 mL/min (ref >60)
GFR calc non Af Amer: 58 mL/min — ABNORMAL LOW (ref >60)
Glucose, Bld: 90 mg/dL (ref 65–99)
POTASSIUM: 4.4 mmol/L (ref 3.5–5.1)
SODIUM: 139 mmol/L (ref 135–145)

## 2017-02-18 LAB — CBC
HEMATOCRIT: 37.9 % — AB (ref 40.0–52.0)
Hemoglobin: 12.8 g/dL — ABNORMAL LOW (ref 13.0–18.0)
MCH: 29.7 pg (ref 26.0–34.0)
MCHC: 33.7 g/dL (ref 32.0–36.0)
MCV: 88.3 fL (ref 80.0–100.0)
Platelets: 170 10*3/uL (ref 150–440)
RBC: 4.3 MIL/uL — AB (ref 4.40–5.90)
RDW: 15.4 % — ABNORMAL HIGH (ref 11.5–14.5)
WBC: 5.4 10*3/uL (ref 3.8–10.6)

## 2017-02-18 LAB — URINALYSIS, ROUTINE W REFLEX MICROSCOPIC
BILIRUBIN URINE: NEGATIVE
GLUCOSE, UA: NEGATIVE mg/dL
HGB URINE DIPSTICK: NEGATIVE
Ketones, ur: NEGATIVE mg/dL
Leukocytes, UA: NEGATIVE
Nitrite: NEGATIVE
PH: 5 (ref 5.0–8.0)
Protein, ur: NEGATIVE mg/dL
Specific Gravity, Urine: 1.023 (ref 1.005–1.030)

## 2017-02-18 NOTE — Patient Instructions (Addendum)
  Your procedure is scheduled on: March 02, 2017 [facility]) Report to Same Day Surgery 2nd floor medical mall ([place] Entrance-take elevator on left to 2nd floor.  Check in with surgery information desk.) To find out your arrival time please call [phone] between 1PM - 3PM on March 01, 2017 (TUESDAY)   Remember: Instructions that are not followed completely may result in serious medical risk, up to and including death, or upon the discretion of your surgeon and anesthesiologist your surgery may need to be rescheduled.    _x___ 1. Do not eat food or drink liquids after midnight. No gum chewing or  hard candies                            __x__ 2. No Alcohol for 24 hours before or after surgery.   __x__3. No Smoking for 24 prior to surgery.   ____  4. Bring all medications with you on the day of surgery if instructed.    __x__ 5. Notify your doctor if there is any change in your medical condition     (cold, fever, infections).     Do not wear jewelry, make-up, hairpins, clips or nail polish.  Do not wear lotions, powders, or perfumes. You may wear deodorant.  Do not shave 48 hours prior to surgery. Men may shave face and neck.  Do not bring valuables to the hospital.    [facility] is not responsible for any belongings or valuables.               Contacts, dentures or bridgework may not be worn into surgery.  Leave your suitcase in the car. After surgery it may be brought to your room.  For patients admitted to the hospital, discharge time is determined by your treatment team                  Patients discharged the day of surgery will not be allowed to drive home.  You will need someone to drive you home and stay with you the night of your procedure.    Please read over the following fact sheets that you were given:   [facility] Preparing for Surgery and or MRSA Information   _x___ Take the following medications with a sip of water the morning of surgery :    1.ISOSORBIDE  DINITRATE  2.METOPROLOL  3.OMEPRAZOLE  4.RAMIPRIL    ____Fleets enema or Magnesium Citrate as directed.   ___ Use CHG Soap or sage wipes as directed on instruction sheet   ____ Use inhalers on the day of surgery and bring to hospital day of surgery  ____ Stop Metformin and Janumet 2 days prior to surgery.    ____ Take 1/2 of usual insulin dose the night before surgery and none on the morning surgery     _x___ Follow recommendations from Cardiologist, Pulmonologist or PCP regarding          stopping Aspirin, Coumadin, Plavix ,Eliquis, Effient, or Pradaxa, and Pletal. (ASK DR Erlene Quan OFFICE WHEN YOU NEED TO STOP ASPIRIN AND PLAVIX )  X____Stop Anti-inflammatories such as Advil, Aleve, Ibuprofen, Motrin, Naproxen, Naprosyn, Goodies powders or aspirin products. OK to take Tylenol    _x___ Stop supplements until after surgery.  But may continue Vitamin D, Vitamin B,and multivitamin         ____ Bring C-Pap to the hospital.

## 2017-02-19 LAB — URINE CULTURE: Culture: NO GROWTH

## 2017-02-21 ENCOUNTER — Telehealth: Payer: Self-pay | Admitting: Family Medicine

## 2017-02-21 NOTE — Telephone Encounter (Signed)
Pt stated that he was returning a missed call. Pt request a call back. Thanks TNP

## 2017-02-21 NOTE — Telephone Encounter (Signed)
Advised pt to hold Plavix & ASA 81mg  beginning 02/23/17 per [place] at Dr [facility] office. Pt voices understanding.

## 2017-02-25 ENCOUNTER — Ambulatory Visit: Admission: RE | Admit: 2017-02-25 | Payer: PPO | Source: Ambulatory Visit

## 2017-02-25 DIAGNOSIS — R0602 Shortness of breath: Secondary | ICD-10-CM | POA: Diagnosis not present

## 2017-02-25 DIAGNOSIS — R011 Cardiac murmur, unspecified: Secondary | ICD-10-CM | POA: Diagnosis not present

## 2017-03-01 MED ORDER — GENTAMICIN IN SALINE 1.6-0.9 MG/ML-% IV SOLN
80.0000 mg | INTRAVENOUS | Status: AC
  Administered 2017-03-02: 80 mg via INTRAVENOUS
  Filled 2017-03-01: qty 50

## 2017-03-02 ENCOUNTER — Ambulatory Visit: Payer: PPO

## 2017-03-02 ENCOUNTER — Encounter: Payer: Self-pay | Admitting: *Deleted

## 2017-03-02 ENCOUNTER — Observation Stay
Admission: RE | Admit: 2017-03-02 | Discharge: 2017-03-03 | Disposition: A | Discharge status: Home / Self Care | Payer: PPO | Source: Ambulatory Visit | Attending: Urology | Admitting: Urology

## 2017-03-02 ENCOUNTER — Ambulatory Visit: Payer: PPO | Admitting: Certified Registered Nurse Anesthetist

## 2017-03-02 ENCOUNTER — Encounter
Admission: RE | Disposition: A | Discharge status: Home / Self Care | Payer: Self-pay | Source: Ambulatory Visit | Attending: Urology

## 2017-03-02 DIAGNOSIS — I10 Essential (primary) hypertension: Secondary | ICD-10-CM | POA: Insufficient documentation

## 2017-03-02 DIAGNOSIS — J449 Chronic obstructive pulmonary disease, unspecified: Secondary | ICD-10-CM | POA: Insufficient documentation

## 2017-03-02 DIAGNOSIS — N400 Enlarged prostate without lower urinary tract symptoms: Secondary | ICD-10-CM | POA: Diagnosis not present

## 2017-03-02 DIAGNOSIS — Z79899 Other long term (current) drug therapy: Secondary | ICD-10-CM | POA: Insufficient documentation

## 2017-03-02 DIAGNOSIS — Z880 Allergy status to penicillin: Secondary | ICD-10-CM | POA: Insufficient documentation

## 2017-03-02 DIAGNOSIS — N320 Bladder-neck obstruction: Secondary | ICD-10-CM | POA: Insufficient documentation

## 2017-03-02 DIAGNOSIS — Z7982 Long term (current) use of aspirin: Secondary | ICD-10-CM | POA: Insufficient documentation

## 2017-03-02 DIAGNOSIS — Z7902 Long term (current) use of antithrombotics/antiplatelets: Secondary | ICD-10-CM | POA: Diagnosis not present

## 2017-03-02 DIAGNOSIS — Z882 Allergy status to sulfonamides status: Secondary | ICD-10-CM | POA: Insufficient documentation

## 2017-03-02 DIAGNOSIS — N401 Enlarged prostate with lower urinary tract symptoms: Secondary | ICD-10-CM | POA: Diagnosis not present

## 2017-03-02 DIAGNOSIS — R338 Other retention of urine: Secondary | ICD-10-CM | POA: Insufficient documentation

## 2017-03-02 DIAGNOSIS — I2510 Atherosclerotic heart disease of native coronary artery without angina pectoris: Secondary | ICD-10-CM | POA: Insufficient documentation

## 2017-03-02 DIAGNOSIS — Z6832 Body mass index (BMI) 32.0-32.9, adult: Secondary | ICD-10-CM | POA: Insufficient documentation

## 2017-03-02 DIAGNOSIS — K219 Gastro-esophageal reflux disease without esophagitis: Secondary | ICD-10-CM | POA: Diagnosis not present

## 2017-03-02 DIAGNOSIS — N323 Diverticulum of bladder: Secondary | ICD-10-CM | POA: Diagnosis not present

## 2017-03-02 DIAGNOSIS — Z8572 Personal history of non-Hodgkin lymphomas: Secondary | ICD-10-CM | POA: Diagnosis not present

## 2017-03-02 DIAGNOSIS — N138 Other obstructive and reflux uropathy: Secondary | ICD-10-CM | POA: Diagnosis not present

## 2017-03-02 DIAGNOSIS — E785 Hyperlipidemia, unspecified: Secondary | ICD-10-CM | POA: Insufficient documentation

## 2017-03-02 DIAGNOSIS — Z888 Allergy status to other drugs, medicaments and biological substances status: Secondary | ICD-10-CM | POA: Insufficient documentation

## 2017-03-02 DIAGNOSIS — Z955 Presence of coronary angioplasty implant and graft: Secondary | ICD-10-CM | POA: Diagnosis not present

## 2017-03-02 DIAGNOSIS — E559 Vitamin D deficiency, unspecified: Secondary | ICD-10-CM | POA: Insufficient documentation

## 2017-03-02 DIAGNOSIS — N529 Male erectile dysfunction, unspecified: Secondary | ICD-10-CM | POA: Diagnosis not present

## 2017-03-02 DIAGNOSIS — Z881 Allergy status to other antibiotic agents status: Secondary | ICD-10-CM | POA: Insufficient documentation

## 2017-03-02 DIAGNOSIS — G4700 Insomnia, unspecified: Secondary | ICD-10-CM | POA: Insufficient documentation

## 2017-03-02 DIAGNOSIS — Z87440 Personal history of urinary (tract) infections: Secondary | ICD-10-CM | POA: Diagnosis not present

## 2017-03-02 DIAGNOSIS — Z87891 Personal history of nicotine dependence: Secondary | ICD-10-CM | POA: Insufficient documentation

## 2017-03-02 HISTORY — PX: TRANSURETHRAL RESECTION OF PROSTATE: SHX73

## 2017-03-02 SURGERY — TURP (TRANSURETHRAL RESECTION OF PROSTATE)
Anesthesia: General | Site: Prostate | Wound class: Clean Contaminated

## 2017-03-02 MED ORDER — ZOLPIDEM TARTRATE 5 MG PO TABS
5.0000 mg | ORAL_TABLET | Freq: Every evening | ORAL | Status: DC | PRN
  Administered 2017-03-02: 5 mg via ORAL
  Filled 2017-03-02: qty 1

## 2017-03-02 MED ORDER — BELLADONNA ALKALOIDS-OPIUM 16.2-60 MG RE SUPP: 1.0000 | Freq: Four times a day (QID) | RECTAL | Status: DC | PRN

## 2017-03-02 MED ORDER — PANTOPRAZOLE SODIUM 40 MG PO TBEC: 80.0000 mg | DELAYED_RELEASE_TABLET | Freq: Every day | ORAL | Status: DC

## 2017-03-02 MED ORDER — METOPROLOL TARTRATE 50 MG PO TABS
50.0000 mg | ORAL_TABLET | Freq: Two times a day (BID) | ORAL | Status: DC
  Administered 2017-03-02: 50 mg via ORAL
  Filled 2017-03-02: qty 1

## 2017-03-02 MED ORDER — EPHEDRINE SULFATE 50 MG/ML IJ SOLN
INTRAMUSCULAR | Status: AC
  Filled 2017-03-02: qty 1

## 2017-03-02 MED ORDER — RAMIPRIL 2.5 MG PO CAPS
2.5000 mg | ORAL_CAPSULE | Freq: Every day | ORAL | Status: DC
  Filled 2017-03-02: qty 1

## 2017-03-02 MED ORDER — ATORVASTATIN CALCIUM 10 MG PO TABS
10.0000 mg | ORAL_TABLET | Freq: Every day | ORAL | Status: DC
  Administered 2017-03-02: 10 mg via ORAL
  Filled 2017-03-02: qty 1

## 2017-03-02 MED ORDER — MORPHINE SULFATE (PF) 2 MG/ML IV SOLN: 2.0000 mg | INTRAVENOUS | Status: DC | PRN

## 2017-03-02 MED ORDER — DEXAMETHASONE SODIUM PHOSPHATE 10 MG/ML IJ SOLN
INTRAMUSCULAR | Status: AC
  Filled 2017-03-02: qty 1

## 2017-03-02 MED ORDER — OXYCODONE-ACETAMINOPHEN 5-325 MG PO TABS
1.0000 | ORAL_TABLET | ORAL | Status: DC | PRN
  Administered 2017-03-02: 1 via ORAL
  Filled 2017-03-02: qty 1

## 2017-03-02 MED ORDER — ISOSORBIDE DINITRATE 30 MG PO TABS
30.0000 mg | ORAL_TABLET | ORAL | Status: DC
  Administered 2017-03-03: 30 mg via ORAL
  Filled 2017-03-02: qty 1

## 2017-03-02 MED ORDER — OXYBUTYNIN CHLORIDE 5 MG PO TABS
5.0000 mg | ORAL_TABLET | Freq: Three times a day (TID) | ORAL | Status: DC | PRN
  Administered 2017-03-02: 5 mg via ORAL
  Filled 2017-03-02: qty 1

## 2017-03-02 MED ORDER — SODIUM CHLORIDE 0.9 % IV SOLN
INTRAVENOUS | Status: DC
  Administered 2017-03-02 – 2017-03-03 (×3): via INTRAVENOUS

## 2017-03-02 MED ORDER — ONDANSETRON HCL 4 MG/2ML IJ SOLN: 4.0000 mg | Freq: Once | INTRAMUSCULAR | Status: DC | PRN

## 2017-03-02 MED ORDER — DOCUSATE SODIUM 100 MG PO CAPS
100.0000 mg | ORAL_CAPSULE | Freq: Two times a day (BID) | ORAL | Status: DC
  Administered 2017-03-02 – 2017-03-03 (×2): 100 mg via ORAL
  Filled 2017-03-02 (×2): qty 1

## 2017-03-02 MED ORDER — LIDOCAINE HCL (CARDIAC) 20 MG/ML IV SOLN
INTRAVENOUS | Status: DC | PRN
  Administered 2017-03-02: 50 mg via INTRAVENOUS

## 2017-03-02 MED ORDER — FAMOTIDINE 20 MG PO TABS
20.0000 mg | ORAL_TABLET | Freq: Two times a day (BID) | ORAL | Status: DC
  Administered 2017-03-02: 20 mg via ORAL
  Filled 2017-03-02: qty 1

## 2017-03-02 MED ORDER — FINASTERIDE 5 MG PO TABS
5.0000 mg | ORAL_TABLET | Freq: Every day | ORAL | Status: DC
  Administered 2017-03-02 – 2017-03-03 (×2): 5 mg via ORAL
  Filled 2017-03-02 (×2): qty 1

## 2017-03-02 MED ORDER — DEXAMETHASONE SODIUM PHOSPHATE 10 MG/ML IJ SOLN
INTRAMUSCULAR | Status: DC | PRN
  Administered 2017-03-02: 5 mg via INTRAVENOUS

## 2017-03-02 MED ORDER — DIPHENHYDRAMINE HCL 50 MG/ML IJ SOLN: 12.5000 mg | Freq: Four times a day (QID) | INTRAMUSCULAR | Status: DC | PRN

## 2017-03-02 MED ORDER — EPHEDRINE SULFATE 50 MG/ML IJ SOLN
INTRAMUSCULAR | Status: DC | PRN
Start: 2017-03-02 — End: 2017-03-02
  Administered 2017-03-02: 15 mg via INTRAVENOUS

## 2017-03-02 MED ORDER — ONDANSETRON HCL 4 MG/2ML IJ SOLN: 4.0000 mg | INTRAMUSCULAR | Status: DC | PRN

## 2017-03-02 MED ORDER — PROPOFOL 10 MG/ML IV BOLUS
INTRAVENOUS | Status: DC | PRN
  Administered 2017-03-02: 130 mg via INTRAVENOUS

## 2017-03-02 MED ORDER — ONDANSETRON HCL 4 MG/2ML IJ SOLN
INTRAMUSCULAR | Status: DC | PRN
  Administered 2017-03-02: 4 mg via INTRAVENOUS

## 2017-03-02 MED ORDER — TAMSULOSIN HCL 0.4 MG PO CAPS
0.4000 mg | ORAL_CAPSULE | Freq: Every day | ORAL | Status: DC
  Administered 2017-03-02 – 2017-03-03 (×2): 0.4 mg via ORAL
  Filled 2017-03-02 (×2): qty 1

## 2017-03-02 MED ORDER — FENTANYL CITRATE (PF) 100 MCG/2ML IJ SOLN
25.0000 ug | INTRAMUSCULAR | Status: DC | PRN
  Administered 2017-03-02 (×4): 25 ug via INTRAVENOUS

## 2017-03-02 MED ORDER — LIDOCAINE HCL (PF) 2 % IJ SOLN
INTRAMUSCULAR | Status: AC
  Filled 2017-03-02: qty 2

## 2017-03-02 MED ORDER — ACETAMINOPHEN 325 MG PO TABS: 650.0000 mg | ORAL_TABLET | ORAL | Status: DC | PRN

## 2017-03-02 MED ORDER — SODIUM CHLORIDE 0.9 % IV SOLN
1.0000 g | INTRAVENOUS | Status: AC
  Administered 2017-03-02: 1 g via INTRAVENOUS

## 2017-03-02 MED ORDER — SODIUM CHLORIDE 0.9 % IV SOLN
INTRAVENOUS | Status: AC
  Filled 2017-03-02: qty 1000

## 2017-03-02 MED ORDER — HEPARIN SODIUM (PORCINE) 5000 UNIT/ML IJ SOLN
5000.0000 [IU] | Freq: Three times a day (TID) | INTRAMUSCULAR | Status: DC
  Administered 2017-03-02 – 2017-03-03 (×2): 5000 [IU] via SUBCUTANEOUS
  Filled 2017-03-02 (×2): qty 1

## 2017-03-02 MED ORDER — FENTANYL CITRATE (PF) 100 MCG/2ML IJ SOLN
INTRAMUSCULAR | Status: AC
  Filled 2017-03-02: qty 2

## 2017-03-02 MED ORDER — GLYCOPYRROLATE 0.2 MG/ML IJ SOLN
INTRAMUSCULAR | Status: DC | PRN
  Administered 2017-03-02 (×2): 0.2 mg via INTRAVENOUS

## 2017-03-02 MED ORDER — GLYCOPYRROLATE 0.2 MG/ML IJ SOLN
INTRAMUSCULAR | Status: AC
  Filled 2017-03-02: qty 1

## 2017-03-02 MED ORDER — FENTANYL CITRATE (PF) 100 MCG/2ML IJ SOLN
INTRAMUSCULAR | Status: DC | PRN
  Administered 2017-03-02 (×4): 25 ug via INTRAVENOUS

## 2017-03-02 MED ORDER — SUCRALFATE 1 G PO TABS
1.0000 g | ORAL_TABLET | Freq: Two times a day (BID) | ORAL | Status: DC
  Administered 2017-03-02 (×2): 1 g via ORAL
  Filled 2017-03-02 (×2): qty 1

## 2017-03-02 MED ORDER — ONDANSETRON HCL 4 MG/2ML IJ SOLN
INTRAMUSCULAR | Status: AC
  Filled 2017-03-02: qty 2

## 2017-03-02 MED ORDER — DIPHENHYDRAMINE HCL 12.5 MG/5ML PO ELIX
12.5000 mg | ORAL_SOLUTION | Freq: Four times a day (QID) | ORAL | Status: DC | PRN
  Filled 2017-03-02: qty 5

## 2017-03-02 MED ORDER — LACTATED RINGERS IV SOLN
INTRAVENOUS | Status: DC
  Administered 2017-03-02: 10:00:00 via INTRAVENOUS

## 2017-03-02 MED ORDER — PROPOFOL 10 MG/ML IV BOLUS
INTRAVENOUS | Status: AC
  Filled 2017-03-02: qty 20

## 2017-03-02 MED ORDER — FENTANYL CITRATE (PF) 100 MCG/2ML IJ SOLN
INTRAMUSCULAR | Status: AC
  Administered 2017-03-02: 25 ug via INTRAVENOUS
  Filled 2017-03-02: qty 2

## 2017-03-02 SURGICAL SUPPLY — 24 items
ADAPTER IRRIG TUBE 2 SPIKE SOL (ADAPTER) ×6 IMPLANT
BAG DRAIN CYSTO-URO LG1000N (MISCELLANEOUS) ×3 IMPLANT
BAG URO DRAIN 4000ML (MISCELLANEOUS) ×3 IMPLANT
CATH FOL 2WAY LX 24X30 (CATHETERS) IMPLANT
CATH FOL 3WAY LX 20X30 (CATHETERS) ×3 IMPLANT
CATH FOL LEG HOLDER (MISCELLANEOUS) ×3 IMPLANT
DRAPE UTILITY 15X26 TOWEL STRL (DRAPES) ×3 IMPLANT
ELECT LOOP 22F BIPOLAR SML (ELECTROSURGICAL) ×3
ELECTRODE LOOP 22F BIPOLAR SML (ELECTROSURGICAL) ×1 IMPLANT
GLOVE BIO SURGEON STRL SZ 6.5 (GLOVE) ×2 IMPLANT
GLOVE BIO SURGEONS STRL SZ 6.5 (GLOVE) ×1
GOWN STRL REUS W/ TWL LRG LVL3 (GOWN DISPOSABLE) ×2 IMPLANT
GOWN STRL REUS W/TWL LRG LVL3 (GOWN DISPOSABLE) ×4
HOLDER FOLEY CATH W/STRAP (MISCELLANEOUS) ×3 IMPLANT
KIT RM TURNOVER CYSTO AR (KITS) ×3 IMPLANT
LOOP CUT BIPOLAR 24F LRG (ELECTROSURGICAL) IMPLANT
PACK CYSTO AR (MISCELLANEOUS) ×3 IMPLANT
SET IRRIG Y TYPE TUR BLADDER L (SET/KITS/TRAYS/PACK) ×3 IMPLANT
SET IRRIGATING DISP (SET/KITS/TRAYS/PACK) ×3 IMPLANT
SOL .9 NS 3000ML IRR  AL (IV SOLUTION) ×16
SOL .9 NS 3000ML IRR UROMATIC (IV SOLUTION) ×8 IMPLANT
SYR TOOMEY 50ML (SYRINGE) ×3 IMPLANT
SYRINGE IRR TOOMEY STRL 70CC (SYRINGE) ×3 IMPLANT
WATER STERILE IRR 1000ML POUR (IV SOLUTION) ×3 IMPLANT

## 2017-03-02 NOTE — Transfer of Care (Deleted)
Immediate Anesthesia Transfer of Care Note  Patient: Bryan Nelson  Procedure(s) Performed: Procedure(s): TRANSURETHRAL RESECTION OF THE PROSTATE (TURP) (N/A)  Patient Location: PACU  Anesthesia Type:General  Level of Consciousness: drowsy  Airway & Oxygen Therapy: Patient Spontanous Breathing and Patient connected to face mask oxygen  Post-op Assessment: Report given to RN and Post -op Vital signs reviewed and stable  Post vital signs: Reviewed and stable  Last Vitals:  Vitals:   03/02/17 0956  BP: 137/72  Pulse: (!) 56  Resp: 16  Temp: (!) 35.9 C    Last Pain:  Vitals:   03/02/17 0956  TempSrc: Temporal         Complications: No apparent anesthesia complications

## 2017-03-02 NOTE — Anesthesia Post-op Follow-up Note (Cosign Needed)
Anesthesia QCDR form completed.        

## 2017-03-02 NOTE — Op Note (Signed)
Date of procedure: 03/02/17  Preoperative diagnosis:  1. BPH with bladder outlet obstruction   Postoperative diagnosis:  1. Same as above   Procedure: 1. Transurethral resection of the prostate  Surgeon: Hollice Espy, MD  Anesthesia: General  Complications: None  Intraoperative findings: Markedly irregular prostatic fossa.  EBL: minimal  Specimens: Prostate chips  Drains: 71 French three-way Foley catheter on CBI  Indication: Bryan Nelson is a 83 y.o. patient with with a history of BPH, chronic urinary retention and with clean intermittent catheterization with remarkable prostatic regrowth.  After reviewing the management options for treatment, he elected to proceed with the above surgical procedure(s). We have discussed the potential benefits and risks of the procedure, side effects of the proposed treatment, the likelihood of the patient achieving the goals of the procedure, and any potential problems that might occur during the procedure or recuperation. Informed consent has been obtained.  Description of procedure:  The patient was taken to the operating room and general anesthesia was induced.  The patient was placed in the dorsal lithotomy position, prepped and draped in the usual sterile fashion, and preoperative antibiotics were administered. A preoperative time-out was performed.   Male sounds were used to dilate the fossa navicularis. A 26 French resector scope was then introduced per urethra into the bladder. Bladder was inspected and noted to be trabeculated but otherwise unremarkable. The bladder neck was irregular and the left ureteral orifice was quite close to the bladder neck. The site appeared to be mildly undermined with a smooth prostatic fossa without significant regrowth. On the right however, the bladder neck was irregular with nodular regrowth. The entire prostate had nodular-like appearance consistent with prostatic regrowth. The bipolar resectoscope was  then used with saline as a medium to take down the irregular nodularity and smooth out the contour of the prostate. The majority of this was done on the right side, apex, and anterior prostatic wall. Care was taken to avoid any injury to the bladder neck. The procedures were then irrigated out of the bladder. Careful hemostasis is achieved. The bladder neck was then reinspected and the trigone was intact. Bilateral ureteral orifices were identified. After adequate hemostasis was achieved, the scope was removed. A 20 French three-way Foley catheter was placed and the balloon was filled with 30 cc of sterile water. He wasn't started on a slow drip CBI. He was cleaned and dried, repositioned the supine position, reversed from anesthesia, taken to the PACU in stable condition.  Plan: He'll be admitted overnight for CBI.  Hollice Espy, M.D.

## 2017-03-02 NOTE — Anesthesia Preprocedure Evaluation (Signed)
Anesthesia Evaluation  Patient identified by MRN, date of birth, ID band Patient awake    Reviewed: Allergy & Precautions, NPO status , Patient's Chart, lab work & pertinent test results  Airway Mallampati: II       Dental  (+) Partial Lower, Partial Upper   Pulmonary COPD, former smoker,     + decreased breath sounds      Cardiovascular hypertension, Pt. on medications and Pt. on home beta blockers + CAD and + Cardiac Stents   Rhythm:Regular     Neuro/Psych negative psych ROS   GI/Hepatic Neg liver ROS, GERD  Medicated,  Endo/Other  Morbid obesity  Renal/GU negative Renal ROS     Musculoskeletal negative musculoskeletal ROS (+)   Abdominal (+) + obese,   Peds  Hematology   Anesthesia Other Findings   Reproductive/Obstetrics                             Anesthesia Physical Anesthesia Plan  ASA: III  Anesthesia Plan: General   Post-op Pain Management:    Induction: Intravenous  PONV Risk Score and Plan: 0  Airway Management Planned: LMA  Additional Equipment:   Intra-op Plan:   Post-operative Plan: Extubation in OR  Informed Consent: I have reviewed the patients History and Physical, chart, labs and discussed the procedure including the risks, benefits and alternatives for the proposed anesthesia with the patient or authorized representative who has indicated his/her understanding and acceptance.     Plan Discussed with: CRNA  Anesthesia Plan Comments:         Anesthesia Quick Evaluation

## 2017-03-02 NOTE — H&P (View-Only) (Signed)
02/09/2017 2:13 PM   Bryan Nelson 01-11-1932 [id number]  Referring provider: [name], MD [address] [place], [place] [address]   HPI: 83 year old male with history of BPH, chronic urinary retention currently managed with intermittent catheterization who returns today following cystoscopy to discuss possible surgical intervention for his ongoing urinary issues.  Patient underwent laser ablation of his prostate, green light by Dr. Elnoria Howard in 2016. Since then, he has had significant issues including chronic urinary retention managed by intermittent catheterization. His also had recurrent urinary tract infections presumably related to self catheter and self cathetering technique.  More recently, he had issues passing his catheter requiring use of a coud.  He describes occasional issues with meeting resistance, and occasionally sees blood when catheter placement is uncomfortable.  He is currently on maximal medical therapy including Flomax and finasteride. Despite this, he is not voiding.  The patient reports that even before prostate surgery, he is having difficulty emptying his bladder. He was previously managed by Dr. Bernardo Heater at [facility]rology. He's also had urodynamics in the past but unable to generate contraction therefore no pressure flow study was performed.  Most recent CT scan from 03/14/2018shows persistent prostamegaly without significant TURP defect. He also has a thickened bladder wall and a large bladder diverticulum on the left. Calculated prostate volume 69 cc.  PSA  0.81 11/22/16.  Most recently, cystoscopy revealed an irregular prostatic contour with bilateral coaptation, small posterior prostatic fossa false pass, and a large wide mouth bladder diverticulum on the left.  Past medical history is significant for coronary artery disease on aspirin/Plavix. He was able to stop this under the care of Dr. Maryanna Shape 1 month ago for the purpose of port  removal.  PMH: Past Medical History:  Diagnosis Date  . Arteriosclerosis of coronary artery 08/26/2011   Overview:      a.  1999 PCI of the mid LAD with stent.      b.  2002 [facility] [place]: EF 56%. RCA-distal 25/25%. Left main-50% ostial.  Left circumflex-25% OM2.  LAD-25% proximal.  75% mid.  25/25% distal. 75% D1.      c.  07/2011 PCI of RCA with DES.[place]   . Bleeding gastrointestinal 08/26/2011   Overview:      a.  Chronic abdominal pain, present improving.      b.  Duodenitis and gastritis by EGD in 2000.      c.  Pylori in 2000, treated.      d.  Gastroesophageal reflux disease.      e.  Diverticular disease.    . BP (high blood pressure) 08/26/2011  . Cancer ([place]) 2015   Lymphoma  . Heart disease   . Helicobacter pylori infection 06/18/2015   by EGD RX 08/18/1999   . History of adenomatous polyp of colon 06/18/2015    Surgical History: Past Surgical History:  Procedure Laterality Date  . ANGIOPLASTY    . CATARACT EXTRACTION    . COLONOSCOPY  2016  . GREEN LIGHT LASER TURP (TRANSURETHRAL RESECTION OF PROSTATE N/A 04/16/2015   Procedure: GREEN LIGHT LASER TURP (TRANSURETHRAL RESECTION OF PROSTATE WITH BLADDER BIOPSY;  Surgeon: Collier Flowers, MD;  Location: [facility] ORS;  Service: Urology;  Laterality: N/A;  . heart stent placement     1998, 2000, 2012    Home Medications:  Allergies as of 02/09/2017      Reactions   Ciprofloxacin Diarrhea   Lisinopril Other (See Comments)   Penicillins    unknwon  reaction.  tolerates amoxicillin   Sulfa Antibiotics Itching, Rash   Rash      Medication List       Accurate as of 02/09/17  2:13 PM. Always use your most recent med list.          aspirin 81 MG tablet Take 81 mg by mouth daily.   atorvastatin 10 MG tablet Commonly known as:  LIPITOR Take 1 tablet (10 mg total) by mouth daily.   clopidogrel 75 MG tablet Commonly known as:  PLAVIX Take 1 tablet (75 mg total) by mouth daily.   finasteride 5 MG tablet Commonly known as:   PROSCAR Take 1 tablet (5 mg total) by mouth daily.   isosorbide dinitrate 30 MG tablet Commonly known as:  ISORDIL Take 1 tablet (30 mg total) by mouth every morning.   metoprolol tartrate 100 MG tablet Commonly known as:  LOPRESSOR Take 1 tablet (100 mg total) by mouth 2 (two) times daily.   nitroGLYCERIN 0.4 MG SL tablet Commonly known as:  NITROSTAT Place 0.4 mg under the tongue every 5 (five) minutes as needed for chest pain. Reported on 03/10/2016   omeprazole 40 MG capsule Commonly known as:  PRILOSEC Take 1 capsule (40 mg total) by mouth 2 (two) times daily.   ramipril 2.5 MG capsule Commonly known as:  ALTACE Take 1 capsule (2.5 mg total) by mouth daily.   ranitidine 150 MG tablet Commonly known as:  ZANTAC Take 1 tablet (150 mg total) by mouth 2 (two) times daily. Reported on 03/10/2016   sucralfate 1 g tablet Commonly known as:  CARAFATE Take 1 tablet (1 g total) by mouth 2 (two) times daily.   tamsulosin 0.4 MG Caps capsule Commonly known as:  FLOMAX Take 1 capsule (0.4 mg total) by mouth daily.   Vitamin D3 10000 units Tabs Take 1 capsule by mouth daily.       Allergies:  Allergies  Allergen Reactions  . Ciprofloxacin Diarrhea  . Lisinopril Other (See Comments)  . Penicillins     unknwon reaction.  tolerates amoxicillin  . Sulfa Antibiotics Itching and Rash    Rash    Family History: Family History  Problem Relation Age of Onset  . Osteoporosis Mother   . Alzheimer's disease Father   . Kidney disease Neg Hx   . Prostate cancer Neg Hx   . Bladder Cancer Neg Hx   . Kidney cancer Neg Hx     Social History:  reports that he quit smoking about 45 years ago. His smoking use included Cigarettes. He has a 18.00 pack-year smoking history. He has never used smokeless tobacco. He reports that he does not drink alcohol or use drugs.  ROS: UROLOGY Frequent Urination?: No Hard to postpone urination?: No Burning/pain with urination?: No Get up at night  to urinate?: No Leakage of urine?: No Urine stream starts and stops?: No Trouble starting stream?: No Do you have to strain to urinate?: No Blood in urine?: No Urinary tract infection?: No Sexually transmitted disease?: No Injury to kidneys or bladder?: No Painful intercourse?: No Weak stream?: No Erection problems?: No Penile pain?: No  Gastrointestinal Nausea?: No Vomiting?: No Indigestion/heartburn?: No Diarrhea?: No Constipation?: No  Constitutional Fever: No Night sweats?: No Weight loss?: No Fatigue?: No  Skin Skin rash/lesions?: No Itching?: No  Eyes Blurred vision?: No Double vision?: No  Ears/Nose/Throat Sore throat?: No Sinus problems?: No  Hematologic/Lymphatic Swollen glands?: No Easy bruising?: No  Cardiovascular Leg swelling?: No Chest pain?:  No  Respiratory Cough?: No Shortness of breath?: No  Endocrine Excessive thirst?: No  Musculoskeletal Back pain?: No Joint pain?: No  Neurological Headaches?: No Dizziness?: No  Psychologic Depression?: No Anxiety?: No  Physical Exam: BP (!) 148/72 (BP Location: Left Arm, Patient Position: Sitting, Cuff Size: Large)   Pulse (!) 58   Ht 6\' 1"  (1.854 m)   Wt 250 lb (113.4 kg)   BMI 32.98 kg/m   Constitutional:  Alert and oriented, No acute distress.  Accompanied by friend/ family member today.    HEENT: [place] AT, moist mucus membranes.  Trachea midline, no masses. Cardiovascular: No clubbing, cyanosis, or edema. Respiratory: Normal respiratory effort, no increased work of breathing. Skin: No rashes, bruises or suspicious lesions. Neurologic: Grossly intact, no focal deficits, moving all 4 extremities. Psychiatric: Normal mood and affect.  Laboratory Data: Lab Results  Component Value Date   WBC 6.1 01/31/2017   HGB 12.4 (L) 01/31/2017   HCT 37.5 01/31/2017   MCV 87 01/31/2017   PLT 196 01/31/2017    Lab Results  Component Value Date   CREATININE 1.17 01/31/2017    Lab  Results  Component Value Date   PSA 0.81 11/22/2016   PSA 0.9 02/27/2014    Lab Results  Component Value Date   HGBA1C 5.9 11/22/2016    Urinalysis N/a  Pertinent Imaging: No new imaging  Assessment & Plan:   83 year old male with chronic urinary retention managed with clean intermittent catheterization who presents today to discuss his options.   In general, continue to suspect that his voiding dysfunction is multifactorial. He does indeed have an enlarged objective appearing prostate but also has sequela of bladder dysfunction including a large bladder diverticulum which is likely contributing to his retention/inability to empty bladder.  This was confirmed on cystoscopy last month.  Lengthy discussion today about options moving forward. He was offered a redo TURP given cystoscopic findings and ongoing retention. We discussed risk of the procedure at length including bleeding, infection, damage is running structures, stress incontinence, ejaculatory dysfunction, failure of the procedure to resolve or improve retention, stricture, amongst others. All questions were answered. He'll need cardiac clearance to hold his aspirin and Plavix for the procedure. We'll plan on setting up home with a catheter for at least a week to allow his prosthetic urethra to heal and bladder to rest.  Specifically today, we discussed that proceeding with TURP may or may not improve his bladder function or allow him to empty his bladder spontaneously. We discussed the large bladder diverticulum as a contributing factor to his symptoms. He may have some improvement with ability to self catheter by making his prostatic urethra more regular.  1. Urine retention As above  Continue daily clean intermittent catheter using coud tip catheter  2. Recurrent UTI Bacterial colonization related to clean intermittent catheterization Large bladder diverticula which may not be emptying completely with catheterization also  possible contribute in factor We'll continue to treat symptomatically as needed  3. Benign prostatic hyperplasia with urinary obstruction Continue Flomax and finasteride  4. Bladder diverticulum Appreciated on most recent CT scan, likely related to chronic outlet obstruction   Hollice Espy, MD  [place] [address] [place], [place] [address] [phone]  I spent 25 min with this patient of which greater than 50% was spent in counseling and coordination of care with the patient.

## 2017-03-02 NOTE — Interval H&P Note (Signed)
History and Physical Interval Note:  03/02/2017 10:53 AM  Bryan Nelson  has presented today for surgery, with the diagnosis of BPH  The various methods of treatment have been discussed with the patient and family. After consideration of risks, benefits and other options for treatment, the patient has consented to  Procedure(s): TRANSURETHRAL RESECTION OF THE PROSTATE (TURP) (N/A) as a surgical intervention .  The patient's history has been reviewed, patient examined, no change in status, stable for surgery.  I have reviewed the patient's chart and labs.  Questions were answered to the patient's satisfaction.    RRR CTAB  Hollice Espy

## 2017-03-02 NOTE — Transfer of Care (Signed)
Immediate Anesthesia Transfer of Care Note  Patient: Bryan Nelson  Procedure(s) Performed: Procedure(s): TRANSURETHRAL RESECTION OF THE PROSTATE (TURP) (N/A)  Patient Location: PACU  Anesthesia Type:General  Level of Consciousness: drowsy  Airway & Oxygen Therapy: Patient Spontanous Breathing and Patient connected to face mask oxygen  Post-op Assessment: Report given to RN and Post -op Vital signs reviewed and stable  Post vital signs: Reviewed and stable  Last Vitals:  Vitals:   03/02/17 0956 03/02/17 1300  BP: 137/72 (!) 151/83  Pulse: (!) 56 84  Resp: 16 13  Temp: (!) 35.9 C 36.6 C    Last Pain:  Vitals:   03/02/17 1300  TempSrc:   PainSc: 0-No pain         Complications: No apparent anesthesia complications

## 2017-03-02 NOTE — Anesthesia Procedure Notes (Signed)
Procedure Name: LMA Insertion Date/Time: 03/02/2017 11:32 AM Performed by: Jonna Clark Pre-anesthesia Checklist: Patient identified, Patient being monitored, Timeout performed, Emergency Drugs available and Suction available Patient Re-evaluated:Patient Re-evaluated prior to induction Oxygen Delivery Method: Circle system utilized Preoxygenation: Pre-oxygenation with 100% oxygen Induction Type: IV induction Ventilation: Mask ventilation without difficulty LMA: LMA inserted LMA Size: 5.0 Tube type: Oral Number of attempts: 1 Placement Confirmation: positive ETCO2 and breath sounds checked- equal and bilateral Tube secured with: Tape Dental Injury: Teeth and Oropharynx as per pre-operative assessment

## 2017-03-02 NOTE — Anesthesia Postprocedure Evaluation (Signed)
Anesthesia Post Note  Patient: Bryan Nelson  Procedure(s) Performed: Procedure(s) (LRB): TRANSURETHRAL RESECTION OF THE PROSTATE (TURP) (N/A)  Patient location during evaluation: PACU Anesthesia Type: General Level of consciousness: awake Pain management: pain level controlled Vital Signs Assessment: post-procedure vital signs reviewed and stable Respiratory status: nonlabored ventilation Cardiovascular status: stable Anesthetic complications: no     Last Vitals:  Vitals:   03/02/17 1330 03/02/17 1345  BP: 139/82   Pulse: 81   Resp: 14   Temp:  (P) 36.7 C    Last Pain:  Vitals:   03/02/17 1345  TempSrc:   PainSc: (P) 4                  VAN STAVEREN,Deepika [place]

## 2017-03-03 DIAGNOSIS — N401 Enlarged prostate with lower urinary tract symptoms: Secondary | ICD-10-CM | POA: Diagnosis not present

## 2017-03-03 LAB — BASIC METABOLIC PANEL
ANION GAP: 8 (ref 5–15)
BUN: 20 mg/dL (ref 6–20)
CO2: 25 mmol/L (ref 22–32)
Calcium: 8.6 mg/dL — ABNORMAL LOW (ref 8.9–10.3)
Chloride: 107 mmol/L (ref 101–111)
Creatinine, Ser: 1.19 mg/dL (ref 0.61–1.24)
GFR calc non Af Amer: 55 mL/min — ABNORMAL LOW (ref >60)
Glucose, Bld: 125 mg/dL — ABNORMAL HIGH (ref 65–99)
POTASSIUM: 4.3 mmol/L (ref 3.5–5.1)
Sodium: 140 mmol/L (ref 135–145)

## 2017-03-03 LAB — HEMOGLOBIN AND HEMATOCRIT, BLOOD
HEMATOCRIT: 38.3 % — AB (ref 40.0–52.0)
Hemoglobin: 12.9 g/dL — ABNORMAL LOW (ref 13.0–18.0)

## 2017-03-03 MED ORDER — HYDROCODONE-ACETAMINOPHEN 5-325 MG PO TABS: 1.0000 | ORAL_TABLET | Freq: Four times a day (QID) | ORAL | 0 refills | Status: DC | PRN

## 2017-03-03 MED ORDER — DOCUSATE SODIUM 100 MG PO CAPS: 100.0000 mg | ORAL_CAPSULE | Freq: Two times a day (BID) | ORAL | 0 refills | Status: DC

## 2017-03-03 NOTE — Discharge Instructions (Signed)
Indwelling Urinary Catheter Care, Adult Take good care of your catheter to keep it working and to prevent problems. How to wear your catheter Attach your catheter to your leg with tape (adhesive tape) or a leg strap. Make sure it is not too tight. If you use tape, remove any bits of tape that are already on the catheter. How to wear a drainage bag You should have:  A large overnight bag.  A small leg bag.  Overnight Bag You may wear the overnight bag at any time. Always keep the bag below the level of your bladder but off the floor. When you sleep, put a clean plastic bag in a wastebasket. Then hang the bag inside the wastebasket. Leg Bag Never wear the leg bag at night. Always wear the leg bag below your knee. Keep the leg bag secure with a leg strap or tape. How to care for your skin  Clean the skin around the catheter at least once every day.  Shower every day. Do not take baths.  Put creams, lotions, or ointments on your genital area only as told by your doctor.  Do not use powders, sprays, or lotions on your genital area. How to clean your catheter and your skin 1. Wash your hands with soap and water. 2. Wet a washcloth in warm water and gentle (mild) soap. 3. Use the washcloth to clean the skin where the catheter enters your body. Clean downward and wipe away from the catheter in small circles. Do not wipe toward the catheter. 4. Pat the area dry with a clean towel. Make sure to clean off all soap. How to care for your drainage bags Empty your drainage bag when it is ?- full or at least 2-3 times a day. Replace your drainage bag once a month or sooner if it starts to smell bad or look dirty. Do not clean your drainage bag unless told by your doctor. Emptying a drainage bag  Supplies Needed  Rubbing alcohol.  Gauze pad or cotton ball.  Tape or a leg strap.  Steps 1. Wash your hands with soap and water. 2. Separate (detach) the bag from your leg. 3. Hold the bag over  the toilet or a clean container. Keep the bag below your hips and bladder. This stops pee (urine) from going back into the tube. 4. Open the pour spout at the bottom of the bag. 5. Empty the pee into the toilet or container. Do not let the pour spout touch any surface. 6. Put rubbing alcohol on a gauze pad or cotton ball. 7. Use the gauze pad or cotton ball to clean the pour spout. 8. Close the pour spout. 9. Attach the bag to your leg with tape or a leg strap. 10. Wash your hands.  Changing a drainage bag Supplies Needed  Alcohol wipes.  A clean drainage bag.  Adhesive tape or a leg strap.  Steps 1. Wash your hands with soap and water. 2. Separate the dirty bag from your leg. 3. Pinch the rubber catheter with your fingers so that pee does not spill out. 4. Separate the catheter tube from the drainage tube where these tubes connect (at the connection valve). Do not let the tubes touch any surface. 5. Clean the end of the catheter tube with an alcohol wipe. Use a different alcohol wipe to clean the end of the drainage tube. 6. Connect the catheter tube to the drainage tube of the clean bag. 7. Attach the new bag to  the leg with adhesive tape or a leg strap. °8. Wash your hands. ° °How to prevent infection and other problems °· Never pull on your catheter or try to remove it. Pulling can damage tissue in your body. °· Always wash your hands before and after touching your catheter. °· If a leg strap gets wet, replace it with a dry one. °· Drink enough fluids to keep your pee clear or pale yellow, or as told by your doctor. °· Do not let the drainage bag or tubing touch the floor. °· Wear cotton underwear. °· If you are male, wipe from front to back after you poop (have a bowel movement). °· Check on the catheter often to make sure it works and the tubing is not twisted. °Get help if: °· Your pee is cloudy. °· Your pee smells unusually bad. °· Your pee is not draining into the bag. °· Your  tube gets clogged. °· Your catheter starts to leak. °· Your bladder feels full. °Get help right away if: °· You have redness, swelling, or pain where the catheter enters your body. °· You have fluid, pus, or a bad smell coming from the area where the catheter enters your body. °· The area where the catheter enters your body feels warm. °· You have a fever. °· You have pain in your: °? Stomach (abdomen). °? Legs. °? Lower back. °? Bladder. °· You see blood fill the catheter. °· Your pee is pink or red. °· You feel sick to your stomach (nauseous). °· You throw up (vomit). °· You have chills. °· Your catheter gets pulled out. °This information is not intended to replace advice given to you by your health care provider. Make sure you discuss any questions you have with your health care provider. °Document Released: 12/04/2012 Document Revised: 07/07/2016 Document Reviewed: 01/22/2014 °Elsevier Interactive Patient Education © 2018 [employer] ° ° °Transurethral Resection of the Prostate, Care After °Refer to this sheet in the next few weeks. These instructions provide you with information about caring for yourself after your procedure. Your health care provider may also give you more specific instructions. Your treatment has been planned according to current medical practices, but problems sometimes occur. Call your health care provider if you have any problems or questions after your procedure. °What can I expect after the procedure? °After the procedure, it is common to have: °· Mild pain in your lower abdomen. °· Soreness or mild discomfort in your penis from having the catheter inserted during the procedure. °· A feeling of urgency when you need to urinate. °· A small amount of blood in your urine. You may notice some small blood clots in your urine. These are normal. ° °Follow these instructions at home: °Medicines ° °· Take over-the-counter and prescription medicines only as told by your health care  provider. °· Do not drive or operate heavy machinery while taking prescription pain medicine. °· Do not drive for 24 hours if you received a sedative. °· If you were prescribed antibiotic medicine, take it as told by your health care provider. Do not stop taking the antibiotic even if you start to feel better. °Activity °· Return to your normal activities as told by your health care provider. Ask your health care provider what activities are safe for you. °· Do not lift anything that is heavier than 10 lb (4.5 kg) for 3 weeks after your procedure, or as long as told by your health care provider. °· Avoid intense physical activity   long as told by your health care provider.  Walk at least one time every day. This helps to prevent blood clots. You may increase your physical activity gradually as you start to feel better. Lifestyle  Do not drink alcohol for as long as told by your health care provider. This is especially important if you are taking prescription pain medicines.  Do not engage in sexual activity until your health care provider says that you can do this. General instructions  Do not take baths, swim, or use a hot tub until your health care provider approves.  Drink enough fluid to keep your urine clear or pale yellow.  Urinate as soon as you feel the need to. Do not try to hold your urine for long periods of time.  If your health care provider approves, you may take a stool softener for 2-3 weeks to prevent you from straining to have a bowel movement.  Wear compression stockings as told by your health care provider. These stockings help to prevent blood clots and reduce swelling in your legs.  Keep all follow-up visits as told by your health care provider. This is important. Contact a health care provider if:  You have difficulty urinating.  You have a fever.  You have pain that gets worse or does not improve with medicine.  You have blood in your urine that does not go away after 1  week of resting and drinking more fluids.  You have swelling in your penis or testicles. Get help right away if:  You are unable to urinate.  You are having more blood clots in your urine instead of fewer.  You have: ? Large blood clots. ? A lot of blood in your urine. ? Pain in your back or lower abdomen. ? Pain or swelling in your legs. ? Chills and you are shaking. This information is not intended to replace advice given to you by your health care provider. Make sure you discuss any questions you have with your health care provider. Document Released: 08/09/2005 Document Revised: 04/11/2016 Document Reviewed: 05/01/2015 Elsevier Interactive Patient Education  2017 [employer].

## 2017-03-03 NOTE — Progress Notes (Signed)
Discharge order received. Patient is alert and oriented. Vital signs stable . No signs of acute distress. Leg bag applied. Discharge instructions given. Patient verbalized understanding with discharge instructions and foley/leg bag  catheter care. No other issues noted at this time. Patient opted to pick up prescription for pain medicine at Dr.Brandon's office.

## 2017-03-03 NOTE — Discharge Summary (Signed)
Date of admission: 03/02/2017  Date of discharge: 03/03/2017  Admission diagnosis: BPH with bladder outlet obstruction, chronic urinary retention, bladder diverticulum  Discharge diagnosis: Same as above  Secondary diagnoses:  Patient Active Problem List   Diagnosis Date Noted  . BPH (benign prostatic hyperplasia) 03/02/2017  . Malleolar fracture (Right) 09/10/2016  . Closed dislocation of right ankle 09/10/2016  . Recurrent UTI 12/21/2015  . Urinary retention 09/24/2015  . Narrowing of intervertebral disc space 06/18/2015  . Diverticulitis 06/18/2015  . Esophageal reflux 06/18/2015  . Familial multiple lipoprotein-type hyperlipidemia 06/18/2015  . Insomnia 06/18/2015  . Vitamin D deficiency 06/18/2015  . DLBCL (diffuse large B cell lymphoma) ([place]) 04/10/2014  . Benign prostatic hyperplasia with urinary obstruction 08/03/2012  . ED (erectile dysfunction) of organic origin 08/03/2012  . Incomplete bladder emptying 08/03/2012  . Breath shortness 12/09/2011  . Benign prostatic hypertrophy without urinary obstruction 08/26/2011  . Arteriosclerosis of coronary artery 08/26/2011  . Bleeding gastrointestinal 08/26/2011  . HLD (hyperlipidemia) 08/26/2011  . BP (high blood pressure) 08/26/2011  . Disorder of peripheral nervous system 08/26/2011  . Enlarged prostate 08/26/2011  . Peripheral nerve disease 08/26/2011  . Current tobacco use 08/26/2011    History and Physical: For full details, please see admission history and physical. Briefly, Bryan Nelson is a 83 y.o. year old patient with Chronic urinary retention secondary to bladder outlet obstruction who presents today for TURP.  He was admitted overnight for observation and continuous bladder irrigation.  Hospital Course: Patient tolerated the procedure well.  He was then transferred to the floor after an uneventful PACU stay.  His hospital course was uncomplicated.  On POD#1 he had met discharge criteria: was eating a regular diet,  was up and ambulating independently,  pain was well controlled, and was ready to for discharge.  His CBI was stopped on the morning of discharge and his urine remained light P ink. His Foley was capped (3rd port) and he was discharged home with a Foley catheter in place.  Physical Exam  Constitutional: He is oriented to person, place, and time. He appears well-developed and well-nourished.  HENT:  Head: Normocephalic and atraumatic.  Neck: Normal range of motion. Neck supple.  Pulmonary/Chest: Effort normal. No respiratory distress.  Abdominal: Soft. He exhibits no distension.  Genitourinary: Penis normal.  Genitourinary Comments: Three-way Foley in place, CBI off with the light pink urine.  Neurological: He is alert and oriented to person, place, and time.  Skin: Skin is warm and dry.  Psychiatric: He has a normal mood and affect.  Vitals reviewed.    Laboratory values:   Recent Labs  03/03/17 0437  HGB 12.9*  HCT 38.3*    Recent Labs  03/03/17 0437  NA 140  K 4.3  CL 107  CO2 25  GLUCOSE 125*  BUN 20  CREATININE 1.19  CALCIUM 8.6*   No results for input(s): LABPT, INR in the last 72 hours. No results for input(s): LABURIN in the last 72 hours. Results for orders placed or performed during the hospital encounter of 02/18/17  Urine culture     Status: None   Collection Time: 02/18/17 11:57 AM  Result Value Ref Range Status   Specimen Description URINE, CLEAN CATCH  Final   Special Requests NONE  Final   Culture   Final    NO GROWTH Performed at [facility], [place] [address], [place], [place] [address]    Report Status 02/19/2017 FINAL  Final  Disposition: Home  Discharge instruction: Routine Foley care  Resume ASA once urine is clear.    Discharge medications:  Allergies as of 03/03/2017      Reactions   Ciprofloxacin Diarrhea   Lisinopril Other (See Comments)   Unknown    Penicillins Swelling   unknwon reaction.  tolerates amoxicillin Has  patient had a PCN reaction causing immediate rash, facial/tongue/throat swelling, SOB or lightheadedness with hypotension: No Has patient had a PCN reaction causing severe rash involving mucus membranes or skin necrosis: No Has patient had a PCN reaction that required hospitalization: No Has patient had a PCN reaction occurring within the last 10 years: Yes If all of the above answers are "NO", then may proceed with Cephalosporin use.   Sulfa Antibiotics Itching, Rash      Medication List    TAKE these medications   aspirin 81 MG tablet Take 81 mg by mouth daily.   atorvastatin 10 MG tablet Commonly known as:  LIPITOR Take 1 tablet (10 mg total) by mouth daily.   clopidogrel 75 MG tablet Commonly known as:  PLAVIX Take 1 tablet (75 mg total) by mouth daily.   docusate sodium 100 MG capsule Commonly known as:  COLACE Take 1 capsule (100 mg total) by mouth 2 (two) times daily.   finasteride 5 MG tablet Commonly known as:  PROSCAR Take 1 tablet (5 mg total) by mouth daily.   HYDROcodone-acetaminophen 5-325 MG tablet Commonly known as:  NORCO/VICODIN Take 1-2 tablets by mouth every 6 (six) hours as needed for moderate pain.   isosorbide dinitrate 30 MG tablet Commonly known as:  ISORDIL Take 1 tablet (30 mg total) by mouth every morning.   metoprolol tartrate 100 MG tablet Commonly known as:  LOPRESSOR Take 1 tablet (100 mg total) by mouth 2 (two) times daily. What changed:  how much to take   nitroGLYCERIN 0.4 MG SL tablet Commonly known as:  NITROSTAT Place 0.4 mg under the tongue every 5 (five) minutes as needed for chest pain. Reported on 03/10/2016   omeprazole 40 MG capsule Commonly known as:  PRILOSEC Take 1 capsule (40 mg total) by mouth 2 (two) times daily.   ramipril 2.5 MG capsule Commonly known as:  ALTACE Take 1 capsule (2.5 mg total) by mouth daily.   ranitidine 150 MG tablet Commonly known as:  ZANTAC Take 1 tablet (150 mg total) by mouth 2 (two)  times daily. Reported on 03/10/2016   sucralfate 1 g tablet Commonly known as:  CARAFATE Take 1 tablet (1 g total) by mouth 2 (two) times daily.   tamsulosin 0.4 MG Caps capsule Commonly known as:  FLOMAX Take 1 capsule (0.4 mg total) by mouth daily.   Vitamin D3 1000 units Caps Take 1,000 Units by mouth daily.       Followup:  Follow-up Information    Hollice Espy, MD In 1 month.   Specialty:  Urology Why:  AND nurse visit voiding trial in 1 week Contact information: [address] [place] [id number] [phone]

## 2017-03-03 NOTE — Care Management Obs Status (Signed)
[facility] NOTIFICATION   Patient Details  Name: LEMOINE GOYNE MRN: [id number] Date of Birth: 01-30-1932   Medicare Observation Status Notification Given:  Yes    Jolly Mango, RN 03/03/2017, 10:43 AM

## 2017-03-04 LAB — SURGICAL PATHOLOGY

## 2017-03-06 ENCOUNTER — Encounter: Payer: Self-pay | Admitting: Emergency Medicine

## 2017-03-06 ENCOUNTER — Emergency Department
Admission: EM | Admit: 2017-03-06 | Discharge: 2017-03-06 | Disposition: A | Discharge status: Home / Self Care | Payer: PPO | Attending: Emergency Medicine | Admitting: Emergency Medicine

## 2017-03-06 DIAGNOSIS — Z7982 Long term (current) use of aspirin: Secondary | ICD-10-CM | POA: Diagnosis not present

## 2017-03-06 DIAGNOSIS — Z87891 Personal history of nicotine dependence: Secondary | ICD-10-CM | POA: Insufficient documentation

## 2017-03-06 DIAGNOSIS — Z466 Encounter for fitting and adjustment of urinary device: Secondary | ICD-10-CM | POA: Insufficient documentation

## 2017-03-06 DIAGNOSIS — Z7902 Long term (current) use of antithrombotics/antiplatelets: Secondary | ICD-10-CM | POA: Insufficient documentation

## 2017-03-06 DIAGNOSIS — T83098A Other mechanical complication of other indwelling urethral catheter, initial encounter: Secondary | ICD-10-CM | POA: Diagnosis not present

## 2017-03-06 DIAGNOSIS — R339 Retention of urine, unspecified: Secondary | ICD-10-CM | POA: Diagnosis present

## 2017-03-06 DIAGNOSIS — Y732 Prosthetic and other implants, materials and accessory gastroenterology and urology devices associated with adverse incidents: Secondary | ICD-10-CM | POA: Diagnosis not present

## 2017-03-06 DIAGNOSIS — Z79899 Other long term (current) drug therapy: Secondary | ICD-10-CM | POA: Diagnosis not present

## 2017-03-06 NOTE — ED Notes (Addendum)
Bladder scan = 343. This RN went to possibly irrigate foley catheter and noticed at that the catheter tubing was tied so tight as to not allow drainage. Catheter securement device relaxed on tubing and urine began to flow. Pt has filled and emptied his bag at this point.

## 2017-03-06 NOTE — ED Triage Notes (Signed)
Pt states that he had surgery on his prostate Wednesday and has a leg bag on his foley catheter. Pt is ambulatory at this time in triage with c/o urinary retention due to possible blood clot in catheter. Pt is in NAD at this time.

## 2017-03-07 NOTE — ED Provider Notes (Signed)
[facility] Emergency Department Provider Note  ____________________________________________  Time seen: Approximately 7:21 PM  I have reviewed the triage vital signs and the nursing notes.   HISTORY  Chief Complaint Post-op Problem    HPI Bryan Nelson is a 83 y.o. male presenting to the emergency department with urinary retention for one day after patient had transurethral resection of the prostate. Patient denies associated chest pain, chest tightness, shortness of breath, nausea, vomiting abdominal pain.   Past Medical History:  Diagnosis Date  . Arteriosclerosis of coronary artery 08/26/2011   Overview:      a.  1999 PCI of the mid LAD with stent.      b.  2002 Cath [place]: EF 56%. RCA-distal 25/25%. Left main-50% ostial.  Left circumflex-25% OM2.  LAD-25% proximal.  75% mid.  25/25% distal. 75% D1.      c.  07/2011 PCI of RCA with DES.[place]   . Bleeding gastrointestinal 08/26/2011   Overview:      a.  Chronic abdominal pain, present improving.      b.  Duodenitis and gastritis by EGD in 2000.      c.  Pylori in 2000, treated.      d.  Gastroesophageal reflux disease.      e.  Diverticular disease.    . BP (high blood pressure) 08/26/2011  . Cancer ([place]) 2015   Lymphoma  . GERD (gastroesophageal reflux disease)   . Heart disease   . Helicobacter pylori infection 06/18/2015   by EGD RX 08/18/1999   . History of adenomatous polyp of colon 06/18/2015  . HOH (hard of hearing)    Bilateral Hearing  Aids    Patient Active Problem List   Diagnosis Date Noted  . BPH (benign prostatic hyperplasia) 03/02/2017  . Malleolar fracture (Right) 09/10/2016  . Closed dislocation of right ankle 09/10/2016  . Recurrent UTI 12/21/2015  . Urinary retention 09/24/2015  . Narrowing of intervertebral disc space 06/18/2015  . Diverticulitis 06/18/2015  . Esophageal reflux 06/18/2015  . Familial multiple lipoprotein-type hyperlipidemia 06/18/2015  . Insomnia 06/18/2015   . Vitamin D deficiency 06/18/2015  . DLBCL (diffuse large B cell lymphoma) ([place]) 04/10/2014  . Benign prostatic hyperplasia with urinary obstruction 08/03/2012  . ED (erectile dysfunction) of organic origin 08/03/2012  . Incomplete bladder emptying 08/03/2012  . Breath shortness 12/09/2011  . Benign prostatic hypertrophy without urinary obstruction 08/26/2011  . Arteriosclerosis of coronary artery 08/26/2011  . Bleeding gastrointestinal 08/26/2011  . HLD (hyperlipidemia) 08/26/2011  . BP (high blood pressure) 08/26/2011  . Disorder of peripheral nervous system 08/26/2011  . Enlarged prostate 08/26/2011  . Peripheral nerve disease 08/26/2011  . Current tobacco use 08/26/2011    Past Surgical History:  Procedure Laterality Date  . ANGIOPLASTY    . CATARACT EXTRACTION    . COLONOSCOPY  2016  . CORONARY ANGIOPLASTY    . EYE SURGERY Bilateral    Cataract Extraction with IOL  . GREEN LIGHT LASER TURP (TRANSURETHRAL RESECTION OF PROSTATE N/A 04/16/2015   Procedure: GREEN LIGHT LASER TURP (TRANSURETHRAL RESECTION OF PROSTATE WITH BLADDER BIOPSY;  Surgeon: Collier Flowers, MD;  Location: [facility] ORS;  Service: Urology;  Laterality: N/A;  . heart stent placement     1998, 2000, 2012  . TRANSURETHRAL RESECTION OF PROSTATE N/A 03/02/2017   Procedure: TRANSURETHRAL RESECTION OF THE PROSTATE (TURP);  Surgeon: Hollice Espy, MD;  Location: [facility] ORS;  Service: Urology;  Laterality: N/A;    Prior to Admission medications  Medication Sig Start Date End Date Taking? Authorizing Provider  aspirin 81 MG tablet Take 81 mg by mouth daily.    [provider]  atorvastatin (LIPITOR) 10 MG tablet Take 1 tablet (10 mg total) by mouth daily. 08/26/16   [name], MD  Cholecalciferol (VITAMIN D3) 1000 units CAPS Take 1,000 Units by mouth daily.    [provider]  clopidogrel (PLAVIX) 75 MG tablet Take 1 tablet (75 mg total) by mouth daily. 08/26/16   [name], MD  docusate  sodium (COLACE) 100 MG capsule Take 1 capsule (100 mg total) by mouth 2 (two) times daily. 03/03/17   Hollice Espy, MD  finasteride (PROSCAR) 5 MG tablet Take 1 tablet (5 mg total) by mouth daily. 11/18/16   Zara Council A, PA-C  HYDROcodone-acetaminophen (NORCO/VICODIN) 5-325 MG tablet Take 1-2 tablets by mouth every 6 (six) hours as needed for moderate pain. 03/03/17   Hollice Espy, MD  isosorbide dinitrate (ISORDIL) 30 MG tablet Take 1 tablet (30 mg total) by mouth every morning. 08/26/16   [name], MD  metoprolol (LOPRESSOR) 100 MG tablet Take 1 tablet (100 mg total) by mouth 2 (two) times daily. Patient taking differently: Take 50 mg by mouth 2 (two) times daily.  08/26/16   [name], MD  nitroGLYCERIN (NITROSTAT) 0.4 MG SL tablet Place 0.4 mg under the tongue every 5 (five) minutes as needed for chest pain. Reported on 03/10/2016    [provider]  omeprazole (PRILOSEC) 40 MG capsule Take 1 capsule (40 mg total) by mouth 2 (two) times daily. 08/26/16   [name], MD  ramipril (ALTACE) 2.5 MG capsule Take 1 capsule (2.5 mg total) by mouth daily. 09/22/16   [name], MD  ranitidine (ZANTAC) 150 MG tablet Take 1 tablet (150 mg total) by mouth 2 (two) times daily. Reported on 03/10/2016 08/26/16   [name], MD  sucralfate (CARAFATE) 1 g tablet Take 1 tablet (1 g total) by mouth 2 (two) times daily. 12/31/16   [name], MD  tamsulosin (FLOMAX) 0.4 MG CAPS capsule Take 1 capsule (0.4 mg total) by mouth daily. 11/18/16   Zara Council A, PA-C    Allergies Ciprofloxacin; Lisinopril; Penicillins; and Sulfa antibiotics  Family History  Problem Relation Age of Onset  . Osteoporosis Mother   . Alzheimer's disease Father   . Kidney disease Neg Hx   . Prostate cancer Neg Hx   . Bladder Cancer Neg Hx   . Kidney cancer Neg Hx     Social History Social History  Substance Use Topics  . Smoking status: Former Smoker    Packs/day: 1.50     Years: 12.00    Types: Cigarettes    Quit date: 08/24/1971  . Smokeless tobacco: Never Used  . Alcohol use No     Review of Systems  Constitutional: No fever/chills Eyes: No visual changes. No discharge ENT: No upper respiratory complaints. Cardiovascular: no chest pain. Respiratory: no cough. No SOB. Gastrointestinal: No abdominal pain.  No nausea, no vomiting.  No diarrhea.  No constipation. Genitourinary: Patient has urinary retention Musculoskeletal: Negative for musculoskeletal pain. Skin: Negative for rash, abrasions, lacerations, ecchymosis. Neurological: Negative for headaches, focal weakness or numbness.   ____________________________________________   PHYSICAL EXAM:  VITAL SIGNS: ED Triage Vitals [03/06/17 2143]  Enc Vitals Group     BP (!) 175/86     Pulse Rate 68     Resp 18  Temp 98.2 F (36.8 C)     Temp Source Oral     SpO2 99 %     Weight 250 lb (113.4 kg)     Height 6\' 1"  (1.854 m)     Head Circumference      Peak Flow      Pain Score 6     Pain Loc      Pain Edu?      Excl. in [place]?      Constitutional: Alert and oriented. Well appearing and in no acute distress. Eyes: Conjunctivae are normal. PERRL. EOMI. Head: Atraumatic.  Cardiovascular: Normal rate, regular rhythm. Normal S1 and S2.  Good peripheral circulation. Respiratory: Normal respiratory effort without tachypnea or retractions. Lungs CTAB. Good air entry to the bases with no decreased or absent breath sounds. Gastrointestinal: Bowel sounds 4 quadrants. Soft and nontender to palpation. No guarding or rigidity. No palpable masses. No distention. No CVA tenderness. Musculoskeletal: Full range of motion to all extremities. No gross deformities appreciated. Neurologic:  Normal speech and language. No gross focal neurologic deficits are appreciated.  Skin:  Skin is warm, dry and intact. No rash noted. Psychiatric: Mood and affect are normal. Speech and behavior are normal. Patient exhibits  appropriate insight and judgement.   ____________________________________________   LABS (all labs ordered are listed, but only abnormal results are displayed)  Labs Reviewed - No data to display ____________________________________________  EKG   ____________________________________________  RADIOLOGY   No results found.  ____________________________________________    PROCEDURES  Procedure(s) performed:    Procedures    Medications - No data to display   ____________________________________________   INITIAL IMPRESSION / ASSESSMENT AND PLAN / ED COURSE  Pertinent labs & imaging results that were available during my care of the patient were reviewed by me and considered in my medical decision making (see chart for details).  Review of the [place] CSRS was performed in accordance of the [place] prior to dispensing any controlled drugs.    Assessment and plan: Catheter change Patient had an obstruction in his catheter. After catheter securement was relaxed on tubing, urine flowed freely. Patient was advised to follow-up with Dr. Erlene Quan as needed. Vital signs were reassuring prior to discharge. All patient questions were answered.    ____________________________________________  FINAL CLINICAL IMPRESSION(S) / ED DIAGNOSES  Final diagnoses:  Catheter (urine) change required      NEW MEDICATIONS STARTED DURING THIS VISIT:  Discharge Medication List as of 03/06/2017 11:02 PM          This chart was dictated using voice recognition software/Dragon. Despite best efforts to proofread, errors can occur which can change the meaning. Any change was purely unintentional.    Lannie Fields, PA-C 03/07/17 1930    Nance Pear, MD 03/07/17 [phone]

## 2017-03-10 ENCOUNTER — Ambulatory Visit: Payer: PPO

## 2017-03-10 VITALS — BP 93/55 | HR 74 | Ht 73.0 in | Wt 250.0 lb

## 2017-03-10 DIAGNOSIS — N390 Urinary tract infection, site not specified: Secondary | ICD-10-CM

## 2017-03-10 NOTE — Progress Notes (Signed)
Fill and Pull Catheter Removal  Patient is present today for a catheter removal.  Patient was cleaned and prepped in a sterile fashion 229ml of sterile water/ saline was instilled into the bladder when the patient felt the urge to urinate. 38ml of water was then drained from the balloon.  A 20FR foley cath was removed from the bladder no complications were noted .  Patient as then given some time to void on their own.  Patient can void  2ml on their own after some time.  Patient tolerated well. Pt instructed to drink plenty of fluids, and to return to office at 3pm for bladder scan due to inability to void over 1/2 of sterile water instilled. Patient verbalized understanding.  Preformed by: C. Corinna Capra, CMA    Follow up/ Additional notes: F/u with Dr. Erlene Quan on 08/15.

## 2017-03-15 ENCOUNTER — Telehealth: Payer: Self-pay | Admitting: Urology

## 2017-03-15 ENCOUNTER — Ambulatory Visit: Payer: PPO

## 2017-03-15 VITALS — BP 127/74 | HR 74 | Ht 73.0 in | Wt 250.0 lb

## 2017-03-15 DIAGNOSIS — R300 Dysuria: Secondary | ICD-10-CM | POA: Diagnosis not present

## 2017-03-15 LAB — URINALYSIS, COMPLETE
BILIRUBIN UA: NEGATIVE
Glucose, UA: NEGATIVE
KETONES UA: NEGATIVE
NITRITE UA: POSITIVE — AB
PH UA: 6 (ref 5.0–7.5)
SPEC GRAV UA: 1.025 (ref 1.005–1.030)
Urobilinogen, Ur: 0.2 mg/dL (ref 0.2–1.0)

## 2017-03-15 LAB — MICROSCOPIC EXAMINATION

## 2017-03-15 NOTE — Telephone Encounter (Signed)
Pt called office sounds in distress, stating that he has very bad burning all the time not just when he urinates, unable to urinate much on his own, has to self cath to get relief. Denies fever/chills. Added to nurse schedule this morning.

## 2017-03-15 NOTE — Telephone Encounter (Signed)
See nurse visit for today

## 2017-03-15 NOTE — Progress Notes (Signed)
Patient presents today with c/o UTI sx(s) x3 days:   Burning/painful urination  Patient has not been on antibiotics in the last 30 days. Patient is not on suppressive antibiotics. Patient has had urological surgery in the last 30 days.  Vitals:   03/15/17 1054  BP: 127/74  Pulse: 74  Weight: 250 lb (113.4 kg)  Height: 6\' 1"  (1.854 m)   Pt provided urine sample, advised pt on u/a and ucx turn-around times, and informed will be contacted when results reviewed by provider. Pt verbalized understanding.

## 2017-03-18 ENCOUNTER — Other Ambulatory Visit: Payer: Self-pay | Admitting: Urology

## 2017-03-18 ENCOUNTER — Telehealth: Payer: Self-pay

## 2017-03-18 LAB — URINE CULTURE

## 2017-03-18 MED ORDER — CIPROFLOXACIN HCL 500 MG PO TABS: 500.0000 mg | ORAL_TABLET | Freq: Two times a day (BID) | ORAL | 0 refills | Status: DC

## 2017-03-18 NOTE — Telephone Encounter (Signed)
Patient notified that urine culture is positive a script for Cipro was sent to his pharm. Cipro noted reaction in chart is diarrhea which is a side effect not a allergy. Patient was told to get an OTC probiotic to help with the diarrhea. Patient verbalized understanding and is in agreement with this plan

## 2017-03-21 ENCOUNTER — Telehealth: Payer: Self-pay

## 2017-03-21 NOTE — Telephone Encounter (Signed)
-----   Message from Nori Riis, PA-C sent at 03/18/2017  4:51 PM EDT ----- Please let Mr. Vogelsang know that his urine culture was positive for infection.  Dr. Erlene Quan has sent in Cipro.

## 2017-03-21 NOTE — Telephone Encounter (Signed)
Spoke with pt in reference to ucx and cipro. Pt stated that he picked abx up Friday.

## 2017-03-23 ENCOUNTER — Ambulatory Visit
Admission: RE | Admit: 2017-03-23 | Discharge: 2017-03-23 | Disposition: A | Discharge status: Home / Self Care | Payer: PPO | Source: Ambulatory Visit | Attending: Family Medicine | Admitting: Family Medicine

## 2017-03-23 DIAGNOSIS — R42 Dizziness and giddiness: Secondary | ICD-10-CM | POA: Diagnosis not present

## 2017-03-23 MED ORDER — IOPAMIDOL (ISOVUE-300) INJECTION 61%
75.0000 mL | Freq: Once | INTRAVENOUS | Status: AC | PRN
  Administered 2017-03-23: 75 mL via INTRAVENOUS

## 2017-03-25 ENCOUNTER — Ambulatory Visit (INDEPENDENT_AMBULATORY_CARE_PROVIDER_SITE_OTHER): Payer: PPO | Admitting: Family Medicine

## 2017-03-25 ENCOUNTER — Encounter: Payer: Self-pay | Admitting: Family Medicine

## 2017-03-25 DIAGNOSIS — R0989 Other specified symptoms and signs involving the circulatory and respiratory systems: Secondary | ICD-10-CM | POA: Insufficient documentation

## 2017-03-25 MED ORDER — METOPROLOL SUCCINATE ER 50 MG PO TB24: ORAL_TABLET | ORAL | 1 refills | Status: DC

## 2017-03-25 NOTE — Progress Notes (Signed)
Patient: Bryan Nelson Male    DOB: 02/22/1932   83 y.o.   MRN: [id number] Visit Date: 03/25/2017  Today's Provider: Lelon Huh, MD   Chief Complaint  Patient presents with  . Hypotension   Subjective:    Patient is here to discuss his blood pressure, which has been running low at times. Patient stated that on 03/18/2017 his bp was 77/3. He became flushed, dizzy and lightheaded. He felt better after about an hour,but still light headed. He stopped metoprolol for about 5 days, and restarted at 1/2 BID a few days ago.  Patient's bp resding this morning was 137/71. He feels much better now, but still gets a little light headed when standing. He is followed by Dr. Clayborn Bigness and had echo about a month ago remarkable only for LVH.       Allergies  Allergen Reactions  . Ciprofloxacin Diarrhea  . Lisinopril Other (See Comments)    Unknown   . Penicillins Swelling    unknwon reaction.  tolerates amoxicillin Has patient had a PCN reaction causing immediate rash, facial/tongue/throat swelling, SOB or lightheadedness with hypotension: No Has patient had a PCN reaction causing severe rash involving mucus membranes or skin necrosis: No Has patient had a PCN reaction that required hospitalization: No Has patient had a PCN reaction occurring within the last 10 years: Yes If all of the above answers are "NO", then may proceed with Cephalosporin use.   . Sulfa Antibiotics Itching and Rash     Current Outpatient Prescriptions:  .  aspirin 81 MG tablet, Take 81 mg by mouth daily., Disp: , Rfl:  .  atorvastatin (LIPITOR) 10 MG tablet, Take 1 tablet (10 mg total) by mouth daily., Disp: 90 tablet, Rfl: 4 .  Cholecalciferol (VITAMIN D3) 1000 units CAPS, Take 1,000 Units by mouth daily., Disp: , Rfl:  .  ciprofloxacin (CIPRO) 500 MG tablet, Take 1 tablet (500 mg total) by mouth every 12 (twelve) hours., Disp: 14 tablet, Rfl: 0 .  clopidogrel (PLAVIX) 75 MG tablet, Take 1 tablet (75 mg total)  by mouth daily., Disp: 90 tablet, Rfl: 4 .  docusate sodium (COLACE) 100 MG capsule, Take 1 capsule (100 mg total) by mouth 2 (two) times daily., Disp: 60 capsule, Rfl: 0 .  finasteride (PROSCAR) 5 MG tablet, Take 1 tablet (5 mg total) by mouth daily., Disp: 90 tablet, Rfl: 3 .  HYDROcodone-acetaminophen (NORCO/VICODIN) 5-325 MG tablet, Take 1-2 tablets by mouth every 6 (six) hours as needed for moderate pain., Disp: 10 tablet, Rfl: 0 .  isosorbide dinitrate (ISORDIL) 30 MG tablet, Take 1 tablet (30 mg total) by mouth every morning., Disp: 90 tablet, Rfl: 4 .  metoprolol (LOPRESSOR) 100 MG tablet, Take 1 tablet (100 mg total) by mouth 2 (two) times daily. (Patient taking differently: Take 50 mg by mouth 2 (two) times daily. Patient is taking 1/2 tablet qd), Disp: 180 tablet, Rfl: 4 .  nitroGLYCERIN (NITROSTAT) 0.4 MG SL tablet, Place 0.4 mg under the tongue every 5 (five) minutes as needed for chest pain. Reported on 03/10/2016, Disp: , Rfl:  .  omeprazole (PRILOSEC) 40 MG capsule, Take 1 capsule (40 mg total) by mouth 2 (two) times daily., Disp: 180 capsule, Rfl: 4 .  ramipril (ALTACE) 2.5 MG capsule, Take 1 capsule (2.5 mg total) by mouth daily., Disp: 90 capsule, Rfl: 4 .  ranitidine (ZANTAC) 150 MG tablet, Take 1 tablet (150 mg total) by mouth 2 (two) times daily. Reported  on 03/10/2016, Disp: 180 tablet, Rfl: 4 .  sucralfate (CARAFATE) 1 g tablet, Take 1 tablet (1 g total) by mouth 2 (two) times daily., Disp: 60 tablet, Rfl: 4 .  tamsulosin (FLOMAX) 0.4 MG CAPS capsule, Take 1 capsule (0.4 mg total) by mouth daily., Disp: 90 capsule, Rfl: 3  Current Facility-Administered Medications:  .  lidocaine (XYLOCAINE) 2 % jelly 1 application, 1 application, Urethral, Once, Collier Flowers, MD  Review of Systems  Constitutional: Negative for appetite change, chills and fever.  Respiratory: Negative for chest tightness, shortness of breath and wheezing.   Cardiovascular: Negative for chest pain and  palpitations.  Gastrointestinal: Negative for abdominal pain, nausea and vomiting.    Social History  Substance Use Topics  . Smoking status: Former Smoker    Packs/day: 1.50    Years: 12.00    Types: Cigarettes    Quit date: 08/24/1971  . Smokeless tobacco: Never Used  . Alcohol use No   Objective:   BP 110/60 (BP Location: Right Arm, Patient Position: Sitting, Cuff Size: Large)   Pulse 80   Temp 98.2 F (36.8 C) (Oral)   Resp 16   Wt 256 lb (116.1 kg)   SpO2 96%   BMI 33.78 kg/m  Vitals:   03/25/17 1355  BP: 110/60  Pulse: 80  Resp: 16  Temp: 98.2 F (36.8 C)  TempSrc: Oral  SpO2: 96%  Weight: 256 lb (116.1 kg)     Physical Exam   General Appearance:    Alert, cooperative, no distress  Eyes:    PERRL, conjunctiva/corneas clear, EOM's intact       Lungs:     Clear to auscultation bilaterally, respirations unlabored  Heart:    Regular rate and rhythm  Neurologic:   Awake, alert, oriented x 3. No apparent focal neurological           defect.           Assessment & Plan:      1. Labile blood pressure Change from metoprolol tartate to metoprolol succinate. Will start with 25mg  a day and titrated up if needed and if tolerating well.  - metoprolol succinate (TOPROL-XL) 50 MG 24 hr tablet; Take 1/2 tablet daily. Take in place of metoprolol tartate. 100mg   Dispense: 30 tablet; Refill: 1  Return in about 4 weeks (around 04/22/2017).       Lelon Huh, MD  [facility]

## 2017-03-29 ENCOUNTER — Telehealth: Payer: Self-pay | Admitting: Urology

## 2017-03-29 NOTE — Telephone Encounter (Signed)
Spoke with pt in reference to gross hematuria. Pt stated that he is only able to urinate very little and still have to perform CIC. Pt stated that when he cath'd himself at 1:30 he got a few small blood clots and bright red blood. Pt voiced concern. Reinforced with pt that bleeding and small clots are normal after a TURPT. Reinforced with pt to increase fluid intake and monitor color of urine. Pt will call back later in the week to give an update. Pt voiced understanding of whole conversation.

## 2017-03-29 NOTE — Telephone Encounter (Signed)
Pt called office stating that the past few times that he has self cathed he has seen very red blood. Wants to speak to the nurse about this. Please advise. Thanks

## 2017-04-03 ENCOUNTER — Other Ambulatory Visit: Payer: Self-pay | Admitting: Family Medicine

## 2017-04-03 NOTE — Telephone Encounter (Signed)
Please call in temezepam.  

## 2017-04-04 ENCOUNTER — Other Ambulatory Visit: Payer: Self-pay

## 2017-04-04 MED ORDER — TEMAZEPAM 30 MG PO CAPS: ORAL_CAPSULE | ORAL | 1 refills | Status: DC

## 2017-04-05 NOTE — Telephone Encounter (Signed)
Rx called in to pharmacy. 

## 2017-04-06 ENCOUNTER — Encounter: Payer: Self-pay | Admitting: Urology

## 2017-04-06 ENCOUNTER — Ambulatory Visit (INDEPENDENT_AMBULATORY_CARE_PROVIDER_SITE_OTHER): Payer: PPO | Admitting: Urology

## 2017-04-06 VITALS — BP 108/65 | HR 116 | Ht 73.0 in | Wt 250.0 lb

## 2017-04-06 DIAGNOSIS — R350 Frequency of micturition: Secondary | ICD-10-CM | POA: Diagnosis not present

## 2017-04-06 DIAGNOSIS — N138 Other obstructive and reflux uropathy: Secondary | ICD-10-CM

## 2017-04-06 DIAGNOSIS — N323 Diverticulum of bladder: Secondary | ICD-10-CM

## 2017-04-06 DIAGNOSIS — R351 Nocturia: Secondary | ICD-10-CM | POA: Diagnosis not present

## 2017-04-06 DIAGNOSIS — N401 Enlarged prostate with lower urinary tract symptoms: Secondary | ICD-10-CM

## 2017-04-06 NOTE — Progress Notes (Signed)
04/06/2017 9:35 AM   Bryan Nelson 1931-09-15 [id number]  Referring provider: [name], MD [address] [place], [place] [address]   HPI: 83 year old male with history of BPH, chronic urinary retention currently managed with intermittent catheterization who recently underwent TURP on 03/02/2017.  Postoperatively, he was able to be discharged from the hospital without a catheter. The following day, he developed urinary retention and was seen in the emergency room. He is now resumed back on clean intermittent catheterization.  He reports that he is having an easier time passing the catheter was interested in trying a different type.  He continues on Flomax and finasteride.  He does complain today of dysuria.  Overall, he does think that he is voiding more independently that he used to. He now voids 6-7 times a day, approximately 100 cc at a time. He caths 3 times a day for approximately 250 cc. His primary complaint today is nocturia 3-4.  Past urologic history: Patient underwent laser ablation of his prostate, green light by Dr. Elnoria Howard in 2016. Since then, he has had significant issues including chronic urinary retention managed by intermittent catheterization. His also had recurrent urinary tract infections presumably related to self catheter and self cathetering technique.  More recently, he had issues passing his catheter requiring use of a coud.  He describes occasional issues with meeting resistance, and occasionally sees blood when catheter placement is uncomfortable.   The patient reports that even before prostate surgery, he is having difficulty emptying his bladder. He was previously managed by Dr. Bernardo Heater at [facility] urology. He's also had urodynamics in the past but unable to generate contraction therefore no pressure flow study was performed.  Most recent CT scan from 03/14/2018shows persistent prostamegaly without significant TURP defect. He also has a  thickened bladder wall and a large bladder diverticulum on the left. Calculated prostate volume 69 cc.  PSA  0.81 11/22/16.  Most recently, cystoscopy revealed an irregular prostatic contour with bilateral coaptation, small posterior prostatic fossa false pass, and a large wide mouth bladder diverticulum on the left.  PMH: Past Medical History:  Diagnosis Date  . Arteriosclerosis of coronary artery 08/26/2011   Overview:      a.  1999 PCI of the mid LAD with stent.      b.  2002 Cath [place]: EF 56%. RCA-distal 25/25%. Left main-50% ostial.  Left circumflex-25% OM2.  LAD-25% proximal.  75% mid.  25/25% distal. 75% D1.      c.  07/2011 PCI of RCA with DES.[place]   . Bleeding gastrointestinal 08/26/2011   Overview:      a.  Chronic abdominal pain, present improving.      b.  Duodenitis and gastritis by EGD in 2000.      c.  Pylori in 2000, treated.      d.  Gastroesophageal reflux disease.      e.  Diverticular disease.    . BP (high blood pressure) 08/26/2011  . Cancer ([place]) 2015   Lymphoma  . GERD (gastroesophageal reflux disease)   . Heart disease   . Helicobacter pylori infection 06/18/2015   by EGD RX 08/18/1999   . History of adenomatous polyp of colon 06/18/2015  . HOH (hard of hearing)    Bilateral Hearing  Aids    Surgical History: Past Surgical History:  Procedure Laterality Date  . ANGIOPLASTY    . CATARACT EXTRACTION    . COLONOSCOPY  2016  . CORONARY ANGIOPLASTY    .  EYE SURGERY Bilateral    Cataract Extraction with IOL  . GREEN LIGHT LASER TURP (TRANSURETHRAL RESECTION OF PROSTATE N/A 04/16/2015   Procedure: GREEN LIGHT LASER TURP (TRANSURETHRAL RESECTION OF PROSTATE WITH BLADDER BIOPSY;  Surgeon: Collier Flowers, MD;  Location: [facility] ORS;  Service: Urology;  Laterality: N/A;  . heart stent placement     1998, 2000, 2012  . TRANSURETHRAL RESECTION OF PROSTATE N/A 03/02/2017   Procedure: TRANSURETHRAL RESECTION OF THE PROSTATE (TURP);  Surgeon: Hollice Espy, MD;  Location:  [facility] ORS;  Service: Urology;  Laterality: N/A;    Home Medications:  Allergies as of 04/06/2017      Reactions   Ciprofloxacin Diarrhea   Lisinopril Other (See Comments)   Unknown    Penicillins Swelling   unknwon reaction.  tolerates amoxicillin Has patient had a PCN reaction causing immediate rash, facial/tongue/throat swelling, SOB or lightheadedness with hypotension: No Has patient had a PCN reaction causing severe rash involving mucus membranes or skin necrosis: No Has patient had a PCN reaction that required hospitalization: No Has patient had a PCN reaction occurring within the last 10 years: Yes If all of the above answers are "NO", then may proceed with Cephalosporin use.   Sulfa Antibiotics Itching, Rash      Medication List       Accurate as of 04/06/17  9:35 AM. Always use your most recent med list.          aspirin 81 MG tablet Take 81 mg by mouth daily.   atorvastatin 10 MG tablet Commonly known as:  LIPITOR Take 1 tablet (10 mg total) by mouth daily.   clopidogrel 75 MG tablet Commonly known as:  PLAVIX Take 1 tablet (75 mg total) by mouth daily.   docusate sodium 100 MG capsule Commonly known as:  COLACE Take 1 capsule (100 mg total) by mouth 2 (two) times daily.   finasteride 5 MG tablet Commonly known as:  PROSCAR Take 1 tablet (5 mg total) by mouth daily.   isosorbide dinitrate 30 MG tablet Commonly known as:  ISORDIL Take 1 tablet (30 mg total) by mouth every morning.   metoprolol succinate 50 MG 24 hr tablet Commonly known as:  TOPROL-XL Take 1/2 tablet daily. Take in place of metoprolol tartate. 100mg    nitroGLYCERIN 0.4 MG SL tablet Commonly known as:  NITROSTAT Place 0.4 mg under the tongue every 5 (five) minutes as needed for chest pain. Reported on 03/10/2016   omeprazole 40 MG capsule Commonly known as:  PRILOSEC Take 1 capsule (40 mg total) by mouth 2 (two) times daily.   ramipril 2.5 MG capsule Commonly known as:  ALTACE Take 1  capsule (2.5 mg total) by mouth daily.   ranitidine 150 MG tablet Commonly known as:  ZANTAC Take 1 tablet (150 mg total) by mouth 2 (two) times daily. Reported on 03/10/2016   sucralfate 1 g tablet Commonly known as:  CARAFATE Take 1 tablet (1 g total) by mouth 2 (two) times daily.   tamsulosin 0.4 MG Caps capsule Commonly known as:  FLOMAX Take 1 capsule (0.4 mg total) by mouth daily.   temazepam 30 MG capsule Commonly known as:  RESTORIL take 1 capsule by mouth at bedtime if needed   Vitamin D3 1000 units Caps Take 1,000 Units by mouth daily.       Allergies:  Allergies  Allergen Reactions  . Ciprofloxacin Diarrhea  . Lisinopril Other (See Comments)    Unknown   . Penicillins Swelling  unknwon reaction.  tolerates amoxicillin Has patient had a PCN reaction causing immediate rash, facial/tongue/throat swelling, SOB or lightheadedness with hypotension: No Has patient had a PCN reaction causing severe rash involving mucus membranes or skin necrosis: No Has patient had a PCN reaction that required hospitalization: No Has patient had a PCN reaction occurring within the last 10 years: Yes If all of the above answers are "NO", then may proceed with Cephalosporin use.   . Sulfa Antibiotics Itching and Rash    Family History: Family History  Problem Relation Age of Onset  . Osteoporosis Mother   . Alzheimer's disease Father   . Kidney disease Neg Hx   . Prostate cancer Neg Hx   . Bladder Cancer Neg Hx   . Kidney cancer Neg Hx     Social History:  reports that he quit smoking about 45 years ago. His smoking use included Cigarettes. He has a 18.00 pack-year smoking history. He has never used smokeless tobacco. He reports that he does not drink alcohol or use drugs.  ROS: UROLOGY Frequent Urination?: No Hard to postpone urination?: No Burning/pain with urination?: Yes Get up at night to urinate?: Yes Leakage of urine?: No Urine stream starts and stops?:  No Trouble starting stream?: No Do you have to strain to urinate?: No Blood in urine?: No Urinary tract infection?: No Sexually transmitted disease?: No Injury to kidneys or bladder?: No Painful intercourse?: No Weak stream?: No Erection problems?: No Penile pain?: No  Gastrointestinal Nausea?: No Vomiting?: No Indigestion/heartburn?: No Diarrhea?: No Constipation?: No  Constitutional Fever: No Night sweats?: No Weight loss?: No Fatigue?: No  Skin Skin rash/lesions?: No Itching?: No  Eyes Blurred vision?: No Double vision?: No  Ears/Nose/Throat Sore throat?: No Sinus problems?: No  Hematologic/Lymphatic Swollen glands?: No Easy bruising?: No  Cardiovascular Leg swelling?: No Chest pain?: No  Respiratory Cough?: No Shortness of breath?: No  Endocrine Excessive thirst?: No  Musculoskeletal Back pain?: No Joint pain?: No  Neurological Headaches?: No Dizziness?: No  Psychologic Depression?: No Anxiety?: No  Physical Exam: BP 108/65 (BP Location: Left Arm, Patient Position: Sitting, Cuff Size: Normal)   Pulse (!) 116   Ht 6\' 1"  (1.854 m)   Wt 250 lb (113.4 kg)   BMI 32.98 kg/m   Constitutional:  Alert and oriented, No acute distress.  Accompanied by friend/ family member today.    HEENT: [place] AT, moist mucus membranes.  Trachea midline, no masses. Cardiovascular: No clubbing, cyanosis, or edema. Respiratory: Normal respiratory effort, no increased work of breathing. Skin: No rashes, bruises or suspicious lesions. Neurologic: Grossly intact, no focal deficits, moving all 4 extremities. Psychiatric: Normal mood and affect.  Laboratory Data: Lab Results  Component Value Date   WBC 5.4 02/18/2017   HGB 12.9 (L) 03/03/2017   HCT 38.3 (L) 03/03/2017   MCV 88.3 02/18/2017   PLT 170 02/18/2017    Lab Results  Component Value Date   CREATININE 1.19 03/03/2017    Lab Results  Component Value Date   PSA 0.81 11/22/2016   PSA 0.9  02/27/2014    Lab Results  Component Value Date   HGBA1C 5.9 11/22/2016    Urinalysis N/a  Pertinent Imaging: No new imaging  Assessment & Plan:    1. Urine retention Some improvement in spontaneous urination since TURP but ongoing incomplete bladder emptying Continue daily clean intermittent catheter using coud tip catheter We discussed continuation of current regimen for the next 3 months to see if there is any  improvement May consider addition of anticholinergic at that point in time if he continues to have urinary frequency and nocturia If symptoms continue thereafter, may consider urodynamics with cystogram to assess whether or not he would benefit from diverticulectomy  2. Recurrent UTI Bacterial colonization related to clean intermittent catheterization Recheck urine today insetting of dysuria although this is most likely postsurgical prostatic irritation  3. Benign prostatic hyperplasia with urinary obstruction Continue Flomax and finasteride  4. Bladder diverticulum Appreciated on most recent CT scan, likely related to chronic outlet obstruction   Hollice Espy, MD  [address] [place]  [place], [place] [address] [phone]

## 2017-04-07 LAB — URINALYSIS, COMPLETE
BILIRUBIN UA: NEGATIVE
GLUCOSE, UA: NEGATIVE
KETONES UA: NEGATIVE
Nitrite, UA: NEGATIVE
UUROB: 0.2 mg/dL (ref 0.2–1.0)
pH, UA: 6 (ref 5.0–7.5)

## 2017-04-07 LAB — MICROSCOPIC EXAMINATION: RBC, UA: >30 /hpf — ABNORMAL HIGH (ref 0–?)

## 2017-04-07 NOTE — Addendum Note (Signed)
Addended by: Tommy Rainwater on: 04/07/2017 11:40 AM   Modules accepted: Orders

## 2017-04-10 LAB — CULTURE, URINE COMPREHENSIVE

## 2017-04-11 ENCOUNTER — Telehealth: Payer: Self-pay | Admitting: Family Medicine

## 2017-04-11 MED ORDER — NITROFURANTOIN MONOHYD MACRO 100 MG PO CAPS
100.0000 mg | ORAL_CAPSULE | Freq: Two times a day (BID) | ORAL | 0 refills | Status: DC
Start: 2017-04-11 — End: 2017-04-18

## 2017-04-11 NOTE — Telephone Encounter (Signed)
Patient notified and will get ABX.  

## 2017-04-11 NOTE — Telephone Encounter (Signed)
-----   Message from Hollice Espy, MD sent at 04/10/2017  2:47 PM EDT ----- In setting of dysuria, lets treat with macrobid 100 mg bid x 10 days and see there there is any improvement.   Hollice Espy, MD

## 2017-04-18 ENCOUNTER — Ambulatory Visit (INDEPENDENT_AMBULATORY_CARE_PROVIDER_SITE_OTHER): Payer: PPO | Admitting: Family Medicine

## 2017-04-18 ENCOUNTER — Encounter: Payer: Self-pay | Admitting: Family Medicine

## 2017-04-18 VITALS — BP 124/64 | HR 116 | Temp 98.0°F | Resp 16 | Wt 257.0 lb

## 2017-04-18 DIAGNOSIS — R339 Retention of urine, unspecified: Secondary | ICD-10-CM | POA: Diagnosis not present

## 2017-04-18 DIAGNOSIS — R000 Tachycardia, unspecified: Secondary | ICD-10-CM | POA: Diagnosis not present

## 2017-04-18 DIAGNOSIS — S161XXA Strain of muscle, fascia and tendon at neck level, initial encounter: Secondary | ICD-10-CM | POA: Diagnosis not present

## 2017-04-18 MED ORDER — TIZANIDINE HCL 2 MG PO CAPS: 2.0000 mg | ORAL_CAPSULE | Freq: Three times a day (TID) | ORAL | 0 refills | Status: AC

## 2017-04-18 NOTE — Progress Notes (Signed)
Patient: Bryan Nelson Male    DOB: 1931/09/25   83 y.o.   MRN: [id number] Visit Date: 04/18/2017  Today's Provider: Lelon Huh, MD   Chief Complaint  Patient presents with  . Follow-up  . Hypertension   Subjective:    Patient states that he woke up around 1:00 am with a headache and neck pain right side of head. Patient states he took nitroglycerin and went back to sleep. Patient still has headache and neck pain. Patient also took 2 Aleve for neck pain with no relief.   He does not recall any injury. Pain starts on right posterior neck and extends up right side of scalp to parietal area. No associated neurological symptoms. Ranks it as about 8/10 and not really improved since he first woke up with pain. He states when he got up this morning he hurt all over and took nitro just in case it was his heart, but denies any chest pain or pressure. He states he did put ice on his neck which helped.    Labile blood pressure From 03/25/2017- We changed to extended release metoprolol at 1/2 of 50mg  tablet a day which he feels has been working well with SBP staying in the 130s.     Allergies  Allergen Reactions  . Ciprofloxacin Diarrhea  . Lisinopril Other (See Comments)    Unknown   . Penicillins Swelling    unknwon reaction.  tolerates amoxicillin Has patient had a PCN reaction causing immediate rash, facial/tongue/throat swelling, SOB or lightheadedness with hypotension: No Has patient had a PCN reaction causing severe rash involving mucus membranes or skin necrosis: No Has patient had a PCN reaction that required hospitalization: No Has patient had a PCN reaction occurring within the last 10 years: Yes If all of the above answers are "NO", then may proceed with Cephalosporin use.   . Sulfa Antibiotics Itching and Rash     Current Outpatient Prescriptions:  .  aspirin 81 MG tablet, Take 81 mg by mouth daily., Disp: , Rfl:  .  atorvastatin (LIPITOR) 10 MG tablet, Take 1  tablet (10 mg total) by mouth daily., Disp: 90 tablet, Rfl: 4 .  Cholecalciferol (VITAMIN D3) 1000 units CAPS, Take 1,000 Units by mouth daily., Disp: , Rfl:  .  clopidogrel (PLAVIX) 75 MG tablet, Take 1 tablet (75 mg total) by mouth daily., Disp: 90 tablet, Rfl: 4 .  docusate sodium (COLACE) 100 MG capsule, Take 1 capsule (100 mg total) by mouth 2 (two) times daily., Disp: 60 capsule, Rfl: 0 .  finasteride (PROSCAR) 5 MG tablet, Take 1 tablet (5 mg total) by mouth daily., Disp: 90 tablet, Rfl: 3 .  isosorbide dinitrate (ISORDIL) 30 MG tablet, Take 1 tablet (30 mg total) by mouth every morning., Disp: 90 tablet, Rfl: 4 .  metoprolol succinate (TOPROL-XL) 50 MG 24 hr tablet, Take 1/2 tablet daily. Take in place of metoprolol tartate. 100mg , Disp: 30 tablet, Rfl: 1 .  nitroGLYCERIN (NITROSTAT) 0.4 MG SL tablet, Place 0.4 mg under the tongue every 5 (five) minutes as needed for chest pain. Reported on 03/10/2016, Disp: , Rfl:  .  omeprazole (PRILOSEC) 40 MG capsule, Take 1 capsule (40 mg total) by mouth 2 (two) times daily., Disp: 180 capsule, Rfl: 4 .  ramipril (ALTACE) 2.5 MG capsule, Take 1 capsule (2.5 mg total) by mouth daily., Disp: 90 capsule, Rfl: 4 .  ranitidine (ZANTAC) 150 MG tablet, Take 1 tablet (150 mg total) by mouth  2 (two) times daily. Reported on 03/10/2016, Disp: 180 tablet, Rfl: 4 .  sucralfate (CARAFATE) 1 g tablet, Take 1 tablet (1 g total) by mouth 2 (two) times daily., Disp: 60 tablet, Rfl: 4 .  tamsulosin (FLOMAX) 0.4 MG CAPS capsule, Take 1 capsule (0.4 mg total) by mouth daily., Disp: 90 capsule, Rfl: 3 .  temazepam (RESTORIL) 30 MG capsule, take 1 capsule by mouth at bedtime if needed, Disp: 90 capsule, Rfl: 1  Current Facility-Administered Medications:  .  lidocaine (XYLOCAINE) 2 % jelly 1 application, 1 application, Urethral, Once, Collier Flowers, MD  Review of Systems  Constitutional: Negative for appetite change, chills and fever.  Respiratory: Negative for chest  tightness, shortness of breath and wheezing.   Cardiovascular: Negative for chest pain and palpitations.  Gastrointestinal: Negative for abdominal pain, nausea and vomiting.  Musculoskeletal: Positive for neck pain.  Neurological: Positive for headaches.    Social History  Substance Use Topics  . Smoking status: Former Smoker    Packs/day: 1.50    Years: 12.00    Types: Cigarettes    Quit date: 08/24/1971  . Smokeless tobacco: Never Used  . Alcohol use No   Objective:   BP 124/64 (BP Location: Left Arm, Patient Position: Sitting, Cuff Size: Large)   Pulse (!) 116   Temp 98 F (36.7 C) (Oral)   Resp 16   Wt 257 lb (116.6 kg)   SpO2 97%   BMI 33.91 kg/m  Vitals:   04/18/17 1059  BP: 124/64  Pulse: (!) 116  Resp: 16  Temp: 98 F (36.7 C)  TempSrc: Oral  SpO2: 97%  Weight: 257 lb (116.6 kg)     Physical Exam   General Appearance:    Alert, cooperative, no distress  Eyes:    PERRL, conjunctiva/corneas clear, EOM's intact       Lungs:     Clear to auscultation bilaterally, respirations unlabored  Heart:    Regular rate and rhythm  Neurologic:   Awake, alert, oriented x 3. No apparent focal neurological           defect.   MS:   Tender posterior right paracervical musculature with mild spasm noted.    EKG: Sinus tachycardia- otherwise no change from 09/17/2016    Assessment & Plan:     1. Tachycardia Likely due to acute neck pain. No sign of other cardiac anomalies on EKG.  - EKG 12-Lead  2. Strain of neck muscle, initial encounter Not good candidate for NSAIDs due to being on ASA an clopidogrel  - tizanidine (ZANAFLEX) 2 MG capsule; Take 1 capsule (2 mg total) by mouth 3 (three) times daily.  Dispense: 20 capsule; Refill: 0  Call if symptoms change or if not rapidly improving.           Lelon Huh, MD  [facility]

## 2017-04-22 ENCOUNTER — Ambulatory Visit: Payer: Self-pay | Admitting: Family Medicine

## 2017-04-26 ENCOUNTER — Telehealth: Payer: Self-pay | Admitting: Family Medicine

## 2017-04-26 ENCOUNTER — Ambulatory Visit (INDEPENDENT_AMBULATORY_CARE_PROVIDER_SITE_OTHER): Payer: PPO

## 2017-04-26 VITALS — BP 146/81 | HR 92 | Ht 73.0 in | Wt 255.1 lb

## 2017-04-26 DIAGNOSIS — N390 Urinary tract infection, site not specified: Secondary | ICD-10-CM | POA: Diagnosis not present

## 2017-04-26 LAB — URINALYSIS, COMPLETE
Bilirubin, UA: NEGATIVE
GLUCOSE, UA: NEGATIVE
Ketones, UA: NEGATIVE
Nitrite, UA: NEGATIVE
SPEC GRAV UA: 1.02 (ref 1.005–1.030)
Urobilinogen, Ur: 0.2 mg/dL (ref 0.2–1.0)
pH, UA: 5.5 (ref 5.0–7.5)

## 2017-04-26 LAB — MICROSCOPIC EXAMINATION

## 2017-04-26 MED ORDER — PREDNISONE 10 MG PO TABS: ORAL_TABLET | ORAL | 0 refills | Status: AC

## 2017-04-26 NOTE — Progress Notes (Addendum)
Pt presents today with c/o urinary frequency and dysuria. A clean catch was obtained for u/a and cx. Pt has been on abx in the last 30 days for a UTI.   Blood pressure (!) 146/81, pulse 92, height 6\' 1"  (1.854 m), weight 255 lb 1.6 oz (115.7 kg).  Per Dr. Erlene Quan no abx will be given until culture results are back.

## 2017-04-26 NOTE — Telephone Encounter (Signed)
Have sent prescription for prednisone to his pharmacy, if not much better when finished I would recommend seeing dr Sharlet Salina (physiatry)

## 2017-04-26 NOTE — Telephone Encounter (Signed)
Pt states he still is having trouble in his neck and back.  He would like to know if there is something else that can be done.

## 2017-04-26 NOTE — Telephone Encounter (Signed)
Please advise 

## 2017-04-27 NOTE — Telephone Encounter (Signed)
Advised patient as below.  

## 2017-04-28 LAB — CULTURE, URINE COMPREHENSIVE

## 2017-04-29 ENCOUNTER — Telehealth: Payer: Self-pay

## 2017-04-29 NOTE — Telephone Encounter (Signed)
-----   Message from Hollice Espy, MD sent at 04/28/2017  7:52 PM EDT ----- UCx negative

## 2017-04-29 NOTE — Telephone Encounter (Signed)
Spoke with pt in reference to ucx results. Pt voiced understanding but stated he was disappointed and confused.

## 2017-05-07 ENCOUNTER — Ambulatory Visit: Payer: Self-pay

## 2017-05-09 ENCOUNTER — Telehealth: Payer: Self-pay

## 2017-05-09 NOTE — Telephone Encounter (Signed)
Pt called  C/o continued dysuria and extreme pelvic discomfort. Pt stated that his urine is clear and therefore he does not feel that it is an infection. Pt stated that he is really uncomfortable and wants something to be done. Inquired about pt taking AZO or uribel. Pt stated that he has been taking AZO intermittently for several months now and is not able to afford the uribel. Pt requested either an appt with Dr. Erlene Quan or any advise given tia telephone. Please advise.

## 2017-05-09 NOTE — Telephone Encounter (Signed)
This is a chronic issue.  Please make nonurgent follow up with me or Zara Council.    Hollice Espy, MD

## 2017-05-09 NOTE — Telephone Encounter (Signed)
Spoke with pt in reference to needing a f/u with Dr. Erlene Quan or Larene Beach. Pt stated that he would prefer to see Dr. Erlene Quan. First available f/u was made. Pt voiced understanding.

## 2017-05-10 NOTE — Progress Notes (Deleted)
[place]  [phone]  [phone]     Bryan Nelson DOB: August 18, 1932  MR#: [id number]  OHY#:[id number]  Patient Care Team: [name], MD as PCP - General (Family Medicine) Christene Lye, MD (General Surgery) Dingeldein, Remo Lipps, MD as Consulting Physician (Ophthalmology) Samara Deist, DPM as Referring Physician (Podiatry) Ernestine Conrad Gordan Payment as Physician Assistant (Urology) Conni Slipper, NP as Nurse Practitioner (Nurse Practitioner) Lloyd Huger, MD as Consulting Physician (Oncology)  CHIEF COMPLAINT: Diffuse large B-cell lymphoma.  INTERVAL HISTORY: Patient returns to clinic today for routine follow-up and discussion of his imaging results. He currently feels well and is asymptomatic. He has no neurologic complaints. He denies any recent fevers or illnesses. He has no night sweats. He denies any chest pain or shortness of breath. He has no nausea, vomiting, constipation, or diarrhea. He has no urinary complaints today. He offers no specific complaints today.   REVIEW OF SYSTEMS:   Review of Systems  Constitutional: Negative.  Negative for fever, malaise/fatigue and weight loss.  Respiratory: Negative.  Negative for cough and shortness of breath.   Cardiovascular: Negative.  Negative for chest pain and leg swelling.  Gastrointestinal: Negative.  Negative for abdominal pain.  Genitourinary: Negative.  Negative for frequency and urgency.  Musculoskeletal: Positive for joint pain.  Neurological: Negative.   Psychiatric/Behavioral: Negative.  The patient is not nervous/anxious.     As per HPI. Otherwise, a complete review of systems is negative.  ONCOLOGY HISTORY:  No history exists.    PAST MEDICAL HISTORY: Past Medical History:  Diagnosis Date  . Arteriosclerosis of coronary artery 08/26/2011   Overview:      a.  1999 PCI of the mid LAD with stent.      b.  2002 Cath [place]: EF 56%. RCA-distal 25/25%. Left  main-50% ostial.  Left circumflex-25% OM2.  LAD-25% proximal.  75% mid.  25/25% distal. 75% D1.      c.  07/2011 PCI of RCA with DES.[place]   . Bleeding gastrointestinal 08/26/2011   Overview:      a.  Chronic abdominal pain, present improving.      b.  Duodenitis and gastritis by EGD in 2000.      c.  Pylori in 2000, treated.      d.  Gastroesophageal reflux disease.      e.  Diverticular disease.    . BP (high blood pressure) 08/26/2011  . Cancer ([place]) 2015   Lymphoma  . GERD (gastroesophageal reflux disease)   . Heart disease   . Helicobacter pylori infection 06/18/2015   by EGD RX 08/18/1999   . History of adenomatous polyp of colon 06/18/2015  . HOH (hard of hearing)    Bilateral Hearing  Aids    PAST SURGICAL HISTORY: Past Surgical History:  Procedure Laterality Date  . ANGIOPLASTY    . CATARACT EXTRACTION    . COLONOSCOPY  2016  . CORONARY ANGIOPLASTY    . EYE SURGERY Bilateral    Cataract Extraction with IOL  . GREEN LIGHT LASER TURP (TRANSURETHRAL RESECTION OF PROSTATE N/A 04/16/2015   Procedure: GREEN LIGHT LASER TURP (TRANSURETHRAL RESECTION OF PROSTATE WITH BLADDER BIOPSY;  Surgeon: Collier Flowers, MD;  Location: [facility] ORS;  Service: Urology;  Laterality: N/A;  . heart stent placement     1998, 2000, 2012  . TRANSURETHRAL RESECTION OF PROSTATE N/A 03/02/2017   Procedure: TRANSURETHRAL RESECTION OF THE PROSTATE (TURP);  Surgeon: Hollice Espy, MD;  Location: [facility] ORS;  Service: Urology;  Laterality: N/A;    FAMILY HISTORY Family History  Problem Relation Age of Onset  . Osteoporosis Mother   . Alzheimer's disease Father   . Kidney disease Neg Hx   . Prostate cancer Neg Hx   . Bladder Cancer Neg Hx   . Kidney cancer Neg Hx    AVANCED DIRECTIVES:    HEALTH MAINTENANCE: Social History  Substance Use Topics  . Smoking status: Former Smoker    Packs/day: 1.50    Years: 12.00    Types: Cigarettes    Quit date: 08/24/1971  . Smokeless tobacco: Never Used  .  Alcohol use No    Allergies  Allergen Reactions  . Ciprofloxacin Diarrhea  . Lisinopril Other (See Comments)    Unknown   . Penicillins Swelling    unknwon reaction.  tolerates amoxicillin Has patient had a PCN reaction causing immediate rash, facial/tongue/throat swelling, SOB or lightheadedness with hypotension: No Has patient had a PCN reaction causing severe rash involving mucus membranes or skin necrosis: No Has patient had a PCN reaction that required hospitalization: No Has patient had a PCN reaction occurring within the last 10 years: Yes If all of the above answers are "NO", then may proceed with Cephalosporin use.   . Sulfa Antibiotics Itching and Rash    Current Outpatient Prescriptions  Medication Sig Dispense Refill  . aspirin 81 MG tablet Take 81 mg by mouth daily.    Marland Kitchen atorvastatin (LIPITOR) 10 MG tablet Take 1 tablet (10 mg total) by mouth daily. 90 tablet 4  . Cholecalciferol (VITAMIN D3) 1000 units CAPS Take 1,000 Units by mouth daily.    . clopidogrel (PLAVIX) 75 MG tablet Take 1 tablet (75 mg total) by mouth daily. 90 tablet 4  . docusate sodium (COLACE) 100 MG capsule Take 1 capsule (100 mg total) by mouth 2 (two) times daily. 60 capsule 0  . finasteride (PROSCAR) 5 MG tablet Take 1 tablet (5 mg total) by mouth daily. 90 tablet 3  . isosorbide dinitrate (ISORDIL) 30 MG tablet Take 1 tablet (30 mg total) by mouth every morning. 90 tablet 4  . metoprolol succinate (TOPROL-XL) 50 MG 24 hr tablet Take 1/2 tablet daily. Take in place of metoprolol tartate. 100mg  30 tablet 1  . nitroGLYCERIN (NITROSTAT) 0.4 MG SL tablet Place 0.4 mg under the tongue every 5 (five) minutes as needed for chest pain. Reported on 03/10/2016    . omeprazole (PRILOSEC) 40 MG capsule Take 1 capsule (40 mg total) by mouth 2 (two) times daily. 180 capsule 4  . ramipril (ALTACE) 2.5 MG capsule Take 1 capsule (2.5 mg total) by mouth daily. 90 capsule 4  . ranitidine (ZANTAC) 150 MG tablet Take 1  tablet (150 mg total) by mouth 2 (two) times daily. Reported on 03/10/2016 180 tablet 4  . sucralfate (CARAFATE) 1 g tablet Take 1 tablet (1 g total) by mouth 2 (two) times daily. 60 tablet 4  . tamsulosin (FLOMAX) 0.4 MG CAPS capsule Take 1 capsule (0.4 mg total) by mouth daily. 90 capsule 3  . temazepam (RESTORIL) 30 MG capsule take 1 capsule by mouth at bedtime if needed 90 capsule 1   Current Facility-Administered Medications  Medication Dose Route Frequency Provider Last Rate Last Dose  . lidocaine (XYLOCAINE) 2 % jelly 1 application  1 application Urethral Once Collier Flowers, MD        OBJECTIVE: There were no vitals taken for this visit.   There is no height  or weight on file to calculate BMI.    ECOG FS:0 - Asymptomatic  General: Well-developed, well-nourished, no acute distress. Eyes: Pink conjunctiva, anicteric sclera. Lungs: Clear to auscultation bilaterally. Heart: Regular rate and rhythm. No rubs, murmurs, or gallops. Abdomen: Soft, nontender, nondistended. No organomegaly noted, normoactive bowel sounds. Musculoskeletal: No edema, cyanosis, or clubbing. Neuro: Alert, answering all questions appropriately. Cranial nerves grossly intact. Skin: No rashes or petechiae noted. Psych: Normal affect. Lymphatics: No cervical, calvicular, axillary  LAD.   LAB RESULTS:  No visits with results within 3 Day(s) from this visit.  Latest known visit with results is:  Clinical Support on 04/26/2017  Component Date Value Ref Range Status  . Urine Culture, Comprehensive 04/26/2017 Final report   Final  . Organism ID, Bacteria 04/26/2017 Comment   Final   Comment: Mixed urogenital flora 900 Colonies/mL   . Specific Gravity, UA 04/26/2017 1.020  1.005 - 1.030 Final  . pH, UA 04/26/2017 5.5  5.0 - 7.5 Final  . Color, UA 04/26/2017 Yellow  Yellow Final  . Appearance Ur 04/26/2017 Cloudy* Clear Final  . Leukocytes, UA 04/26/2017 1+* Negative Final  . Protein, UA 04/26/2017 1+*  Negative/Trace Final  . Glucose, UA 04/26/2017 Negative  Negative Final  . Ketones, UA 04/26/2017 Negative  Negative Final  . RBC, UA 04/26/2017 2+* Negative Final  . Bilirubin, UA 04/26/2017 Negative  Negative Final  . Urobilinogen, Ur 04/26/2017 0.2  0.2 - 1.0 mg/dL Final  . Nitrite, UA 04/26/2017 Negative  Negative Final  . Microscopic Examination 04/26/2017 See below:   Final  . WBC, UA 04/26/2017 11-30* 0 - 5 /hpf Final  . RBC, UA 04/26/2017 3-10* 0 - 2 /hpf Final  . Epithelial Cells (non renal) 04/26/2017 0-10  0 - 10 /hpf Final  . Mucus, UA 04/26/2017 Present* Not Estab. Final  . Bacteria, UA 04/26/2017 Few* None seen/Few Final    STUDIES: No results found.   ASSESSMENT & PLAN:    1. Diffuse Large B Cell Lymphoma: Patient initially diagnosed in August 2015. 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 He completed 6 cycles of R-CHOP in January 2016. PET scan in January 2017 revealed no evidence of disease. CT scan of the chest, abdomen, and pelvis on November 03, 2016 reviewed independently revealed no evidence of disease. Now that patient is over 2 years removed from treatment, can continue with evaluation every 6 months and yearly imaging. Return to clinic in 6 months for further evaluation.   Patient expressed understanding and was in agreement with this plan. He also understands that He can call clinic at any time with any questions, concerns, or complaints.    Lloyd Huger, MD   05/10/2017 10:02 PM

## 2017-05-11 ENCOUNTER — Other Ambulatory Visit: Payer: Self-pay

## 2017-05-11 ENCOUNTER — Encounter: Payer: Self-pay | Admitting: Urology

## 2017-05-11 ENCOUNTER — Ambulatory Visit: Payer: Self-pay | Admitting: Urology

## 2017-05-11 DIAGNOSIS — C8330 Diffuse large B-cell lymphoma, unspecified site: Secondary | ICD-10-CM

## 2017-05-12 ENCOUNTER — Ambulatory Visit (INDEPENDENT_AMBULATORY_CARE_PROVIDER_SITE_OTHER): Payer: PPO | Admitting: Family Medicine

## 2017-05-12 ENCOUNTER — Inpatient Hospital Stay: Payer: PPO

## 2017-05-12 ENCOUNTER — Ambulatory Visit: Payer: Self-pay

## 2017-05-12 ENCOUNTER — Inpatient Hospital Stay: Payer: PPO | Admitting: Oncology

## 2017-05-12 DIAGNOSIS — Z23 Encounter for immunization: Secondary | ICD-10-CM | POA: Diagnosis not present

## 2017-05-13 ENCOUNTER — Encounter: Payer: Self-pay | Admitting: Urology

## 2017-05-13 ENCOUNTER — Ambulatory Visit (INDEPENDENT_AMBULATORY_CARE_PROVIDER_SITE_OTHER): Payer: PPO | Admitting: Urology

## 2017-05-13 VITALS — BP 134/70 | HR 111 | Ht 73.0 in | Wt 250.0 lb

## 2017-05-13 DIAGNOSIS — N401 Enlarged prostate with lower urinary tract symptoms: Secondary | ICD-10-CM | POA: Diagnosis not present

## 2017-05-13 DIAGNOSIS — R339 Retention of urine, unspecified: Secondary | ICD-10-CM

## 2017-05-13 DIAGNOSIS — N138 Other obstructive and reflux uropathy: Secondary | ICD-10-CM | POA: Diagnosis not present

## 2017-05-13 DIAGNOSIS — N390 Urinary tract infection, site not specified: Secondary | ICD-10-CM

## 2017-05-13 DIAGNOSIS — R300 Dysuria: Secondary | ICD-10-CM

## 2017-05-13 LAB — URINALYSIS, COMPLETE
Bilirubin, UA: NEGATIVE
GLUCOSE, UA: NEGATIVE
Ketones, UA: NEGATIVE
NITRITE UA: POSITIVE — AB
Specific Gravity, UA: 1.02 (ref 1.005–1.030)
UUROB: 0.2 mg/dL (ref 0.2–1.0)
pH, UA: 6 (ref 5.0–7.5)

## 2017-05-13 LAB — MICROSCOPIC EXAMINATION

## 2017-05-13 MED ORDER — CIPROFLOXACIN HCL 500 MG PO TABS: 500.0000 mg | ORAL_TABLET | Freq: Two times a day (BID) | ORAL | 0 refills | Status: DC

## 2017-05-13 NOTE — Progress Notes (Signed)
05/13/2017 9:42 AM   [name] 13-Mar-1932 [id number]  Referring provider: [name], MD [address] [place], [place] [address]   HPI: 83 year old male with history of BPH, chronic urinary retention currently managed with intermittent catheterization who recently underwent TURP on 03/02/2017.  Today, he comes into the office complaining of ongoing dysuria without gross hematuria, fevers, or chills. UA is highly suspicious for infection.  He also complains today of ongoing urinary urgency and frequency despite catheterizing. He catheterizes his about 4 times daily but notes that 15 minutes after cathetering and emptying, he'll of urge to void again but he unable to. The surgical every 15 minutes or so and he voids very small amounts. This is very bothersome to him.  He continues on Flomax and finasteride.  Past urologic history: Patient underwent laser ablation of his prostate, green light by Dr. Elnoria Howard in 2016. Since then, he has had significant issues including chronic urinary retention managed by intermittent catheterization. His also had recurrent urinary tract infections presumably related to self catheter and self cathetering technique.  More recently, he had issues passing his catheter requiring use of a coud.  He describes occasional issues with meeting resistance, and occasionally sees blood when catheter placement is uncomfortable.  The patient reports that even before prostate surgery, he is having difficulty emptying his bladder. He was previously managed by [facility]. Bernardo Heater at [facility] urology. He's also had urodynamics in the past but unable to generate contraction therefore no pressure flow study was performed.  Most recent CT scan from 03/14/2018shows persistent prostamegaly without significant TURP defect. He also has a thickened bladder wall and a large bladder diverticulum on the left. Calculated prostate volume 69 cc.  PSA  0.81 11/22/16.  Most recently,  cystoscopy revealed an irregular prostatic contour with bilateral coaptation, small posterior prostatic fossa false pass, and a large wide mouth bladder diverticulum on the left.  PMH: Past Medical History:  Diagnosis Date  . Arteriosclerosis of coronary artery 08/26/2011   Overview:      a.  1999 PCI of the mid LAD with stent.      b.  2002 Cath [place]: EF 56%. RCA-distal 25/25%. Left main-50% ostial.  Left circumflex-25% OM2.  LAD-25% proximal.  75% mid.  25/25% distal. 75% D1.      c.  07/2011 PCI of RCA with DES.[place]   . Bleeding gastrointestinal 08/26/2011   Overview:      a.  Chronic abdominal pain, present improving.      b.  Duodenitis and gastritis by EGD in 2000.      c.  Pylori in 2000, treated.      d.  Gastroesophageal reflux disease.      e.  Diverticular disease.    . BP (high blood pressure) 08/26/2011  . Cancer ([place]) 2015   Lymphoma  . GERD (gastroesophageal reflux disease)   . Heart disease   . Helicobacter pylori infection 06/18/2015   by EGD RX 08/18/1999   . History of adenomatous polyp of colon 06/18/2015  . HOH (hard of hearing)    Bilateral Hearing  Aids    Surgical History: Past Surgical History:  Procedure Laterality Date  . ANGIOPLASTY    . CATARACT EXTRACTION    . COLONOSCOPY  2016  . CORONARY ANGIOPLASTY    . EYE SURGERY Bilateral    Cataract Extraction with IOL  . GREEN LIGHT LASER TURP (TRANSURETHRAL RESECTION OF PROSTATE N/A 04/16/2015   Procedure: GREEN LIGHT LASER  TURP (TRANSURETHRAL RESECTION OF PROSTATE WITH BLADDER BIOPSY;  Surgeon: Collier Flowers, MD;  Location: [facility] ORS;  Service: Urology;  Laterality: N/A;  . heart stent placement     1998, 2000, 2012  . TRANSURETHRAL RESECTION OF PROSTATE N/A 03/02/2017   Procedure: TRANSURETHRAL RESECTION OF THE PROSTATE (TURP);  Surgeon: Hollice Espy, MD;  Location: [facility] ORS;  Service: Urology;  Laterality: N/A;    Home Medications:  Allergies as of 05/13/2017      Reactions   Ciprofloxacin Diarrhea     Lisinopril Other (See Comments)   Unknown    Penicillins Swelling   unknwon reaction.  tolerates amoxicillin Has patient had a PCN reaction causing immediate rash, facial/tongue/throat swelling, SOB or lightheadedness with hypotension: No Has patient had a PCN reaction causing severe rash involving mucus membranes or skin necrosis: No Has patient had a PCN reaction that required hospitalization: No Has patient had a PCN reaction occurring within the last 10 years: Yes If all of the above answers are "NO", then may proceed with Cephalosporin use.   Sulfa Antibiotics Itching, Rash      Medication List       Accurate as of 05/13/17  9:42 AM. Always use your most recent med list.          aspirin 81 MG tablet Take 81 mg by mouth daily.   atorvastatin 10 MG tablet Commonly known as:  LIPITOR Take 1 tablet (10 mg total) by mouth daily.   clopidogrel 75 MG tablet Commonly known as:  PLAVIX Take 1 tablet (75 mg total) by mouth daily.   docusate sodium 100 MG capsule Commonly known as:  COLACE Take 1 capsule (100 mg total) by mouth 2 (two) times daily.   finasteride 5 MG tablet Commonly known as:  PROSCAR Take 1 tablet (5 mg total) by mouth daily.   isosorbide dinitrate 30 MG tablet Commonly known as:  ISORDIL Take 1 tablet (30 mg total) by mouth every morning.   metoprolol succinate 50 MG 24 hr tablet Commonly known as:  TOPROL-XL Take 1/2 tablet daily. Take in place of metoprolol tartate. 100mg    nitroGLYCERIN 0.4 MG SL tablet Commonly known as:  NITROSTAT Place 0.4 mg under the tongue every 5 (five) minutes as needed for chest pain. Reported on 03/10/2016   omeprazole 40 MG capsule Commonly known as:  PRILOSEC Take 1 capsule (40 mg total) by mouth 2 (two) times daily.   ramipril 2.5 MG capsule Commonly known as:  ALTACE Take 1 capsule (2.5 mg total) by mouth daily.   ranitidine 150 MG tablet Commonly known as:  ZANTAC Take 1 tablet (150 mg total) by mouth 2  (two) times daily. Reported on 03/10/2016   sucralfate 1 g tablet Commonly known as:  CARAFATE Take 1 tablet (1 g total) by mouth 2 (two) times daily.   tamsulosin 0.4 MG Caps capsule Commonly known as:  FLOMAX Take 1 capsule (0.4 mg total) by mouth daily.   temazepam 30 MG capsule Commonly known as:  RESTORIL take 1 capsule by mouth at bedtime if needed   Vitamin D3 1000 units Caps Take 1,000 Units by mouth daily.            Discharge Care Instructions        Start     Ordered   05/13/17 0000  Urinalysis, Complete     05/13/17 0907      Allergies:  Allergies  Allergen Reactions  . Ciprofloxacin Diarrhea  . Lisinopril Other (  See Comments)    Unknown   . Penicillins Swelling    unknwon reaction.  tolerates amoxicillin Has patient had a PCN reaction causing immediate rash, facial/tongue/throat swelling, SOB or lightheadedness with hypotension: No Has patient had a PCN reaction causing severe rash involving mucus membranes or skin necrosis: No Has patient had a PCN reaction that required hospitalization: No Has patient had a PCN reaction occurring within the last 10 years: Yes If all of the above answers are "NO", then may proceed with Cephalosporin use.   . Sulfa Antibiotics Itching and Rash    Family History: Family History  Problem Relation Age of Onset  . Osteoporosis Mother   . Alzheimer's disease Father   . Kidney disease Neg Hx   . Prostate cancer Neg Hx   . Bladder Cancer Neg Hx   . Kidney cancer Neg Hx     Social History:  reports that he quit smoking about 45 years ago. His smoking use included Cigarettes. He has a 18.00 pack-year smoking history. He has never used smokeless tobacco. He reports that he does not drink alcohol or use drugs.  ROS: UROLOGY Frequent Urination?: Yes Hard to postpone urination?: No Burning/pain with urination?: Yes Get up at night to urinate?: No Leakage of urine?: No Urine stream starts and stops?: No Trouble  starting stream?: No Do you have to strain to urinate?: No Blood in urine?: No Urinary tract infection?: No Sexually transmitted disease?: No Injury to kidneys or bladder?: No Painful intercourse?: No Weak stream?: No Erection problems?: No Penile pain?: No  Gastrointestinal Nausea?: No Vomiting?: No Indigestion/heartburn?: No Diarrhea?: No Constipation?: No  Constitutional Fever: No Night sweats?: No Weight loss?: No Fatigue?: No  Skin Skin rash/lesions?: No Itching?: No  Eyes Blurred vision?: No Double vision?: No  Ears/Nose/Throat Sore throat?: No Sinus problems?: No  Hematologic/Lymphatic Swollen glands?: No Easy bruising?: No  Cardiovascular Leg swelling?: No Chest pain?: No  Respiratory Cough?: No Shortness of breath?: No  Endocrine Excessive thirst?: No  Musculoskeletal Back pain?: No Joint pain?: No  Neurological Headaches?: No Dizziness?: No  Psychologic Depression?: No Anxiety?: No  Physical Exam: BP 134/70   Pulse (!) 111   Ht 6\' 1"  (1.854 m)   Wt 250 lb (113.4 kg)   BMI 32.98 kg/m   Constitutional:  Alert and oriented, No acute distress.  Accompanied by friend/ family member today.    HEENT: [place] AT, moist mucus membranes.  Trachea midline, no masses. Cardiovascular: No clubbing, cyanosis, or edema. Respiratory: Normal respiratory effort, no increased work of breathing. Skin: No rashes, bruises or suspicious lesions. Neurologic: Grossly intact, no focal deficits, moving all 4 extremities. Psychiatric: Normal mood and affect.  Laboratory Data: Lab Results  Component Value Date   WBC 5.4 02/18/2017   HGB 12.9 (L) 03/03/2017   HCT 38.3 (L) 03/03/2017   MCV 88.3 02/18/2017   PLT 170 02/18/2017    Lab Results  Component Value Date   CREATININE 1.19 03/03/2017    Lab Results  Component Value Date   PSA 0.81 11/22/2016   PSA 0.9 02/27/2014    Lab Results  Component Value Date   HGBA1C 5.9 11/22/2016     Urinalysis UA today shows greater than 30 white blood cells, 3-10 red blood cells, many bacteria, nitrate positive.  Pertinent Imaging: No new imaging  Assessment & Plan:    1. Urine retention Continue daily clean intermittent catheter using coud tip catheter, qid as needed  2. Recurrent UTI Bacterial colonization  related to clean intermittent catheterization Today, his UA suspicious for infection and he is symptomatic-we'll treat with Cipro given that he is growing enterococcus in the past and adjust as needed  3. Benign prostatic hyperplasia with urinary obstruction and frequency Continue Flomax and finasteride Patient given samples of Mybetriq 25 mg times 4 weeks today to see if his house with his urgency and frequency symptoms, advised to call us after the trial was complete to let us know how it goes If effective, we'll call in prescription, if minimal effect, we'll increase dose to 50 mg  4. Bladder diverticulum Appreciated on most recent CT scan, likely related to chronic outlet obstruction  Follow-up in November scheduled  Hollice Espy, MD  [address] [place]  [place], [place] [address] [phone]

## 2017-05-14 ENCOUNTER — Other Ambulatory Visit: Payer: Self-pay | Admitting: Family Medicine

## 2017-05-15 NOTE — Telephone Encounter (Signed)
Please call in zolpidem  

## 2017-06-09 ENCOUNTER — Telehealth: Payer: Self-pay | Admitting: Urology

## 2017-06-09 NOTE — Telephone Encounter (Signed)
Pt took 30 days of samples, Myrbetriq 25 mg.  His problem is when he caths, he feels like he has to go again within 30 minutes.  He really doesn't think these are helping.

## 2017-06-10 NOTE — Telephone Encounter (Signed)
As discussed, with increased the dose to 50 mg.  Hollice Espy, MD

## 2017-06-10 NOTE — Telephone Encounter (Signed)
Spoke to patient. Went over last OV note-Myrbetriq instructions. Placed Mybetriq 50mg  x4 weeks sample at front desk.  Pt verbalized understanding.

## 2017-06-15 ENCOUNTER — Ambulatory Visit: Payer: Medicare Other | Admitting: Urology

## 2017-06-15 DIAGNOSIS — R339 Retention of urine, unspecified: Secondary | ICD-10-CM | POA: Diagnosis not present

## 2017-06-16 ENCOUNTER — Other Ambulatory Visit: Payer: Self-pay | Admitting: Family Medicine

## 2017-06-16 DIAGNOSIS — R0989 Other specified symptoms and signs involving the circulatory and respiratory systems: Secondary | ICD-10-CM

## 2017-06-16 MED ORDER — METOPROLOL SUCCINATE ER 50 MG PO TB24: ORAL_TABLET | ORAL | 2 refills | Status: DC

## 2017-06-23 DIAGNOSIS — H903 Sensorineural hearing loss, bilateral: Secondary | ICD-10-CM | POA: Diagnosis not present

## 2017-07-01 NOTE — Telephone Encounter (Signed)
Please have him continue Myrbetriq 50 mg for a while.  Set him up for follow-up with me in 3 months.  Hollice Espy, MD

## 2017-07-01 NOTE — Telephone Encounter (Signed)
Patient called the office today to report his symptoms after trying the samples of Myrbetriq. 30 days of 25mg  30 days of 50mg   He is feeling some success, but still having issues at night.  He is asking for your advice on how to proceed.

## 2017-07-05 ENCOUNTER — Ambulatory Visit (INDEPENDENT_AMBULATORY_CARE_PROVIDER_SITE_OTHER): Payer: PPO

## 2017-07-05 VITALS — BP 133/77 | HR 102 | Temp 98.1°F | Ht 73.0 in | Wt 246.0 lb

## 2017-07-05 DIAGNOSIS — R350 Frequency of micturition: Secondary | ICD-10-CM

## 2017-07-05 LAB — MICROSCOPIC EXAMINATION: WBC, UA: >30 /hpf — ABNORMAL HIGH (ref 0–?)

## 2017-07-05 LAB — URINALYSIS, COMPLETE
BILIRUBIN UA: NEGATIVE
Glucose, UA: NEGATIVE
KETONES UA: NEGATIVE
Nitrite, UA: NEGATIVE
SPEC GRAV UA: 1.01 (ref 1.005–1.030)
Urobilinogen, Ur: 0.2 mg/dL (ref 0.2–1.0)
pH, UA: 6 (ref 5.0–7.5)

## 2017-07-05 NOTE — Progress Notes (Signed)
Pt presents today with c/o increased urinary frequency. Pt stated that the frequency started Sunday night and is about q65min. A clean catch was obtained for u/a and cx. More myrbetriq samples were given until 48mo f/u.  Blood pressure 133/77, pulse (!) 102, temperature 98.1 F (36.7 C), height 6\' 1"  (1.854 m), weight 246 lb (111.6 kg).

## 2017-07-05 NOTE — Telephone Encounter (Signed)
Spoke with pt in reference to [facility] and seeing Dr. Erlene Quan in 42mo. Pt voiced understanding. Pt then stated that he is getting up during the night q87min and feels as though he has an infection. Pt was added to nurse schedule for today.

## 2017-07-07 LAB — CULTURE, URINE COMPREHENSIVE

## 2017-07-08 ENCOUNTER — Telehealth: Payer: Self-pay

## 2017-07-08 MED ORDER — CIPROFLOXACIN HCL 500 MG PO TABS: 500.0000 mg | ORAL_TABLET | Freq: Two times a day (BID) | ORAL | 0 refills | Status: DC

## 2017-07-08 NOTE — Telephone Encounter (Signed)
LMOM on home number No answer for cell number- medication sent to pharmacy

## 2017-07-08 NOTE — Telephone Encounter (Signed)
-----   Message from Hollice Espy, MD sent at 07/08/2017 10:45 AM EST ----- Urine culture is growing enterococcus which is grown in the past.  I understand that Cipro causes diarrhea, however, feel that this may be the best antibiotic for you.  As such, let us prescribe Cipro 500 mg twice daily times 7 days.  I would recommend a probiotic with this medication which may help with the diarrhea.  Hollice Espy, MD

## 2017-07-12 ENCOUNTER — Ambulatory Visit: Payer: PPO | Admitting: Urology

## 2017-07-12 NOTE — Telephone Encounter (Signed)
Spoke with pt who stated he got my message and picked up abx on Friday.

## 2017-07-13 DIAGNOSIS — R339 Retention of urine, unspecified: Secondary | ICD-10-CM | POA: Diagnosis not present

## 2017-08-04 NOTE — Progress Notes (Addendum)
Patient: Bryan Nelson Male    DOB: [birth year]   83 y.o.   MRN: [id number] Visit Date: 08/05/2017  Today's Provider: Lelon Huh, MD   Chief Complaint  Patient presents with  . Back Pain   Subjective:    Patient has had mid back pain for over 7 days. Pain is on right side mid back. Pain does not radiate. Patient stated that he has been doing yard work and working on ceiling. Patient doesn't know if he has pulled something or if it is muscle soreness. Patient states pain is worse during the day. He is taking otc Aleve with no relief.    Back Pain  This is a new problem. The current episode started in the past 7 days. The problem occurs intermittently. The problem is unchanged. The quality of the pain is described as aching. The pain does not radiate. The pain is at a severity of 4/10. The pain is moderate. The pain is worse during the day. The symptoms are aggravated by standing, twisting and position. Pertinent negatives include no abdominal pain, bladder incontinence, bowel incontinence, chest pain, dysuria, fever, headaches, leg pain, numbness, paresis, paresthesias, pelvic pain, perianal numbness, tingling, weakness or weight loss.   He states he usually does well with a shot of cortisone which he is interested in getting today.     Allergies  Allergen Reactions  . Ciprofloxacin Diarrhea  . Lisinopril Other (See Comments)    Unknown   . Penicillins Swelling    unknwon reaction.  tolerates amoxicillin Has patient had a PCN reaction causing immediate rash, facial/tongue/throat swelling, SOB or lightheadedness with hypotension: No Has patient had a PCN reaction causing severe rash involving mucus membranes or skin necrosis: No Has patient had a PCN reaction that required hospitalization: No Has patient had a PCN reaction occurring within the last 10 years: Yes If all of the above answers are "NO", then may proceed with Cephalosporin use.   . Sulfa Antibiotics Itching and  Rash     Current Outpatient Medications:  .  aspirin 81 MG tablet, Take 81 mg by mouth daily., Disp: , Rfl:  .  atorvastatin (LIPITOR) 10 MG tablet, Take 1 tablet (10 mg total) by mouth daily., Disp: 90 tablet, Rfl: 4 .  Cholecalciferol (VITAMIN D3) 1000 units CAPS, Take 1,000 Units by mouth daily., Disp: , Rfl:  .  clopidogrel (PLAVIX) 75 MG tablet, Take 1 tablet (75 mg total) by mouth daily., Disp: 90 tablet, Rfl: 4 .  finasteride (PROSCAR) 5 MG tablet, Take 1 tablet (5 mg total) by mouth daily., Disp: 90 tablet, Rfl: 3 .  isosorbide dinitrate (ISORDIL) 30 MG tablet, Take 1 tablet (30 mg total) by mouth every morning., Disp: 90 tablet, Rfl: 4 .  metoprolol succinate (TOPROL-XL) 50 MG 24 hr tablet, Take 1/2 tablet daily, Disp: 45 tablet, Rfl: 2 .  nitroGLYCERIN (NITROSTAT) 0.4 MG SL tablet, Place 0.4 mg under the tongue every 5 (five) minutes as needed for chest pain. Reported on 03/10/2016, Disp: , Rfl:  .  omeprazole (PRILOSEC) 40 MG capsule, Take 1 capsule (40 mg total) by mouth 2 (two) times daily., Disp: 180 capsule, Rfl: 4 .  ramipril (ALTACE) 2.5 MG capsule, Take 1 capsule (2.5 mg total) by mouth daily., Disp: 90 capsule, Rfl: 4 .  ranitidine (ZANTAC) 150 MG tablet, Take 1 tablet (150 mg total) by mouth 2 (two) times daily. Reported on 03/10/2016, Disp: 180 tablet, Rfl: 4 .  sucralfate ([place])  1 g tablet, Take 1 tablet (1 g total) by mouth 2 (two) times daily., Disp: 60 tablet, Rfl: 4 .  tamsulosin (FLOMAX) 0.4 MG CAPS capsule, Take 1 capsule (0.4 mg total) by mouth daily., Disp: 90 capsule, Rfl: 3 .  temazepam (RESTORIL) 30 MG capsule, take 1 capsule by mouth at bedtime if needed, Disp: 90 capsule, Rfl: 1 .  zolpidem (AMBIEN) 5 MG tablet, TAKE 1 TABLET BY MOUTH AT BEDTIME AS NEEDED FOR SLEEP, Disp: 30 tablet, Rfl: 4 .  ciprofloxacin (CIPRO) 500 MG tablet, Take 1 tablet (500 mg total) every 12 (twelve) hours by mouth. (Patient not taking: Reported on 08/05/2017), Disp: 14 tablet, Rfl:  0 .  docusate sodium (COLACE) 100 MG capsule, Take 1 capsule (100 mg total) by mouth 2 (two) times daily. (Patient not taking: Reported on 08/05/2017), Disp: 60 capsule, Rfl: 0  Current Facility-Administered Medications:  .  lidocaine (XYLOCAINE) 2 % jelly 1 application, 1 application, Urethral, Once, Collier Flowers, MD  Review of Systems  Constitutional: Negative for appetite change, chills, fever and weight loss.  Respiratory: Negative for chest tightness, shortness of breath and wheezing.   Cardiovascular: Negative for chest pain and palpitations.  Gastrointestinal: Negative for abdominal pain, bowel incontinence, nausea and vomiting.  Genitourinary: Negative for bladder incontinence, dysuria and pelvic pain.  Musculoskeletal: Positive for back pain.  Neurological: Negative for tingling, weakness, numbness, headaches and paresthesias.    Social History   Tobacco Use  . Smoking status: Former Smoker    Packs/day: 1.50    Years: 12.00    Pack years: 18.00    Types: Cigarettes    Last attempt to quit: 08/24/1971    Years since quitting: 45.9  . Smokeless tobacco: Never Used  Substance Use Topics  . Alcohol use: No    Alcohol/week: 0.0 oz   Objective:   BP 100/60 (BP Location: Right Arm, Patient Position: Sitting, Cuff Size: Large)   Pulse 88   Temp 97.8 F (36.6 C) (Oral)   Resp 16   Wt 250 lb (113.4 kg)   SpO2 94%   BMI 32.98 kg/m  Vitals:   08/05/17 0814  BP: 100/60  Pulse: 88  Resp: 16  Temp: 97.8 F (36.6 C)  TempSrc: Oral  SpO2: 94%  Weight: 250 lb (113.4 kg)     Physical Exam   General Appearance:    Alert, cooperative, no distress  Eyes:    PERRL, conjunctiva/corneas clear, EOM's intact       Lungs:     Clear to auscultation bilaterally, respirations unlabored  Heart:    Regular rate and rhythm  MS:   Diffuse tenderness mid back, mild spasm subscapularis muscles. No gross deformities. .           Assessment & Plan:     1. Acute right-sided low  back pain without sciatica  - methylPREDNISolone acetate (DEPO-MEDROL) injection 80 mg  Call if symptoms change or if not rapidly improving.          Lelon Huh, MD  [facility]

## 2017-08-05 ENCOUNTER — Ambulatory Visit: Payer: PPO | Admitting: Family Medicine

## 2017-08-05 ENCOUNTER — Encounter: Payer: Self-pay | Admitting: Family Medicine

## 2017-08-05 VITALS — BP 100/60 | HR 88 | Temp 97.8°F | Resp 16 | Wt 250.0 lb

## 2017-08-05 DIAGNOSIS — M545 Low back pain, unspecified: Secondary | ICD-10-CM

## 2017-08-05 MED ORDER — METHYLPREDNISOLONE ACETATE 80 MG/ML IJ SUSP
80.0000 mg | Freq: Once | INTRAMUSCULAR | Status: AC
  Administered 2017-08-05: 80 mg via INTRAMUSCULAR

## 2017-08-28 ENCOUNTER — Other Ambulatory Visit: Payer: Self-pay | Admitting: Family Medicine

## 2017-08-28 DIAGNOSIS — R0989 Other specified symptoms and signs involving the circulatory and respiratory systems: Secondary | ICD-10-CM

## 2017-09-07 ENCOUNTER — Ambulatory Visit (INDEPENDENT_AMBULATORY_CARE_PROVIDER_SITE_OTHER): Payer: PPO

## 2017-09-07 VITALS — BP 138/72 | HR 64 | Temp 98.6°F | Ht 73.0 in | Wt 255.6 lb

## 2017-09-07 DIAGNOSIS — Z0000 Encounter for general adult medical examination without abnormal findings: Secondary | ICD-10-CM

## 2017-09-07 NOTE — Patient Instructions (Signed)
Bryan Nelson , Thank you for taking time to come for your Medicare Wellness Visit. I appreciate your ongoing commitment to your health goals. Please review the following plan we discussed and let me know if I can assist you in the future.   Screening recommendations/referrals: Colonoscopy: Up to date Recommended yearly ophthalmology/optometry visit for glaucoma screening and checkup Recommended yearly dental visit for hygiene and checkup  Vaccinations: Influenza vaccine: Up to date Pneumococcal vaccine: Awaiting records from [place]. Tdap vaccine: Up to date Shingles vaccine: Pt declines today.     Advanced directives: Please bring a copy of your POA (Power of Attorney) and/or Living Will to your next appointment.   Conditions/risks identified: Obesity- recommend increasing water intake to 6-8 glasses a day.   Next appointment: 09/13/17 @ 9:00 AM  Preventive Care 65 Years and Older, Male Preventive care refers to lifestyle choices and visits with your health care provider that can promote health and wellness. What does preventive care include?  A yearly physical exam. This is also called an annual well check.  Dental exams once or twice a year.  Routine eye exams. Ask your health care provider how often you should have your eyes checked.  Personal lifestyle choices, including:  Daily care of your teeth and gums.  Regular physical activity.  Eating a healthy diet.  Avoiding tobacco and drug use.  Limiting alcohol use.  Practicing safe sex.  Taking low doses of aspirin every day.  Taking vitamin and mineral supplements as recommended by your health care provider. What happens during an annual well check? The services and screenings done by your health care provider during your annual well check will depend on your age, overall health, lifestyle risk factors, and family history of disease. Counseling  Your health care provider may ask you questions about your:  Alcohol  use.  Tobacco use.  Drug use.  Emotional well-being.  Home and relationship well-being.  Sexual activity.  Eating habits.  History of falls.  Memory and ability to understand (cognition).  Work and work Statistician. Screening  You may have the following tests or measurements:  Height, weight, and BMI.  Blood pressure.  Lipid and cholesterol levels. These may be checked every 5 years, or more frequently if you are over 18 years old.  Skin check.  Lung cancer screening. You may have this screening every year starting at age 20 if you have a 30-pack-year history of smoking and currently smoke or have quit within the past 15 years.  Fecal occult blood test (FOBT) of the stool. You may have this test every year starting at age 37.  Flexible sigmoidoscopy or colonoscopy. You may have a sigmoidoscopy every 5 years or a colonoscopy every 10 years starting at age 70.  Prostate cancer screening. Recommendations will vary depending on your family history and other risks.  Hepatitis C blood test.  Hepatitis B blood test.  Sexually transmitted disease (STD) testing.  Diabetes screening. This is done by checking your blood sugar (glucose) after you have not eaten for a while (fasting). You may have this done every 1-3 years.  Abdominal aortic aneurysm (AAA) screening. You may need this if you are a current or former smoker.  Osteoporosis. You may be screened starting at age 30 if you are at high risk. Talk with your health care provider about your test results, treatment options, and if necessary, the need for more tests. Vaccines  Your health care provider may recommend certain vaccines, such as:  Influenza  vaccine. This is recommended every year.  Tetanus, diphtheria, and acellular pertussis (Tdap, Td) vaccine. You may need a Td booster every 10 years.  Zoster vaccine. You may need this after age 64.  Pneumococcal 13-valent conjugate (PCV13) vaccine. One dose is  recommended after age 80.  Pneumococcal polysaccharide (PPSV23) vaccine. One dose is recommended after age 63. Talk to your health care provider about which screenings and vaccines you need and how often you need them. This information is not intended to replace advice given to you by your health care provider. Make sure you discuss any questions you have with your health care provider. Document Released: 09/05/2015 Document Revised: 04/28/2016 Document Reviewed: 06/10/2015 Elsevier Interactive Patient Education  2017 [place] Prevention in the Home Falls can cause injuries. They can happen to people of all ages. There are many things you can do to make your home safe and to help prevent falls. What can I do on the outside of my home?  Regularly fix the edges of walkways and driveways and fix any cracks.  Remove anything that might make you trip as you walk through a door, such as a raised step or threshold.  Trim any bushes or trees on the path to your home.  Use bright outdoor lighting.  Clear any walking paths of anything that might make someone trip, such as rocks or tools.  Regularly check to see if handrails are loose or broken. Make sure that both sides of any steps have handrails.  Any raised decks and porches should have guardrails on the edges.  Have any leaves, snow, or ice cleared regularly.  Use sand or salt on walking paths during winter.  Clean up any spills in your garage right away. This includes oil or grease spills. What can I do in the bathroom?  Use night lights.  Install grab bars by the toilet and in the tub and shower. Do not use towel bars as grab bars.  Use non-skid mats or decals in the tub or shower.  If you need to sit down in the shower, use a plastic, non-slip stool.  Keep the floor dry. Clean up any water that spills on the floor as soon as it happens.  Remove soap buildup in the tub or shower regularly.  Attach bath mats  securely with double-sided non-slip rug tape.  Do not have throw rugs and other things on the floor that can make you trip. What can I do in the bedroom?  Use night lights.  Make sure that you have a light by your bed that is easy to reach.  Do not use any sheets or blankets that are too big for your bed. They should not hang down onto the floor.  Have a firm chair that has side arms. You can use this for support while you get dressed.  Do not have throw rugs and other things on the floor that can make you trip. What can I do in the kitchen?  Clean up any spills right away.  Avoid walking on wet floors.  Keep items that you use a lot in easy-to-reach places.  If you need to reach something above you, use a strong step stool that has a grab bar.  Keep electrical cords out of the way.  Do not use floor polish or wax that makes floors slippery. If you must use wax, use non-skid floor wax.  Do not have throw rugs and other things on the floor that can  make you trip. What can I do with my stairs?  Do not leave any items on the stairs.  Make sure that there are handrails on both sides of the stairs and use them. Fix handrails that are broken or loose. Make sure that handrails are as long as the stairways.  Check any carpeting to make sure that it is firmly attached to the stairs. Fix any carpet that is loose or worn.  Avoid having throw rugs at the top or bottom of the stairs. If you do have throw rugs, attach them to the floor with carpet tape.  Make sure that you have a light switch at the top of the stairs and the bottom of the stairs. If you do not have them, ask someone to add them for you. What else can I do to help prevent falls?  Wear shoes that:  Do not have high heels.  Have rubber bottoms.  Are comfortable and fit you well.  Are closed at the toe. Do not wear sandals.  If you use a stepladder:  Make sure that it is fully opened. Do not climb a closed  stepladder.  Make sure that both sides of the stepladder are locked into place.  Ask someone to hold it for you, if possible.  Clearly mark and make sure that you can see:  Any grab bars or handrails.  First and last steps.  Where the edge of each step is.  Use tools that help you move around (mobility aids) if they are needed. These include:  Canes.  Walkers.  Scooters.  Crutches.  Turn on the lights when you go into a dark area. Replace any light bulbs as soon as they burn out.  Set up your furniture so you have a clear path. Avoid moving your furniture around.  If any of your floors are uneven, fix them.  If there are any pets around you, be aware of where they are.  Review your medicines with your doctor. Some medicines can make you feel dizzy. This can increase your chance of falling. Ask your doctor what other things that you can do to help prevent falls. This information is not intended to replace advice given to you by your health care provider. Make sure you discuss any questions you have with your health care provider. Document Released: 06/05/2009 Document Revised: 01/15/2016 Document Reviewed: 09/13/2014 Elsevier Interactive Patient Education  2017 [employer].

## 2017-09-07 NOTE — Progress Notes (Signed)
Subjective:   Bryan Nelson is a 84 y.o. male who presents for Medicare Annual/Subsequent preventive examination.  Review of Systems:  N/A  Cardiac Risk Factors include: advanced age (>25men, >10 women);male gender;obesity (BMI >30kg/m2);dyslipidemia;hypertension     Objective:    Vitals: BP 138/72 (BP Location: Left Arm)   Pulse 64   Temp 98.6 F (37 C) (Oral)   Ht 6\' 1"  (1.854 m)   Wt 255 lb 9.6 oz (115.9 kg)   BMI 33.72 kg/m   Body mass index is 33.72 kg/m.  Advanced Directives 09/07/2017 03/06/2017 03/02/2017 02/18/2017 12/08/2016 11/08/2016 09/14/2016  Does Patient Have a Medical Advance Directive? Yes Yes Yes Yes No;Yes Yes Yes  Type of Advance Directive Living will;Healthcare Power of [place];Living will [place];Living will [place];Living will Living will [place]  Does patient want to make changes to medical advance directive? - No - Patient declined No - Patient declined No - Patient declined - - No - Patient declined  Copy of [place] in Chart? No - copy requested No - copy requested No - copy requested - Yes - Yes  Would patient like information on creating a medical advance directive? - - - - - - No - Patient declined    Tobacco Social History   Tobacco Use  Smoking Status Former Smoker  . Packs/day: 1.50  . Years: 12.00  . Pack years: 18.00  . Types: Cigarettes  . Last attempt to quit: 08/24/1971  . Years since quitting: 46.0  Smokeless Tobacco Never Used     Counseling given: Not Answered   Clinical Intake:  Pre-visit preparation completed: Yes  Pain : No/denies pain Pain Score: 0-No pain     Nutritional Status: BMI > 30  Obese Nutritional Risks: None Diabetes: No  How often do you need to have someone help you when you read instructions, pamphlets, or other written materials from your doctor or pharmacy?: 1 -  Never  Interpreter Needed?: No  Information entered by :: [facility], LPN  Past Medical History:  Diagnosis Date  . Arteriosclerosis of coronary artery 08/26/2011   Overview:      a.  1999 PCI of the mid LAD with stent.      b.  2002 Cath [facility]: EF 56%. RCA-distal 25/25%. Left main-50% ostial.  Left circumflex-25% OM2.  LAD-25% proximal.  75% mid.  25/25% distal. 75% D1.      c.  07/2011 PCI of RCA with DES.[place]   . Bleeding gastrointestinal 08/26/2011   Overview:      a.  Chronic abdominal pain, present improving.      b.  Duodenitis and gastritis by EGD in 2000.      c.  Pylori in 2000, treated.      d.  Gastroesophageal reflux disease.      e.  Diverticular disease.    . BP (high blood pressure) 08/26/2011  . Cancer ([place]) 2015   Lymphoma  . GERD (gastroesophageal reflux disease)   . Heart disease   . Helicobacter pylori infection 06/18/2015   by EGD RX 08/18/1999   . History of adenomatous polyp of colon 06/18/2015  . HOH (hard of hearing)    Bilateral Hearing  Aids   Past Surgical History:  Procedure Laterality Date  . ANGIOPLASTY    . CATARACT EXTRACTION    . COLONOSCOPY  2016  . CORONARY ANGIOPLASTY    .  EYE SURGERY Bilateral    Cataract Extraction with IOL  . GREEN LIGHT LASER TURP (TRANSURETHRAL RESECTION OF PROSTATE N/A 04/16/2015   Procedure: GREEN LIGHT LASER TURP (TRANSURETHRAL RESECTION OF PROSTATE WITH BLADDER BIOPSY;  Surgeon: Collier Flowers, MD;  Location: [facility] ORS;  Service: Urology;  Laterality: N/A;  . heart stent placement     1998, 2000, 2012  . TRANSURETHRAL RESECTION OF PROSTATE N/A 03/02/2017   Procedure: TRANSURETHRAL RESECTION OF THE PROSTATE (TURP);  Surgeon: Hollice Espy, MD;  Location: [facility] ORS;  Service: Urology;  Laterality: N/A;   Family History  Problem Relation Age of Onset  . Osteoporosis Mother   . Alzheimer's disease Father   . Kidney disease Neg Hx   . Prostate cancer Neg Hx   . Bladder Cancer Neg Hx   . Kidney cancer Neg Hx     Social History   Socioeconomic History  . Marital status: Widowed    Spouse name: None  . Number of children: 0  . Years of education: None  . Highest education level: 12th grade  Social Needs  . Financial resource strain: Not very hard  . Food insecurity - worry: Never true  . Food insecurity - inability: Never true  . Transportation needs - medical: No  . Transportation needs - non-medical: No  Occupational History  . Occupation: retired  Tobacco Use  . Smoking status: Former Smoker    Packs/day: 1.50    Years: 12.00    Pack years: 18.00    Types: Cigarettes    Last attempt to quit: 08/24/1971    Years since quitting: 46.0  . Smokeless tobacco: Never Used  Substance and Sexual Activity  . Alcohol use: No    Alcohol/week: 0.0 oz  . Drug use: No  . Sexual activity: None  Other Topics Concern  . None  Social History Narrative  . None    Outpatient Encounter Medications as of 09/07/2017  Medication Sig  . aspirin 81 MG tablet Take 81 mg by mouth daily.  Marland Kitchen atorvastatin (LIPITOR) 10 MG tablet Take 1 tablet (10 mg total) by mouth daily.  . Cholecalciferol (VITAMIN D3) 1000 units CAPS Take 1,000 Units by mouth daily.  . clopidogrel (PLAVIX) 75 MG tablet Take 1 tablet (75 mg total) by mouth daily.  Marland Kitchen docusate sodium (COLACE) 100 MG capsule Take 1 capsule (100 mg total) by mouth 2 (two) times daily.  . finasteride (PROSCAR) 5 MG tablet Take 1 tablet (5 mg total) by mouth daily.  . hydrochlorothiazide (HYDRODIURIL) 25 MG tablet TAKE ONE TABLET BY MOUTH ONCE DAILY  . isosorbide dinitrate (ISORDIL) 30 MG tablet Take 1 tablet (30 mg total) by mouth every morning.  . Magnesium 250 MG TABS Take 250 mg by mouth daily.  . metoprolol succinate (TOPROL-XL) 50 MG 24 hr tablet TAKE 1/2 (ONE-HALF) TABLET BY MOUTH ONCE DAILY. TAKE IN PLACE OF METOPROLOL TARTRATE 100 MG.  . Mirabegron (MYRBETRIQ PO) Take by mouth.  . Multiple Vitamins-Minerals (MULTIVITAMIN ADULT PO) Take by mouth daily.   . Multiple Vitamins-Minerals (VISION FORMULA 2 PO) Take by mouth daily.  Marland Kitchen omeprazole (PRILOSEC) 40 MG capsule Take 1 capsule (40 mg total) by mouth 2 (two) times daily.  . psyllium (METAMUCIL) 58.6 % packet Take 1 packet by mouth daily.  . ramipril (ALTACE) 2.5 MG capsule Take 1 capsule (2.5 mg total) by mouth daily.  . ranitidine (ZANTAC) 150 MG tablet Take 1 tablet (150 mg total) by mouth 2 (two) times daily. Reported on  03/10/2016  . sucralfate (CARAFATE) 1 g tablet Take 1 tablet (1 g total) by mouth 2 (two) times daily.  . tamsulosin (FLOMAX) 0.4 MG CAPS capsule Take 1 capsule (0.4 mg total) by mouth daily.  Marland Kitchen zolpidem (AMBIEN) 5 MG tablet TAKE 1 TABLET BY MOUTH AT BEDTIME AS NEEDED FOR SLEEP  . nitroGLYCERIN (NITROSTAT) 0.4 MG SL tablet Place 0.4 mg under the tongue every 5 (five) minutes as needed for chest pain. Reported on 03/10/2016  . [DISCONTINUED] ciprofloxacin (CIPRO) 500 MG tablet Take 1 tablet (500 mg total) every 12 (twelve) hours by mouth. (Patient not taking: Reported on 08/05/2017)  . [DISCONTINUED] temazepam (RESTORIL) 30 MG capsule take 1 capsule by mouth at bedtime if needed   Facility-Administered Encounter Medications as of 09/07/2017  Medication  . lidocaine (XYLOCAINE) 2 % jelly 1 application    Activities of Daily Living In your present state of health, do you have any difficulty performing the following activities: 09/07/2017 03/02/2017  Hearing? Tempie Donning  Comment wears bilateral hearing aids -  Vision? N N  Difficulty concentrating or making decisions? N N  Walking or climbing stairs? N N  Dressing or bathing? N N  Doing errands, shopping? N N  Preparing Food and eating ? N -  Using the Toilet? N -  In the past six months, have you accidently leaked urine? N -  Do you have problems with loss of bowel control? N -  Managing your Medications? N -  Managing your Finances? N -  Housekeeping or managing your Housekeeping? N -  Some recent data might be hidden     Patient Care Team: [name], MD as PCP - General (Family Medicine) Dingeldein, Remo Lipps, MD as Consulting Physician (Ophthalmology) Lloyd Huger, MD as Consulting Physician (Oncology) Hollice Espy, MD as Consulting Physician (Urology) Yolonda Kida, MD as Consulting Physician (Cardiology)   Assessment:   This is a routine wellness examination for Mclean.  Exercise Activities and Dietary recommendations Current Exercise Habits: Home exercise routine, Type of exercise: walking, Time (Minutes): 40, Frequency (Times/Week): 6, Weekly Exercise (Minutes/Week): 240, Intensity: Mild  Goals    . DIET - INCREASE WATER INTAKE     Recommend increasing water intake to 6-8 glasses a day.        Fall Risk Fall Risk  09/07/2017 12/08/2016 08/25/2016 06/18/2015  Falls in the past year? Yes Yes No No  Number falls in past yr: 1 1 - -  Injury with Fall? Yes Yes - -  Comment broke right ankle on 09/11/16 broken right ankle - -  Follow up Falls prevention discussed Falls prevention discussed - -   Is the patient's home free of loose throw rugs in walkways, pet beds, electrical cords, etc?   yes      Grab bars in the bathroom? yes      Handrails on the stairs?   yes      Adequate lighting?   yes  Timed Get Up and Go Performed: N/A  Depression Screen PHQ 2/9 Scores 09/07/2017 12/08/2016 12/08/2016 08/25/2016  PHQ - 2 Score 0 0 0 1  PHQ- 9 Score - 0 - -    Cognitive Function     6CIT Screen 12/08/2016  What Year? 0 points  What month? 0 points  What time? 0 points  Count back from 20 0 points  Months in reverse 0 points  Repeat phrase 2 points  Total Score 2    Immunization History  Administered Date(s)  Administered  . Influenza, High Dose Seasonal PF 06/18/2015, 06/01/2016, 05/12/2017  . Tdap 09/11/2016    Qualifies for Shingles Vaccine?  Due for Shingles vaccine. Declined my offer to administer today. Education has been provided regarding the importance of this  vaccine. Pt has been advised to call her insurance company to determine her out of pocket expense. Advised she may also receive this vaccine at her local pharmacy or Health Dept. Verbalized acceptance and understanding.   Screening Tests Health Maintenance  Topic Date Due  . PNA vac Low Risk Adult (1 of 2 - PCV13) 08/23/2026 (Originally 11/19/1998)  . TETANUS/TDAP  09/11/2026  . INFLUENZA VACCINE  Completed   Cancer Screenings: Lung: Low Dose CT Chest recommended if Age 2-80 years, 30 pack-year currently smoking OR have quit w/in 15years. Patient does not qualify. Colorectal: Up to date  Additional Screenings:  Hepatitis B/HIV/Syphillis: Pt declines today. Hepatitis C Screening: Pt declines today.     Plan:  I have personally reviewed and addressed the Medicare Annual Wellness questionnaire and have noted the following in the patient's chart:  A. Medical and social history B. Use of alcohol, tobacco or illicit drugs  C. Current medications and supplements D. Functional ability and status E.  Nutritional status F.  Physical activity G. Advance directives H. List of other physicians I.  Hospitalizations, surgeries, and ER visits in previous 12 months J.  [place] such as hearing and vision if needed, cognitive and depression L. Referrals and appointments - none  In addition, I have reviewed and discussed with patient certain preventive protocols, quality metrics, and best practice recommendations. A written personalized care plan for preventive services as well as general preventive health recommendations were provided to patient.  See attached scanned questionnaire for additional information.   Signed,  Fabio Neighbors, LPN Nurse Health Advisor   Nurse Recommendations: Records release form signed. Will get immunization records from [place] to update health maintanance. Pt thinks he has had the Pneumonia vaccines in the past.

## 2017-09-12 DIAGNOSIS — R339 Retention of urine, unspecified: Secondary | ICD-10-CM | POA: Diagnosis not present

## 2017-09-13 ENCOUNTER — Encounter: Payer: Self-pay | Admitting: Family Medicine

## 2017-09-13 ENCOUNTER — Ambulatory Visit (INDEPENDENT_AMBULATORY_CARE_PROVIDER_SITE_OTHER): Payer: PPO | Admitting: Family Medicine

## 2017-09-13 VITALS — BP 110/68 | HR 108 | Temp 97.9°F | Resp 16 | Ht 73.0 in | Wt 251.0 lb

## 2017-09-13 DIAGNOSIS — I10 Essential (primary) hypertension: Secondary | ICD-10-CM | POA: Diagnosis not present

## 2017-09-13 DIAGNOSIS — Z6833 Body mass index (BMI) 33.0-33.9, adult: Secondary | ICD-10-CM | POA: Diagnosis not present

## 2017-09-13 DIAGNOSIS — R42 Dizziness and giddiness: Secondary | ICD-10-CM | POA: Diagnosis not present

## 2017-09-13 DIAGNOSIS — Z0000 Encounter for general adult medical examination without abnormal findings: Secondary | ICD-10-CM | POA: Diagnosis not present

## 2017-09-13 MED ORDER — BENAZEPRIL-HYDROCHLOROTHIAZIDE 5-6.25 MG PO TABS: 1.0000 | ORAL_TABLET | Freq: Every day | ORAL | 1 refills | Status: DC

## 2017-09-13 NOTE — Patient Instructions (Addendum)
   The CDC recommends two doses of Shingrix (the shingles vaccine) separated by 2 to 6 months for adults age 82 years and older. I recommend checking with your insurance plan regarding coverage for this vaccine.    PLEASE BRING ALL OF YOUR MEDICATION BOTTLES TO EVERY APPOINTMENT TO MAKE SURE OUR MEDICATION LIST IS THE SAME AS YOURS  Medications Discontinued During This Encounter  Medication Reason  . hydrochlorothiazide (HYDRODIURIL) 25 MG tablet   . ramipril (ALTACE) 2.5 MG capsule     Meds ordered this encounter  Medications  . benazepril-hydrochlorthiazide (LOTENSIN HCT) 5-6.25 MG tablet    Sig: Take 1 tablet by mouth daily.    Dispense:  30 tablet    Refill:  1

## 2017-09-13 NOTE — Progress Notes (Signed)
Patient: Bryan Nelson, Male    DOB: Dec 17, 1931, 84 y.o.   MRN: [id number] Visit Date: 09/13/2017  Today's Provider: Lelon Huh, MD   Chief Complaint  Patient presents with  . Annual Exam   Subjective:   Patient saw Bryan Nelson 09/07/2017 for AVW.   Complete Physical Bryan Nelson is a 84 y.o. male. He feels well. He reports exercising yes/walking. He reports he is sleeping poorly.  -----------------------------------------------------------   Hypertension, follow-up:  BP Readings from Last 3 Encounters:  09/13/17 110/68  09/07/17 138/72  08/05/17 100/60    He was last seen for hypertension 5 months ago.  BP at that visit was 110/60. Management since that visit includes; changed from metopr.He reports good compliance with treatment. He is not having side effects. none He is exercising. He is adherent to low salt diet.   Outside blood pressures are 135/61. He is experiencing none.  Patient denies none.   Cardiovascular risk factors include advanced age (older than 32 for men, 40 for women).  Use of agents associated with hypertension: none.   He states that over the last couple of months he has had several episodes of feeling light headed when he stands up quickly.  ----------------------------------------------------------------    Lipid/Cholesterol, Follow-up:   Last seen for this 9 months ago.  Management since that visit includes; no changes .  Last Lipid Panel:    Component Value Date/Time   CHOL 147 11/22/2016   TRIG 196 (A) 11/22/2016   HDL 39 11/22/2016   LDLCALC 69 11/22/2016    He reports good compliance with treatment. He is not having side effects. non  Wt Readings from Last 3 Encounters:  09/13/17 251 lb (113.9 kg)  09/07/17 255 lb 9.6 oz (115.9 kg)  08/05/17 250 lb (113.4 kg)    ----------------------------------------------------------------  Arteriosclerosis of coronary artery From 12/08/2016-no changes. Continues regular  follow up at [place]. He denies any chest pain or heart flutters lately.    Review of Systems  Neurological: Positive for light-headedness.  All other systems reviewed and are negative.   Social History   Socioeconomic History  . Marital status: Widowed    Spouse name: Not on file  . Number of children: 0  . Years of education: Not on file  . Highest education level: 12th grade  Social Needs  . Financial resource strain: Not very hard  . Food insecurity - worry: Never true  . Food insecurity - inability: Never true  . Transportation needs - medical: No  . Transportation needs - non-medical: No  Occupational History  . Occupation: retired  Tobacco Use  . Smoking status: Former Smoker    Packs/day: 1.50    Years: 12.00    Pack years: 18.00    Types: Cigarettes    Last attempt to quit: 08/24/1971    Years since quitting: 46.0  . Smokeless tobacco: Never Used  Substance and Sexual Activity  . Alcohol use: No    Alcohol/week: 0.0 oz  . Drug use: No  . Sexual activity: Not on file  Other Topics Concern  . Not on file  Social History Narrative  . Not on file    Past Medical History:  Diagnosis Date  . Arteriosclerosis of coronary artery 08/26/2011   Overview:      a.  1999 PCI of the mid LAD with stent.      b.  2002 Cath [place]: EF 56%. RCA-distal 25/25%. Left main-50% ostial.  Left circumflex-25%  OM2.  LAD-25% proximal.  75% mid.  25/25% distal. 75% D1.      c.  07/2011 PCI of RCA with DES.[place]   . Bleeding gastrointestinal 08/26/2011   Overview:      a.  Chronic abdominal pain, present improving.      b.  Duodenitis and gastritis by EGD in 2000.      c.  Pylori in 2000, treated.      d.  Gastroesophageal reflux disease.      e.  Diverticular disease.    . BP (high blood pressure) 08/26/2011  . Cancer ([place]) 2015   Lymphoma  . GERD (gastroesophageal reflux disease)   . Heart disease   . Helicobacter pylori infection 06/18/2015   by EGD RX 08/18/1999   . History of  adenomatous polyp of colon 06/18/2015  . HOH (hard of hearing)    Bilateral Hearing  Aids     Patient Active Problem List   Diagnosis Date Noted  . Labile blood pressure 03/25/2017  . BPH (benign prostatic hyperplasia) 03/02/2017  . Malleolar fracture (Right) 09/10/2016  . Closed dislocation of right ankle 09/10/2016  . Recurrent UTI 12/21/2015  . Urinary retention 09/24/2015  . Narrowing of intervertebral disc space 06/18/2015  . Diverticulitis 06/18/2015  . Esophageal reflux 06/18/2015  . Familial multiple lipoprotein-type hyperlipidemia 06/18/2015  . Insomnia 06/18/2015  . Vitamin D deficiency 06/18/2015  . DLBCL (diffuse large B cell lymphoma) ([place]) 04/10/2014  . Benign prostatic hyperplasia with urinary obstruction 08/03/2012  . ED (erectile dysfunction) of organic origin 08/03/2012  . Incomplete bladder emptying 08/03/2012  . Breath shortness 12/09/2011  . Benign prostatic hypertrophy without urinary obstruction 08/26/2011  . Arteriosclerosis of coronary artery 08/26/2011  . Bleeding gastrointestinal 08/26/2011  . HLD (hyperlipidemia) 08/26/2011  . BP (high blood pressure) 08/26/2011  . Disorder of peripheral nervous system 08/26/2011  . Enlarged prostate 08/26/2011  . Peripheral nerve disease 08/26/2011  . Current tobacco use 08/26/2011    Past Surgical History:  Procedure Laterality Date  . ANGIOPLASTY    . CATARACT EXTRACTION    . COLONOSCOPY  2016  . CORONARY ANGIOPLASTY    . EYE SURGERY Bilateral    Cataract Extraction with IOL  . GREEN LIGHT LASER TURP (TRANSURETHRAL RESECTION OF PROSTATE N/A 04/16/2015   Procedure: GREEN LIGHT LASER TURP (TRANSURETHRAL RESECTION OF PROSTATE WITH BLADDER BIOPSY;  Surgeon: Collier Flowers, MD;  Location: [facility] ORS;  Service: Urology;  Laterality: N/A;  . heart stent placement     1998, 2000, 2012  . TRANSURETHRAL RESECTION OF PROSTATE N/A 03/02/2017   Procedure: TRANSURETHRAL RESECTION OF THE PROSTATE (TURP);  Surgeon: Hollice Espy, MD;  Location: [facility] ORS;  Service: Urology;  Laterality: N/A;    His family history includes Alzheimer's disease in his father; Osteoporosis in his mother.      Current Outpatient Medications:  .  aspirin 81 MG tablet, Take 81 mg by mouth daily., Disp: , Rfl:  .  atorvastatin (LIPITOR) 10 MG tablet, Take 1 tablet (10 mg total) by mouth daily., Disp: 90 tablet, Rfl: 4 .  Cholecalciferol (VITAMIN D3) 1000 units CAPS, Take 1,000 Units by mouth daily., Disp: , Rfl:  .  clopidogrel (PLAVIX) 75 MG tablet, Take 1 tablet (75 mg total) by mouth daily., Disp: 90 tablet, Rfl: 4 .  docusate sodium (COLACE) 100 MG capsule, Take 1 capsule (100 mg total) by mouth 2 (two) times daily., Disp: 60 capsule, Rfl: 0 .  finasteride (PROSCAR) 5 MG tablet,  Take 1 tablet (5 mg total) by mouth daily., Disp: 90 tablet, Rfl: 3 .  hydrochlorothiazide (HYDRODIURIL) 25 MG tablet, TAKE ONE TABLET BY MOUTH ONCE DAILY, Disp: 90 tablet, Rfl: 4 .  isosorbide dinitrate (ISORDIL) 30 MG tablet, Take 1 tablet (30 mg total) by mouth every morning., Disp: 90 tablet, Rfl: 4 .  Magnesium 250 MG TABS, Take 250 mg by mouth daily., Disp: , Rfl:  .  metoprolol succinate (TOPROL-XL) 50 MG 24 hr tablet, TAKE 1/2 (ONE-HALF) TABLET BY MOUTH ONCE DAILY. TAKE IN PLACE OF METOPROLOL TARTRATE 100 MG., Disp: 45 tablet, Rfl: 4 .  Mirabegron (MYRBETRIQ PO), Take by mouth., Disp: , Rfl:  .  Multiple Vitamins-Minerals (MULTIVITAMIN ADULT PO), Take by mouth daily., Disp: , Rfl:  .  Multiple Vitamins-Minerals (VISION FORMULA 2 PO), Take by mouth daily., Disp: , Rfl:  .  nitroGLYCERIN (NITROSTAT) 0.4 MG SL tablet, Place 0.4 mg under the tongue every 5 (five) minutes as needed for chest pain. Reported on 03/10/2016, Disp: , Rfl:  .  omeprazole (PRILOSEC) 40 MG capsule, Take 1 capsule (40 mg total) by mouth 2 (two) times daily., Disp: 180 capsule, Rfl: 4 .  psyllium (METAMUCIL) 58.6 % packet, Take 1 packet by mouth daily., Disp: , Rfl:  .  ramipril  (ALTACE) 2.5 MG capsule, Take 1 capsule (2.5 mg total) by mouth daily., Disp: 90 capsule, Rfl: 4 .  ranitidine (ZANTAC) 150 MG tablet, Take 1 tablet (150 mg total) by mouth 2 (two) times daily. Reported on 03/10/2016, Disp: 180 tablet, Rfl: 4 .  sucralfate (CARAFATE) 1 g tablet, Take 1 tablet (1 g total) by mouth 2 (two) times daily., Disp: 60 tablet, Rfl: 4 .  tamsulosin (FLOMAX) 0.4 MG CAPS capsule, Take 1 capsule (0.4 mg total) by mouth daily., Disp: 90 capsule, Rfl: 3 .  zolpidem (AMBIEN) 5 MG tablet, TAKE 1 TABLET BY MOUTH AT BEDTIME AS NEEDED FOR SLEEP, Disp: 30 tablet, Rfl: 4  Current Facility-Administered Medications:  .  lidocaine (XYLOCAINE) 2 % jelly 1 application, 1 application, Urethral, Once, Collier Flowers, MD  Patient Care Team: [name], MD as PCP - General (Family Medicine) Dingeldein, Remo Lipps, MD as Consulting Physician (Ophthalmology) Lloyd Huger, MD as Consulting Physician (Oncology) Hollice Espy, MD as Consulting Physician (Urology) Yolonda Kida, MD as Consulting Physician (Cardiology)     Objective:   Vitals: BP 110/68 (BP Location: Right Arm, Patient Position: Sitting, Cuff Size: Large)   Pulse (!) 108   Temp 97.9 F (36.6 C) (Oral)   Resp 16   Ht 6\' 1"  (1.854 m)   Wt 251 lb (113.9 kg)   SpO2 96%   BMI 33.12 kg/m   Physical Exam   General Appearance:    Alert, cooperative, no distress, appears stated age  Head:    Normocephalic, without obvious abnormality, atraumatic  Eyes:    PERRL, conjunctiva/corneas clear, EOM's intact, fundi    benign, both eyes       Ears:    Normal TM's and external ear canals, both ears  Nose:   Nares normal, septum midline, mucosa normal, no drainage   or sinus tenderness  Throat:   Lips, mucosa, and tongue normal; teeth and gums normal  Neck:   Supple, symmetrical, trachea midline, no adenopathy;       thyroid:  No enlargement/tenderness/nodules; no carotid   bruit or JVD  Back:     Symmetric, no  curvature, ROM normal, no CVA tenderness  Lungs:  Clear to auscultation bilaterally, respirations unlabored  Chest wall:    No tenderness or deformity  Heart:    Regular rate and rhythm, S1 and S2 normal, no murmur, rub   or gallop  Abdomen:     Soft, non-tender, bowel sounds active all four quadrants,    no masses, no organomegaly  Genitalia:    deferred  Rectal:    deferred  Extremities:   Extremities normal, atraumatic, no cyanosis or edema  Pulses:   2+ and symmetric all extremities  Skin:   Skin color, texture, turgor normal, no rashes or lesions  Lymph nodes:   Cervical, supraclavicular, and axillary nodes normal  Neurologic:   CNII-XII intact. Normal strength, sensation and reflexes      throughout    Activities of Daily Living In your present state of health, do you have any difficulty performing the following activities: 09/07/2017 03/02/2017  Hearing? Tempie Donning  Comment wears bilateral hearing aids -  Vision? N N  Difficulty concentrating or making decisions? N N  Walking or climbing stairs? N N  Dressing or bathing? N N  Doing errands, shopping? N N  Preparing Food and eating ? N -  Using the Toilet? N -  In the past six months, have you accidently leaked urine? N -  Do you have problems with loss of bowel control? N -  Managing your Medications? N -  Managing your Finances? N -  Housekeeping or managing your Housekeeping? N -  Some recent data might be hidden    Fall Risk Assessment Fall Risk  09/07/2017 12/08/2016 08/25/2016 06/18/2015  Falls in the past year? Yes Yes No No  Number falls in past yr: 1 1 - -  Injury with Fall? Yes Yes - -  Comment broke right ankle on 09/11/16 broken right ankle - -  Follow up Falls prevention discussed Falls prevention discussed - -     Depression Screen PHQ 2/9 Scores 09/07/2017 12/08/2016 12/08/2016 08/25/2016  PHQ - 2 Score 0 0 0 1  PHQ- 9 Score - 0 - -        Assessment & Plan:    Annual Physical Reviewed patient's Family  Medical History Reviewed and updated list of patient's medical providers Assessment of cognitive impairment was done Assessed patient's functional ability Established a written schedule for health screening [place] Completed and Reviewed  Exercise Activities and Dietary recommendations Goals    . DIET - INCREASE WATER INTAKE     Recommend increasing water intake to 6-8 glasses a day.        Immunization History  Administered Date(s) Administered  . Influenza, High Dose Seasonal PF 06/18/2015, 06/01/2016, 05/12/2017  . Tdap 09/11/2016    Health Maintenance  Topic Date Due  . PNA vac Low Risk Adult (1 of 2 - PCV13) 08/23/2026 (Originally 11/19/1998)  . TETANUS/TDAP  09/11/2026  . INFLUENZA VACCINE  Completed     Discussed health benefits of physical activity, and encouraged him to engage in regular exercise appropriate for his age and condition.    --------------------------------------------------------------------------  1. Annual physical exam   2. BMI 33.0-33.9,adult   3. Dizziness Likely orthostasis secondary to polypharmacy. He would like to reduce the number of medications he is taking, will reduce hctz and combine with benazepril (in place of ramipril0 as below.   4. Essential hypertension Is a bit hypotensive and having some orthostatic symptoms. Discussed several of his medications that can cause light headedness. Will change ramipril and  hctz 25 to benazepril 5-6.25mg  daily. Follow up 1 month for BP check. Patient to discuss tamsulosin with urology at next follow up.     Lelon Huh, MD  [facility]

## 2017-09-22 ENCOUNTER — Telehealth: Payer: Self-pay | Admitting: Family Medicine

## 2017-09-22 NOTE — Telephone Encounter (Signed)
Bryan Nelson with [facility] stated she was returning a call. Bryan Nelson stated the person didn't leave their name on the voice message but they were checking on a medical record request. Please advise. Thanks TNP  CB# [phone]

## 2017-09-22 NOTE — Telephone Encounter (Signed)
Called above number and its said the extension was unavailable. Spoke with operator who stated the ext did not work for her and she would put out a message to have this caller call me back. LM on medical records dept requesting a CB (on my ext) to discuss this. Records release form faxed 09/07/17.  -MM

## 2017-09-28 NOTE — Telephone Encounter (Signed)
Spoke with Royster from the [place] and he states there is no medical release found on file for pt. He requested to re-fax release form to [phone]. EXT # for medical release department is [phone]. Re-faxed form.  -MM

## 2017-10-06 NOTE — Telephone Encounter (Signed)
Received an additional authorization form to be completed by facility and pt to receive records. Unsure which [facility] facility pt was seen at. LMTCB @ [phone] ext [phone].

## 2017-10-10 DIAGNOSIS — R339 Retention of urine, unspecified: Secondary | ICD-10-CM | POA: Diagnosis not present

## 2017-10-12 ENCOUNTER — Ambulatory Visit (INDEPENDENT_AMBULATORY_CARE_PROVIDER_SITE_OTHER): Payer: PPO | Admitting: Family Medicine

## 2017-10-12 ENCOUNTER — Encounter: Payer: Self-pay | Admitting: Family Medicine

## 2017-10-12 VITALS — BP 132/68 | HR 72 | Temp 98.3°F | Resp 16 | Ht 73.0 in | Wt 251.0 lb

## 2017-10-12 DIAGNOSIS — R200 Anesthesia of skin: Secondary | ICD-10-CM

## 2017-10-12 DIAGNOSIS — R350 Frequency of micturition: Secondary | ICD-10-CM

## 2017-10-12 DIAGNOSIS — I10 Essential (primary) hypertension: Secondary | ICD-10-CM | POA: Diagnosis not present

## 2017-10-12 DIAGNOSIS — G459 Transient cerebral ischemic attack, unspecified: Secondary | ICD-10-CM

## 2017-10-12 DIAGNOSIS — R42 Dizziness and giddiness: Secondary | ICD-10-CM | POA: Diagnosis not present

## 2017-10-12 MED ORDER — BENAZEPRIL-HYDROCHLOROTHIAZIDE 5-6.25 MG PO TABS: 1.0000 | ORAL_TABLET | Freq: Every day | ORAL | 3 refills | Status: DC

## 2017-10-12 MED ORDER — SUCRALFATE 1 G PO TABS: 1.0000 g | ORAL_TABLET | Freq: Two times a day (BID) | ORAL | 1 refills | Status: DC | PRN

## 2017-10-12 NOTE — Progress Notes (Signed)
Patient: Bryan Nelson Male    DOB: Aug 07, 1932   84 y.o.   MRN: [id number] Visit Date: 10/12/2017  Today's Provider: Lelon Huh, MD   Chief Complaint  Patient presents with  . Follow-up  . Hypertension   Subjective:    HPI   Hypertension, follow-up:  BP Readings from Last 3 Encounters:  10/12/17 132/68  09/13/17 110/68  09/07/17 138/72    He was last seen for hypertension 1 months ago.  BP at that visit was 110/68. Management since that visit includes; changed ramipril and hctz 25 to benazepril 5-6.25mg  daily due to dizziness. He reports good compliance with treatment. Home Bps have been 130s/60s. States that dizziness has not really changed He is adherent to low salt diet.   Patient denies chest pressure/discomfort, fatigue and lower extremity edema.     He also states that on at least two occasions he has had sudden onset of numbness in his left arm followed by generalized weakness that made him feel 'just terrible' Each episode lasted a minute or two and resolved. Had no chest pain, pressure, or dyspnea.    Allergies  Allergen Reactions  . Ciprofloxacin Diarrhea  . Lisinopril Other (See Comments)    Unknown   . Penicillins Swelling    unknwon reaction.  tolerates amoxicillin Has patient had a PCN reaction causing immediate rash, facial/tongue/throat swelling, SOB or lightheadedness with hypotension: No Has patient had a PCN reaction causing severe rash involving mucus membranes or skin necrosis: No Has patient had a PCN reaction that required hospitalization: No Has patient had a PCN reaction occurring within the last 10 years: Yes If all of the above answers are "NO", then may proceed with Cephalosporin use.   . Sulfa Antibiotics Itching and Rash     Current Outpatient Medications:  .  aspirin 81 MG tablet, Take 81 mg by mouth daily., Disp: , Rfl:  .  atorvastatin (LIPITOR) 10 MG tablet, Take 1 tablet (10 mg total) by mouth daily., Disp: 90  tablet, Rfl: 4 .  benazepril-hydrochlorthiazide (LOTENSIN HCT) 5-6.25 MG tablet, Take 1 tablet by mouth daily., Disp: 30 tablet, Rfl: 1 .  Cholecalciferol (VITAMIN D3) 1000 units CAPS, Take 1,000 Units by mouth daily., Disp: , Rfl:  .  clopidogrel (PLAVIX) 75 MG tablet, Take 1 tablet (75 mg total) by mouth daily., Disp: 90 tablet, Rfl: 4 .  docusate sodium (COLACE) 100 MG capsule, Take 1 capsule (100 mg total) by mouth 2 (two) times daily., Disp: 60 capsule, Rfl: 0 .  finasteride (PROSCAR) 5 MG tablet, Take 1 tablet (5 mg total) by mouth daily., Disp: 90 tablet, Rfl: 3 .  isosorbide dinitrate (ISORDIL) 30 MG tablet, Take 1 tablet (30 mg total) by mouth every morning., Disp: 90 tablet, Rfl: 4 .  Magnesium 250 MG TABS, Take 250 mg by mouth daily., Disp: , Rfl:  .  metoprolol succinate (TOPROL-XL) 50 MG 24 hr tablet, TAKE 1/2 (ONE-HALF) TABLET BY MOUTH ONCE DAILY. TAKE IN PLACE OF METOPROLOL TARTRATE 100 MG., Disp: 45 tablet, Rfl: 4 .  Mirabegron (MYRBETRIQ PO), Take by mouth., Disp: , Rfl:  .  Multiple Vitamins-Minerals (MULTIVITAMIN ADULT PO), Take by mouth daily., Disp: , Rfl:  .  Multiple Vitamins-Minerals (VISION FORMULA 2 PO), Take by mouth daily., Disp: , Rfl:  .  nitroGLYCERIN (NITROSTAT) 0.4 MG SL tablet, Place 0.4 mg under the tongue every 5 (five) minutes as needed for chest pain. Reported on 03/10/2016, Disp: , Rfl:  .  omeprazole (PRILOSEC) 40 MG capsule, Take 1 capsule (40 mg total) by mouth 2 (two) times daily., Disp: 180 capsule, Rfl: 4 .  psyllium (METAMUCIL) 58.6 % packet, Take 1 packet by mouth daily., Disp: , Rfl:  .  ranitidine (ZANTAC) 150 MG tablet, Take 1 tablet (150 mg total) by mouth 2 (two) times daily. Reported on 03/10/2016, Disp: 180 tablet, Rfl: 4 .  sucralfate (CARAFATE) 1 g tablet, Take 1 tablet (1 g total) by mouth 2 (two) times daily as needed (heartburn or refllux)., Disp: 1 tablet, Rfl: 1 .  tamsulosin (FLOMAX) 0.4 MG CAPS capsule, Take 1 capsule (0.4 mg total) by  mouth daily., Disp: 90 capsule, Rfl: 3 .  zolpidem (AMBIEN) 5 MG tablet, TAKE 1 TABLET BY MOUTH AT BEDTIME AS NEEDED FOR SLEEP, Disp: 30 tablet, Rfl: 4  Current Facility-Administered Medications:  .  lidocaine (XYLOCAINE) 2 % jelly 1 application, 1 application, Urethral, Once, Collier Flowers, MD  Review of Systems  Constitutional: Negative for appetite change, chills and fever.  Respiratory: Negative for chest tightness, shortness of breath and wheezing.   Cardiovascular: Negative for chest pain and palpitations.  Gastrointestinal: Negative for abdominal pain, nausea and vomiting.    Social History   Tobacco Use  . Smoking status: Former Smoker    Packs/day: 1.50    Years: 12.00    Pack years: 18.00    Types: Cigarettes    Last attempt to quit: 08/24/1971    Years since quitting: 46.1  . Smokeless tobacco: Never Used  Substance Use Topics  . Alcohol use: No    Alcohol/week: 0.0 oz   Objective:   BP 132/68 (BP Location: Left Arm, Patient Position: Sitting, Cuff Size: Large)   Pulse 72   Temp 98.3 F (36.8 C)   Resp 16   Ht 6\' 1"  (1.854 m)   Wt 251 lb (113.9 kg)   BMI 33.12 kg/m  Vitals:   10/12/17 0811  BP: 132/68  Pulse: 72  Resp: 16  Temp: 98.3 F (36.8 C)  Weight: 251 lb (113.9 kg)  Height: 6\' 1"  (1.854 m)     Physical Exam  General appearance: alert, well developed, well nourished, cooperative and in no distress Head: Normocephalic, without obvious abnormality, atraumatic Respiratory: Respirations even and unlabored, normal respiratory rate Extremities: No gross deformities Skin: Skin color, texture, turgor normal. No rashes seen  Psych: Appropriate mood and affect. Neurologic: Mental status: Alert, oriented to person, place, and time, thought content appropriate.     Assessment & Plan:     1. Dizziness No better with change in blood pressure medications. Likely related to alpha blocker and isosorbide. Will put tamsulosin on hold for the now.   2.  Urinary frequency He is already self cathing as needed and discussed that side effects of tamsulosin likely outweigh benefits. Continue finasteride since enlarged prostate would make cathing more difficult.   3. Left arm numbness Concerning for TIA, is already on clopidogrel and statin. Not c/w cardiac disease, but if carotids normal may need early follow up with Dr.Callwood.   4. TIA (transient ischemic attack)  - US Carotid Duplex Bilateral; Future  5. Essential hypertension Continue current medications.    Return for BP check in 3 monts.        Lelon Huh, MD  [facility]

## 2017-10-12 NOTE — Patient Instructions (Signed)
   Stop taking tamsulosin to see if dizziness gets better

## 2017-10-18 ENCOUNTER — Ambulatory Visit
Admission: RE | Admit: 2017-10-18 | Discharge: 2017-10-18 | Disposition: A | Discharge status: Home / Self Care | Payer: PPO | Source: Ambulatory Visit | Attending: Family Medicine | Admitting: Family Medicine

## 2017-10-18 DIAGNOSIS — G459 Transient cerebral ischemic attack, unspecified: Secondary | ICD-10-CM | POA: Diagnosis not present

## 2017-10-18 DIAGNOSIS — I6523 Occlusion and stenosis of bilateral carotid arteries: Secondary | ICD-10-CM | POA: Diagnosis not present

## 2017-10-26 ENCOUNTER — Ambulatory Visit: Payer: PPO | Admitting: Urology

## 2017-10-26 ENCOUNTER — Encounter: Payer: Self-pay | Admitting: Urology

## 2017-10-26 VITALS — BP 155/89 | HR 88 | Resp 16 | Ht 73.0 in | Wt 250.0 lb

## 2017-10-26 DIAGNOSIS — N323 Diverticulum of bladder: Secondary | ICD-10-CM

## 2017-10-26 DIAGNOSIS — R350 Frequency of micturition: Secondary | ICD-10-CM

## 2017-10-26 DIAGNOSIS — N390 Urinary tract infection, site not specified: Secondary | ICD-10-CM | POA: Diagnosis not present

## 2017-10-26 DIAGNOSIS — N138 Other obstructive and reflux uropathy: Secondary | ICD-10-CM

## 2017-10-26 DIAGNOSIS — N401 Enlarged prostate with lower urinary tract symptoms: Secondary | ICD-10-CM | POA: Diagnosis not present

## 2017-10-26 LAB — MICROSCOPIC EXAMINATION: RBC, UA: NONE SEEN /hpf (ref 0–?)

## 2017-10-26 LAB — URINALYSIS, COMPLETE
BILIRUBIN UA: NEGATIVE
Glucose, UA: NEGATIVE
Ketones, UA: NEGATIVE
Nitrite, UA: POSITIVE — AB
PH UA: 6 (ref 5.0–7.5)
Protein, UA: NEGATIVE
RBC UA: NEGATIVE
Specific Gravity, UA: 1.01 (ref 1.005–1.030)
UUROB: 0.2 mg/dL (ref 0.2–1.0)

## 2017-10-26 MED ORDER — NITROFURANTOIN MONOHYD MACRO 100 MG PO CAPS: 100.0000 mg | ORAL_CAPSULE | Freq: Two times a day (BID) | ORAL | 0 refills | Status: DC

## 2017-10-26 NOTE — Progress Notes (Signed)
10/26/2017 2:04 PM   Bryan Nelson 07/25/1932 [id number]  Referring provider: [name], MD [address] [place], [name] [address]   HPI: 84 year old Caucasian male with a history of BPH associated with urinary retention who underwent a TURP on March 02, 2017 with Dr. Erlene Quan presents today with a complaint of frequent urination.  He states that last evening he experienced intense bladder pressure.  He cathed himself at 2 AM, 4 AM and 6 AM.  He voided in small amounts between the time of 7 AM and 10 AM.  He felt he was about to bust, but he had to use the restroom 4-5 times.  He also experiencing dysuria.  Patient denies any gross hematuria or suprapubic/flank pain.  Patient denies any fevers, chills, nausea or vomiting.   He is currently self cathing every 2-3 weeks.  He states he is using a new catheter every time.  His CATH UA was nitrite positive, positive for 6-10 WBC's and many bacteria.    PMH: Past Medical History:  Diagnosis Date  . Arteriosclerosis of coronary artery 08/26/2011   Overview:      a.  1999 PCI of the mid LAD with stent.      b.  2002 Cath [place]: EF 56%. RCA-distal 25/25%. Left main-50% ostial.  Left circumflex-25% OM2.  LAD-25% proximal.  75% mid.  25/25% distal. 75% D1.      c.  07/2011 PCI of RCA with DES.[place]   . Bleeding gastrointestinal 08/26/2011   Overview:      a.  Chronic abdominal pain, present improving.      b.  Duodenitis and gastritis by EGD in 2000.      c.  Pylori in 2000, treated.      d.  Gastroesophageal reflux disease.      e.  Diverticular disease.    . BP (high blood pressure) 08/26/2011  . Cancer ([place]) 2015   Lymphoma  . GERD (gastroesophageal reflux disease)   . Heart disease   . Helicobacter pylori infection 06/18/2015   by EGD RX 08/18/1999   . History of adenomatous polyp of colon 06/18/2015  . HOH (hard of hearing)    Bilateral Hearing  Aids    Surgical History: Past Surgical History:  Procedure  Laterality Date  . ANGIOPLASTY    . CATARACT EXTRACTION    . COLONOSCOPY  2016  . CORONARY ANGIOPLASTY    . EYE SURGERY Bilateral    Cataract Extraction with IOL  . GREEN LIGHT LASER TURP (TRANSURETHRAL RESECTION OF PROSTATE N/A 04/16/2015   Procedure: GREEN LIGHT LASER TURP (TRANSURETHRAL RESECTION OF PROSTATE WITH BLADDER BIOPSY;  Surgeon: Collier Flowers, MD;  Location: [facility] ORS;  Service: Urology;  Laterality: N/A;  . heart stent placement     1998, 2000, 2012  . TRANSURETHRAL RESECTION OF PROSTATE N/A 03/02/2017   Procedure: TRANSURETHRAL RESECTION OF THE PROSTATE (TURP);  Surgeon: Hollice Espy, MD;  Location: [facility] ORS;  Service: Urology;  Laterality: N/A;    Home Medications:  Allergies as of 10/26/2017      Reactions   Ciprofloxacin Diarrhea   Lisinopril Other (See Comments)   Unknown    Penicillins Swelling   unknwon reaction.  tolerates amoxicillin Has patient had a PCN reaction causing immediate rash, facial/tongue/throat swelling, SOB or lightheadedness with hypotension: No Has patient had a PCN reaction causing severe rash involving mucus membranes or skin necrosis: No Has patient had a PCN reaction that required hospitalization: No Has  patient had a PCN reaction occurring within the last 10 years: Yes If all of the above answers are "NO", then may proceed with Cephalosporin use.   Sulfa Antibiotics Itching, Rash      Medication List        Accurate as of 10/26/17  2:04 PM. Always use your most recent med list.          aspirin 81 MG tablet Take 81 mg by mouth daily.   atorvastatin 10 MG tablet Commonly known as:  LIPITOR Take 1 tablet (10 mg total) by mouth daily.   benazepril-hydrochlorthiazide 5-6.25 MG tablet Commonly known as:  LOTENSIN HCT Take 1 tablet by mouth daily.   clopidogrel 75 MG tablet Commonly known as:  PLAVIX Take 1 tablet (75 mg total) by mouth daily.   docusate sodium 100 MG capsule Commonly known as:  COLACE Take 1 capsule (100 mg  total) by mouth 2 (two) times daily.   finasteride 5 MG tablet Commonly known as:  PROSCAR Take 1 tablet (5 mg total) by mouth daily.   isosorbide dinitrate 30 MG tablet Commonly known as:  ISORDIL Take 1 tablet (30 mg total) by mouth every morning.   Magnesium 250 MG Tabs Take 250 mg by mouth daily.   metoprolol succinate 50 MG 24 hr tablet Commonly known as:  TOPROL-XL TAKE 1/2 (ONE-HALF) TABLET BY MOUTH ONCE DAILY. TAKE IN PLACE OF METOPROLOL TARTRATE 100 MG.   MYRBETRIQ PO Take by mouth.   nitroGLYCERIN 0.4 MG SL tablet Commonly known as:  NITROSTAT Place 0.4 mg under the tongue every 5 (five) minutes as needed for chest pain. Reported on 03/10/2016   omeprazole 40 MG capsule Commonly known as:  PRILOSEC Take 1 capsule (40 mg total) by mouth 2 (two) times daily.   psyllium 58.6 % packet Commonly known as:  METAMUCIL Take 1 packet by mouth daily.   ranitidine 150 MG tablet Commonly known as:  ZANTAC Take 1 tablet (150 mg total) by mouth 2 (two) times daily. Reported on 03/10/2016   sucralfate 1 g tablet Commonly known as:  CARAFATE Take 1 tablet (1 g total) by mouth 2 (two) times daily as needed (heartburn or refllux).   VISION FORMULA 2 PO Take by mouth daily.   MULTIVITAMIN ADULT PO Take by mouth daily.   Vitamin D3 1000 units Caps Take 1,000 Units by mouth daily.   zolpidem 5 MG tablet Commonly known as:  AMBIEN TAKE 1 TABLET BY MOUTH AT BEDTIME AS NEEDED FOR SLEEP       Allergies:  Allergies  Allergen Reactions  . Ciprofloxacin Diarrhea  . Lisinopril Other (See Comments)    Unknown   . Penicillins Swelling    unknwon reaction.  tolerates amoxicillin Has patient had a PCN reaction causing immediate rash, facial/tongue/throat swelling, SOB or lightheadedness with hypotension: No Has patient had a PCN reaction causing severe rash involving mucus membranes or skin necrosis: No Has patient had a PCN reaction that required hospitalization: No Has  patient had a PCN reaction occurring within the last 10 years: Yes If all of the above answers are "NO", then may proceed with Cephalosporin use.   . Sulfa Antibiotics Itching and Rash    Family History: Family History  Problem Relation Age of Onset  . Osteoporosis Mother   . Alzheimer's disease Father   . Kidney disease Neg Hx   . Prostate cancer Neg Hx   . Bladder Cancer Neg Hx   . Kidney cancer Neg Hx  Social History:  reports that he quit smoking about 46 years ago. His smoking use included cigarettes. He has a 18.00 pack-year smoking history. he has never used smokeless tobacco. He reports that he does not drink alcohol or use drugs.  ROS: UROLOGY Frequent Urination?: Yes Hard to postpone urination?: No Burning/pain with urination?: Yes Get up at night to urinate?: No Leakage of urine?: No Urine stream starts and stops?: No Trouble starting stream?: No Do you have to strain to urinate?: No Blood in urine?: No Urinary tract infection?: No Sexually transmitted disease?: No Injury to kidneys or bladder?: No Painful intercourse?: No Weak stream?: No Erection problems?: No Penile pain?: No  Gastrointestinal Nausea?: No Vomiting?: No Indigestion/heartburn?: No Diarrhea?: No Constipation?: No  Constitutional Fever: No Night sweats?: No Weight loss?: No Fatigue?: No  Skin Skin rash/lesions?: No Itching?: No  Eyes Blurred vision?: No Double vision?: No  Ears/Nose/Throat Sore throat?: No Sinus problems?: No  Hematologic/Lymphatic Swollen glands?: No Easy bruising?: No  Cardiovascular Leg swelling?: No Chest pain?: No  Respiratory Cough?: No Shortness of breath?: No  Endocrine Excessive thirst?: No  Musculoskeletal Back pain?: No Joint pain?: No  Neurological Headaches?: No Dizziness?: No  Psychologic Depression?: No Anxiety?: No  Physical Exam: BP (!) 155/89   Pulse 88   Resp 16   Ht 6\' 1"  (1.854 m)   Wt 250 lb (113.4 kg)    SpO2 98%   BMI 32.98 kg/m   Constitutional: Well nourished. Alert and oriented, No acute distress. HEENT: [place] AT, moist mucus membranes. Trachea midline, no masses. Cardiovascular: No clubbing, cyanosis, or edema. Respiratory: Normal respiratory effort, no increased work of breathing. GI: Abdomen is soft, non tender, non distended, no abdominal masses. Liver and spleen not palpable.  No hernias appreciated.  Stool sample for occult testing is not indicated.   GU: No CVA tenderness.  No bladder fullness or masses.   Skin: No rashes, bruises or suspicious lesions. Lymph: No cervical or inguinal adenopathy. Neurologic: Grossly intact, no focal deficits, moving all 4 extremities. Psychiatric: Normal mood and affect.   Laboratory Data: Lab Results  Component Value Date   WBC 5.4 02/18/2017   HGB 12.9 (L) 03/03/2017   HCT 38.3 (L) 03/03/2017   MCV 88.3 02/18/2017   PLT 170 02/18/2017    Lab Results  Component Value Date   CREATININE 1.19 03/03/2017    Lab Results  Component Value Date   PSA 0.81 11/22/2016   PSA 0.9 02/27/2014    Lab Results  Component Value Date   HGBA1C 5.9 11/22/2016    Urinalysis CATH UA today shows greater than 30 white blood cells, many bacteria, nitrate positive.  See Epic.   I have reviewed the labs.  Pertinent Imaging: No new imaging  Assessment & Plan:    1. UTI -CATH UA is suspicious for infection -will send for culture -will start on Macrobid empirically and adjust if necessary once urine culture and sensitivities are available   2. History of recurrent UTI Bacterial colonization related to clean intermittent catheterization Today, his UA suspicious for infection and he is symptomatic  3. Benign prostatic hyperplasia with urinary obstruction and frequency Continue Flomax and finasteride and Myrbetriq  -patient will cath himself once daily and record residuals and present in 1 week to review his log book  4. Bladder  diverticulum Appreciated on most recent CT scan, likely related to chronic outlet obstruction  Follow-up in one week to review PVR's  Zara Council, PA-C  [facility] [address] [place]  [place], [place] [address] [phone]

## 2017-10-31 ENCOUNTER — Telehealth: Payer: Self-pay

## 2017-10-31 LAB — CULTURE, URINE COMPREHENSIVE

## 2017-10-31 NOTE — Telephone Encounter (Signed)
Patient notified

## 2017-10-31 NOTE — Telephone Encounter (Signed)
-----   Message from Nori Riis, PA-C sent at 10/31/2017  8:16 AM EDT ----- Please let Mr. Fesperman know that his urine culture was positive and that the Macrobid was the appropriate antibiotic.

## 2017-11-01 ENCOUNTER — Telehealth: Payer: Self-pay | Admitting: Family Medicine

## 2017-11-01 DIAGNOSIS — R0989 Other specified symptoms and signs involving the circulatory and respiratory systems: Secondary | ICD-10-CM

## 2017-11-01 NOTE — Telephone Encounter (Addendum)
Patient stated that in the morning his bp is good at 129/73. In the evening his blood pressure goes up to avg of 155/75. Patient stated he takes metoprolol 50 mg 1/2 tablet in the mornings. Patient wanted to know if he needs an adjustment to his bp medication? Please advise?

## 2017-11-01 NOTE — Progress Notes (Signed)
11/02/2017 10:14 AM   Bryan Nelson March 06, 1932 [id number]  Referring provider: [name], MD [address] [place], [place] [address]   HPI: 84 year old Caucasian male with a history of BPH associated with urinary retention who underwent a TURP on March 02, 2017 with Dr. Erlene Quan presents today to review his PVR is and a symptom recheck with his nephew, [name].    At his visit on 10/26/2017, He stated that last evening he experienced intense bladder pressure.  He cathed himself at 2 AM, 4 AM and 6 AM.  He voided in small amounts between the time of 7 AM and 10 AM.  He felt he was about to bust, but he had to use the restroom 4-5 times.  He also experiencing dysuria.  Patient denies any gross hematuria or suprapubic/flank pain.  Patient denies any fevers, chills, nausea or vomiting.   He is currently self cathing every 2-3 weeks.  He states he is using a new catheter every time.  His CATH UA was nitrite positive, positive for 6-10 WBC's and many bacteria.  His urine culture was positive for Enterobacter cloacae complex with a variable resistance pattern which was sensitive to the [place].    Today, he states he feels well.  He is no longer experiencing bladder pressure or dysuria.  He still is having nocturia.  He did bring in his PVRs and he is cathing twice daily with residuals between 100-200 cc.  Patient denies any gross hematuria, dysuria or suprapubic/flank pain.  Patient denies any fevers, chills, nausea or vomiting.   His biggest complaint is nocturia x2-3.  He did not find Myrbetriq effective.     PMH: Past Medical History:  Diagnosis Date  . Arteriosclerosis of coronary artery 08/26/2011   Overview:      a.  1999 PCI of the mid LAD with stent.      b.  2002 Cath [place]: EF 56%. RCA-distal 25/25%. Left main-50% ostial.  Left circumflex-25% OM2.  LAD-25% proximal.  75% mid.  25/25% distal. 75% D1.      c.  07/2011 PCI of RCA with DES.[place]   . Bleeding  gastrointestinal 08/26/2011   Overview:      a.  Chronic abdominal pain, present improving.      b.  Duodenitis and gastritis by EGD in 2000.      c.  Pylori in 2000, treated.      d.  Gastroesophageal reflux disease.      e.  Diverticular disease.    . BP (high blood pressure) 08/26/2011  . Cancer ([place]) 2015   Lymphoma  . GERD (gastroesophageal reflux disease)   . Heart disease   . Helicobacter pylori infection 06/18/2015   by EGD RX 08/18/1999   . History of adenomatous polyp of colon 06/18/2015  . HOH (hard of hearing)    Bilateral Hearing  Aids    Surgical History: Past Surgical History:  Procedure Laterality Date  . ANGIOPLASTY    . CATARACT EXTRACTION    . COLONOSCOPY  2016  . CORONARY ANGIOPLASTY    . EYE SURGERY Bilateral    Cataract Extraction with IOL  . GREEN LIGHT LASER TURP (TRANSURETHRAL RESECTION OF PROSTATE N/A 04/16/2015   Procedure: GREEN LIGHT LASER TURP (TRANSURETHRAL RESECTION OF PROSTATE WITH BLADDER BIOPSY;  Surgeon: Collier Flowers, MD;  Location: [facility] ORS;  Service: Urology;  Laterality: N/A;  . heart stent placement     1998, 2000, 2012  . TRANSURETHRAL RESECTION  OF PROSTATE N/A 03/02/2017   Procedure: TRANSURETHRAL RESECTION OF THE PROSTATE (TURP);  Surgeon: Hollice Espy, MD;  Location: [facility] ORS;  Service: Urology;  Laterality: N/A;    Home Medications:  Allergies as of 11/02/2017      Reactions   Ciprofloxacin Diarrhea   Lisinopril Other (See Comments)   Unknown    Penicillins Swelling   unknwon reaction.  tolerates amoxicillin Has patient had a PCN reaction causing immediate rash, facial/tongue/throat swelling, SOB or lightheadedness with hypotension: No Has patient had a PCN reaction causing severe rash involving mucus membranes or skin necrosis: No Has patient had a PCN reaction that required hospitalization: No Has patient had a PCN reaction occurring within the last 10 years: Yes If all of the above answers are "NO", then may proceed with  Cephalosporin use.   Sulfa Antibiotics Itching, Rash      Medication List        Accurate as of 11/02/17 10:14 AM. Always use your most recent med list.          aspirin 81 MG tablet Take 81 mg by mouth daily.   atorvastatin 10 MG tablet Commonly known as:  LIPITOR Take 1 tablet (10 mg total) by mouth daily.   benazepril-hydrochlorthiazide 5-6.25 MG tablet Commonly known as:  LOTENSIN HCT Take 1 tablet by mouth daily.   clopidogrel 75 MG tablet Commonly known as:  PLAVIX Take 1 tablet (75 mg total) by mouth daily.   docusate sodium 100 MG capsule Commonly known as:  COLACE Take 1 capsule (100 mg total) by mouth 2 (two) times daily.   finasteride 5 MG tablet Commonly known as:  PROSCAR Take 1 tablet (5 mg total) by mouth daily.   isosorbide dinitrate 30 MG tablet Commonly known as:  ISORDIL Take 1 tablet (30 mg total) by mouth every morning.   Magnesium 250 MG Tabs Take 250 mg by mouth daily.   metoprolol succinate 50 MG 24 hr tablet Commonly known as:  TOPROL-XL TAKE 1/2 (ONE-HALF) TABLET BY MOUTH ONCE DAILY. TAKE IN PLACE OF METOPROLOL TARTRATE 100 MG.   MYRBETRIQ PO Take by mouth.   nitrofurantoin (macrocrystal-monohydrate) 100 MG capsule Commonly known as:  MACROBID Take 1 capsule (100 mg total) by mouth every 12 (twelve) hours.   nitroGLYCERIN 0.4 MG SL tablet Commonly known as:  NITROSTAT Place 0.4 mg under the tongue every 5 (five) minutes as needed for chest pain. Reported on 03/10/2016   omeprazole 40 MG capsule Commonly known as:  PRILOSEC Take 1 capsule (40 mg total) by mouth 2 (two) times daily.   psyllium 58.6 % packet Commonly known as:  METAMUCIL Take 1 packet by mouth daily.   ranitidine 150 MG tablet Commonly known as:  ZANTAC Take 1 tablet (150 mg total) by mouth 2 (two) times daily. Reported on 03/10/2016   sucralfate 1 g tablet Commonly known as:  CARAFATE Take 1 tablet (1 g total) by mouth 2 (two) times daily as needed (heartburn  or refllux).   VISION FORMULA 2 PO Take by mouth daily.   MULTIVITAMIN ADULT PO Take by mouth daily.   Vitamin D3 1000 units Caps Take 1,000 Units by mouth daily.   zolpidem 5 MG tablet Commonly known as:  AMBIEN TAKE 1 TABLET BY MOUTH AT BEDTIME AS NEEDED FOR SLEEP       Allergies:  Allergies  Allergen Reactions  . Ciprofloxacin Diarrhea  . Lisinopril Other (See Comments)    Unknown   . Penicillins Swelling  unknwon reaction.  tolerates amoxicillin Has patient had a PCN reaction causing immediate rash, facial/tongue/throat swelling, SOB or lightheadedness with hypotension: No Has patient had a PCN reaction causing severe rash involving mucus membranes or skin necrosis: No Has patient had a PCN reaction that required hospitalization: No Has patient had a PCN reaction occurring within the last 10 years: Yes If all of the above answers are "NO", then may proceed with Cephalosporin use.   . Sulfa Antibiotics Itching and Rash    Family History: Family History  Problem Relation Age of Onset  . Osteoporosis Mother   . Alzheimer's disease Father   . Kidney disease Neg Hx   . Prostate cancer Neg Hx   . Bladder Cancer Neg Hx   . Kidney cancer Neg Hx     Social History:  reports that he quit smoking about 46 years ago. His smoking use included cigarettes. He has a 18.00 pack-year smoking history. he has never used smokeless tobacco. He reports that he does not drink alcohol or use drugs.  ROS: UROLOGY Frequent Urination?: No Hard to postpone urination?: No Burning/pain with urination?: No Get up at night to urinate?: No Leakage of urine?: No Urine stream starts and stops?: No Trouble starting stream?: No Do you have to strain to urinate?: No Blood in urine?: No Urinary tract infection?: No Sexually transmitted disease?: No Injury to kidneys or bladder?: No Painful intercourse?: No Weak stream?: No Erection problems?: No Penile pain?:  No  Gastrointestinal Nausea?: No Vomiting?: No Indigestion/heartburn?: No Diarrhea?: No Constipation?: No  Constitutional Fever: No Night sweats?: No Weight loss?: No Fatigue?: No  Skin Skin rash/lesions?: No Itching?: No  Eyes Blurred vision?: No Double vision?: No  Ears/Nose/Throat Sore throat?: No Sinus problems?: No  Hematologic/Lymphatic Swollen glands?: No Easy bruising?: No  Cardiovascular Leg swelling?: No Chest pain?: No  Respiratory Cough?: No Shortness of breath?: No  Endocrine Excessive thirst?: No  Musculoskeletal Back pain?: No Joint pain?: No  Neurological Headaches?: No Dizziness?: No  Psychologic Depression?: No Anxiety?: No  Physical Exam: BP (!) 158/82   Pulse 73   Ht 6\' 1"  (1.854 m)   Wt 253 lb (114.8 kg)   BMI 33.38 kg/m   Constitutional: Well nourished. Alert and oriented, No acute distress. HEENT: [place] AT, moist mucus membranes. Trachea midline, no masses. Cardiovascular: No clubbing, cyanosis, or edema. Respiratory: Normal respiratory effort, no increased work of breathing. Skin: No rashes, bruises or suspicious lesions. Lymph: No cervical or inguinal adenopathy. Neurologic: Grossly intact, no focal deficits, moving all 4 extremities. Psychiatric: Normal mood and affect.     Laboratory Data: Lab Results  Component Value Date   WBC 5.4 02/18/2017   HGB 12.9 (L) 03/03/2017   HCT 38.3 (L) 03/03/2017   MCV 88.3 02/18/2017   PLT 170 02/18/2017    Lab Results  Component Value Date   CREATININE 1.19 03/03/2017    Lab Results  Component Value Date   PSA 0.81 11/22/2016   PSA 0.9 02/27/2014    Lab Results  Component Value Date   HGBA1C 5.9 11/22/2016    I have reviewed the labs.  Pertinent Imaging: No new imaging  Assessment & Plan:    1. UTI -Resolved  2. History of recurrent UTI Bacterial colonization related to clean intermittent catheterization Contact our office for symptoms of UTI  3.  Benign prostatic hyperplasia with urinary obstruction and frequency Continue Flomax and finasteride Return to clinic in 1 year for I PSS and exam  4. Bladder diverticulum Appreciated on most recent CT scan, likely related to chronic outlet obstruction Explained to the patient that no intervention is warranted for the diverticulum at this time  5. Nocturia  - I explained to the patient that nocturia is often multi-factorial and difficult to treat.  Sleeping disorders, heart conditions, peripheral vascular disease, diabetes, an enlarged prostate for men, an urethral stricture causing bladder outlet obstruction and/or certain medications can contribute to nocturia.  - I have suggested that the patient avoid caffeine after noon and alcohol in the evening.  He or she may also benefit from fluid restrictions after 6:00 in the evening and voiding just prior to bedtime.  - I have explained that research studies have showed that over 84% of patients with sleep apnea reported frequent nighttime urination.   With sleep apnea, oxygen decreases, carbon dioxide increases, the blood become more acidic, the heart rate drops and blood vessels in the lung constrict.  The body is then alerted that something is very wrong. The sleeper must wake enough to reopen the airway. By this time, the heart is racing and experiences a false signal of fluid overload. The heart excretes a hormone-like protein that tells the body to get rid of sodium and water, resulting in nocturia.  -  I also informed the patient that a recent study noted that decreasing sodium intake to 2.3 grams daily, if they don't have issues with hyponatremia, can also reduce the number of nightly voids  - The patient may benefit from a discussion with his or her primary care physician to see if he or she has risk factors for sleep apnea or other sleep disturbances and obtaining a sleep study.  Zara Council, [facility]  [address]  [place], [place] [address] [phone]

## 2017-11-01 NOTE — Telephone Encounter (Signed)
Pt request to speak with CMA to discuss his BP. Pt stated that his BP is currently 129/73 but in the evenings he has noticed that his BP has been 154/75 and he is concerned because his BP is usually lower. Pt denied vomiting or nausea. Please advise. Thanks TNP

## 2017-11-02 ENCOUNTER — Encounter: Payer: Self-pay | Admitting: Urology

## 2017-11-02 ENCOUNTER — Ambulatory Visit: Payer: PPO | Admitting: Urology

## 2017-11-02 VITALS — BP 158/82 | HR 73 | Ht 73.0 in | Wt 253.0 lb

## 2017-11-02 DIAGNOSIS — N138 Other obstructive and reflux uropathy: Secondary | ICD-10-CM

## 2017-11-02 DIAGNOSIS — N323 Diverticulum of bladder: Secondary | ICD-10-CM

## 2017-11-02 DIAGNOSIS — N401 Enlarged prostate with lower urinary tract symptoms: Secondary | ICD-10-CM | POA: Diagnosis not present

## 2017-11-02 DIAGNOSIS — N390 Urinary tract infection, site not specified: Secondary | ICD-10-CM | POA: Diagnosis not present

## 2017-11-02 DIAGNOSIS — R351 Nocturia: Secondary | ICD-10-CM

## 2017-11-02 MED ORDER — METOPROLOL SUCCINATE ER 50 MG PO TB24: 50.0000 mg | ORAL_TABLET | Freq: Every day | ORAL | 0 refills | Status: DC

## 2017-11-02 NOTE — Telephone Encounter (Signed)
Patient walked in to the office for a blood pressure check. He states he was just seen by Urology today and his blood pressure was elevated at 158/82. I checked patients blood pressure and heart rate and the results are:  BP 132/74 (left arm), 122/74 (right arm) HR 71  Per Dr. Caryn Section, patient should increase Metoprolol form 1/2 tablet daily to 1 full tablet daily, and report back to [place] in 1 week of how blood pressure and heart rate readings are doing. Patient was advised of this and verbally voiced understanding.

## 2017-11-02 NOTE — Progress Notes (Signed)
Patient walked in to the office for a blood pressure check. He states he was just seen by Urology today and his blood pressure was elevated at 158/82. I checked patients blood pressure and heart rate and the results are:  BP 132/74 (left arm), 122/74 (right arm) HR 71  Per Dr. Caryn Section, patient should increase Metoprolol form 1/2 tablet daily to 1 full tablet daily, and report back to [place] in 1 week of how blood pressure and heart rate readings are doing. Patient was advised of this and verbally voiced understanding.

## 2017-11-03 DIAGNOSIS — L821 Other seborrheic keratosis: Secondary | ICD-10-CM | POA: Diagnosis not present

## 2017-11-03 DIAGNOSIS — L308 Other specified dermatitis: Secondary | ICD-10-CM | POA: Diagnosis not present

## 2017-11-03 DIAGNOSIS — L570 Actinic keratosis: Secondary | ICD-10-CM | POA: Diagnosis not present

## 2017-11-03 DIAGNOSIS — X32XXXA Exposure to sunlight, initial encounter: Secondary | ICD-10-CM | POA: Diagnosis not present

## 2017-11-07 ENCOUNTER — Telehealth: Payer: Self-pay | Admitting: Family Medicine

## 2017-11-07 DIAGNOSIS — R339 Retention of urine, unspecified: Secondary | ICD-10-CM | POA: Diagnosis not present

## 2017-11-07 NOTE — Telephone Encounter (Signed)
Patient was advised. Expressed understanding.  

## 2017-11-07 NOTE — Telephone Encounter (Signed)
That's pretty good. Continue one tablet of metoprolol a day.

## 2017-11-07 NOTE — Telephone Encounter (Signed)
Patient states that Dr. Caryn Section asked him to call in with blood pressure reading.  He took his blood pressure this morning and it was 133/72.  He states that this is about what it is all the time.

## 2017-11-14 ENCOUNTER — Telehealth: Payer: Self-pay | Admitting: Family Medicine

## 2017-11-14 ENCOUNTER — Other Ambulatory Visit: Payer: Self-pay | Admitting: Family Medicine

## 2017-11-14 DIAGNOSIS — R0989 Other specified symptoms and signs involving the circulatory and respiratory systems: Secondary | ICD-10-CM

## 2017-11-14 MED ORDER — METOPROLOL SUCCINATE ER 50 MG PO TB24: 50.0000 mg | ORAL_TABLET | Freq: Every day | ORAL | 3 refills | Status: DC

## 2017-11-14 NOTE — Telephone Encounter (Signed)
Patient is requesting refill on his metoprolol succinate (TOPROL-XL) 50 MG 24 hr tablet       [place]

## 2017-12-12 ENCOUNTER — Other Ambulatory Visit: Payer: Self-pay | Admitting: Family Medicine

## 2017-12-13 ENCOUNTER — Other Ambulatory Visit: Payer: Self-pay | Admitting: Family Medicine

## 2017-12-13 MED ORDER — TEMAZEPAM 30 MG PO CAPS: ORAL_CAPSULE | ORAL | 1 refills | Status: DC

## 2017-12-13 NOTE — Telephone Encounter (Signed)
Patient needs refills on Temazepam 30 mg. sent to [facility] on [address]

## 2017-12-13 NOTE — Telephone Encounter (Signed)
Please advise? Medication is not in med list but is in history.

## 2018-01-11 ENCOUNTER — Encounter: Payer: Self-pay | Admitting: Family Medicine

## 2018-01-11 ENCOUNTER — Ambulatory Visit (INDEPENDENT_AMBULATORY_CARE_PROVIDER_SITE_OTHER): Payer: PPO | Admitting: Family Medicine

## 2018-01-11 VITALS — BP 92/50 | HR 65 | Temp 98.0°F | Resp 16 | Ht 73.0 in | Wt 251.0 lb

## 2018-01-11 DIAGNOSIS — R42 Dizziness and giddiness: Secondary | ICD-10-CM | POA: Diagnosis not present

## 2018-01-11 DIAGNOSIS — I10 Essential (primary) hypertension: Secondary | ICD-10-CM

## 2018-01-11 MED ORDER — SUCRALFATE 1 G PO TABS: 1.0000 g | ORAL_TABLET | Freq: Two times a day (BID) | ORAL | 12 refills | Status: DC | PRN

## 2018-01-11 NOTE — Progress Notes (Signed)
Patient: Bryan Nelson Male    DOB: December 05, 1931   84 y.o.   MRN: [id number] Visit Date: 01/11/2018  Today's Provider: Lelon Huh, MD   Chief Complaint  Patient presents with  . Follow-up  . Hypertension   Subjective:    HPI   Hypertension, follow-up:  BP Readings from Last 3 Encounters:  01/11/18 (!) 92/50  11/02/17 122/74  11/02/17 (!) 158/82    He was last seen for hypertension 3 months ago.  BP at that visit was 132/74. Management since that visit includes; no changes. changes since last visit; on 11/02/2017 increased metoprolol 50 mg from 1/2 tablet to 1 full tablet qd due to BP being elevated to 155/89 at his urologist.He reports good compliance with treatment.  He is exercising. He is adherent to low salt diet.   Outside blood pressures are usually in the 120s and 130s.  He is experiencing none.  Patient denies none.   Cardiovascular risk factors include advanced age (older than 69 for men, 40 for women).  Use of agents associated with hypertension: none -----------------------------------------------------------------  Patient states he is still having some dizziness. He was advised to stop tamsulosin to see if that had any effect on dizziness, but he states it didn't improve at all after a few weeks and has since starting back on tamuolsin. He states dizziness is usually mild and brief, usually after standing up quickly. It is no worse since increasing metoprolol. Is not bad enough to make him loose his balance or fall.   Also patient states he is having constipation. He is having to take stool softeners and metamucil to have bowel movements. Patient states constipations has been going on for 2 years. But seems worse on last few months.   Allergies  Allergen Reactions  . Ciprofloxacin Diarrhea  . Lisinopril Other (See Comments)    Unknown   . Penicillins Swelling    unknwon reaction.  tolerates amoxicillin Has patient had a PCN reaction causing  immediate rash, facial/tongue/throat swelling, SOB or lightheadedness with hypotension: No Has patient had a PCN reaction causing severe rash involving mucus membranes or skin necrosis: No Has patient had a PCN reaction that required hospitalization: No Has patient had a PCN reaction occurring within the last 10 years: Yes If all of the above answers are "NO", then may proceed with Cephalosporin use.   . Sulfa Antibiotics Itching and Rash     Current Outpatient Medications:  .  aspirin 81 MG tablet, Take 81 mg by mouth daily., Disp: , Rfl:  .  atorvastatin (LIPITOR) 10 MG tablet, Take 1 tablet (10 mg total) by mouth daily., Disp: 90 tablet, Rfl: 4 .  benazepril-hydrochlorthiazide (LOTENSIN HCT) 5-6.25 MG tablet, Take 1 tablet by mouth daily., Disp: 90 tablet, Rfl: 3 .  Cholecalciferol (VITAMIN D3) 1000 units CAPS, Take 1,000 Units by mouth daily., Disp: , Rfl:  .  clopidogrel (PLAVIX) 75 MG tablet, TAKE ONE TABLET BY MOUTH ONCE DAILY, Disp: 90 tablet, Rfl: 4 .  docusate sodium (COLACE) 100 MG capsule, Take 1 capsule (100 mg total) by mouth 2 (two) times daily., Disp: 60 capsule, Rfl: 0 .  finasteride (PROSCAR) 5 MG tablet, Take 1 tablet (5 mg total) by mouth daily., Disp: 90 tablet, Rfl: 3 .  isosorbide dinitrate (ISORDIL) 30 MG tablet, Take 1 tablet (30 mg total) by mouth every morning., Disp: 90 tablet, Rfl: 4 .  Magnesium 250 MG TABS, Take 250 mg by mouth daily., Disp: ,  Rfl:  .  metoprolol succinate (TOPROL-XL) 50 MG 24 hr tablet, Take 1 tablet (50 mg total) by mouth daily. Take with or immediately following a meal., Disp: 90 tablet, Rfl: 3 .  Mirabegron (MYRBETRIQ PO), Take by mouth., Disp: , Rfl:  .  Multiple Vitamins-Minerals (MULTIVITAMIN ADULT PO), Take by mouth daily., Disp: , Rfl:  .  Multiple Vitamins-Minerals (VISION FORMULA 2 PO), Take by mouth daily., Disp: , Rfl:  .  nitroGLYCERIN (NITROSTAT) 0.4 MG SL tablet, Place 0.4 mg under the tongue every 5 (five) minutes as needed for  chest pain. Reported on 03/10/2016, Disp: , Rfl:  .  omeprazole (PRILOSEC) 40 MG capsule, TAKE ONE CAPSULE BY MOUTH TWICE DAILY, Disp: 180 capsule, Rfl: 4 .  psyllium (METAMUCIL) 58.6 % packet, Take 1 packet by mouth daily., Disp: , Rfl:  .  ranitidine (ZANTAC) 150 MG tablet, TAKE ONE TABLET BY MOUTH TWICE DAILY, Disp: 180 tablet, Rfl: 4 .  sucralfate (CARAFATE) 1 g tablet, Take 1 tablet (1 g total) by mouth 2 (two) times daily as needed (heartburn or refllux)., Disp: 60 tablet, Rfl: 12 .  tamsulosin (FLOMAX) 0.4 MG CAPS capsule, Take 0.4 mg by mouth daily., Disp: , Rfl:  .  temazepam (RESTORIL) 30 MG capsule, take 1 capsule by mouth at bedtime if needed, Disp: 90 capsule, Rfl: 1 .  zolpidem (AMBIEN) 5 MG tablet, TAKE 1 TABLET BY MOUTH AT BEDTIME AS NEEDED FOR SLEEP, Disp: 30 tablet, Rfl: 4 .  nitrofurantoin, macrocrystal-monohydrate, (MACROBID) 100 MG capsule, Take 1 capsule (100 mg total) by mouth every 12 (twelve) hours. (Patient not taking: Reported on 01/11/2018), Disp: 14 capsule, Rfl: 0  Current Facility-Administered Medications:  .  lidocaine (XYLOCAINE) 2 % jelly 1 application, 1 application, Urethral, Once, Collier Flowers, MD  Review of Systems  Constitutional: Negative for appetite change, chills and fever.  Respiratory: Negative for chest tightness, shortness of breath and wheezing.   Cardiovascular: Negative for chest pain and palpitations.  Gastrointestinal: Negative for abdominal pain, nausea and vomiting.    Social History   Tobacco Use  . Smoking status: Former Smoker    Packs/day: 1.50    Years: 12.00    Pack years: 18.00    Types: Cigarettes    Last attempt to quit: 08/24/1971    Years since quitting: 46.4  . Smokeless tobacco: Never Used  Substance Use Topics  . Alcohol use: No    Alcohol/week: 0.0 oz   Objective:   BP (!) 92/50 (BP Location: Right Arm, Patient Position: Sitting, Cuff Size: Large)   Pulse 65   Temp 98 F (36.7 C) (Oral)   Resp 16   Ht 6\' 1"   (1.854 m)   Wt 251 lb (113.9 kg)   SpO2 95%   BMI 33.12 kg/m  Vitals:   01/11/18 0812  BP: (!) 92/50  Pulse: 65  Resp: 16  Temp: 98 F (36.7 C)  TempSrc: Oral  SpO2: 95%  Weight: 251 lb (113.9 kg)  Height: 6\' 1"  (1.854 m)     Physical Exam   General Appearance:    Alert, cooperative, no distress  Eyes:    PERRL, conjunctiva/corneas clear, EOM's intact       Lungs:     Clear to auscultation bilaterally, respirations unlabored  Heart:    Regular rate and rhythm  Neurologic:   Awake, alert, oriented x 3. No apparent focal neurological           defect.  Assessment & Plan:     1. Essential hypertension Much better since increasing metoprolol, but a little hypotensive today. He states home BP is never this low. He will call if he sees home BP getting under 110. He anticipates getting labs at [facility] in the next few months.  2. . Dizziness He reports no improvement when he stopped tamsulosin, although he did go up on metoprolol around the same time. He not bothered by it very much right now and counseled on precautions for orthostasis. Is to call if it gets any worse.   Follow up 6 months.        Lelon Huh, MD  [facility]

## 2018-02-07 DIAGNOSIS — E669 Obesity, unspecified: Secondary | ICD-10-CM | POA: Diagnosis not present

## 2018-02-07 DIAGNOSIS — R011 Cardiac murmur, unspecified: Secondary | ICD-10-CM | POA: Diagnosis not present

## 2018-02-07 DIAGNOSIS — K219 Gastro-esophageal reflux disease without esophagitis: Secondary | ICD-10-CM | POA: Diagnosis not present

## 2018-02-07 DIAGNOSIS — R0602 Shortness of breath: Secondary | ICD-10-CM | POA: Diagnosis not present

## 2018-02-07 DIAGNOSIS — I2510 Atherosclerotic heart disease of native coronary artery without angina pectoris: Secondary | ICD-10-CM | POA: Diagnosis not present

## 2018-02-07 DIAGNOSIS — E782 Mixed hyperlipidemia: Secondary | ICD-10-CM | POA: Diagnosis not present

## 2018-02-07 DIAGNOSIS — R42 Dizziness and giddiness: Secondary | ICD-10-CM | POA: Diagnosis not present

## 2018-02-07 DIAGNOSIS — I10 Essential (primary) hypertension: Secondary | ICD-10-CM | POA: Diagnosis not present

## 2018-02-12 ENCOUNTER — Emergency Department
Admission: EM | Admit: 2018-02-12 | Discharge: 2018-02-12 | Disposition: A | Discharge status: Home / Self Care | Payer: PPO | Attending: Student in an Organized Health Care Education/Training Program | Admitting: Student in an Organized Health Care Education/Training Program

## 2018-02-12 ENCOUNTER — Emergency Department: Payer: PPO

## 2018-02-12 DIAGNOSIS — Z79899 Other long term (current) drug therapy: Secondary | ICD-10-CM | POA: Diagnosis not present

## 2018-02-12 DIAGNOSIS — Z955 Presence of coronary angioplasty implant and graft: Secondary | ICD-10-CM | POA: Diagnosis not present

## 2018-02-12 DIAGNOSIS — Z87891 Personal history of nicotine dependence: Secondary | ICD-10-CM | POA: Diagnosis not present

## 2018-02-12 DIAGNOSIS — R1013 Epigastric pain: Secondary | ICD-10-CM | POA: Diagnosis not present

## 2018-02-12 DIAGNOSIS — Z7982 Long term (current) use of aspirin: Secondary | ICD-10-CM | POA: Diagnosis not present

## 2018-02-12 LAB — CBC
HEMATOCRIT: 41.6 % (ref 40.0–52.0)
HEMOGLOBIN: 14.4 g/dL (ref 13.0–18.0)
MCH: 31 pg (ref 26.0–34.0)
MCHC: 34.6 g/dL (ref 32.0–36.0)
MCV: 89.7 fL (ref 80.0–100.0)
Platelets: 233 10*3/uL (ref 150–440)
RBC: 4.64 MIL/uL (ref 4.40–5.90)
RDW: 14.9 % — ABNORMAL HIGH (ref 11.5–14.5)
WBC: 6 10*3/uL (ref 3.8–10.6)

## 2018-02-12 LAB — COMPREHENSIVE METABOLIC PANEL
ALBUMIN: 4 g/dL (ref 3.5–5.0)
ALT: 43 U/L (ref 17–63)
ANION GAP: 12 (ref 5–15)
AST: 38 U/L (ref 15–41)
Alkaline Phosphatase: 53 U/L (ref 38–126)
BUN: 24 mg/dL — ABNORMAL HIGH (ref 6–20)
CHLORIDE: 100 mmol/L — AB (ref 101–111)
CO2: 25 mmol/L (ref 22–32)
Calcium: 8.9 mg/dL (ref 8.9–10.3)
Creatinine, Ser: 1.14 mg/dL (ref 0.61–1.24)
GFR calc non Af Amer: 57 mL/min — ABNORMAL LOW (ref >60)
Glucose, Bld: 135 mg/dL — ABNORMAL HIGH (ref 65–99)
Potassium: 3.2 mmol/L — ABNORMAL LOW (ref 3.5–5.1)
SODIUM: 137 mmol/L (ref 135–145)
Total Bilirubin: 0.6 mg/dL (ref 0.3–1.2)
Total Protein: 7 g/dL (ref 6.5–8.1)

## 2018-02-12 LAB — LIPASE, BLOOD: LIPASE: 34 U/L (ref 11–51)

## 2018-02-12 LAB — GLUCOSE, CAPILLARY: Glucose-Capillary: 78 mg/dL (ref 65–99)

## 2018-02-12 LAB — TROPONIN I: Troponin I: <0.03 ng/mL (ref <0.03)

## 2018-02-12 MED ORDER — SUCRALFATE 1 G PO TABS: 1.0000 g | ORAL_TABLET | Freq: Two times a day (BID) | ORAL | 0 refills | Status: DC

## 2018-02-12 MED ORDER — GI COCKTAIL ~~LOC~~
30.0000 mL | Freq: Once | ORAL | Status: AC
  Administered 2018-02-12: 30 mL via ORAL
  Filled 2018-02-12: qty 30

## 2018-02-12 NOTE — Discharge Instructions (Signed)

## 2018-02-12 NOTE — ED Triage Notes (Signed)
Pt reports epigastric pain today.  Pt states that it doesn't feel like heart burn.  Pt is A&Ox4, in NAD.  Pt denies n/v/d.

## 2018-02-12 NOTE — ED Provider Notes (Signed)
[facility] Emergency Department Provider Note    First MD Initiated Contact with Patient 02/12/18 1751     (approximate)  I have reviewed the triage vital signs and the nursing notes.   HISTORY  Chief Complaint Abdominal Pain    HPI Bryan Nelson is a 84 y.o. male is to the ER with chief complaint of epigastric discomfort that started a little bit last night and progressively worsened today.  Is worse when he is laying flat.  Does have a history of heartburn but states that his heartburn is typically higher up in his chest and today it is more in his epigastric area.  Nonradiating through his back.  Denies any fevers.  No shortness of breath or chest pain at this time.   States he took a Tums with some improvement in symptoms.  Is moving his bowels and is passing gas.   Past Medical History:  Diagnosis Date  . Arteriosclerosis of coronary artery 08/26/2011   Overview:      a.  1999 PCI of the mid LAD with stent.      b.  2002 Cath [facility]: EF 56%. RCA-distal 25/25%. Left main-50% ostial.  Left circumflex-25% OM2.  LAD-25% proximal.  75% mid.  25/25% distal. 75% D1.      c.  07/2011 PCI of RCA with DES.[place]   . Bleeding gastrointestinal 08/26/2011   Overview:      a.  Chronic abdominal pain, present improving.      b.  Duodenitis and gastritis by EGD in 2000.      c.  Pylori in 2000, treated.      d.  Gastroesophageal reflux disease.      e.  Diverticular disease.    . BP (high blood pressure) 08/26/2011  . Cancer ([place]) 2015   Lymphoma  . GERD (gastroesophageal reflux disease)   . Heart disease   . Helicobacter pylori infection 06/18/2015   by EGD RX 08/18/1999   . History of adenomatous polyp of colon 06/18/2015  . HOH (hard of hearing)    Bilateral Hearing  Aids   Family History  Problem Relation Age of Onset  . Osteoporosis Mother   . Alzheimer's disease Father   . Kidney disease Neg Hx   . Prostate cancer Neg Hx   . Bladder Cancer Neg Hx   .  Kidney cancer Neg Hx    Past Surgical History:  Procedure Laterality Date  . ANGIOPLASTY    . CATARACT EXTRACTION    . COLONOSCOPY  2016  . CORONARY ANGIOPLASTY    . EYE SURGERY Bilateral    Cataract Extraction with IOL  . GREEN LIGHT LASER TURP (TRANSURETHRAL RESECTION OF PROSTATE N/A 04/16/2015   Procedure: GREEN LIGHT LASER TURP (TRANSURETHRAL RESECTION OF PROSTATE WITH BLADDER BIOPSY;  Surgeon: Collier Flowers, MD;  Location: [facility] ORS;  Service: Urology;  Laterality: N/A;  . heart stent placement     1998, 2000, 2012  . TRANSURETHRAL RESECTION OF PROSTATE N/A 03/02/2017   Procedure: TRANSURETHRAL RESECTION OF THE PROSTATE (TURP);  Surgeon: Hollice Espy, MD;  Location: [facility] ORS;  Service: Urology;  Laterality: N/A;   Patient Active Problem List   Diagnosis Date Noted  . Labile blood pressure 03/25/2017  . BPH (benign prostatic hyperplasia) 03/02/2017  . Malleolar fracture (Right) 09/10/2016  . Closed dislocation of right ankle 09/10/2016  . Recurrent UTI 12/21/2015  . Urinary retention 09/24/2015  . Narrowing of intervertebral disc space 06/18/2015  . Diverticulitis  06/18/2015  . Esophageal reflux 06/18/2015  . Familial multiple lipoprotein-type hyperlipidemia 06/18/2015  . Insomnia 06/18/2015  . Vitamin D deficiency 06/18/2015  . DLBCL (diffuse large B cell lymphoma) ([place]) 04/10/2014  . ED (erectile dysfunction) of organic origin 08/03/2012  . Incomplete bladder emptying 08/03/2012  . Breath shortness 12/09/2011  . Arteriosclerosis of coronary artery 08/26/2011  . Bleeding gastrointestinal 08/26/2011  . HLD (hyperlipidemia) 08/26/2011  . BP (high blood pressure) 08/26/2011  . Disorder of peripheral nervous system 08/26/2011  . Peripheral nerve disease 08/26/2011  . Current tobacco use 08/26/2011      Prior to Admission medications   Medication Sig Start Date End Date Taking? Authorizing Provider  aspirin 81 MG tablet Take 81 mg by mouth daily.    [provider]  atorvastatin (LIPITOR) 10 MG tablet Take 1 tablet (10 mg total) by mouth daily. 08/26/16   [name], MD  benazepril-hydrochlorthiazide (LOTENSIN HCT) 5-6.25 MG tablet Take 1 tablet by mouth daily. 10/12/17   [name], MD  Cholecalciferol (VITAMIN D3) 1000 units CAPS Take 1,000 Units by mouth daily.    [provider]  clopidogrel (PLAVIX) 75 MG tablet TAKE ONE TABLET BY MOUTH ONCE DAILY 11/15/17   [name], MD  docusate sodium (COLACE) 100 MG capsule Take 1 capsule (100 mg total) by mouth 2 (two) times daily. 03/03/17   Hollice Espy, MD  finasteride (PROSCAR) 5 MG tablet Take 1 tablet (5 mg total) by mouth daily. 11/18/16   Zara Council A, PA-C  isosorbide dinitrate (ISORDIL) 30 MG tablet Take 1 tablet (30 mg total) by mouth every morning. 08/26/16   [name], MD  Magnesium 250 MG TABS Take 250 mg by mouth daily.    [provider]  metoprolol succinate (TOPROL-XL) 50 MG 24 hr tablet Take 1 tablet (50 mg total) by mouth daily. Take with or immediately following a meal. 11/14/17   [name], MD  Mirabegron (MYRBETRIQ PO) Take by mouth.    [provider]  Multiple Vitamins-Minerals (MULTIVITAMIN ADULT PO) Take by mouth daily.    [provider]  Multiple Vitamins-Minerals (VISION FORMULA 2 PO) Take by mouth daily.    [provider]  nitroGLYCERIN (NITROSTAT) 0.4 MG SL tablet Place 0.4 mg under the tongue every 5 (five) minutes as needed for chest pain. Reported on 03/10/2016    [provider]  omeprazole (PRILOSEC) 40 MG capsule TAKE ONE CAPSULE BY MOUTH TWICE DAILY 12/12/17   [name], MD  psyllium (METAMUCIL) 58.6 % packet Take 1 packet by mouth daily.    [provider]  ranitidine (ZANTAC) 150 MG tablet TAKE ONE TABLET BY MOUTH TWICE DAILY 12/12/17   [name], MD  sucralfate (CARAFATE) 1 g tablet Take 1 tablet (1 g total) by mouth 2 (two) times daily as needed  (heartburn or refllux). 01/11/18   [name], MD  tamsulosin (FLOMAX) 0.4 MG CAPS capsule Take 0.4 mg by mouth daily. 08/03/12   [provider]  temazepam (RESTORIL) 30 MG capsule take 1 capsule by mouth at bedtime if needed 12/13/17   [name], MD  zolpidem (AMBIEN) 5 MG tablet TAKE 1 TABLET BY MOUTH AT BEDTIME AS NEEDED FOR SLEEP 05/15/17   [name], MD    Allergies Ciprofloxacin; Lisinopril; Penicillins; and Sulfa antibiotics    Social History Social History   Tobacco Use  . Smoking status: Former Smoker    Packs/day: 1.50  Years: 12.00    Pack years: 18.00    Types: Cigarettes    Last attempt to quit: 08/24/1971    Years since quitting: 46.5  . Smokeless tobacco: Never Used  Substance Use Topics  . Alcohol use: No    Alcohol/week: 0.0 oz  . Drug use: No    Review of Systems Patient denies headaches, rhinorrhea, blurry vision, numbness, shortness of breath, chest pain, edema, cough, abdominal pain, nausea, vomiting, diarrhea, dysuria, fevers, rashes or hallucinations unless otherwise stated above in HPI. ____________________________________________   PHYSICAL EXAM:  VITAL SIGNS: Vitals:   02/12/18 1621 02/12/18 1641  BP: (!) 143/77 (!) 161/81  Pulse: 87 90  Resp: 19 18  Temp: 98.9 F (37.2 C) 98.7 F (37.1 C)  SpO2: 96% 95%    Constitutional: Alert and oriented.  Eyes: Conjunctivae are normal.  Head: Atraumatic. Nose: No congestion/rhinnorhea. Mouth/Throat: Mucous membranes are moist.   Neck: No stridor. Painless ROM.  Cardiovascular: Normal rate, regular rhythm. Grossly normal heart sounds.  Good peripheral circulation. Respiratory: Normal respiratory effort.  No retractions. Lungs CTAB. Gastrointestinal: Soft and nontender. No distention. No abdominal bruits. No CVA tenderness. Genitourinary:  Musculoskeletal: No lower extremity tenderness nor edema.  No joint effusions. Neurologic:  Normal speech and language. No gross  focal neurologic deficits are appreciated. No facial droop Skin:  Skin is warm, dry and intact. No rash noted. Psychiatric: Mood and affect are normal. Speech and behavior are normal.  ____________________________________________   LABS (all labs ordered are listed, but only abnormal results are displayed)  Results for orders placed or performed during the hospital encounter of 02/12/18 (from the past 24 hour(s))  Lipase, blood     Status: None   Collection Time: 02/12/18  4:58 PM  Result Value Ref Range   Lipase 34 11 - 51 U/L  Comprehensive metabolic panel     Status: Abnormal   Collection Time: 02/12/18  4:58 PM  Result Value Ref Range   Sodium 137 135 - 145 mmol/L   Potassium 3.2 (L) 3.5 - 5.1 mmol/L   Chloride 100 (L) 101 - 111 mmol/L   CO2 25 22 - 32 mmol/L   Glucose, Bld 135 (H) 65 - 99 mg/dL   BUN 24 (H) 6 - 20 mg/dL   Creatinine, Ser 1.14 0.61 - 1.24 mg/dL   Calcium 8.9 8.9 - 10.3 mg/dL   Total Protein 7.0 6.5 - 8.1 g/dL   Albumin 4.0 3.5 - 5.0 g/dL   AST 38 15 - 41 U/L   ALT 43 17 - 63 U/L   Alkaline Phosphatase 53 38 - 126 U/L   Total Bilirubin 0.6 0.3 - 1.2 mg/dL   GFR calc non Af Amer 57 (L) >60 mL/min   GFR calc Af Amer >60 >60 mL/min   Anion gap 12 5 - 15  CBC     Status: Abnormal   Collection Time: 02/12/18  4:58 PM  Result Value Ref Range   WBC 6.0 3.8 - 10.6 K/uL   RBC 4.64 4.40 - 5.90 MIL/uL   Hemoglobin 14.4 13.0 - 18.0 g/dL   HCT 41.6 40.0 - 52.0 %   MCV 89.7 80.0 - 100.0 fL   MCH 31.0 26.0 - 34.0 pg   MCHC 34.6 32.0 - 36.0 g/dL   RDW 14.9 (H) 11.5 - 14.5 %   Platelets 233 150 - 440 K/uL  Troponin I     Status: None   Collection Time: 02/12/18  4:58 PM  Result Value  Ref Range   Troponin I <0.03 <0.03 ng/mL  Glucose, capillary     Status: None   Collection Time: 02/12/18  5:00 PM  Result Value Ref Range   Glucose-Capillary 78 65 - 99 mg/dL   Comment 1 Notify RN    Comment 2 Document in Chart     ____________________________________________  EKG My review and personal interpretation at Time: 16:23   Indication: epigastric pain  Rate: 80  Rhythm: sinus Axis: normal Other: normal intervals, no stemi, unchanged from previous ____________________________________________  RADIOLOGY  I personally reviewed all radiographic images ordered to evaluate for the above acute complaints and reviewed radiology reports and findings.  These findings were personally discussed with the patient.  Please see medical record for radiology report.  ____________________________________________   PROCEDURES  Procedure(s) performed:  Procedures    Critical Care performed: no ____________________________________________   INITIAL IMPRESSION / ASSESSMENT AND PLAN / ED COURSE  Pertinent labs & imaging results that were available during my care of the patient were reviewed by me and considered in my medical decision making (see chart for details).   DDX: Gastritis, enteritis, ACS, esophagitis, pancreatitis, cholelithiasis, cholecystitis  Bryan Nelson is a 84 y.o. who presents to the ED with symptoms as described above.  Symptoms seem primarily consistent with gastritis which she has had in the past but patient does have high risk factors for the pathology therefore blood work was sent for the above differential.  Blood work is reassuring after nearly 24 hours of symptoms with negative troponin this does not seem clinically consistent with ACS or dissection.  No evidence of infectious process.  X-ray ordered to evaluate for obstructive pattern or free air shows none.  Patient tolerating oral hydration therefore will trial GI cocktail to see for improvement in symptoms.  Clinical Course as of Feb 13 2004  Sun Feb 12, 2018  2000 She reassessed and feeling much improved after GI cocktail.  Repeat abdominal exam is soft and benign.  X-ray shows no evidence of obstructive pattern.  Is still passing gas.   At time of do believe patient is stable and appropriate for outpatient follow-up.   [PR]    Clinical Course User Index [PR] Merlyn Lot, MD     As part of my medical decision making, I reviewed the following data within the [place] notes reviewed and incorporated, Labs reviewed, notes from prior ED visits.   ____________________________________________   FINAL CLINICAL IMPRESSION(S) / ED DIAGNOSES  Final diagnoses:  Epigastric pain      NEW MEDICATIONS STARTED DURING THIS VISIT:  New Prescriptions   No medications on file     Note:  This document was prepared using Dragon voice recognition software and may include unintentional dictation errors.    Merlyn Lot, MD 02/12/18 2011

## 2018-02-12 NOTE — ED Notes (Signed)
Discharge instructions reviewed with patient. Questions fielded by this RN. Patient verbalizes understanding of instructions. Patient discharged home in stable condition per robinson. No acute distress noted at time of discharge.   No peripheral IV placed this visit.

## 2018-02-12 NOTE — ED Notes (Signed)
Pt states has hx or reflux, past reflux pain in chest area. Pt complains of mid-epigastric pain, denies any N/V/D.

## 2018-02-18 ENCOUNTER — Other Ambulatory Visit: Payer: Self-pay | Admitting: Family Medicine

## 2018-02-27 ENCOUNTER — Ambulatory Visit: Payer: PPO

## 2018-02-27 VITALS — BP 128/74 | HR 97 | Resp 16 | Ht 73.0 in | Wt 256.8 lb

## 2018-02-27 DIAGNOSIS — N390 Urinary tract infection, site not specified: Secondary | ICD-10-CM | POA: Diagnosis not present

## 2018-02-27 LAB — URINALYSIS, COMPLETE
BILIRUBIN UA: NEGATIVE
Glucose, UA: NEGATIVE
KETONES UA: NEGATIVE
NITRITE UA: NEGATIVE
RBC UA: NEGATIVE
SPEC GRAV UA: 1.02 (ref 1.005–1.030)
Urobilinogen, Ur: 0.2 mg/dL (ref 0.2–1.0)
pH, UA: 7 (ref 5.0–7.5)

## 2018-02-27 LAB — MICROSCOPIC EXAMINATION

## 2018-02-27 MED ORDER — NITROFURANTOIN MACROCRYSTAL 100 MG PO CAPS: 100.0000 mg | ORAL_CAPSULE | Freq: Two times a day (BID) | ORAL | 0 refills | Status: AC

## 2018-02-27 NOTE — Progress Notes (Signed)
Pt present in office for dysuria. Pt provided urine sample for analysis and culture. Pt urine looked suspicious for infection, pt started on Macrodantin per Dr. Matilde Sprang.

## 2018-03-03 LAB — CULTURE, URINE COMPREHENSIVE

## 2018-03-16 ENCOUNTER — Ambulatory Visit (INDEPENDENT_AMBULATORY_CARE_PROVIDER_SITE_OTHER): Payer: PPO | Admitting: Family Medicine

## 2018-03-16 DIAGNOSIS — N390 Urinary tract infection, site not specified: Secondary | ICD-10-CM

## 2018-03-16 LAB — URINALYSIS, COMPLETE
BILIRUBIN UA: NEGATIVE
Glucose, UA: NEGATIVE
Ketones, UA: NEGATIVE
Nitrite, UA: NEGATIVE
PH UA: 7 (ref 5.0–7.5)
PROTEIN UA: NEGATIVE
Specific Gravity, UA: 1.015 (ref 1.005–1.030)
Urobilinogen, Ur: 0.2 mg/dL (ref 0.2–1.0)

## 2018-03-16 LAB — MICROSCOPIC EXAMINATION
EPITHELIAL CELLS (NON RENAL): NONE SEEN /HPF (ref 0–10)
WBC, UA: >30 /hpf — ABNORMAL HIGH (ref 0–5)

## 2018-03-16 MED ORDER — NITROFURANTOIN MONOHYD MACRO 100 MG PO CAPS: 100.0000 mg | ORAL_CAPSULE | Freq: Two times a day (BID) | ORAL | 0 refills | Status: DC

## 2018-03-16 NOTE — Progress Notes (Signed)
Patient presents today with urinary frequency and dysuria. He was on Nitrofurantoin started on 03/06/18. He feels the infection did not fully clear up. Patient has not had any Urological surgeries in the last 30 days.  A urine sample was collected by self catheter for UA, UCX. Per Stoioff another round of nitofurantoin sent to pharmacy.

## 2018-03-20 LAB — CULTURE, URINE COMPREHENSIVE

## 2018-03-31 ENCOUNTER — Other Ambulatory Visit: Payer: Self-pay

## 2018-03-31 ENCOUNTER — Ambulatory Visit (INDEPENDENT_AMBULATORY_CARE_PROVIDER_SITE_OTHER): Payer: PPO

## 2018-03-31 ENCOUNTER — Telehealth: Payer: Self-pay | Admitting: Urology

## 2018-03-31 VITALS — BP 128/64 | HR 72 | Ht 73.0 in | Wt 247.2 lb

## 2018-03-31 DIAGNOSIS — N390 Urinary tract infection, site not specified: Secondary | ICD-10-CM | POA: Diagnosis not present

## 2018-03-31 LAB — MICROSCOPIC EXAMINATION: Epithelial Cells (non renal): NONE SEEN /hpf (ref 0–10)

## 2018-03-31 LAB — URINALYSIS, COMPLETE
Bilirubin, UA: NEGATIVE
Glucose, UA: NEGATIVE
Ketones, UA: NEGATIVE
NITRITE UA: NEGATIVE
PH UA: 6 (ref 5.0–7.5)
Specific Gravity, UA: 1.01 (ref 1.005–1.030)
UUROB: 0.2 mg/dL (ref 0.2–1.0)

## 2018-03-31 LAB — BLADDER SCAN AMB NON-IMAGING: SCAN RESULT: 300

## 2018-03-31 MED ORDER — LEVOFLOXACIN 500 MG PO TABS: 750.0000 mg | ORAL_TABLET | Freq: Every day | ORAL | Status: DC

## 2018-03-31 MED ORDER — LEVOFLOXACIN 500 MG PO TABS: 750.0000 mg | ORAL_TABLET | Freq: Every day | ORAL | 0 refills | Status: AC

## 2018-03-31 NOTE — Telephone Encounter (Signed)
Patient came to the office this morning with possible UTI symptoms (frequency, burning).    Patient was added to the nurse schedule for this morning.

## 2018-03-31 NOTE — Addendum Note (Signed)
Addended by: Garnette Gunner on: 03/31/2018 03:35 PM   Modules accepted: Orders

## 2018-03-31 NOTE — Progress Notes (Signed)
Bryan Nelson is present in office today for a nurse visit. He is complaining of dysuria and frequency. Pt was seen recently and completed two rounds of antibiotics. He started Macrodantin which did not help, then was switched to Macrobid by Dr. Bernardo Heater. Pt states he is catheterizing himself once daily. Urine is suspicious for infection, sent for culture. Bladder scan was completed, noting 374ml of urine. Pt was instructed to start Levaquin 750mg , daily, for 10 days. He was also instructed to start catheterizing himself 2-3 times daily as needed. Due to intolerance of Levaquin (causes diarrhea) pt was instructed to also start a probiotic to help, per Dr. Diamantina Providence. Pt states understanding and will follow-up with regular urologist.

## 2018-03-31 NOTE — Telephone Encounter (Signed)
error 

## 2018-04-04 LAB — CULTURE, URINE COMPREHENSIVE

## 2018-04-10 ENCOUNTER — Telehealth: Payer: Self-pay

## 2018-04-10 NOTE — Telephone Encounter (Signed)
Please see urine culture notes for 03/31/18, nurse visits were not routed to provider.

## 2018-04-10 NOTE — Telephone Encounter (Signed)
There's a note attached to the urine culture results from Dr. Bernardo Heater.  Let me know if you can see it.

## 2018-04-12 NOTE — Telephone Encounter (Signed)
Patient notified that urine culture was sensitive to Levaquin and he states he is doing better

## 2018-04-18 ENCOUNTER — Ambulatory Visit (INDEPENDENT_AMBULATORY_CARE_PROVIDER_SITE_OTHER): Payer: PPO | Admitting: Family Medicine

## 2018-04-18 ENCOUNTER — Encounter: Payer: Self-pay | Admitting: Family Medicine

## 2018-04-18 VITALS — BP 105/63 | HR 86 | Temp 97.8°F | Resp 16 | Ht 73.0 in | Wt 247.0 lb

## 2018-04-18 DIAGNOSIS — M545 Low back pain, unspecified: Secondary | ICD-10-CM

## 2018-04-18 LAB — POCT URINALYSIS DIPSTICK
Bilirubin, UA: NEGATIVE
GLUCOSE UA: NEGATIVE
Ketones, UA: NEGATIVE
LEUKOCYTES UA: NEGATIVE
NITRITE UA: NEGATIVE
PROTEIN UA: POSITIVE — AB
Spec Grav, UA: 1.01 (ref 1.010–1.025)
Urobilinogen, UA: 0.2 E.U./dL
pH, UA: 6.5 (ref 5.0–8.0)

## 2018-04-18 MED ORDER — MELOXICAM 15 MG PO TABS: 15.0000 mg | ORAL_TABLET | Freq: Every day | ORAL | 0 refills | Status: AC

## 2018-04-18 NOTE — Progress Notes (Signed)
Patient: Bryan Nelson Male    DOB: 1932-03-01   84 y.o.   MRN: [id number] Visit Date: 04/18/2018  Today's Provider: Lelon Huh, MD   Chief Complaint  Patient presents with  . Back Pain    x 2 weeks   Subjective:    Back Pain  This is a new problem. Episode onset: 2 weeks ago. The problem occurs constantly. The problem is unchanged. The pain is present in the lumbar spine. The quality of the pain is described as aching. Pertinent negatives include no abdominal pain, chest pain, dysuria or fever.  Patient states he was treated for a UTI 10 days ago by his urologist. Current pain is directly over lumbar spine. Does not radiate. Doesn't recall any particular injury. Is not relieved by Tylenol.      Allergies  Allergen Reactions  . Ciprofloxacin Diarrhea  . Lisinopril Other (See Comments)    Unknown   . Penicillins Swelling    unknwon reaction.  tolerates amoxicillin Has patient had a PCN reaction causing immediate rash, facial/tongue/throat swelling, SOB or lightheadedness with hypotension: No Has patient had a PCN reaction causing severe rash involving mucus membranes or skin necrosis: No Has patient had a PCN reaction that required hospitalization: No Has patient had a PCN reaction occurring within the last 10 years: Yes If all of the above answers are "NO", then may proceed with Cephalosporin use.   . Sulfa Antibiotics Itching and Rash     Current Outpatient Medications:  .  aspirin 81 MG tablet, Take 81 mg by mouth daily., Disp: , Rfl:  .  atorvastatin (LIPITOR) 10 MG tablet, TAKE ONE TABLET BY MOUTH ONCE DAILY, Disp: 90 tablet, Rfl: 3 .  benazepril-hydrochlorthiazide (LOTENSIN HCT) 5-6.25 MG tablet, Take 1 tablet by mouth daily., Disp: 90 tablet, Rfl: 3 .  Cholecalciferol (VITAMIN D3) 1000 units CAPS, Take 1,000 Units by mouth daily., Disp: , Rfl:  .  clopidogrel (PLAVIX) 75 MG tablet, TAKE ONE TABLET BY MOUTH ONCE DAILY, Disp: 90 tablet, Rfl: 4 .  docusate  sodium (COLACE) 100 MG capsule, Take 1 capsule (100 mg total) by mouth 2 (two) times daily., Disp: 60 capsule, Rfl: 0 .  finasteride (PROSCAR) 5 MG tablet, Take 1 tablet (5 mg total) by mouth daily., Disp: 90 tablet, Rfl: 3 .  isosorbide dinitrate (ISORDIL) 30 MG tablet, Take 1 tablet (30 mg total) by mouth every morning., Disp: 90 tablet, Rfl: 4 .  Magnesium 250 MG TABS, Take 250 mg by mouth daily., Disp: , Rfl:  .  metoprolol succinate (TOPROL-XL) 50 MG 24 hr tablet, Take 1 tablet (50 mg total) by mouth daily. Take with or immediately following a meal., Disp: 90 tablet, Rfl: 3 .  Mirabegron (MYRBETRIQ PO), Take by mouth., Disp: , Rfl:  .  Multiple Vitamins-Minerals (MULTIVITAMIN ADULT PO), Take by mouth daily., Disp: , Rfl:  .  Multiple Vitamins-Minerals (VISION FORMULA 2 PO), Take by mouth daily., Disp: , Rfl:  .  nitroGLYCERIN (NITROSTAT) 0.4 MG SL tablet, Place 0.4 mg under the tongue every 5 (five) minutes as needed for chest pain. Reported on 03/10/2016, Disp: , Rfl:  .  omeprazole (PRILOSEC) 40 MG capsule, TAKE ONE CAPSULE BY MOUTH TWICE DAILY, Disp: 180 capsule, Rfl: 4 .  psyllium (METAMUCIL) 58.6 % packet, Take 1 packet by mouth daily., Disp: , Rfl:  .  ranitidine (ZANTAC) 150 MG tablet, TAKE ONE TABLET BY MOUTH TWICE DAILY, Disp: 180 tablet, Rfl: 4 .  sucralfate (CARAFATE) 1 g tablet, Take 1 tablet (1 g total) by mouth 2 (two) times daily as needed (heartburn or refllux)., Disp: 60 tablet, Rfl: 12 .  sucralfate (CARAFATE) 1 g tablet, Take 1 tablet (1 g total) by mouth 2 (two) times daily., Disp: 24 tablet, Rfl: 0 .  tamsulosin (FLOMAX) 0.4 MG CAPS capsule, Take 0.4 mg by mouth daily., Disp: , Rfl:  .  temazepam (RESTORIL) 30 MG capsule, take 1 capsule by mouth at bedtime if needed, Disp: 90 capsule, Rfl: 1 .  zolpidem (AMBIEN) 5 MG tablet, TAKE 1 TABLET BY MOUTH AT BEDTIME AS NEEDED FOR SLEEP, Disp: 30 tablet, Rfl: 4  Current Facility-Administered Medications:  .  lidocaine (XYLOCAINE) 2  % jelly 1 application, 1 application, Urethral, Once, Collier Flowers, MD  Review of Systems  Constitutional: Negative for appetite change, chills and fever.  Respiratory: Negative for chest tightness, shortness of breath and wheezing.   Cardiovascular: Negative for chest pain and palpitations.  Gastrointestinal: Negative for abdominal pain, nausea and vomiting.  Genitourinary: Negative for dysuria and frequency.  Musculoskeletal: Positive for arthralgias (in both feet) and back pain.  Neurological: Positive for dizziness.    Social History   Tobacco Use  . Smoking status: Former Smoker    Packs/day: 1.50    Years: 12.00    Pack years: 18.00    Types: Cigarettes    Last attempt to quit: 08/24/1971    Years since quitting: 46.6  . Smokeless tobacco: Never Used  Substance Use Topics  . Alcohol use: No    Alcohol/week: 0.0 standard drinks   Objective:   BP 105/63 (BP Location: Left Arm, Patient Position: Sitting, Cuff Size: Large)   Pulse 86   Temp 97.8 F (36.6 C) (Oral)   Resp 16   Ht 6\' 1"  (1.854 m)   Wt 247 lb (112 kg)   SpO2 96% Comment: room air  BMI 32.59 kg/m  Vitals:   04/18/18 0848  BP: 105/63  Pulse: 86  Resp: 16  Temp: 97.8 F (36.6 C)  TempSrc: Oral  SpO2: 96%  Weight: 247 lb (112 kg)  Height: 6\' 1"  (1.854 m)     Physical Exam  General appearance: alert, well developed, well nourished, cooperative and in no distress Head: Normocephalic, without obvious abnormality, atraumatic Respiratory: Respirations even and unlabored, normal respiratory rate MS: Tender directly over lumbar spine. No tenderness of paravertebral muscles.      Assessment & Plan:     1. Acute midline low back pain without sciatica  - POCT Urinalysis Dipstick - meloxicam (MOBIC) 15 MG tablet; Take 1 tablet (15 mg total) by mouth daily for 14 days.  Dispense: 14 tablet; Refill: 0  Apply heat pad 2-3 times a day. Call if not greatly improved in a week. Consider XR lumbar spine.         Lelon Huh, MD  [facility]

## 2018-05-10 ENCOUNTER — Encounter: Payer: Self-pay | Admitting: Family Medicine

## 2018-05-10 ENCOUNTER — Ambulatory Visit (INDEPENDENT_AMBULATORY_CARE_PROVIDER_SITE_OTHER): Payer: PPO | Admitting: Family Medicine

## 2018-05-10 VITALS — BP 114/62 | HR 88 | Temp 98.0°F | Wt 243.0 lb

## 2018-05-10 DIAGNOSIS — M5432 Sciatica, left side: Secondary | ICD-10-CM

## 2018-05-10 MED ORDER — PREDNISONE 10 MG PO TABS: ORAL_TABLET | ORAL | 0 refills | Status: AC

## 2018-05-10 NOTE — Progress Notes (Signed)
Patient: Bryan Nelson Male    DOB: 23-Jul-1932   84 y.o.   MRN: [id number] Visit Date: 05/10/2018  Today's Provider: Lelon Huh, MD   Chief Complaint  Patient presents with  . Back Pain    Started about a week ago.   Subjective:    Back Pain  This is a recurrent problem. The current episode started in the past 7 days. The problem has been gradually worsening since onset. The pain is present in the lumbar spine. The quality of the pain is described as aching. Radiates to: Radiates down left let. The symptoms are aggravated by position and standing. Associated symptoms include leg pain. Pertinent negatives include no abdominal pain, bladder incontinence, bowel incontinence, chest pain, dysuria, fever, headaches, numbness, paresis, paresthesias, pelvic pain, perianal numbness, tingling, weakness or weight loss. The treatment provided no relief.       Allergies  Allergen Reactions  . Ciprofloxacin Diarrhea  . Lisinopril Other (See Comments)    Unknown   . Penicillins Swelling    unknwon reaction.  tolerates amoxicillin Has patient had a PCN reaction causing immediate rash, facial/tongue/throat swelling, SOB or lightheadedness with hypotension: No Has patient had a PCN reaction causing severe rash involving mucus membranes or skin necrosis: No Has patient had a PCN reaction that required hospitalization: No Has patient had a PCN reaction occurring within the last 10 years: Yes If all of the above answers are "NO", then may proceed with Cephalosporin use.   . Sulfa Antibiotics Itching and Rash     Current Outpatient Medications:  .  aspirin 81 MG tablet, Take 81 mg by mouth daily., Disp: , Rfl:  .  atorvastatin (LIPITOR) 10 MG tablet, TAKE ONE TABLET BY MOUTH ONCE DAILY, Disp: 90 tablet, Rfl: 3 .  benazepril-hydrochlorthiazide (LOTENSIN HCT) 5-6.25 MG tablet, Take 1 tablet by mouth daily., Disp: 90 tablet, Rfl: 3 .  Cholecalciferol (VITAMIN D3) 1000 units CAPS, Take 1,000  Units by mouth daily., Disp: , Rfl:  .  clopidogrel (PLAVIX) 75 MG tablet, TAKE ONE TABLET BY MOUTH ONCE DAILY, Disp: 90 tablet, Rfl: 4 .  docusate sodium (COLACE) 100 MG capsule, Take 1 capsule (100 mg total) by mouth 2 (two) times daily., Disp: 60 capsule, Rfl: 0 .  finasteride (PROSCAR) 5 MG tablet, Take 1 tablet (5 mg total) by mouth daily., Disp: 90 tablet, Rfl: 3 .  isosorbide dinitrate (ISORDIL) 30 MG tablet, Take 1 tablet (30 mg total) by mouth every morning., Disp: 90 tablet, Rfl: 4 .  Magnesium 250 MG TABS, Take 250 mg by mouth daily., Disp: , Rfl:  .  metoprolol succinate (TOPROL-XL) 50 MG 24 hr tablet, Take 1 tablet (50 mg total) by mouth daily. Take with or immediately following a meal., Disp: 90 tablet, Rfl: 3 .  Mirabegron (MYRBETRIQ PO), Take by mouth., Disp: , Rfl:  .  Multiple Vitamins-Minerals (MULTIVITAMIN ADULT PO), Take by mouth daily., Disp: , Rfl:  .  Multiple Vitamins-Minerals (VISION FORMULA 2 PO), Take by mouth daily., Disp: , Rfl:  .  nitroGLYCERIN (NITROSTAT) 0.4 MG SL tablet, Place 0.4 mg under the tongue every 5 (five) minutes as needed for chest pain. Reported on 03/10/2016, Disp: , Rfl:  .  omeprazole (PRILOSEC) 40 MG capsule, TAKE ONE CAPSULE BY MOUTH TWICE DAILY, Disp: 180 capsule, Rfl: 4 .  psyllium (METAMUCIL) 58.6 % packet, Take 1 packet by mouth daily., Disp: , Rfl:  .  ranitidine (ZANTAC) 150 MG tablet, TAKE ONE  TABLET BY MOUTH TWICE DAILY, Disp: 180 tablet, Rfl: 4 .  sucralfate (CARAFATE) 1 g tablet, Take 1 tablet (1 g total) by mouth 2 (two) times daily as needed (heartburn or refllux)., Disp: 60 tablet, Rfl: 12 .  sucralfate (CARAFATE) 1 g tablet, Take 1 tablet (1 g total) by mouth 2 (two) times daily., Disp: 24 tablet, Rfl: 0 .  tamsulosin (FLOMAX) 0.4 MG CAPS capsule, Take 0.4 mg by mouth daily., Disp: , Rfl:  .  temazepam (RESTORIL) 30 MG capsule, take 1 capsule by mouth at bedtime if needed, Disp: 90 capsule, Rfl: 1 .  zolpidem (AMBIEN) 5 MG tablet,  TAKE 1 TABLET BY MOUTH AT BEDTIME AS NEEDED FOR SLEEP, Disp: 30 tablet, Rfl: 4  Current Facility-Administered Medications:  .  lidocaine (XYLOCAINE) 2 % jelly 1 application, 1 application, Urethral, Once, Collier Flowers, MD  Review of Systems  Constitutional: Negative.  Negative for fever and weight loss.  Cardiovascular: Negative for chest pain.  Gastrointestinal: Negative.  Negative for abdominal pain and bowel incontinence.  Genitourinary: Negative for bladder incontinence, dysuria and pelvic pain.  Musculoskeletal: Positive for back pain.  Neurological: Negative for dizziness, tingling, weakness, light-headedness, numbness, headaches and paresthesias.    Social History   Tobacco Use  . Smoking status: Former Smoker    Packs/day: 1.50    Years: 12.00    Pack years: 18.00    Types: Cigarettes    Last attempt to quit: 08/24/1971    Years since quitting: 46.7  . Smokeless tobacco: Never Used  Substance Use Topics  . Alcohol use: No    Alcohol/week: 0.0 standard drinks   Objective:   BP 114/62 (BP Location: Right Arm, Patient Position: Sitting, Cuff Size: Normal)   Pulse 88   Temp 98 F (36.7 C) (Oral)   Wt 243 lb (110.2 kg)   SpO2 98%   BMI 32.06 kg/m  Vitals:   05/10/18 1435  BP: 114/62  Pulse: 88  Temp: 98 F (36.7 C)  TempSrc: Oral  SpO2: 98%  Weight: 243 lb (110.2 kg)     Physical Exam  General appearance: alert, well developed, well nourished, cooperative and in no distress Head: Normocephalic, without obvious abnormality, atraumatic Respiratory: Respirations even and unlabored, normal respiratory rate Extremities: No gross deformities. +5 MS both LEs. Normal somatosensation MS: tender right para lumbar muscles. No swelling or erythema.       Assessment & Plan:     1. Sciatica of left side  - predniSONE (DELTASONE) 10 MG tablet; 6 tablets for 2 days, then 5 for 2 days, then 4 for 2 days, then 3 for 2 days, then 2 for 2 days, then 1 for 2 days.   Dispense: 42 tablet; Refill: 0  Call if symptoms change or if not rapidly improving.          Lelon Huh, MD  [facility]

## 2018-05-16 DIAGNOSIS — H353132 Nonexudative age-related macular degeneration, bilateral, intermediate dry stage: Secondary | ICD-10-CM | POA: Diagnosis not present

## 2018-05-30 ENCOUNTER — Other Ambulatory Visit: Payer: Self-pay | Admitting: Urology

## 2018-06-13 ENCOUNTER — Other Ambulatory Visit: Payer: Self-pay | Admitting: Family Medicine

## 2018-07-14 ENCOUNTER — Encounter: Payer: Self-pay | Admitting: Family Medicine

## 2018-07-14 ENCOUNTER — Telehealth: Payer: Self-pay | Admitting: Family Medicine

## 2018-07-14 ENCOUNTER — Ambulatory Visit (INDEPENDENT_AMBULATORY_CARE_PROVIDER_SITE_OTHER): Payer: PPO | Admitting: Family Medicine

## 2018-07-14 VITALS — BP 112/58 | HR 87 | Temp 97.9°F | Resp 16 | Ht 73.0 in | Wt 245.0 lb

## 2018-07-14 DIAGNOSIS — Z23 Encounter for immunization: Secondary | ICD-10-CM | POA: Diagnosis not present

## 2018-07-14 DIAGNOSIS — E785 Hyperlipidemia, unspecified: Secondary | ICD-10-CM | POA: Diagnosis not present

## 2018-07-14 DIAGNOSIS — N390 Urinary tract infection, site not specified: Secondary | ICD-10-CM

## 2018-07-14 DIAGNOSIS — I10 Essential (primary) hypertension: Secondary | ICD-10-CM | POA: Diagnosis not present

## 2018-07-14 LAB — POCT URINALYSIS DIPSTICK
BILIRUBIN UA: NEGATIVE
Glucose, UA: NEGATIVE
KETONES UA: NEGATIVE
Nitrite, UA: POSITIVE
PH UA: 6.5 (ref 5.0–8.0)
Protein, UA: POSITIVE — AB
SPEC GRAV UA: 1.015 (ref 1.010–1.025)
UROBILINOGEN UA: 0.2 U/dL

## 2018-07-14 MED ORDER — LEVOFLOXACIN 750 MG PO TABS: 750.0000 mg | ORAL_TABLET | Freq: Every day | ORAL | 0 refills | Status: AC

## 2018-07-14 NOTE — Telephone Encounter (Signed)
I called patient and advised him as below. Patient was not at home when I called and he states he would call us back to review his medication bottles over the phone later today.

## 2018-07-14 NOTE — Telephone Encounter (Signed)
Patient called back stating that he has checked all of his medications, and he is not taking plain HCTZ. He is taking the combination Benazepril-HCTZ. I advised patient that he needs to follow up  On blood pressure in 1 month. Patient refused and says that he would rather just follow up in 6 months as scheduled. I advised patient to be sure to bring all of his medication to his next follow up appointment.

## 2018-07-14 NOTE — Patient Instructions (Addendum)
   Take OTC Metamucil powder every day   Take OTC Miralax (polyethylene glycol) 2 to 3 days a week as needed for constipation

## 2018-07-14 NOTE — Telephone Encounter (Signed)
I called patient back. I advised him to actually check his medication bottles (he was previously going by a written list). After going through his medication bottles, patient realized that he was actually taking both Benazepril-hctz and HCTZ 25mg . I advised patient to stop HCTZ 25mg . Patient verbally voiced understanding. 1 month follow up appointment scheduled 08/11/2018 at 8:20am.

## 2018-07-14 NOTE — Progress Notes (Signed)
Patient: Bryan Nelson Male    DOB: August 30, 1931   84 y.o.   MRN: [id number] Visit Date: 07/14/2018  Today's Provider: Lelon Huh, MD   Chief Complaint  Patient presents with  . Hypertension  . Hyperlipidemia   Subjective:    HPI  Hypertension, follow-up:  BP Readings from Last 3 Encounters:  07/14/18 (!) 112/58  05/10/18 114/62  04/18/18 105/63    He was last seen for hypertension 6 months ago.  BP at that visit was 92/50. Management since that visit includes no changes. He reports good compliance with treatment. He is not having side effects.  He is exercising. He is adherent to low salt diet.   Outside blood pressures are checked occasionally. He is experiencing none.  Patient denies chest pain, chest pressure/discomfort, claudication, dyspnea, exertional chest pressure/discomfort, fatigue, irregular heart beat, lower extremity edema, near-syncope, orthopnea, palpitations, paroxysmal nocturnal dyspnea, syncope and tachypnea.   Cardiovascular risk factors include advanced age (older than 66 for men, 34 for women), hypertension and male gender.  Use of agents associated with hypertension: NSAIDS.     Weight trend: fluctuating a bit Wt Readings from Last 3 Encounters:  07/14/18 245 lb (111.1 kg)  05/10/18 243 lb (110.2 kg)  04/18/18 247 lb (112 kg)    Current diet: well balanced  ------------------------------------------------------------------------  Lipid/Cholesterol, Follow-up:   Last seen for this 6 months ago.  Management changes since that visit include none. . Last Lipid Panel:    Component Value Date/Time   CHOL 147 11/22/2016   TRIG 196 (A) 11/22/2016   HDL 39 11/22/2016   [place] 69 11/22/2016    Risk factors for vascular disease include hypercholesterolemia and hypertension  He reports good compliance with treatment. He is not having side effects.  Current symptoms include none and have been stable. Weight trend: fluctuating a  bit Prior visit with dietician: no Current diet: well balanced Current exercise: walking  Wt Readings from Last 3 Encounters:  07/14/18 245 lb (111.1 kg)  05/10/18 243 lb (110.2 kg)  04/18/18 247 lb (112 kg)    ------------------------------------------------------------------- Dysuria: Patient comes in today complaining of painful urination and nocturia for the past 3 days.   He also states he has trouble with constipation. States he does take OTC metamucil and OTC polyethylene glycol, but after taking for a few days will have perfuse diarrhea. Denies any blood in stool or abdominal pains.     Allergies  Allergen Reactions  . Ciprofloxacin Diarrhea  . Lisinopril Other (See Comments)    Unknown   . Penicillins Swelling    unknwon reaction.  tolerates amoxicillin Has patient had a PCN reaction causing immediate rash, facial/tongue/throat swelling, SOB or lightheadedness with hypotension: No Has patient had a PCN reaction causing severe rash involving mucus membranes or skin necrosis: No Has patient had a PCN reaction that required hospitalization: No Has patient had a PCN reaction occurring within the last 10 years: Yes If all of the above answers are "NO", then may proceed with Cephalosporin use.   . Sulfa Antibiotics Itching and Rash     Current Outpatient Medications:  .  aspirin 81 MG tablet, Take 81 mg by mouth daily., Disp: , Rfl:  .  atorvastatin (LIPITOR) 10 MG tablet, TAKE ONE TABLET BY MOUTH ONCE DAILY, Disp: 90 tablet, Rfl: 3 .  benazepril-hydrochlorthiazide (LOTENSIN HCT) 5-6.25 MG tablet, Take 1 tablet by mouth daily., Disp: 90 tablet, Rfl: 3 .  Cholecalciferol (VITAMIN D3) 1000  units CAPS, Take 1,000 Units by mouth daily., Disp: , Rfl:  .  clopidogrel (PLAVIX) 75 MG tablet, TAKE ONE TABLET BY MOUTH ONCE DAILY, Disp: 90 tablet, Rfl: 4 .  docusate sodium (COLACE) 100 MG capsule, Take 1 capsule (100 mg total) by mouth 2 (two) times daily., Disp: 60 capsule, Rfl:  0 .  finasteride (PROSCAR) 5 MG tablet, Take 1 tablet (5 mg total) by mouth daily., Disp: 90 tablet, Rfl: 3 .  isosorbide dinitrate (ISORDIL) 30 MG tablet, Take 1 tablet (30 mg total) by mouth every morning., Disp: 90 tablet, Rfl: 4 .  Magnesium 250 MG TABS, Take 250 mg by mouth daily., Disp: , Rfl:  .  metoprolol succinate (TOPROL-XL) 50 MG 24 hr tablet, Take 1 tablet (50 mg total) by mouth daily. Take with or immediately following a meal., Disp: 90 tablet, Rfl: 3 .  Multiple Vitamins-Minerals (MULTIVITAMIN ADULT PO), Take by mouth daily., Disp: , Rfl:  .  Multiple Vitamins-Minerals (VISION FORMULA 2 PO), Take by mouth daily., Disp: , Rfl:  .  nitroGLYCERIN (NITROSTAT) 0.4 MG SL tablet, Place 0.4 mg under the tongue every 5 (five) minutes as needed for chest pain. Reported on 03/10/2016, Disp: , Rfl:  .  omeprazole (PRILOSEC) 40 MG capsule, TAKE ONE CAPSULE BY MOUTH TWICE DAILY, Disp: 180 capsule, Rfl: 4 .  psyllium (METAMUCIL) 58.6 % packet, Take 1 packet by mouth daily., Disp: , Rfl:  .  ranitidine (ZANTAC) 150 MG tablet, TAKE ONE TABLET BY MOUTH TWICE DAILY, Disp: 180 tablet, Rfl: 4 .  sucralfate (CARAFATE) 1 g tablet, Take 1 tablet (1 g total) by mouth 2 (two) times daily as needed (heartburn or refllux)., Disp: 60 tablet, Rfl: 12 .  tamsulosin (FLOMAX) 0.4 MG CAPS capsule, TAKE 1 CAPSULE BY MOUTH ONCE DAILY, Disp: 90 capsule, Rfl: 3 .  temazepam (RESTORIL) 30 MG capsule, TAKE 1 CAPSULE BY MOUTH AT BEDTIME AS NEEDED, Disp: 90 capsule, Rfl: 1 .  zolpidem (AMBIEN) 5 MG tablet, TAKE 1 TABLET BY MOUTH AT BEDTIME AS NEEDED FOR SLEEP, Disp: 30 tablet, Rfl: 4 .  Mirabegron (MYRBETRIQ PO), Take by mouth., Disp: , Rfl:  .  tamsulosin (FLOMAX) 0.4 MG CAPS capsule, Take 0.4 mg by mouth daily., Disp: , Rfl:   Review of Systems  Constitutional: Negative for appetite change, chills and fever.  Respiratory: Negative for chest tightness, shortness of breath and wheezing.   Cardiovascular: Negative for chest  pain and palpitations.  Gastrointestinal: Positive for constipation and diarrhea. Negative for abdominal pain, nausea and vomiting.  Endocrine: Positive for polyuria.  Genitourinary: Positive for dysuria.    Social History   Tobacco Use  . Smoking status: Former Smoker    Packs/day: 1.50    Years: 12.00    Pack years: 18.00    Types: Cigarettes    Last attempt to quit: 08/24/1971    Years since quitting: 46.9  . Smokeless tobacco: Never Used  Substance Use Topics  . Alcohol use: No    Alcohol/week: 0.0 standard drinks   Objective:   BP (!) 112/58 (BP Location: Left Arm, Patient Position: Sitting)   Pulse 87   Temp 97.9 F (36.6 C) (Oral)   Resp 16   Ht 6\' 1"  (1.854 m)   Wt 245 lb (111.1 kg)   SpO2 96% Comment: room air  BMI 32.32 kg/m  Vitals:   07/14/18 0808  BP: (!) 112/58  Pulse: 87  Resp: 16  Temp: 97.9 F (36.6 C)  TempSrc: Oral  SpO2: 96%  Weight: 245 lb (111.1 kg)  Height: 6\' 1"  (1.854 m)     Physical Exam   General Appearance:    Alert, cooperative, no distress  Eyes:    PERRL, conjunctiva/corneas clear, EOM's intact       Lungs:     Clear to auscultation bilaterally, respirations unlabored  Heart:    Regular rate and rhythm  Neurologic:   Awake, alert, oriented x 3. No apparent focal neurological           defect.       Results for orders placed or performed in visit on 07/14/18  POCT Urinalysis Dipstick  Result Value Ref Range   Color, UA yellow    Clarity, UA cloudy    Glucose, UA Negative Negative   Bilirubin, UA negative    Ketones, UA negative    Spec Grav, UA 1.015 1.010 - 1.025   Blood, UA Moderate (non-hemolyzed)    pH, UA 6.5 5.0 - 8.0   Protein, UA Positive (A) Negative   Urobilinogen, UA 0.2 0.2 or 1.0 E.U./dL   Nitrite, UA positive    Leukocytes, UA Moderate (2+) (A) Negative   Appearance     Odor          Assessment & Plan:    . 1. Urinary tract infection without hematuria, site unspecified  - POCT Urinalysis  Dipstick - Urine Culture - levofloxacin (LEVAQUIN) 750 MG tablet; Take 1 tablet (750 mg total) by mouth daily for 7 days.  Dispense: 7 tablet; Refill: 0  2. Essential hypertension Well controlled.  Continue current medications.    3. Hyperlipidemia, unspecified hyperlipidemia type .aotrva   4. Need for pneumococcal vaccination  - Pneumococcal conjugate vaccine 13-valent       Lelon Huh, MD  [facility]

## 2018-07-14 NOTE — Telephone Encounter (Signed)
Patient was seen this morning for hypertension, but I found a discrepancy in his medication list. We have a medication dispense report from [facility] showing that they are continuing to dispense hctz 25mg  a day. However this was supposed to have been discontinued in February when we started him on benezepril-hctz .  Please have patient check his medication and see if he is still taking hctz 25mg . If so then he needs to stop taking it and follow up in a month to check his blood pressure. Please bring all medications to every office visit.

## 2018-07-17 ENCOUNTER — Telehealth: Payer: Self-pay

## 2018-07-17 LAB — URINE CULTURE

## 2018-07-17 NOTE — Telephone Encounter (Signed)
-----   Message from [name], MD sent at 07/17/2018  7:59 AM EST ----- Urine culture shows infection sensitive to antibiotic that was prescribed. Symptoms should completely resolve by the time antibiotic is finished. Call back otherwise.

## 2018-07-17 NOTE — Telephone Encounter (Signed)
Patient advised as below.  

## 2018-07-26 ENCOUNTER — Telehealth: Payer: Self-pay | Admitting: Family Medicine

## 2018-07-26 MED ORDER — FAMOTIDINE 20 MG PO TABS: 20.0000 mg | ORAL_TABLET | Freq: Two times a day (BID) | ORAL | 3 refills | Status: DC

## 2018-07-26 NOTE — Telephone Encounter (Signed)
Spoke with pt.  He needs to have ranitidine changed to a different medication.  Please send to [place].  Thanks,   -Mickel Baas

## 2018-07-26 NOTE — Telephone Encounter (Signed)
Patient walked in stating pharmacy needed to request a change in medication because of recall. He uses [facility] on [address]. Patient also wants to verify that he needs to stop taking medication.

## 2018-07-26 NOTE — Telephone Encounter (Signed)
Changed to famotidine

## 2018-07-26 NOTE — Addendum Note (Signed)
Addended by: [name] on: 07/26/2018 04:45 PM   Modules accepted: Orders

## 2018-08-08 ENCOUNTER — Encounter: Payer: Self-pay | Admitting: Family Medicine

## 2018-08-08 ENCOUNTER — Ambulatory Visit (INDEPENDENT_AMBULATORY_CARE_PROVIDER_SITE_OTHER): Payer: PPO | Admitting: Family Medicine

## 2018-08-08 VITALS — BP 106/58 | HR 76 | Temp 98.0°F | Resp 16 | Wt 248.0 lb

## 2018-08-08 DIAGNOSIS — I10 Essential (primary) hypertension: Secondary | ICD-10-CM

## 2018-08-08 DIAGNOSIS — R1032 Left lower quadrant pain: Secondary | ICD-10-CM

## 2018-08-08 DIAGNOSIS — E785 Hyperlipidemia, unspecified: Secondary | ICD-10-CM | POA: Diagnosis not present

## 2018-08-08 NOTE — Progress Notes (Signed)
Patient: Bryan Nelson Male    DOB: 12-Sep-1931   84 y.o.   MRN: [id number] Visit Date: 08/08/2018  Today's Provider: Lelon Huh, MD   Chief Complaint  Patient presents with  . Hypertension   Subjective:     Hypertension  This is a chronic problem. The problem is controlled (Bp pressures at are generally 120's/60's. ). Pertinent negatives include no headaches.  Abdominal Pain  This is a new problem. The current episode started in the past 7 days. The problem occurs daily (Pt states the pain gets worse as the day goes on.). The problem has been unchanged. The pain is located in the LLQ. The quality of the pain is dull. The abdominal pain does not radiate. Pertinent negatives include no anorexia, arthralgias, belching, constipation, diarrhea, dysuria, fever, frequency, headaches, hematuria, myalgias, nausea, vomiting or weight loss. Exacerbated by: Lifting. He has tried acetaminophen for the symptoms. The treatment provided no relief.   He had been taking both benazepril and hctz, which has been discontinued since his last visit.   Allergies  Allergen Reactions  . Ciprofloxacin Diarrhea  . Lisinopril Other (See Comments)    Unknown   . Penicillins Swelling    unknwon reaction.  tolerates amoxicillin Has patient had a PCN reaction causing immediate rash, facial/tongue/throat swelling, SOB or lightheadedness with hypotension: No Has patient had a PCN reaction causing severe rash involving mucus membranes or skin necrosis: No Has patient had a PCN reaction that required hospitalization: No Has patient had a PCN reaction occurring within the last 10 years: Yes If all of the above answers are "NO", then may proceed with Cephalosporin use.   . Sulfa Antibiotics Itching and Rash     Current Outpatient Medications:  .  aspirin 81 MG tablet, Take 81 mg by mouth daily., Disp: , Rfl:  .  atorvastatin (LIPITOR) 10 MG tablet, TAKE ONE TABLET BY MOUTH ONCE DAILY, Disp: 90 tablet,  Rfl: 3 .  benazepril-hydrochlorthiazide (LOTENSIN HCT) 5-6.25 MG tablet, Take 1 tablet by mouth daily., Disp: 90 tablet, Rfl: 3 .  Cholecalciferol (VITAMIN D3) 1000 units CAPS, Take 1,000 Units by mouth daily., Disp: , Rfl:  .  clopidogrel (PLAVIX) 75 MG tablet, TAKE ONE TABLET BY MOUTH ONCE DAILY, Disp: 90 tablet, Rfl: 4 .  famotidine (PEPCID) 20 MG tablet, Take 1 tablet (20 mg total) by mouth 2 (two) times daily., Disp: 180 tablet, Rfl: 3 .  finasteride (PROSCAR) 5 MG tablet, Take 1 tablet (5 mg total) by mouth daily., Disp: 90 tablet, Rfl: 3 .  isosorbide dinitrate (ISORDIL) 30 MG tablet, Take 1 tablet (30 mg total) by mouth every morning., Disp: 90 tablet, Rfl: 4 .  Magnesium 250 MG TABS, Take 250 mg by mouth daily., Disp: , Rfl:  .  metoprolol succinate (TOPROL-XL) 50 MG 24 hr tablet, Take 1 tablet (50 mg total) by mouth daily. Take with or immediately following a meal. (Patient taking differently: Take 25 mg by mouth daily. Take with or immediately following a meal.), Disp: 90 tablet, Rfl: 3 .  Multiple Vitamins-Minerals (MULTIVITAMIN ADULT PO), Take by mouth daily., Disp: , Rfl:  .  Multiple Vitamins-Minerals (VISION FORMULA 2 PO), Take by mouth daily., Disp: , Rfl:  .  nitroGLYCERIN (NITROSTAT) 0.4 MG SL tablet, Place 0.4 mg under the tongue every 5 (five) minutes as needed for chest pain. Reported on 03/10/2016, Disp: , Rfl:  .  omeprazole (PRILOSEC) 40 MG capsule, TAKE ONE CAPSULE BY MOUTH TWICE  DAILY, Disp: 180 capsule, Rfl: 4 .  sucralfate (CARAFATE) 1 g tablet, Take 1 tablet (1 g total) by mouth 2 (two) times daily as needed (heartburn or refllux)., Disp: 60 tablet, Rfl: 12 .  tamsulosin (FLOMAX) 0.4 MG CAPS capsule, TAKE 1 CAPSULE BY MOUTH ONCE DAILY, Disp: 90 capsule, Rfl: 3 .  temazepam (RESTORIL) 30 MG capsule, TAKE 1 CAPSULE BY MOUTH AT BEDTIME AS NEEDED, Disp: 90 capsule, Rfl: 1 .  docusate sodium (COLACE) 100 MG capsule, Take 1 capsule (100 mg total) by mouth 2 (two) times daily.  (Patient not taking: Reported on 07/14/2018), Disp: 60 capsule, Rfl: 0 .  Mirabegron (MYRBETRIQ PO), Take by mouth., Disp: , Rfl:  .  psyllium (METAMUCIL) 58.6 % packet, Take 1 packet by mouth daily., Disp: , Rfl:  .  tamsulosin (FLOMAX) 0.4 MG CAPS capsule, Take 0.4 mg by mouth daily., Disp: , Rfl:  .  zolpidem (AMBIEN) 5 MG tablet, TAKE 1 TABLET BY MOUTH AT BEDTIME AS NEEDED FOR SLEEP (Patient not taking: Reported on 07/14/2018), Disp: 30 tablet, Rfl: 4  Review of Systems  Constitutional: Negative.  Negative for fever and weight loss.  Respiratory: Negative.   Cardiovascular: Negative.   Gastrointestinal: Positive for abdominal pain. Negative for abdominal distention, anal bleeding, anorexia, blood in stool, constipation, diarrhea, nausea, rectal pain and vomiting.  Genitourinary: Negative for dysuria, frequency and hematuria.  Musculoskeletal: Negative for arthralgias and myalgias.  Neurological: Negative for dizziness, light-headedness and headaches.    Social History   Tobacco Use  . Smoking status: Former Smoker    Packs/day: 1.50    Years: 12.00    Pack years: 18.00    Types: Cigarettes    Last attempt to quit: 08/24/1971    Years since quitting: 46.9  . Smokeless tobacco: Never Used  Substance Use Topics  . Alcohol use: No    Alcohol/week: 0.0 standard drinks      Objective:   BP (!) 106/58 (BP Location: Right Arm, Patient Position: Sitting, Cuff Size: Large)   Pulse 76   Temp 98 F (36.7 C) (Oral)   Resp 16   Wt 248 lb (112.5 kg)   BMI 32.72 kg/m  Vitals:   08/08/18 1451  BP: (!) 106/58  Pulse: 76  Resp: 16  Temp: 98 F (36.7 C)  TempSrc: Oral  Weight: 248 lb (112.5 kg)     Physical Exam  General Appearance:    Alert, cooperative, no distress  Eyes:    PERRL, conjunctiva/corneas clear, EOM's intact       Lungs:     Clear to auscultation bilaterally, respirations unlabored  Heart:    Regular rate and rhythm  Abdomen:   bowel sounds present and normal  in all 4 quadrants, soft, round, nontender or nondistended. No CVA tenderness      Assessment & Plan    1. Essential hypertension Well controlled off of hctz. Continue benazepril-hctz.   2. Hyperlipidemia, unspecified hyperlipidemia type He is tolerating atorvastatin well with no adverse effects.   - Comprehensive metabolic panel - CBC - Lipid panel  3. Left lower quadrant pain Pain is very mild and he reports not nearly as intense as previous episode of diverticulitis. No hernia was palpated. I suspect mild muscle strain of for sx to resolve over the next few weeks. He to call office otherwise.      Lelon Huh, MD  [facility]

## 2018-08-08 NOTE — Patient Instructions (Addendum)
.   Please go to the lab draw station in [address] on the second floor of [facility]   Call my office in the pain in your stomach gets worse or doesn't go away in the next week to 10 days

## 2018-08-10 DIAGNOSIS — E785 Hyperlipidemia, unspecified: Secondary | ICD-10-CM | POA: Diagnosis not present

## 2018-08-11 ENCOUNTER — Telehealth: Payer: Self-pay

## 2018-08-11 ENCOUNTER — Ambulatory Visit: Payer: Self-pay | Admitting: Family Medicine

## 2018-08-11 LAB — COMPREHENSIVE METABOLIC PANEL
A/G RATIO: 2 (ref 1.2–2.2)
ALT: 21 IU/L (ref 0–44)
AST: 17 IU/L (ref 0–40)
Albumin: 4.1 g/dL (ref 3.5–4.7)
Alkaline Phosphatase: 67 IU/L (ref 39–117)
BILIRUBIN TOTAL: 0.4 mg/dL (ref 0.0–1.2)
BUN/Creatinine Ratio: 16 (ref 10–24)
BUN: 18 mg/dL (ref 8–27)
CALCIUM: 9.1 mg/dL (ref 8.6–10.2)
CHLORIDE: 102 mmol/L (ref 96–106)
CO2: 22 mmol/L (ref 20–29)
Creatinine, Ser: 1.11 mg/dL (ref 0.76–1.27)
GFR calc non Af Amer: 61 mL/min/{1.73_m2} (ref >59)
GFR, EST AFRICAN AMERICAN: 70 mL/min/{1.73_m2} (ref >59)
GLOBULIN, TOTAL: 2.1 g/dL (ref 1.5–4.5)
Glucose: 93 mg/dL (ref 65–99)
POTASSIUM: 4.1 mmol/L (ref 3.5–5.2)
SODIUM: 139 mmol/L (ref 134–144)
Total Protein: 6.2 g/dL (ref 6.0–8.5)

## 2018-08-11 LAB — CBC
Hematocrit: 38.9 % (ref 37.5–51.0)
Hemoglobin: 13.1 g/dL (ref 13.0–17.7)
MCH: 29.7 pg (ref 26.6–33.0)
MCHC: 33.7 g/dL (ref 31.5–35.7)
MCV: 88 fL (ref 79–97)
PLATELETS: 240 10*3/uL (ref 150–450)
RBC: 4.41 x10E6/uL (ref 4.14–5.80)
RDW: 14.4 % (ref 12.3–15.4)
WBC: 4.8 10*3/uL (ref 3.4–10.8)

## 2018-08-11 LAB — LIPID PANEL
CHOLESTEROL TOTAL: 159 mg/dL (ref 100–199)
Chol/HDL Ratio: 3.9 ratio (ref 0.0–5.0)
HDL: 41 mg/dL (ref >39)
LDL Calculated: 91 mg/dL (ref 0–99)
Triglycerides: 135 mg/dL (ref 0–149)
VLDL Cholesterol Cal: 27 mg/dL (ref 5–40)

## 2018-08-11 NOTE — Telephone Encounter (Signed)
-----   Message from [name], MD sent at 08/11/2018  7:53 AM EST ----- Labs are all very good. Cholesterol well controlled at 159. Continue current medications.  Check yearly.

## 2018-08-11 NOTE — Telephone Encounter (Signed)
Patient has been advised. KW 

## 2018-08-28 ENCOUNTER — Ambulatory Visit (INDEPENDENT_AMBULATORY_CARE_PROVIDER_SITE_OTHER): Payer: PPO | Admitting: Family Medicine

## 2018-08-28 ENCOUNTER — Encounter: Payer: Self-pay | Admitting: Family Medicine

## 2018-08-28 VITALS — BP 144/76 | HR 83 | Ht 73.0 in | Wt 250.0 lb

## 2018-08-28 DIAGNOSIS — R350 Frequency of micturition: Secondary | ICD-10-CM

## 2018-08-28 LAB — URINALYSIS, COMPLETE
BILIRUBIN UA: NEGATIVE
GLUCOSE, UA: NEGATIVE
Ketones, UA: NEGATIVE
Nitrite, UA: NEGATIVE
Protein, UA: NEGATIVE
Specific Gravity, UA: <1.005 — ABNORMAL LOW (ref 1.005–1.030)
UUROB: 0.2 mg/dL (ref 0.2–1.0)
pH, UA: 6 (ref 5.0–7.5)

## 2018-08-28 LAB — MICROSCOPIC EXAMINATION: RBC, UA: NONE SEEN /hpf (ref 0–2)

## 2018-08-28 MED ORDER — SULFAMETHOXAZOLE-TRIMETHOPRIM 800-160 MG PO TABS: 1.0000 | ORAL_TABLET | Freq: Two times a day (BID) | ORAL | 0 refills | Status: DC

## 2018-08-28 NOTE — Progress Notes (Signed)
Patient presents today with urinary frequency. Symptoms began 3 days ago. He does self cath 2 times per day. Patient states e has not been on ABX or had any urological surgeries in the last 30 days. A urine was collected for UA, UCX. Dr. Diamantina Providence reviewed the UA and Bactrim was sent to pharmacy.

## 2018-08-29 ENCOUNTER — Telehealth: Payer: Self-pay | Admitting: Urology

## 2018-08-29 NOTE — Telephone Encounter (Signed)
Pt stopped by office and he is having a reaction to antibiotic he started yesterday.  He has taken 2 so far and has an itchy rash.  Please call pt and let him know if he can start a new antibiotic.

## 2018-08-29 NOTE — Telephone Encounter (Signed)
Patient called back and was not happy with this answer, he really wants something to take for the infection and said that amoxicillin has always worked for him in the past. He wants to know if he can have that? I told him that it was not recommended to start another ABX right after a break out from another ABX reaction. But he wants me to ask anyway.   Please advise  Sharyn Lull

## 2018-08-30 LAB — CULTURE, URINE COMPREHENSIVE

## 2018-08-31 ENCOUNTER — Telehealth: Payer: Self-pay

## 2018-08-31 MED ORDER — NITROFURANTOIN MONOHYD MACRO 100 MG PO CAPS: 100.0000 mg | ORAL_CAPSULE | Freq: Two times a day (BID) | ORAL | 0 refills | Status: DC

## 2018-08-31 NOTE — Telephone Encounter (Signed)
Patient aware of urine results and macrobid sent to [place].

## 2018-08-31 NOTE — Telephone Encounter (Signed)
-----   Message from Billey Co, MD sent at 08/31/2018  8:15 AM EST ----- Sounds like he had a rash with Bactrim. Lets do 7 days of Nitrofurantoin, 100mg  BID. Thanks  Nickolas Madrid, MD 08/31/2018

## 2018-09-13 ENCOUNTER — Encounter: Payer: Self-pay | Admitting: Family Medicine

## 2018-09-13 ENCOUNTER — Ambulatory Visit (INDEPENDENT_AMBULATORY_CARE_PROVIDER_SITE_OTHER): Payer: PPO | Admitting: Family Medicine

## 2018-09-13 ENCOUNTER — Ambulatory Visit (INDEPENDENT_AMBULATORY_CARE_PROVIDER_SITE_OTHER): Payer: PPO

## 2018-09-13 VITALS — BP 126/56 | HR 84 | Temp 98.4°F | Ht 73.0 in | Wt 253.0 lb

## 2018-09-13 VITALS — BP 126/56 | Temp 98.4°F | Ht 73.0 in | Wt 253.0 lb

## 2018-09-13 DIAGNOSIS — N3090 Cystitis, unspecified without hematuria: Secondary | ICD-10-CM

## 2018-09-13 DIAGNOSIS — Z0000 Encounter for general adult medical examination without abnormal findings: Secondary | ICD-10-CM

## 2018-09-13 LAB — POCT URINALYSIS DIPSTICK
Bilirubin, UA: NEGATIVE
Glucose, UA: NEGATIVE
Ketones, UA: NEGATIVE
Protein, UA: POSITIVE — AB
Spec Grav, UA: 1.015 (ref 1.010–1.025)
Urobilinogen, UA: 0.2 E.U./dL
pH, UA: 6.5 (ref 5.0–8.0)

## 2018-09-13 MED ORDER — FOSFOMYCIN TROMETHAMINE 3 G PO PACK: 3.0000 g | PACK | Freq: Once | ORAL | 0 refills | Status: DC

## 2018-09-13 NOTE — Patient Instructions (Signed)
Bryan Nelson , Thank you for taking time to come for your Medicare Wellness Visit. I appreciate your ongoing commitment to your health goals. Please review the following plan we discussed and let me know if I can assist you in the future.   Screening recommendations/referrals: Colonoscopy: No longer required.  Recommended yearly ophthalmology/optometry visit for glaucoma screening and checkup Recommended yearly dental visit for hygiene and checkup  Vaccinations: Influenza vaccine: Up to date Pneumococcal vaccine: Up to date, Pneumovax 23 due 07/15/19 Tdap vaccine: Up to date, due 08/2026 Shingles vaccine: Completed series    Advanced directives: Already on file.   Conditions/risks identified: Increase water intake; Obesity- recommend to continue diet plan of cutting back on bread consumption to help aid with weight loss.   Next appointment: 12/12/18 with Dr Caryn Section.   Preventive Care 69 Years and Older, Male Preventive care refers to lifestyle choices and visits with your health care provider that can promote health and wellness. What does preventive care include?  A yearly physical exam. This is also called an annual well check.  Dental exams once or twice a year.  Routine eye exams. Ask your health care provider how often you should have your eyes checked.  Personal lifestyle choices, including:  Daily care of your teeth and gums.  Regular physical activity.  Eating a healthy diet.  Avoiding tobacco and drug use.  Limiting alcohol use.  Practicing safe sex.  Taking low doses of aspirin every day.  Taking vitamin and mineral supplements as recommended by your health care provider. What happens during an annual well check? The services and screenings done by your health care provider during your annual well check will depend on your age, overall health, lifestyle risk factors, and family history of disease. Counseling  Your health care provider may ask you questions  about your:  Alcohol use.  Tobacco use.  Drug use.  Emotional well-being.  Home and relationship well-being.  Sexual activity.  Eating habits.  History of falls.  Memory and ability to understand (cognition).  Work and work Statistician. Screening  You may have the following tests or measurements:  Height, weight, and BMI.  Blood pressure.  Lipid and cholesterol levels. These may be checked every 5 years, or more frequently if you are over 36 years old.  Skin check.  Lung cancer screening. You may have this screening every year starting at age 39 if you have a 30-pack-year history of smoking and currently smoke or have quit within the past 15 years.  Fecal occult blood test (FOBT) of the stool. You may have this test every year starting at age 58.  Flexible sigmoidoscopy or colonoscopy. You may have a sigmoidoscopy every 5 years or a colonoscopy every 10 years starting at age 61.  Prostate cancer screening. Recommendations will vary depending on your family history and other risks.  Hepatitis C blood test.  Hepatitis B blood test.  Sexually transmitted disease (STD) testing.  Diabetes screening. This is done by checking your blood sugar (glucose) after you have not eaten for a while (fasting). You may have this done every 1-3 years.  Abdominal aortic aneurysm (AAA) screening. You may need this if you are a current or former smoker.  Osteoporosis. You may be screened starting at age 43 if you are at high risk. Talk with your health care provider about your test results, treatment options, and if necessary, the need for more tests. Vaccines  Your health care provider may recommend certain vaccines, such as:  Influenza vaccine. This is recommended every year.  Tetanus, diphtheria, and acellular pertussis (Tdap, Td) vaccine. You may need a Td booster every 10 years.  Zoster vaccine. You may need this after age 44.  Pneumococcal 13-valent conjugate (PCV13)  vaccine. One dose is recommended after age 64.  Pneumococcal polysaccharide (PPSV23) vaccine. One dose is recommended after age 59. Talk to your health care provider about which screenings and vaccines you need and how often you need them. This information is not intended to replace advice given to you by your health care provider. Make sure you discuss any questions you have with your health care provider. Document Released: 09/05/2015 Document Revised: 04/28/2016 Document Reviewed: 06/10/2015 Elsevier Interactive Patient Education  2017 [place] Prevention in the Home Falls can cause injuries. They can happen to people of all ages. There are many things you can do to make your home safe and to help prevent falls. What can I do on the outside of my home?  Regularly fix the edges of walkways and driveways and fix any cracks.  Remove anything that might make you trip as you walk through a door, such as a raised step or threshold.  Trim any bushes or trees on the path to your home.  Use bright outdoor lighting.  Clear any walking paths of anything that might make someone trip, such as rocks or tools.  Regularly check to see if handrails are loose or broken. Make sure that both sides of any steps have handrails.  Any raised decks and porches should have guardrails on the edges.  Have any leaves, snow, or ice cleared regularly.  Use sand or salt on walking paths during winter.  Clean up any spills in your garage right away. This includes oil or grease spills. What can I do in the bathroom?  Use night lights.  Install grab bars by the toilet and in the tub and shower. Do not use towel bars as grab bars.  Use non-skid mats or decals in the tub or shower.  If you need to sit down in the shower, use a plastic, non-slip stool.  Keep the floor dry. Clean up any water that spills on the floor as soon as it happens.  Remove soap buildup in the tub or shower  regularly.  Attach bath mats securely with double-sided non-slip rug tape.  Do not have throw rugs and other things on the floor that can make you trip. What can I do in the bedroom?  Use night lights.  Make sure that you have a light by your bed that is easy to reach.  Do not use any sheets or blankets that are too big for your bed. They should not hang down onto the floor.  Have a firm chair that has side arms. You can use this for support while you get dressed.  Do not have throw rugs and other things on the floor that can make you trip. What can I do in the kitchen?  Clean up any spills right away.  Avoid walking on wet floors.  Keep items that you use a lot in easy-to-reach places.  If you need to reach something above you, use a strong step stool that has a grab bar.  Keep electrical cords out of the way.  Do not use floor polish or wax that makes floors slippery. If you must use wax, use non-skid floor wax.  Do not have throw rugs and other things on the floor that  can make you trip. What can I do with my stairs?  Do not leave any items on the stairs.  Make sure that there are handrails on both sides of the stairs and use them. Fix handrails that are broken or loose. Make sure that handrails are as long as the stairways.  Check any carpeting to make sure that it is firmly attached to the stairs. Fix any carpet that is loose or worn.  Avoid having throw rugs at the top or bottom of the stairs. If you do have throw rugs, attach them to the floor with carpet tape.  Make sure that you have a light switch at the top of the stairs and the bottom of the stairs. If you do not have them, ask someone to add them for you. What else can I do to help prevent falls?  Wear shoes that:  Do not have high heels.  Have rubber bottoms.  Are comfortable and fit you well.  Are closed at the toe. Do not wear sandals.  If you use a stepladder:  Make sure that it is fully  opened. Do not climb a closed stepladder.  Make sure that both sides of the stepladder are locked into place.  Ask someone to hold it for you, if possible.  Clearly mark and make sure that you can see:  Any grab bars or handrails.  First and last steps.  Where the edge of each step is.  Use tools that help you move around (mobility aids) if they are needed. These include:  Canes.  Walkers.  Scooters.  Crutches.  Turn on the lights when you go into a dark area. Replace any light bulbs as soon as they burn out.  Set up your furniture so you have a clear path. Avoid moving your furniture around.  If any of your floors are uneven, fix them.  If there are any pets around you, be aware of where they are.  Review your medicines with your doctor. Some medicines can make you feel dizzy. This can increase your chance of falling. Ask your doctor what other things that you can do to help prevent falls. This information is not intended to replace advice given to you by your health care provider. Make sure you discuss any questions you have with your health care provider. Document Released: 06/05/2009 Document Revised: 01/15/2016 Document Reviewed: 09/13/2014 Elsevier Interactive Patient Education  2017 [employer].

## 2018-09-13 NOTE — Progress Notes (Signed)
Patient: Bryan Nelson Male    DOB: 24-Jul-1932   85 y.o.   MRN: [id number] Visit Date: 09/13/2018  Today's Provider: Lelon Huh, MD   Chief Complaint  Patient presents with  . Urinary Tract Infection  . Abdominal Pain   Subjective:     Urinary Tract Infection   This is a recurrent problem. The current episode started in the past 7 days. The problem has been gradually worsening. There has been no fever. Associated symptoms include frequency and urgency. Pertinent negatives include no flank pain, hematuria, nausea or vomiting.  Abdominal Pain  This is a recurrent problem. The pain is located in the LLQ. The quality of the pain is aching and dull. The abdominal pain does not radiate. Associated symptoms include frequency. Pertinent negatives include no constipation, diarrhea, dysuria, hematuria, nausea or vomiting. The pain is aggravated by eating and movement. He has tried nothing for the symptoms.   Was recently dx with UTI at urology  On 08/28/2018 with culture growing citrobacter. He was initially prescribed bactrim and broke out in rash. Was changed to 7 days nitrofurantoin. He states sx briefly resolved but returned soon after finishing antibiotic.   Allergies  Allergen Reactions  . Ciprofloxacin Diarrhea  . Lisinopril Other (See Comments)    Unknown   . Penicillins Swelling    unknwon reaction.  tolerates amoxicillin Has patient had a PCN reaction causing immediate rash, facial/tongue/throat swelling, SOB or lightheadedness with hypotension: No Has patient had a PCN reaction causing severe rash involving mucus membranes or skin necrosis: No Has patient had a PCN reaction that required hospitalization: No Has patient had a PCN reaction occurring within the last 10 years: Yes If all of the above answers are "NO", then may proceed with Cephalosporin use.   . Sulfa Antibiotics Itching and Rash     Current Outpatient Medications:  .  aspirin 81 MG tablet, Take 81 mg by  mouth daily., Disp: , Rfl:  .  atorvastatin (LIPITOR) 10 MG tablet, TAKE ONE TABLET BY MOUTH ONCE DAILY, Disp: 90 tablet, Rfl: 3 .  benazepril-hydrochlorthiazide (LOTENSIN HCT) 5-6.25 MG tablet, Take 1 tablet by mouth daily., Disp: 90 tablet, Rfl: 3 .  Cholecalciferol (VITAMIN D3) 1000 units CAPS, Take 1,000 Units by mouth daily., Disp: , Rfl:  .  clopidogrel (PLAVIX) 75 MG tablet, TAKE ONE TABLET BY MOUTH ONCE DAILY, Disp: 90 tablet, Rfl: 4 .  famotidine (PEPCID) 20 MG tablet, Take 1 tablet (20 mg total) by mouth 2 (two) times daily., Disp: 180 tablet, Rfl: 3 .  finasteride (PROSCAR) 5 MG tablet, Take 1 tablet (5 mg total) by mouth daily., Disp: 90 tablet, Rfl: 3 .  isosorbide dinitrate (ISORDIL) 30 MG tablet, Take 1 tablet (30 mg total) by mouth every morning., Disp: 90 tablet, Rfl: 4 .  Magnesium 250 MG TABS, Take 250 mg by mouth daily., Disp: , Rfl:  .  metoprolol succinate (TOPROL-XL) 50 MG 24 hr tablet, Take 1 tablet (50 mg total) by mouth daily. Take with or immediately following a meal. (Patient taking differently: Take 25 mg by mouth daily. Take with or immediately following a meal.), Disp: 90 tablet, Rfl: 3 .  Mirabegron (MYRBETRIQ PO), Take by mouth., Disp: , Rfl:  .  Multiple Vitamins-Minerals (MULTIVITAMIN ADULT PO), Take by mouth daily., Disp: , Rfl:  .  Multiple Vitamins-Minerals (VISION FORMULA 2 PO), Take by mouth daily., Disp: , Rfl:  .  nitroGLYCERIN (NITROSTAT) 0.4 MG SL tablet, Place  0.4 mg under the tongue every 5 (five) minutes as needed for chest pain. Reported on 03/10/2016, Disp: , Rfl:  .  omeprazole (PRILOSEC) 40 MG capsule, TAKE ONE CAPSULE BY MOUTH TWICE DAILY, Disp: 180 capsule, Rfl: 4 .  psyllium (METAMUCIL) 58.6 % packet, Take 1 packet by mouth daily., Disp: , Rfl:  .  sucralfate (CARAFATE) 1 g tablet, Take 1 tablet (1 g total) by mouth 2 (two) times daily as needed (heartburn or refllux)., Disp: 60 tablet, Rfl: 12 .  tamsulosin (FLOMAX) 0.4 MG CAPS capsule, TAKE 1  CAPSULE BY MOUTH ONCE DAILY, Disp: 90 capsule, Rfl: 3 .  temazepam (RESTORIL) 30 MG capsule, TAKE 1 CAPSULE BY MOUTH AT BEDTIME AS NEEDED, Disp: 90 capsule, Rfl: 1 .  docusate sodium (COLACE) 100 MG capsule, Take 1 capsule (100 mg total) by mouth 2 (two) times daily. (Patient not taking: Reported on 07/14/2018), Disp: 60 capsule, Rfl: 0 .  nitrofurantoin, macrocrystal-monohydrate, (MACROBID) 100 MG capsule, Take 1 capsule (100 mg total) by mouth 2 (two) times daily. (Patient not taking: Reported on 09/13/2018), Disp: 14 capsule, Rfl: 0 .  sulfamethoxazole-trimethoprim (BACTRIM DS,SEPTRA DS) 800-160 MG tablet, Take 1 tablet by mouth every 12 (twelve) hours. (Patient not taking: Reported on 09/13/2018), Disp: 14 tablet, Rfl: 0 .  zolpidem (AMBIEN) 5 MG tablet, TAKE 1 TABLET BY MOUTH AT BEDTIME AS NEEDED FOR SLEEP (Patient not taking: Reported on 07/14/2018), Disp: 30 tablet, Rfl: 4  Review of Systems  Constitutional: Negative.   Gastrointestinal: Positive for abdominal pain (Lower left). Negative for abdominal distention, anal bleeding, blood in stool, constipation, diarrhea, nausea, rectal pain and vomiting.  Genitourinary: Positive for frequency and urgency. Negative for decreased urine volume, difficulty urinating, discharge, dysuria, enuresis, flank pain, genital sores, hematuria, penile pain, penile swelling, scrotal swelling and testicular pain.    Social History   Tobacco Use  . Smoking status: Former Smoker    Packs/day: 1.50    Years: 12.00    Pack years: 18.00    Types: Cigarettes    Last attempt to quit: 08/24/1971    Years since quitting: 47.0  . Smokeless tobacco: Never Used  Substance Use Topics  . Alcohol use: No    Alcohol/week: 0.0 standard drinks      Objective:   BP (!) 126/56   Temp 98.4 F (36.9 C) (Oral)   Ht 6\' 1"  (1.854 m)   Wt 253 lb (114.8 kg)   BMI 33.38 kg/m  Vitals:   09/13/18 1346  BP: (!) 126/56  Temp: 98.4 F (36.9 C)  TempSrc: Oral  Weight: 253  lb (114.8 kg)  Height: 6\' 1"  (1.854 m)     Physical Exam  General Appearance:    Alert, cooperative, no distress  Eyes:    PERRL, conjunctiva/corneas clear, EOM's intact       Lungs:     Clear to auscultation bilaterally, respirations unlabored  Heart:    Regular rate and rhythm  Abdomen:   Bilateral suprapubic tenderness. No rebound our guarding.     Results for orders placed or performed in visit on 09/13/18  POCT urinalysis dipstick  Result Value Ref Range   Color, UA     Clarity, UA     Glucose, UA Negative Negative   Bilirubin, UA Negative    Ketones, UA Negative    Spec Grav, UA 1.015 1.010 - 1.025   Blood, UA Large    pH, UA 6.5 5.0 - 8.0   Protein, UA Positive (A) Negative  Urobilinogen, UA 0.2 0.2 or 1.0 E.U./dL   Nitrite, UA Postive    Leukocytes, UA Large (3+) (A) Negative   Appearance     Odor         Assessment & Plan    1. Cystitis Intolerant to sulfa and quinolones.  - CULTURE, URINE COMPREHENSIVE - POCT urinalysis dipstick - fosfomycin (MONUROL) 3 g PACK; Take 3 g by mouth once for 1 dose.  Dispense: 3 g; Refill: 0     Lelon Huh, MD  [facility]

## 2018-09-13 NOTE — Patient Instructions (Addendum)
Fosfomycin powder for oral solution What is this medicine? FOSFOMYCIN (fos foe MYE sin) is an antibiotic. It is used to treat bacterial infections of the urinary tract. This medicine may be used for other purposes; ask your health care provider or pharmacist if you have questions. COMMON BRAND NAME(S): Monurol What should I tell my health care provider before I take this medicine? They need to know if you have any of these conditions: -kidney disease -an unusual or allergic reaction to fosfomycin, other medicines, foods, dyes, or preservatives -pregnant or trying to get pregnant -breast-feeding How should I use this medicine? Take this medicine by mouth. Follow the directions on the prescription label. Mix the contents of package in 3 to 4 ounces (1/2 cup) of cold water, stir well and drink. Take this medicine with food or on an empty stomach. Do not take your medicine more often than directed. Talk to your pediatrician regarding the use of this medicine in children. Special care may be needed. Overdosage: If you think you have taken too much of this medicine contact a poison control center or emergency room at once. NOTE: This medicine is only for you. Do not share this medicine with others. What if I miss a dose? This does not apply. This medicine is taken as a one-time dose. What may interact with this medicine? -metoclopramide This list may not describe all possible interactions. Give your health care provider a list of all the medicines, herbs, non-prescription drugs, or dietary supplements you use. Also tell them if you smoke, drink alcohol, or use illegal drugs. Some items may interact with your medicine. What should I watch for while using this medicine? Tell your doctor or health care professional if your symptoms do not start to get better in 2 to 3 days or if they get worse. What side effects may I notice from receiving this medicine? Side effects that you should report to your  doctor or health care professional as soon as possible: -allergic reactions like skin rash, itching or hives, swelling of the face, lips, or tongue -breathing problems -changes in vision -fever, flu-like symptoms -unusually weak or tired -yellowing of eyes or skin Side effects that usually do not require medical attention (report to your doctor or health care professional if they continue or are bothersome): -diarrhea -dizziness -headache -runny nose -sore throat -stomach upset, nausea -vaginal itch or irritation This list may not describe all possible side effects. Call your doctor for medical advice about side effects. You may report side effects to FDA at 1-800-FDA-1088. Where should I keep my medicine? Keep out of the reach of children. Store at room temperature between 15 and 30 degrees C (59 and 86 degrees F). Keep container tightly closed. Throw away any unused medicine after the expiration date. NOTE: This sheet is a summary. It may not cover all possible information. If you have questions about this medicine, talk to your doctor, pharmacist, or health care provider.  2019 Elsevier/Gold Standard (2007-12-13 13:38:28) . Please bring all of your medications to every appointment so we can make sure that our medication list is the same as yours.

## 2018-09-13 NOTE — Progress Notes (Signed)
Subjective:   Bryan Nelson is a 85 y.o. male who presents for Medicare Annual/Subsequent preventive examination.  Review of Systems:  N/A  Cardiac Risk Factors include: advanced age (>63men, >73 women);dyslipidemia;hypertension;male gender;obesity (BMI >30kg/m2)     Objective:    Vitals: BP (!) 126/56 (BP Location: Right Arm)   Pulse 84   Temp 98.4 F (36.9 C) (Oral)   Ht 6\' 1"  (1.854 m)   Wt 253 lb (114.8 kg)   BMI 33.38 kg/m   Body mass index is 33.38 kg/m.  Advanced Directives 09/13/2018 09/07/2017 03/06/2017 03/02/2017 02/18/2017 12/08/2016 11/08/2016  Does Patient Have a Medical Advance Directive? Yes Yes Yes Yes Yes No;Yes Yes  Type of Paramedic of [place];Living will Living will;Healthcare Power of [place];Living will [place];Living will [place];Living will Living will  Does patient want to make changes to medical advance directive? - - No - Patient declined No - Patient declined No - Patient declined - -  Copy of [place] in Chart? Yes - validated most recent copy scanned in chart (See row information) No - copy requested No - copy requested No - copy requested - Yes -  Would patient like information on creating a medical advance directive? - - - - - - -    Tobacco Social History   Tobacco Use  Smoking Status Former Smoker  . Packs/day: 1.50  . Years: 12.00  . Pack years: 18.00  . Types: Cigarettes  . Last attempt to quit: 08/24/1971  . Years since quitting: 47.0  Smokeless Tobacco Never Used     Counseling given: Not Answered   Clinical Intake:  Pre-visit preparation completed: Yes  Pain : No/denies pain     Nutritional Status: BMI > 30  Obese Nutritional Risks: None Diabetes: No  How often do you need to have someone help you when you read instructions, pamphlets, or other written materials from your doctor or  pharmacy?: 1 - Never  Interpreter Needed?: No  Information entered by :: [facility], LPN  Past Medical History:  Diagnosis Date  . Arteriosclerosis of coronary artery 08/26/2011   Overview:      a.  1999 PCI of the mid LAD with stent.      b.  2002 Cath [facility]: EF 56%. RCA-distal 25/25%. Left main-50% ostial.  Left circumflex-25% OM2.  LAD-25% proximal.  75% mid.  25/25% distal. 75% D1.      c.  07/2011 PCI of RCA with DES.[facility]   . Bleeding gastrointestinal 08/26/2011   Overview:      a.  Chronic abdominal pain, present improving.      b.  Duodenitis and gastritis by EGD in 2000.      c.  Pylori in 2000, treated.      d.  Gastroesophageal reflux disease.      e.  Diverticular disease.    . BP (high blood pressure) 08/26/2011  . Cancer ([place]) 2015   Lymphoma  . Closed dislocation of right ankle 09/10/2016   Successfully reduced in ER 09/10/2016  . GERD (gastroesophageal reflux disease)   . Heart disease   . Helicobacter pylori infection 06/18/2015   by EGD RX 08/18/1999   . History of adenomatous polyp of colon 06/18/2015  . HOH (hard of hearing)    Bilateral Hearing  Aids  . Malleolar fracture (Right) 09/10/2016   Past Surgical History:  Procedure Laterality Date  .  ANGIOPLASTY    . CATARACT EXTRACTION    . COLONOSCOPY  2016  . CORONARY ANGIOPLASTY    . EYE SURGERY Bilateral    Cataract Extraction with IOL  . GREEN LIGHT LASER TURP (TRANSURETHRAL RESECTION OF PROSTATE N/A 04/16/2015   Procedure: GREEN LIGHT LASER TURP (TRANSURETHRAL RESECTION OF PROSTATE WITH BLADDER BIOPSY;  Surgeon: Collier Flowers, MD;  Location: [facility] ORS;  Service: Urology;  Laterality: N/A;  . heart stent placement     1998, 2000, 2012  . TRANSURETHRAL RESECTION OF PROSTATE N/A 03/02/2017   Procedure: TRANSURETHRAL RESECTION OF THE PROSTATE (TURP);  Surgeon: Hollice Espy, MD;  Location: [facility] ORS;  Service: Urology;  Laterality: N/A;   Family History  Problem Relation Age of Onset  . Osteoporosis Mother   .  Alzheimer's disease Father   . Kidney disease Neg Hx   . Prostate cancer Neg Hx   . Bladder Cancer Neg Hx   . Kidney cancer Neg Hx    Social History   Socioeconomic History  . Marital status: Widowed    Spouse name: Not on file  . Number of children: 0  . Years of education: Not on file  . Highest education level: 12th grade  Occupational History  . Occupation: retired  Scientific laboratory technician  . Financial resource strain: Not very hard  . Food insecurity:    Worry: Never true    Inability: Never true  . Transportation needs:    Medical: No    Non-medical: No  Tobacco Use  . Smoking status: Former Smoker    Packs/day: 1.50    Years: 12.00    Pack years: 18.00    Types: Cigarettes    Last attempt to quit: 08/24/1971    Years since quitting: 47.0  . Smokeless tobacco: Never Used  Substance and Sexual Activity  . Alcohol use: No    Alcohol/week: 0.0 standard drinks  . Drug use: No  . Sexual activity: Not on file  Lifestyle  . Physical activity:    Days per week: 0 days    Minutes per session: 0 min  . Stress: Not at all  Relationships  . Social connections:    Talks on phone: Patient refused    Gets together: Patient refused    Attends religious service: Patient refused    Active member of club or organization: Patient refused    Attends meetings of clubs or organizations: Patient refused    Relationship status: Patient refused  Other Topics Concern  . Not on file  Social History Narrative  . Not on file    Outpatient Encounter Medications as of 09/13/2018  Medication Sig  . aspirin 81 MG tablet Take 81 mg by mouth daily.  Marland Kitchen atorvastatin (LIPITOR) 10 MG tablet TAKE ONE TABLET BY MOUTH ONCE DAILY  . benazepril-hydrochlorthiazide (LOTENSIN HCT) 5-6.25 MG tablet Take 1 tablet by mouth daily.  . Cholecalciferol (VITAMIN D3) 1000 units CAPS Take 1,000 Units by mouth daily.  . clopidogrel (PLAVIX) 75 MG tablet TAKE ONE TABLET BY MOUTH ONCE DAILY  . famotidine (PEPCID) 20 MG  tablet Take 1 tablet (20 mg total) by mouth 2 (two) times daily.  . finasteride (PROSCAR) 5 MG tablet Take 1 tablet (5 mg total) by mouth daily.  . isosorbide dinitrate (ISORDIL) 30 MG tablet Take 1 tablet (30 mg total) by mouth every morning.  . metoprolol succinate (TOPROL-XL) 50 MG 24 hr tablet Take 1 tablet (50 mg total) by mouth daily. Take with or immediately following  a meal. (Patient taking differently: Take 25 mg by mouth daily. Take with or immediately following a meal.)  . Multiple Vitamins-Minerals (MULTIVITAMIN ADULT PO) Take by mouth daily.  . Multiple Vitamins-Minerals (VISION FORMULA 2 PO) Take by mouth daily.  . nitroGLYCERIN (NITROSTAT) 0.4 MG SL tablet Place 0.4 mg under the tongue every 5 (five) minutes as needed for chest pain. Reported on 03/10/2016  . omeprazole (PRILOSEC) 40 MG capsule TAKE ONE CAPSULE BY MOUTH TWICE DAILY  . psyllium (METAMUCIL) 58.6 % packet Take 1 packet by mouth daily.  . sucralfate (CARAFATE) 1 g tablet Take 1 tablet (1 g total) by mouth 2 (two) times daily as needed (heartburn or refllux).  . tamsulosin (FLOMAX) 0.4 MG CAPS capsule TAKE 1 CAPSULE BY MOUTH ONCE DAILY  . temazepam (RESTORIL) 30 MG capsule TAKE 1 CAPSULE BY MOUTH AT BEDTIME AS NEEDED  . [DISCONTINUED] tamsulosin (FLOMAX) 0.4 MG CAPS capsule Take 0.4 mg by mouth daily.  Marland Kitchen docusate sodium (COLACE) 100 MG capsule Take 1 capsule (100 mg total) by mouth 2 (two) times daily. (Patient not taking: Reported on 07/14/2018)  . Magnesium 250 MG TABS Take 250 mg by mouth daily.  . Mirabegron (MYRBETRIQ PO) Take by mouth.  . nitrofurantoin, macrocrystal-monohydrate, (MACROBID) 100 MG capsule Take 1 capsule (100 mg total) by mouth 2 (two) times daily. (Patient not taking: Reported on 09/13/2018)  . sulfamethoxazole-trimethoprim (BACTRIM DS,SEPTRA DS) 800-160 MG tablet Take 1 tablet by mouth every 12 (twelve) hours. (Patient not taking: Reported on 09/13/2018)  . zolpidem (AMBIEN) 5 MG tablet TAKE 1 TABLET  BY MOUTH AT BEDTIME AS NEEDED FOR SLEEP (Patient not taking: Reported on 07/14/2018)   No facility-administered encounter medications on file as of 09/13/2018.     Activities of Daily Living In your present state of health, do you have any difficulty performing the following activities: 09/13/2018  Hearing? Y  Comment Wears bilateral hearing aids.   Vision? N  Comment Wears eye glasses daily.   Difficulty concentrating or making decisions? N  Walking or climbing stairs? N  Dressing or bathing? N  Doing errands, shopping? N  Preparing Food and eating ? N  Using the Toilet? N  In the past six months, have you accidently leaked urine? N  Do you have problems with loss of bowel control? N  Managing your Medications? N  Managing your Finances? N  Housekeeping or managing your Housekeeping? N  Some recent data might be hidden    Patient Care Team: [name], MD as PCP - General (Family Medicine) Dingeldein, Remo Lipps, MD as Consulting Physician (Ophthalmology) Hollice Espy, MD as Consulting Physician (Urology) Yolonda Kida, MD as Consulting Physician (Cardiology) Laneta Simmers as Physician Assistant (Urology)   Assessment:   This is a routine wellness examination for Abdirahim.  Exercise Activities and Dietary recommendations Current Exercise Habits: Home exercise routine, Type of exercise: walking, Time (Minutes): 40, Frequency (Times/Week): 6, Weekly Exercise (Minutes/Week): 240, Intensity: Mild, Exercise limited by: None identified  Goals    . DIET - INCREASE WATER INTAKE     Recommend increasing water intake to 6-8 glasses a day.     Marland Kitchen DIET - REDUCE CARB INTAKE     Recommend to continue diet plan of cutting back on bread consumption to help aid with weight loss.     . Increase water intake     Recommend increasing water intake to 4-5 glasses a day.       Fall Risk Fall Risk  09/13/2018 09/07/2017 12/08/2016 08/25/2016 06/18/2015  Falls in the past year?  0 Yes Yes No No  Number falls in past yr: - 1 1 - -  Injury with Fall? - Yes Yes - -  Comment - broke right ankle on 09/11/16 broken right ankle - -  Follow up - Falls prevention discussed Falls prevention discussed - -   FALL RISK PREVENTION PERTAINING TO THE HOME:  Any stairs in or around the home WITH handrails? No  Home free of loose throw rugs in walkways, pet beds, electrical cords, etc? Yes  Adequate lighting in your home to reduce risk of falls? Yes   ASSISTIVE DEVICES UTILIZED TO PREVENT FALLS:  Life alert? No  Use of a cane, walker or w/c? No  Grab bars in the bathroom? Yes Shower chair or bench in shower? Yes  Elevated toilet seat or a handicapped toilet? Yes    TIMED UP AND GO:  Was the test performed? No .     Depression Screen PHQ 2/9 Scores 09/13/2018 09/13/2018 09/07/2017 12/08/2016  PHQ - 2 Score 0 0 0 0  PHQ- 9 Score 0 - - 0    Cognitive Function     6CIT Screen 09/13/2018 12/08/2016  What Year? 0 points 0 points  What month? 0 points 0 points  What time? 0 points 0 points  Count back from 20 0 points 0 points  Months in reverse 2 points 0 points  Repeat phrase 2 points 2 points  Total Score 4 2    Immunization History  Administered Date(s) Administered  . Influenza, High Dose Seasonal PF 06/18/2015, 06/01/2016, 05/12/2017, 05/31/2018  . Pneumococcal Conjugate-13 07/14/2018  . Tdap 09/11/2016  . Zoster Recombinat (Shingrix) 05/09/2018, 07/24/2018    Qualifies for Shingles Vaccine? Up to date  Tdap: Up to date  Flu Vaccine: Up to date  Pneumococcal Vaccine: Up to date   Screening Tests Health Maintenance  Topic Date Due  . PNA vac Low Risk Adult (2 of 2 - PPSV23) 07/15/2019  . TETANUS/TDAP  09/11/2026  . INFLUENZA VACCINE  Completed   Cancer Screenings:  Colorectal Screening: No longer required.   Lung Cancer Screening: (Low Dose CT Chest recommended if Age 73-80 years, 30 pack-year currently smoking OR have quit w/in 15years.) does  not qualify.    Additional Screening:  Vision Screening: Recommended annual ophthalmology exams for early detection of glaucoma and other disorders of the eye.  Dental Screening: Recommended annual dental exams for proper oral hygiene  Community Resource Referral:  CRR required this visit?  No      Plan:  I have personally reviewed and addressed the Medicare Annual Wellness questionnaire and have noted the following in the patient's chart:  A. Medical and social history B. Use of alcohol, tobacco or illicit drugs  C. Current medications and supplements D. Functional ability and status E.  Nutritional status F.  Physical activity G. Advance directives H. List of other physicians I.  Hospitalizations, surgeries, and ER visits in previous 12 months J.  [place] such as hearing and vision if needed, cognitive and depression L. Referrals and appointments - none  In addition, I have reviewed and discussed with patient certain preventive protocols, quality metrics, and best practice recommendations. A written personalized care plan for preventive services as well as general preventive health recommendations were provided to patient.  See attached scanned questionnaire for additional information.   Signed,  Fabio Neighbors, LPN Nurse Health Advisor   Nurse Recommendations: Please  f/u at next regarding urine frequency.

## 2018-09-15 ENCOUNTER — Telehealth: Payer: Self-pay | Admitting: Family Medicine

## 2018-09-15 NOTE — Telephone Encounter (Signed)
Pt advised.  He states he uses [facility] for went the sensitivities come back.   Thanks,   -Mickel Baas

## 2018-09-15 NOTE — Telephone Encounter (Signed)
He does have UTI, but antibiotic sensitivities are not complete. It might be tomorrow before they are in.

## 2018-09-15 NOTE — Telephone Encounter (Signed)
Pt needing a call back today regarding his results from his UTI urine testing.  Needing to know if he needs to start on an antibiotic.  Please call pt back today.  Thanks, [employer]

## 2018-09-16 LAB — SPECIMEN STATUS REPORT

## 2018-09-16 LAB — CULTURE, URINE COMPREHENSIVE

## 2018-09-18 ENCOUNTER — Telehealth: Payer: Self-pay | Admitting: Family Medicine

## 2018-09-18 MED ORDER — LEVOFLOXACIN 500 MG PO TABS: 500.0000 mg | ORAL_TABLET | Freq: Every day | ORAL | 0 refills | Status: DC

## 2018-09-18 NOTE — Telephone Encounter (Signed)
Patient advised as below. Patient reports that he did not get fosfomycin from last week due to cost. Patient is requesting a new antibiotic called into [facility]. [facility]

## 2018-09-18 NOTE — Telephone Encounter (Signed)
Patient is wanting urine culture results please.   He said someone was suppose to call him on Saturday w/ results but did not.

## 2018-09-18 NOTE — Telephone Encounter (Signed)
-----   Message from [name], MD sent at 09/18/2018 10:54 AM EST ----- Culture grew e.coli. the fosfomycin prescribed last week usually cures this. If his sx have resolved then no additional antibiotic is needed. If he he still has any urinary sx then can prescription levofloxacin 500mg  daily for 7 days.

## 2018-09-18 NOTE — Telephone Encounter (Signed)
Please see result note 

## 2018-09-25 ENCOUNTER — Encounter: Payer: Self-pay | Admitting: Family Medicine

## 2018-09-25 ENCOUNTER — Ambulatory Visit (INDEPENDENT_AMBULATORY_CARE_PROVIDER_SITE_OTHER): Payer: PPO | Admitting: Family Medicine

## 2018-09-25 VITALS — BP 118/68 | HR 84 | Temp 97.7°F | Resp 16 | Wt 251.0 lb

## 2018-09-25 DIAGNOSIS — N3090 Cystitis, unspecified without hematuria: Secondary | ICD-10-CM

## 2018-09-25 DIAGNOSIS — R1030 Lower abdominal pain, unspecified: Secondary | ICD-10-CM

## 2018-09-25 LAB — POCT URINALYSIS DIPSTICK
Bilirubin, UA: NEGATIVE
Glucose, UA: NEGATIVE
Ketones, UA: NEGATIVE
Nitrite, UA: NEGATIVE
PROTEIN UA: POSITIVE — AB
Spec Grav, UA: 1.025 (ref 1.010–1.025)
Urobilinogen, UA: 0.2 E.U./dL
pH, UA: 6 (ref 5.0–8.0)

## 2018-09-25 MED ORDER — CIPROFLOXACIN HCL 500 MG PO TABS: 500.0000 mg | ORAL_TABLET | Freq: Two times a day (BID) | ORAL | 0 refills | Status: AC

## 2018-09-25 MED ORDER — METRONIDAZOLE 500 MG PO TABS: 500.0000 mg | ORAL_TABLET | Freq: Two times a day (BID) | ORAL | 0 refills | Status: AC

## 2018-09-25 NOTE — Patient Instructions (Signed)
.   Please review the attached list of medications and notify my office if there are any errors.   . Please bring all of your medications to every appointment so we can make sure that our medication list is the same as yours.   

## 2018-09-25 NOTE — Progress Notes (Signed)
Patient: Bryan Nelson Male    DOB: 24-Oct-1931   85 y.o.   MRN: [id number] Visit Date: 09/25/2018  Today's Provider: Lelon Huh, MD   Chief Complaint  Patient presents with  . Abdominal Pain   Subjective:     Abdominal Pain  This is a new problem. Episode onset: 2 weeks ago. The problem occurs intermittently. The problem has been gradually worsening. The pain is located in the LLQ (left inguinal area). The pain is at a severity of 4/10. Quality: sorness. The abdominal pain does not radiate. Pertinent negatives include no fever, nausea or vomiting. Diverticulosis  he reports this is same pain he has been experiencing for off and on for several months, but different than UTI sx he was seen for on 09/13/2018, which have resolved.  He states pain doesn't bother him when up and around, but kept him up last night. He states he had diverticulitis many years ago and pain is similar. No fevers, chills, sweats, nausea or vomiting  Allergies  Allergen Reactions  . Ciprofloxacin Diarrhea  . Lisinopril Other (See Comments)    Unknown   . Penicillins Swelling    unknwon reaction.  tolerates amoxicillin Has patient had a PCN reaction causing immediate rash, facial/tongue/throat swelling, SOB or lightheadedness with hypotension: No Has patient had a PCN reaction causing severe rash involving mucus membranes or skin necrosis: No Has patient had a PCN reaction that required hospitalization: No Has patient had a PCN reaction occurring within the last 10 years: Yes If all of the above answers are "NO", then may proceed with Cephalosporin use.   . Bactrim [Sulfamethoxazole-Trimethoprim] Rash  . Sulfa Antibiotics Itching and Rash     Current Outpatient Medications:  .  aspirin 81 MG tablet, Take 81 mg by mouth daily., Disp: , Rfl:  .  atorvastatin (LIPITOR) 10 MG tablet, TAKE ONE TABLET BY MOUTH ONCE DAILY, Disp: 90 tablet, Rfl: 3 .  benazepril-hydrochlorthiazide (LOTENSIN HCT) 5-6.25 MG  tablet, Take 1 tablet by mouth daily., Disp: 90 tablet, Rfl: 3 .  Cholecalciferol (VITAMIN D3) 1000 units CAPS, Take 1,000 Units by mouth daily., Disp: , Rfl:  .  clopidogrel (PLAVIX) 75 MG tablet, TAKE ONE TABLET BY MOUTH ONCE DAILY, Disp: 90 tablet, Rfl: 4 .  docusate sodium (COLACE) 100 MG capsule, Take 1 capsule (100 mg total) by mouth 2 (two) times daily. (Patient not taking: Reported on 07/14/2018), Disp: 60 capsule, Rfl: 0 .  famotidine (PEPCID) 20 MG tablet, Take 1 tablet (20 mg total) by mouth 2 (two) times daily., Disp: 180 tablet, Rfl: 3 .  finasteride (PROSCAR) 5 MG tablet, Take 1 tablet (5 mg total) by mouth daily., Disp: 90 tablet, Rfl: 3 .  isosorbide dinitrate (ISORDIL) 30 MG tablet, Take 1 tablet (30 mg total) by mouth every morning., Disp: 90 tablet, Rfl: 4 .  Magnesium 250 MG TABS, Take 250 mg by mouth daily., Disp: , Rfl:  .  metoprolol succinate (TOPROL-XL) 50 MG 24 hr tablet, Take 1 tablet (50 mg total) by mouth daily. Take with or immediately following a meal. (Patient taking differently: Take 25 mg by mouth daily. Take with or immediately following a meal.), Disp: 90 tablet, Rfl: 3 .  Mirabegron (MYRBETRIQ PO), Take by mouth., Disp: , Rfl:  .  Multiple Vitamins-Minerals (MULTIVITAMIN ADULT PO), Take by mouth daily., Disp: , Rfl:  .  Multiple Vitamins-Minerals (VISION FORMULA 2 PO), Take by mouth daily., Disp: , Rfl:  .  nitroGLYCERIN (  NITROSTAT) 0.4 MG SL tablet, Place 0.4 mg under the tongue every 5 (five) minutes as needed for chest pain. Reported on 03/10/2016, Disp: , Rfl:  .  omeprazole (PRILOSEC) 40 MG capsule, TAKE ONE CAPSULE BY MOUTH TWICE DAILY, Disp: 180 capsule, Rfl: 4 .  psyllium (METAMUCIL) 58.6 % packet, Take 1 packet by mouth daily., Disp: , Rfl:  .  sucralfate (CARAFATE) 1 g tablet, Take 1 tablet (1 g total) by mouth 2 (two) times daily as needed (heartburn or refllux)., Disp: 60 tablet, Rfl: 12 .  tamsulosin (FLOMAX) 0.4 MG CAPS capsule, TAKE 1 CAPSULE BY  MOUTH ONCE DAILY, Disp: 90 capsule, Rfl: 3 .  temazepam (RESTORIL) 30 MG capsule, TAKE 1 CAPSULE BY MOUTH AT BEDTIME AS NEEDED, Disp: 90 capsule, Rfl: 1 .  zolpidem (AMBIEN) 5 MG tablet, TAKE 1 TABLET BY MOUTH AT BEDTIME AS NEEDED FOR SLEEP (Patient not taking: Reported on 07/14/2018), Disp: 30 tablet, Rfl: 4  Review of Systems  Constitutional: Negative for appetite change, chills and fever.  Respiratory: Negative for chest tightness, shortness of breath and wheezing.   Cardiovascular: Negative for chest pain and palpitations.  Gastrointestinal: Positive for abdominal pain. Negative for nausea and vomiting.    Social History   Tobacco Use  . Smoking status: Former Smoker    Packs/day: 1.50    Years: 12.00    Pack years: 18.00    Types: Cigarettes    Last attempt to quit: 08/24/1971    Years since quitting: 47.1  . Smokeless tobacco: Never Used  Substance Use Topics  . Alcohol use: No    Alcohol/week: 0.0 standard drinks      Objective:   BP 118/68 (BP Location: Left Arm, Patient Position: Sitting, Cuff Size: Large)   Pulse 84   Temp 97.7 F (36.5 C) (Oral)   Resp 16   Wt 251 lb (113.9 kg)   SpO2 97% Comment: room air  BMI 33.12 kg/m  Vitals:   09/25/18 0835  BP: 118/68  Pulse: 84  Resp: 16  Temp: 97.7 F (36.5 C)  TempSrc: Oral  SpO2: 97%  Weight: 251 lb (113.9 kg)     Physical Exam   General Appearance:    Alert, cooperative, no distress  Eyes:    PERRL, conjunctiva/corneas clear, EOM's intact       Lungs:     Clear to auscultation bilaterally, respirations unlabored  Heart:    Regular rate and rhythm  Abdomen:   bowel sounds present and normal in all 4 quadrants. No CVA tenderness    Results for orders placed or performed in visit on 09/25/18  POCT Urinalysis Dipstick  Result Value Ref Range   Color, UA yellow    Clarity, UA clear    Glucose, UA Negative Negative   Bilirubin, UA negative    Ketones, UA negative    Spec Grav, UA 1.025 1.010 - 1.025     Blood, UA Moderate (Hemolyzed)    pH, UA 6.0 5.0 - 8.0   Protein, UA Positive (A) Negative   Urobilinogen, UA 0.2 0.2 or 1.0 E.U./dL   Nitrite, UA negative    Leukocytes, UA Trace (A) Negative   Appearance     Odor         Assessment & Plan    1. Lower abdominal pain Symptoms chronic and intermittent, no typical for diverticulitis, mostly resolved this morning. Will cover for diverticulitis for now. Consider abdominal/pelvic CT is sx persist.  - ciprofloxacin (CIPRO) 500 MG tablet;  Take 1 tablet (500 mg total) by mouth 2 (two) times daily for 7 days.  Dispense: 14 tablet; Refill: 0 - metroNIDAZOLE (FLAGYL) 500 MG tablet; Take 1 tablet (500 mg total) by mouth 2 (two) times daily for 7 days.  Dispense: 14 tablet; Refill: 0  2. Cystitis Sx improved after dose of fosfomycin prescribed 09/13/2018 - POCT Urinalysis Dipstick - Urine Culture     Lelon Huh, MD  [facility]

## 2018-09-27 LAB — URINE CULTURE

## 2018-10-11 ENCOUNTER — Ambulatory Visit (INDEPENDENT_AMBULATORY_CARE_PROVIDER_SITE_OTHER): Payer: PPO | Admitting: Family Medicine

## 2018-10-11 ENCOUNTER — Encounter: Payer: Self-pay | Admitting: Family Medicine

## 2018-10-11 VITALS — BP 121/67 | HR 69 | Temp 98.0°F | Wt 253.0 lb

## 2018-10-11 DIAGNOSIS — R1030 Lower abdominal pain, unspecified: Secondary | ICD-10-CM | POA: Diagnosis not present

## 2018-10-11 DIAGNOSIS — C8330 Diffuse large B-cell lymphoma, unspecified site: Secondary | ICD-10-CM

## 2018-10-11 DIAGNOSIS — R1032 Left lower quadrant pain: Secondary | ICD-10-CM | POA: Diagnosis not present

## 2018-10-11 LAB — POCT URINALYSIS DIPSTICK
Bilirubin, UA: NEGATIVE
Glucose, UA: NEGATIVE
Ketones, UA: NEGATIVE
Nitrite, UA: NEGATIVE
Protein, UA: POSITIVE — AB
Spec Grav, UA: 1.015 (ref 1.010–1.025)
Urobilinogen, UA: 0.2 E.U./dL
pH, UA: 6 (ref 5.0–8.0)

## 2018-10-11 NOTE — Patient Instructions (Signed)
.   Please review the attached list of medications and notify my office if there are any errors.   . Please bring all of your medications to every appointment so we can make sure that our medication list is the same as yours.   

## 2018-10-11 NOTE — Progress Notes (Signed)
Patient: Bryan Nelson Male    DOB: 03/24/1932   85 y.o.   MRN: [id number] Visit Date: 10/11/2018  Today's Provider: Lelon Huh, MD   Chief Complaint  Patient presents with  . Abdominal Pain    Lower left started last week.    Subjective:     Abdominal Pain  This is a recurrent problem. The current episode started in the past 7 days. The pain is located in the LLQ. The abdominal pain does not radiate. Associated symptoms include constipation (Chronic issue). Pertinent negatives include no anorexia, diarrhea, fever, frequency, headaches, hematuria, myalgias, nausea, vomiting or weight loss. Exacerbated by: Pt reports the pain gets worse later in the day.  In the mornings he states he feels better in the mornings.  He was seen for this about two weeks ago when he thought it was a flare of diverticulitis. Took a round of metronidazole and ciprofloxacin with minimal improvement.   Allergies  Allergen Reactions  . Ciprofloxacin Diarrhea  . Lisinopril Other (See Comments)    Unknown   . Penicillins Swelling    unknwon reaction.  tolerates amoxicillin Has patient had a PCN reaction causing immediate rash, facial/tongue/throat swelling, SOB or lightheadedness with hypotension: No Has patient had a PCN reaction causing severe rash involving mucus membranes or skin necrosis: No Has patient had a PCN reaction that required hospitalization: No Has patient had a PCN reaction occurring within the last 10 years: Yes If all of the above answers are "NO", then may proceed with Cephalosporin use.   . Bactrim [Sulfamethoxazole-Trimethoprim] Rash  . Sulfa Antibiotics Itching and Rash     Current Outpatient Medications:  .  aspirin 81 MG tablet, Take 81 mg by mouth daily., Disp: , Rfl:  .  atorvastatin (LIPITOR) 10 MG tablet, TAKE ONE TABLET BY MOUTH ONCE DAILY, Disp: 90 tablet, Rfl: 3 .  benazepril-hydrochlorthiazide (LOTENSIN HCT) 5-6.25 MG tablet, Take 1 tablet by mouth daily., Disp:  90 tablet, Rfl: 3 .  Cholecalciferol (VITAMIN D3) 1000 units CAPS, Take 1,000 Units by mouth daily., Disp: , Rfl:  .  clopidogrel (PLAVIX) 75 MG tablet, TAKE ONE TABLET BY MOUTH ONCE DAILY, Disp: 90 tablet, Rfl: 4 .  famotidine (PEPCID) 20 MG tablet, Take 1 tablet (20 mg total) by mouth 2 (two) times daily., Disp: 180 tablet, Rfl: 3 .  finasteride (PROSCAR) 5 MG tablet, Take 1 tablet (5 mg total) by mouth daily., Disp: 90 tablet, Rfl: 3 .  isosorbide dinitrate (ISORDIL) 30 MG tablet, Take 1 tablet (30 mg total) by mouth every morning., Disp: 90 tablet, Rfl: 4 .  Magnesium 250 MG TABS, Take 250 mg by mouth daily., Disp: , Rfl:  .  metoprolol succinate (TOPROL-XL) 50 MG 24 hr tablet, Take 1 tablet (50 mg total) by mouth daily. Take with or immediately following a meal. (Patient taking differently: Take 25 mg by mouth daily. Take with or immediately following a meal.), Disp: 90 tablet, Rfl: 3 .  Multiple Vitamins-Minerals (MULTIVITAMIN ADULT PO), Take by mouth daily., Disp: , Rfl:  .  Multiple Vitamins-Minerals (VISION FORMULA 2 PO), Take by mouth daily., Disp: , Rfl:  .  nitroGLYCERIN (NITROSTAT) 0.4 MG SL tablet, Place 0.4 mg under the tongue every 5 (five) minutes as needed for chest pain. Reported on 03/10/2016, Disp: , Rfl:  .  omeprazole (PRILOSEC) 40 MG capsule, TAKE ONE CAPSULE BY MOUTH TWICE DAILY, Disp: 180 capsule, Rfl: 4 .  psyllium (METAMUCIL) 58.6 % packet, Take  1 packet by mouth daily., Disp: , Rfl:  .  sucralfate (CARAFATE) 1 g tablet, Take 1 tablet (1 g total) by mouth 2 (two) times daily as needed (heartburn or refllux)., Disp: 60 tablet, Rfl: 12 .  tamsulosin (FLOMAX) 0.4 MG CAPS capsule, TAKE 1 CAPSULE BY MOUTH ONCE DAILY, Disp: 90 capsule, Rfl: 3 .  temazepam (RESTORIL) 30 MG capsule, TAKE 1 CAPSULE BY MOUTH AT BEDTIME AS NEEDED, Disp: 90 capsule, Rfl: 1 .  docusate sodium (COLACE) 100 MG capsule, Take 1 capsule (100 mg total) by mouth 2 (two) times daily. (Patient not taking:  Reported on 07/14/2018), Disp: 60 capsule, Rfl: 0 .  Mirabegron (MYRBETRIQ PO), Take by mouth., Disp: , Rfl:  .  zolpidem (AMBIEN) 5 MG tablet, TAKE 1 TABLET BY MOUTH AT BEDTIME AS NEEDED FOR SLEEP (Patient not taking: Reported on 07/14/2018), Disp: 30 tablet, Rfl: 4  Review of Systems  Constitutional: Negative.  Negative for fever and weight loss.  Respiratory: Negative.   Cardiovascular: Negative.   Gastrointestinal: Positive for abdominal pain and constipation (Chronic issue). Negative for abdominal distention, anal bleeding, anorexia, blood in stool, diarrhea, nausea, rectal pain and vomiting.  Genitourinary: Negative.  Negative for frequency and hematuria.  Musculoskeletal: Negative for myalgias.  Neurological: Negative for dizziness, light-headedness and headaches.    Social History   Tobacco Use  . Smoking status: Former Smoker    Packs/day: 1.50    Years: 12.00    Pack years: 18.00    Types: Cigarettes    Last attempt to quit: 08/24/1971    Years since quitting: 47.1  . Smokeless tobacco: Never Used  Substance Use Topics  . Alcohol use: No    Alcohol/week: 0.0 standard drinks      Objective:   BP 121/67 (BP Location: Left Arm, Patient Position: Sitting, Cuff Size: Normal)   Pulse 69   Temp 98 F (36.7 C) (Oral)   Wt 253 lb (114.8 kg)   BMI 33.38 kg/m  Vitals:   10/11/18 0942  BP: 121/67  Pulse: 69  Temp: 98 F (36.7 C)  TempSrc: Oral  Weight: 253 lb (114.8 kg)     Physical Exam  General Appearance:    Alert, cooperative, no distress  Abdomen:   bowel sounds present and normal in all 4 quadrants, soft. Vague grape sized mass deep left upper inguinal area. Patient states palpation of mass reproduces pain.    Results for orders placed or performed in visit on 10/11/18  POCT urinalysis dipstick  Result Value Ref Range   Color, UA     Clarity, UA     Glucose, UA Negative Negative   Bilirubin, UA Negative    Ketones, UA Negative    Spec Grav, UA 1.015  1.010 - 1.025   Blood, UA Moderate    pH, UA 6.0 5.0 - 8.0   Protein, UA Positive (A) Negative   Urobilinogen, UA 0.2 0.2 or 1.0 E.U./dL   Nitrite, UA Negative    Leukocytes, UA Small (1+) (A) Negative   Appearance     Odor         Assessment & Plan    1. Left inguinal pain  - POCT urinalysis dipstick - CULTURE, URINE COMPREHENSIVE - CT Abdomen Pelvis W Contrast; Future  2. Diffuse large B-cell lymphoma, unspecified body region [facility]) Has been nearly 2 years since last surveillance imaging - CT Abdomen Pelvis W Contrast; Future     Lelon Huh, MD  [facility]

## 2018-10-16 LAB — CULTURE, URINE COMPREHENSIVE

## 2018-10-16 LAB — SPECIMEN STATUS REPORT

## 2018-10-17 ENCOUNTER — Telehealth: Payer: Self-pay

## 2018-10-17 MED ORDER — NITROFURANTOIN MACROCRYSTAL 100 MG PO CAPS: 100.0000 mg | ORAL_CAPSULE | Freq: Two times a day (BID) | ORAL | 0 refills | Status: DC

## 2018-10-17 NOTE — Telephone Encounter (Signed)
Patient advised and verbally voiced understanding. Prescription sent into the pharmacy.

## 2018-10-17 NOTE — Telephone Encounter (Signed)
-----   Message from [name], MD sent at 10/17/2018  7:32 AM EST ----- Urine culture shows mild infections. Start macrodantin 100mg  twice a day for 7 days. Proceed with CT that is scheduled for tomorrow.

## 2018-10-18 ENCOUNTER — Ambulatory Visit
Admission: RE | Admit: 2018-10-18 | Discharge: 2018-10-18 | Disposition: A | Discharge status: Home / Self Care | Payer: PPO | Source: Ambulatory Visit | Attending: Family Medicine | Admitting: Family Medicine

## 2018-10-18 DIAGNOSIS — R1032 Left lower quadrant pain: Secondary | ICD-10-CM | POA: Insufficient documentation

## 2018-10-18 DIAGNOSIS — C8330 Diffuse large B-cell lymphoma, unspecified site: Secondary | ICD-10-CM

## 2018-10-18 HISTORY — DX: Diffuse large B-cell lymphoma, unspecified site: C83.30

## 2018-10-18 LAB — POCT I-STAT CREATININE: Creatinine, Ser: 1.2 mg/dL (ref 0.61–1.24)

## 2018-10-18 MED ORDER — IOHEXOL 300 MG/ML  SOLN
100.0000 mL | Freq: Once | INTRAMUSCULAR | Status: AC | PRN
  Administered 2018-10-18: 100 mL via INTRAVENOUS

## 2018-10-19 ENCOUNTER — Telehealth: Payer: Self-pay

## 2018-10-19 ENCOUNTER — Other Ambulatory Visit: Payer: Self-pay | Admitting: Family Medicine

## 2018-10-19 DIAGNOSIS — R0989 Other specified symptoms and signs involving the circulatory and respiratory systems: Secondary | ICD-10-CM

## 2018-10-19 DIAGNOSIS — R599 Enlarged lymph nodes, unspecified: Secondary | ICD-10-CM

## 2018-10-19 DIAGNOSIS — C8330 Diffuse large B-cell lymphoma, unspecified site: Secondary | ICD-10-CM

## 2018-10-19 DIAGNOSIS — R1032 Left lower quadrant pain: Secondary | ICD-10-CM

## 2018-10-19 MED ORDER — CIPROFLOXACIN HCL 500 MG PO TABS: 500.0000 mg | ORAL_TABLET | Freq: Two times a day (BID) | ORAL | 0 refills | Status: AC

## 2018-10-19 MED ORDER — METRONIDAZOLE 500 MG PO TABS: 500.0000 mg | ORAL_TABLET | Freq: Two times a day (BID) | ORAL | 0 refills | Status: AC

## 2018-10-19 NOTE — Progress Notes (Signed)
r 

## 2018-10-19 NOTE — Telephone Encounter (Signed)
Have sent prescription for ciprofloxacin and metronidazole to [facility]. Take instead of nitrofurantoin

## 2018-10-19 NOTE — Telephone Encounter (Signed)
-----   Message from [name], MD sent at 10/19/2018 12:29 PM EST ----- CT shows signs of recent diverticulitis. Please see if he is having any pain, if so then will need to get on another round of antibiotics.  Also has enlarged lymph node in left inguinal area. Can't tell if this is related to diverticulitis or his lymphoma. He is due for follow up with Dr. Grayland Ormond, so have sent order to [facility] to set up follow up referral.

## 2018-10-19 NOTE — Telephone Encounter (Signed)
Please verify if patient has been taking a full 50mg  tablet or 1/2 of a 50mg  tablet of metoprolol. If he is still takin 1/2, then we can change to a 25mg  tablet.

## 2018-10-19 NOTE — Telephone Encounter (Signed)
Patient advised as below. Patient states he is not having any pain, but it feels like something is in his left side. Patient agrees to referral. Dr. Caryn Section, will you be sending in the new antibiotic?

## 2018-10-19 NOTE — Telephone Encounter (Signed)
Patient advised and verbally voiced understanding.  

## 2018-10-19 NOTE — Telephone Encounter (Signed)
I called and spoke with patient. He states he is taking 1/2 tablet of the Metoprolol 50mg . He says it doesn't matter if you change the pill to 25mg , he is fine with either way.

## 2018-10-23 NOTE — Progress Notes (Signed)
[place]  [phone]) [phone]  Fax:(336) [phone]     DMARCUS DECICCO DOB: 06-Oct-1931  MR#: [id number]  DEY#:[id number]  Patient Care Team: [name], MD as PCP - General (Family Medicine) Dingeldein, Remo Lipps, MD as Consulting Physician (Ophthalmology) Hollice Espy, MD as Consulting Physician (Urology) Yolonda Kida, MD as Consulting Physician (Cardiology) Laneta Simmers as Physician Assistant (Urology)  CHIEF COMPLAINT: History of diffuse large B-cell lymphoma.  INTERVAL HISTORY: Patient was last evaluated in clinic on December 03, 2016.  He was noncompliant with scheduled appointments and subsequently lost to follow-up.  He is referred back after CT scan of the abdomen revealed mildly enlarged lymph nodes.  CT scan was done for increasing abdominal pain which is now resolved.  He currently feels well and is asymptomatic.  He has no neurologic complaints.  He denies any recent fevers or illnesses.  He has no night sweats or unintentional weight loss.  He denies any chest pain or shortness of breath. He has no nausea, vomiting, constipation, or diarrhea.  He denies any melena or hematochezia.  He has no urinary complaints today.  Patient offers no specific complaints today.  REVIEW OF SYSTEMS:   Review of Systems  Constitutional: Negative.  Negative for diaphoresis, fever, malaise/fatigue and weight loss.  Respiratory: Negative.  Negative for cough and shortness of breath.   Cardiovascular: Negative.  Negative for chest pain and leg swelling.  Gastrointestinal: Negative.  Negative for abdominal pain, blood in stool and melena.  Genitourinary: Negative.  Negative for dysuria, frequency, hematuria and urgency.  Musculoskeletal: Negative.  Negative for joint pain.  Skin: Negative.  Negative for rash.  Neurological: Negative.  Negative for dizziness, focal weakness, weakness and headaches.  Psychiatric/Behavioral: Negative.  The patient is not  nervous/anxious.     As per HPI. Otherwise, a complete review of systems is negative.  ONCOLOGY HISTORY:  No history exists.    PAST MEDICAL HISTORY: Past Medical History:  Diagnosis Date  . Arteriosclerosis of coronary artery 08/26/2011   Overview:      a.  1999 PCI of the mid LAD with stent.      b.  2002 Cath [facility]: EF 56%. RCA-distal 25/25%. Left main-50% ostial.  Left circumflex-25% OM2.  LAD-25% proximal.  75% mid.  25/25% distal. 75% D1.      c.  07/2011 PCI of RCA with DES.[place]   . Bleeding gastrointestinal 08/26/2011   Overview:      a.  Chronic abdominal pain, present improving.      b.  Duodenitis and gastritis by EGD in 2000.      c.  Pylori in 2000, treated.      d.  Gastroesophageal reflux disease.      e.  Diverticular disease.    . BP (high blood pressure) 08/26/2011  . Closed dislocation of right ankle 09/10/2016   Successfully reduced in ER 09/10/2016  . Diffuse large B-cell lymphoma ([place]) 2015   chemo tx's  . GERD (gastroesophageal reflux disease)   . Heart disease   . Helicobacter pylori infection 06/18/2015   by EGD RX 08/18/1999   . History of adenomatous polyp of colon 06/18/2015  . HOH (hard of hearing)    Bilateral Hearing  Aids  . Malleolar fracture (Right) 09/10/2016    PAST SURGICAL HISTORY: Past Surgical History:  Procedure Laterality Date  . ANGIOPLASTY    . CATARACT EXTRACTION    . COLONOSCOPY  2016  . CORONARY ANGIOPLASTY    .  EYE SURGERY Bilateral    Cataract Extraction with IOL  . GREEN LIGHT LASER TURP (TRANSURETHRAL RESECTION OF PROSTATE N/A 04/16/2015   Procedure: GREEN LIGHT LASER TURP (TRANSURETHRAL RESECTION OF PROSTATE WITH BLADDER BIOPSY;  Surgeon: Collier Flowers, MD;  Location: [facility] ORS;  Service: Urology;  Laterality: N/A;  . heart stent placement     1998, 2000, 2012  . TRANSURETHRAL RESECTION OF PROSTATE N/A 03/02/2017   Procedure: TRANSURETHRAL RESECTION OF THE PROSTATE (TURP);  Surgeon: Hollice Espy, MD;  Location: [facility] ORS;   Service: Urology;  Laterality: N/A;    FAMILY HISTORY Family History  Problem Relation Age of Onset  . Osteoporosis Mother   . Alzheimer's disease Father   . Kidney disease Neg Hx   . Prostate cancer Neg Hx   . Bladder Cancer Neg Hx   . Kidney cancer Neg Hx    AVANCED DIRECTIVES:    HEALTH MAINTENANCE: Social History   Tobacco Use  . Smoking status: Former Smoker    Packs/day: 1.50    Years: 12.00    Pack years: 18.00    Types: Cigarettes    Last attempt to quit: 08/24/1971    Years since quitting: 47.2  . Smokeless tobacco: Never Used  Substance Use Topics  . Alcohol use: No    Alcohol/week: 0.0 standard drinks  . Drug use: No    Allergies  Allergen Reactions  . Ciprofloxacin Diarrhea  . Lisinopril Other (See Comments)    Unknown   . Penicillins Swelling    unknwon reaction.  tolerates amoxicillin Has patient had a PCN reaction causing immediate rash, facial/tongue/throat swelling, SOB or lightheadedness with hypotension: No Has patient had a PCN reaction causing severe rash involving mucus membranes or skin necrosis: No Has patient had a PCN reaction that required hospitalization: No Has patient had a PCN reaction occurring within the last 10 years: Yes If all of the above answers are "NO", then may proceed with Cephalosporin use.   . Bactrim [Sulfamethoxazole-Trimethoprim] Rash  . Sulfa Antibiotics Itching and Rash    Current Outpatient Medications  Medication Sig Dispense Refill  . aspirin 81 MG tablet Take 81 mg by mouth daily.    Marland Kitchen atorvastatin (LIPITOR) 10 MG tablet TAKE ONE TABLET BY MOUTH ONCE DAILY 90 tablet 3  . benazepril-hydrochlorthiazide (LOTENSIN HCT) 5-6.25 MG tablet Take 1 tablet by mouth once daily 90 tablet 4  . Cholecalciferol (VITAMIN D3) 1000 units CAPS Take 1,000 Units by mouth daily.    . ciprofloxacin (CIPRO) 500 MG tablet Take 1 tablet (500 mg total) by mouth 2 (two) times daily for 10 days. 20 tablet 0  . clopidogrel (PLAVIX) 75 MG  tablet TAKE ONE TABLET BY MOUTH ONCE DAILY 90 tablet 4  . docusate sodium (COLACE) 100 MG capsule Take 1 capsule (100 mg total) by mouth 2 (two) times daily. 60 capsule 0  . famotidine (PEPCID) 20 MG tablet Take 1 tablet (20 mg total) by mouth 2 (two) times daily. 180 tablet 3  . finasteride (PROSCAR) 5 MG tablet Take 1 tablet (5 mg total) by mouth daily. 90 tablet 3  . isosorbide dinitrate (ISORDIL) 30 MG tablet Take 1 tablet (30 mg total) by mouth every morning. 90 tablet 4  . Magnesium 250 MG TABS Take 250 mg by mouth daily.    . metoprolol succinate (TOPROL-XL) 25 MG 24 hr tablet Take 1 tablet (25 mg total) by mouth daily. 90 tablet 4  . metroNIDAZOLE (FLAGYL) 500 MG tablet Take 1 tablet (  500 mg total) by mouth 2 (two) times daily for 10 days. 20 tablet 0  . Mirabegron (MYRBETRIQ PO) Take by mouth.    . Multiple Vitamins-Minerals (MULTIVITAMIN ADULT PO) Take by mouth daily.    . Multiple Vitamins-Minerals (VISION FORMULA 2 PO) Take by mouth daily.    . nitroGLYCERIN (NITROSTAT) 0.4 MG SL tablet Place 0.4 mg under the tongue every 5 (five) minutes as needed for chest pain. Reported on 03/10/2016    . omeprazole (PRILOSEC) 40 MG capsule TAKE ONE CAPSULE BY MOUTH TWICE DAILY 180 capsule 4  . psyllium (METAMUCIL) 58.6 % packet Take 1 packet by mouth daily.    . sucralfate (CARAFATE) 1 g tablet Take 1 tablet (1 g total) by mouth 2 (two) times daily as needed (heartburn or refllux). 60 tablet 12  . tamsulosin (FLOMAX) 0.4 MG CAPS capsule TAKE 1 CAPSULE BY MOUTH ONCE DAILY 90 capsule 3  . temazepam (RESTORIL) 30 MG capsule TAKE 1 CAPSULE BY MOUTH AT BEDTIME AS NEEDED 90 capsule 1  . zolpidem (AMBIEN) 5 MG tablet TAKE 1 TABLET BY MOUTH AT BEDTIME AS NEEDED FOR SLEEP 30 tablet 4   No current facility-administered medications for this visit.     OBJECTIVE: BP 131/74 (BP Location: Left Arm, Patient Position: Sitting)   Pulse 81   Temp 97.6 F (36.4 C) (Tympanic)   Ht 6\' 1"  (1.854 m)   Wt 250 lb  1.6 oz (113.4 kg)   BMI 33.00 kg/m    Body mass index is 33 kg/m.    ECOG FS:0 - Asymptomatic  General: Well-developed, well-nourished, no acute distress. Eyes: Pink conjunctiva, anicteric sclera. HEENT: Normocephalic, moist mucous membranes, clear oropharnyx. Lungs: Clear to auscultation bilaterally. Heart: Regular rate and rhythm. No rubs, murmurs, or gallops. Abdomen: Soft, nontender, nondistended. No organomegaly noted, normoactive bowel sounds. Musculoskeletal: No edema, cyanosis, or clubbing. Neuro: Alert, answering all questions appropriately. Cranial nerves grossly intact. Skin: No rashes or petechiae noted. Psych: Normal affect. Lymphatics: No cervical, calvicular, axillary or inguinal LAD.   LAB RESULTS:  No visits with results within 3 Day(s) from this visit.  Latest known visit with results is:  Hospital Outpatient Visit on 10/18/2018  Component Date Value Ref Range Status  . Creatinine, Ser 10/18/2018 1.20  0.61 - 1.24 mg/dL Final    STUDIES: No results found.   ASSESSMENT: History of diffuse large B-cell lymphoma.  PLAN:    1. Diffuse Large B Cell Lymphoma: Patient was initially diagnosed in August 2015, pathology was noted to be anaplastic subtype.  He completed 6 cycles of R-CHOP chemotherapy in January 2016.  PET scan January 2017 revealed no evidence of disease.  CT scan of abdomen and pelvis on October 18, 2018 reviewed independently revealing inflammatory changes in the sigmoid colon as well as suspected secondary cystitis with inflammatory changes involving the bladder diverticulum.  Patient also noted to have mildly progressive left external iliac nodes measuring approximately 16 mm.  Suspect these are inflammatory.  Given his history, will get a CT of the chest to assess for additional lymphadenopathy.  Plan to repeat CT scans in 3 months to assess for any interval change.  Will also discuss patient at case conference later this week to get urology and  radiology input.  Return to clinic in 3 months after his CT scans for further evaluation.  Will call patient if consensus at case conference recommends differently. 2.  Secondary cystitis: Consider possible referral back to urology. 3.  Possible sigmoid diverticulitis: Consider  GI referral for colonoscopy.  Patient expressed understanding and was in agreement with this plan. He also understands that He can call clinic at any time with any questions, concerns, or complaints.    Lloyd Huger, MD   10/25/2018 9:16 AM

## 2018-10-24 ENCOUNTER — Other Ambulatory Visit: Payer: Self-pay

## 2018-10-24 ENCOUNTER — Inpatient Hospital Stay: Payer: PPO | Attending: Oncology | Admitting: Oncology

## 2018-10-24 ENCOUNTER — Encounter: Payer: Self-pay | Admitting: Oncology

## 2018-10-24 VITALS — BP 131/74 | HR 81 | Temp 97.6°F | Ht 73.0 in | Wt 250.1 lb

## 2018-10-24 DIAGNOSIS — Z8572 Personal history of non-Hodgkin lymphomas: Secondary | ICD-10-CM | POA: Diagnosis not present

## 2018-10-24 DIAGNOSIS — R599 Enlarged lymph nodes, unspecified: Secondary | ICD-10-CM | POA: Insufficient documentation

## 2018-10-24 DIAGNOSIS — C8330 Diffuse large B-cell lymphoma, unspecified site: Secondary | ICD-10-CM

## 2018-10-24 NOTE — Progress Notes (Signed)
Patient is here today to establish care on his inguinal lymph node on his pelvic. Patient stated that for several months, he has had pain on his left upper quadrant. Therefore, his PCP ordered a CT abdomen and pelvis and they found that he had diverticulitis and an inguinal lymph node.

## 2018-10-26 ENCOUNTER — Other Ambulatory Visit: Payer: PPO

## 2018-10-26 NOTE — Progress Notes (Signed)
Tumor Board Documentation  RUDOLPH DAOUST was presented by Dr Grayland Ormond at our Tumor Board on 10/26/2018, which included representatives from medical oncology, radiation oncology, surgical, radiology, pathology, navigation, internal medicine, research, pulmonology.  Jamarrius currently presents as a current patient, for discussion with history of the following treatments: adjuvant chemotherapy, active survellience.  Additionally, we reviewed previous medical and familial history, history of present illness, and recent lab results along with all available histopathologic and imaging studies. The tumor board considered available treatment options and made the following recommendations: Active surveillance Refer to Urology, Repeat CT in 3 months  The following procedures/referrals were also placed: No orders of the defined types were placed in this encounter.   Clinical Trial Status: not discussed   Staging used: AJCC Stage Group  AJCC Staging:       Group: B Cell Lymphoma   National site-specific guidelines NCCN were discussed with respect to the case.  Tumor board is a meeting of clinicians from various specialty areas who evaluate and discuss patients for whom a multidisciplinary approach is being considered. Final determinations in the plan of care are those of the provider(s). The responsibility for follow up of recommendations given during tumor board is that of the provider.   Today's extended care, comprehensive team conference, Jaicion was not present for the discussion and was not examined.   Multidisciplinary Tumor Board is a multidisciplinary case peer review process.  Decisions discussed in the Multidisciplinary Tumor Board reflect the opinions of the specialists present at the conference without having examined the patient.  Ultimately, treatment and diagnostic decisions rest with the primary provider(s) and the patient.

## 2018-10-30 ENCOUNTER — Telehealth: Payer: Self-pay

## 2018-10-30 MED ORDER — AMOXICILLIN-POT CLAVULANATE 875-125 MG PO TABS: 1.0000 | ORAL_TABLET | Freq: Two times a day (BID) | ORAL | 0 refills | Status: DC

## 2018-10-30 NOTE — Telephone Encounter (Signed)
Pt stopped into clinic and was advising that he is still having pain in his left lower abdomen.  He advised that you have seen him for this pain and he was given two prescriptions for it and was thought to be diverticulitis.  Pt is asking if he needs to take another round of the medication or does he need to be seen again for this.  His pain level is a 4 out of 10.  The pain is a nagging pain and it gets worse thru put the day.   [facility] [address].  Call back # [phone]

## 2018-10-30 NOTE — Telephone Encounter (Signed)
Have sent prescription for new antibiotic for diverticulitis. Call if not improving by end of week.

## 2018-10-30 NOTE — Telephone Encounter (Signed)
Patient advised and verbally voiced understanding.  

## 2018-10-31 ENCOUNTER — Other Ambulatory Visit: Payer: Self-pay

## 2018-10-31 ENCOUNTER — Ambulatory Visit
Admission: RE | Admit: 2018-10-31 | Discharge: 2018-10-31 | Disposition: A | Discharge status: Home / Self Care | Payer: PPO | Source: Ambulatory Visit | Attending: Oncology | Admitting: Oncology

## 2018-10-31 DIAGNOSIS — C8330 Diffuse large B-cell lymphoma, unspecified site: Secondary | ICD-10-CM | POA: Diagnosis not present

## 2018-10-31 DIAGNOSIS — J8410 Pulmonary fibrosis, unspecified: Secondary | ICD-10-CM | POA: Diagnosis not present

## 2018-10-31 MED ORDER — IOHEXOL 300 MG/ML  SOLN
75.0000 mL | Freq: Once | INTRAMUSCULAR | Status: AC | PRN
  Administered 2018-10-31: 75 mL via INTRAVENOUS

## 2018-10-31 NOTE — Progress Notes (Signed)
11/01/2018 10:16 AM   Bryan Nelson 04-Apr-1932 [id number]  Referring provider: [name], MD [address] [place], [place] [address]   HPI: Bryan Nelson is a 85 y.o. male Caucasian who is very hard of hearing with a history of rUTI's, BPH with LU TS and nocturia who presents today for a yearly follow up.    History of rUTI's Risk factors: age, bladder diverticulum and CIC  + Enterococcus faecalis pan sensitive on 10/11/2018 + E. Coli resistant to Augmentin, ampicillin, cefazolin and ceftriaxone on 09/13/2018 + Citrobacter freundii resistant to Augmentin, cefazolin, ceftriaxone and cefuroxime on 08/28/2018 + Citrobacter freundii resistant to Augmentin, cefazolin, ceftriaxone and cefuroxime on 07/14/2018 + Citrobacter freudii resistant to Augmentin, cefazolin and cefuroxime on 03/31/2018 + Enterococcus faecalis resistant to tetracycline on 03/16/2018 + Enterococcus faecalis resistant to tetracycline 02/27/2018  Contrast CT on 10/18/2018 noted adrenal glands are within normal limits.  Bilateral renal cysts, measuring up to 5.3 cm in the left upper pole. No hydronephrosis.  Thick-walled bladder with a moderate left posterolateral bladder diverticulum (series 2/image 35). Mild perivesical stranding, including wall thickening/inflammatory changes along the superior aspect of the bladder diverticulum (coronal image 92).   Patient is currently taking Augmentin for diverticulitis. He has no urinary symptoms of gross hematuria, dysuria or suprapubic/flank pain.  Patient denies any fevers, chills, nausea or vomiting.   CATH UA today was positive for 11-30 RBC's.    BPH WITH LUTS  (prostate and/or bladder) IPSS score: 2/3  PVR: 97 mL  Major complaint(s): Nocturia x 2.    Currently taking: Tamsulosin 0.4 mg daily.  Had not taken finasteride since 06/2017 -confirmed with pharmacy   Underwent TURP in 02/2017.  Prostate chips negative for malignancy.    Patient says today  (11/01/2018) that he usually goes to sleep around 10, 10:30 and wakes up at midnight, which is when he usually catheterizes, and then 4AM when he wakes up with bladder pressure but cannot void; that has happened for a month or so.  Around 5, he wants up and he can urinate.  He also catheterizes when he goes on a walk.  He gets very little.  Patient's cath UA had microhematuria but he saw no blood on the catheter.  Patient denies bladder spasms.  Denies any recent fevers, chills, nausea or vomiting. IPSS    Row Name 11/01/18 0900         International Prostate Symptom Score   How often have you had the sensation of not emptying your bladder?  Not at All     How often have you had to urinate less than every two hours?  Not at All     How often have you found you stopped and started again several times when you urinated?  Not at All     How often have you found it difficult to postpone urination?  Not at All     How often have you had a weak urinary stream?  Not at All     How often have you had to strain to start urination?  Not at All     How many times did you typically get up at night to urinate?  2 Times     Total IPSS Score  2       Quality of Life due to urinary symptoms   If you were to spend the rest of your life with your urinary condition just the way it is now how would  you feel about that?  Mixed       Score:  1-7 Mild 8-19 Moderate 20-35 Severe   Nocturia See above   PMH: Past Medical History:  Diagnosis Date  . Arteriosclerosis of coronary artery 08/26/2011   Overview:      a.  1999 PCI of the mid LAD with stent.      b.  2002 Cath [place]: EF 56%. RCA-distal 25/25%. Left main-50% ostial.  Left circumflex-25% OM2.  LAD-25% proximal.  75% mid.  25/25% distal. 75% D1.      c.  07/2011 PCI of RCA with DES.[place]   . Bleeding gastrointestinal 08/26/2011   Overview:      a.  Chronic abdominal pain, present improving.      b.  Duodenitis and gastritis by EGD in 2000.      c.   Pylori in 2000, treated.      d.  Gastroesophageal reflux disease.      e.  Diverticular disease.    . BP (high blood pressure) 08/26/2011  . Closed dislocation of right ankle 09/10/2016   Successfully reduced in ER 09/10/2016  . Diffuse large B-cell lymphoma ([place]) 2015   chemo tx's  . GERD (gastroesophageal reflux disease)   . Heart disease   . Helicobacter pylori infection 06/18/2015   by EGD RX 08/18/1999   . History of adenomatous polyp of colon 06/18/2015  . HOH (hard of hearing)    Bilateral Hearing  Aids  . Malleolar fracture (Right) 09/10/2016    Surgical History: Past Surgical History:  Procedure Laterality Date  . ANGIOPLASTY    . CATARACT EXTRACTION    . COLONOSCOPY  2016  . CORONARY ANGIOPLASTY    . EYE SURGERY Bilateral    Cataract Extraction with IOL  . GREEN LIGHT LASER TURP (TRANSURETHRAL RESECTION OF PROSTATE N/A 04/16/2015   Procedure: GREEN LIGHT LASER TURP (TRANSURETHRAL RESECTION OF PROSTATE WITH BLADDER BIOPSY;  Surgeon: Collier Flowers, MD;  Location: [facility] ORS;  Service: Urology;  Laterality: N/A;  . heart stent placement     1998, 2000, 2012  . TRANSURETHRAL RESECTION OF PROSTATE N/A 03/02/2017   Procedure: TRANSURETHRAL RESECTION OF THE PROSTATE (TURP);  Surgeon: Hollice Espy, MD;  Location: [facility] ORS;  Service: Urology;  Laterality: N/A;    Home Medications:  Allergies as of 11/01/2018      Reactions   Ciprofloxacin Diarrhea   Lisinopril Other (See Comments)   Unknown    Penicillins Swelling   unknwon reaction.  tolerates amoxicillin Has patient had a PCN reaction causing immediate rash, facial/tongue/throat swelling, SOB or lightheadedness with hypotension: No Has patient had a PCN reaction causing severe rash involving mucus membranes or skin necrosis: No Has patient had a PCN reaction that required hospitalization: No Has patient had a PCN reaction occurring within the last 10 years: Yes If all of the above answers are "NO", then may proceed with  Cephalosporin use.   Bactrim [sulfamethoxazole-trimethoprim] Rash   Sulfa Antibiotics Itching, Rash      Medication List       Accurate as of November 01, 2018 10:16 AM. Always use your most recent med list.        amoxicillin-clavulanate 875-125 MG tablet Commonly known as:  AUGMENTIN Take 1 tablet by mouth 2 (two) times daily for 10 days.   aspirin 81 MG tablet Take 81 mg by mouth daily.   atorvastatin 10 MG tablet Commonly known as:  LIPITOR TAKE ONE TABLET BY MOUTH ONCE DAILY  benazepril-hydrochlorthiazide 5-6.25 MG tablet Commonly known as:  LOTENSIN HCT Take 1 tablet by mouth once daily   clopidogrel 75 MG tablet Commonly known as:  PLAVIX TAKE ONE TABLET BY MOUTH ONCE DAILY   docusate sodium 100 MG capsule Commonly known as:  COLACE Take 1 capsule (100 mg total) by mouth 2 (two) times daily.   famotidine 20 MG tablet Commonly known as:  Pepcid Take 1 tablet (20 mg total) by mouth 2 (two) times daily.   finasteride 5 MG tablet Commonly known as:  Proscar Take 1 tablet (5 mg total) by mouth daily.   isosorbide dinitrate 30 MG tablet Commonly known as:  ISORDIL Take 1 tablet (30 mg total) by mouth every morning.   Magnesium 250 MG Tabs Take 250 mg by mouth daily.   metoprolol succinate 25 MG 24 hr tablet Commonly known as:  TOPROL-XL Take 1 tablet (25 mg total) by mouth daily.   MYRBETRIQ PO Take by mouth.   nitroGLYCERIN 0.4 MG SL tablet Commonly known as:  NITROSTAT Place 0.4 mg under the tongue every 5 (five) minutes as needed for chest pain. Reported on 03/10/2016   omeprazole 40 MG capsule Commonly known as:  PRILOSEC TAKE ONE CAPSULE BY MOUTH TWICE DAILY   psyllium 58.6 % packet Commonly known as:  METAMUCIL Take 1 packet by mouth daily.   sucralfate 1 g tablet Commonly known as:  CARAFATE Take 1 tablet (1 g total) by mouth 2 (two) times daily as needed (heartburn or refllux).   tamsulosin 0.4 MG Caps capsule Commonly known as:   FLOMAX TAKE 1 CAPSULE BY MOUTH ONCE DAILY   temazepam 30 MG capsule Commonly known as:  RESTORIL TAKE 1 CAPSULE BY MOUTH AT BEDTIME AS NEEDED   VISION FORMULA 2 PO Take by mouth daily.   MULTIVITAMIN ADULT PO Take by mouth daily.   Vitamin D3 25 MCG (1000 UT) Caps Take 1,000 Units by mouth daily.   zolpidem 5 MG tablet Commonly known as:  AMBIEN TAKE 1 TABLET BY MOUTH AT BEDTIME AS NEEDED FOR SLEEP       Allergies:  Allergies  Allergen Reactions  . Ciprofloxacin Diarrhea  . Lisinopril Other (See Comments)    Unknown   . Penicillins Swelling    unknwon reaction.  tolerates amoxicillin Has patient had a PCN reaction causing immediate rash, facial/tongue/throat swelling, SOB or lightheadedness with hypotension: No Has patient had a PCN reaction causing severe rash involving mucus membranes or skin necrosis: No Has patient had a PCN reaction that required hospitalization: No Has patient had a PCN reaction occurring within the last 10 years: Yes If all of the above answers are "NO", then may proceed with Cephalosporin use.   . Bactrim [Sulfamethoxazole-Trimethoprim] Rash  . Sulfa Antibiotics Itching and Rash    Family History: Family History  Problem Relation Age of Onset  . Osteoporosis Mother   . Alzheimer's disease Father   . Kidney disease Neg Hx   . Prostate cancer Neg Hx   . Bladder Cancer Neg Hx   . Kidney cancer Neg Hx     Social History:  reports that he quit smoking about 47 years ago. His smoking use included cigarettes. He has a 18.00 pack-year smoking history. He has never used smokeless tobacco. He reports that he does not drink alcohol or use drugs.  ROS: UROLOGY Frequent Urination?: No Hard to postpone urination?: No Burning/pain with urination?: No Get up at night to urinate?: No Leakage of urine?: No Urine  stream starts and stops?: No Trouble starting stream?: No Do you have to strain to urinate?: No Blood in urine?: No Urinary tract  infection?: No Sexually transmitted disease?: No Injury to kidneys or bladder?: No Painful intercourse?: No Weak stream?: No Erection problems?: No Penile pain?: No  Gastrointestinal Nausea?: No Vomiting?: No Indigestion/heartburn?: No Diarrhea?: No Constipation?: No  Constitutional Fever: No Night sweats?: No Weight loss?: No Fatigue?: No  Skin Skin rash/lesions?: No Itching?: No  Eyes Blurred vision?: No Double vision?: No  Ears/Nose/Throat Sore throat?: No Sinus problems?: No  Hematologic/Lymphatic Swollen glands?: No Easy bruising?: No  Cardiovascular Leg swelling?: No Chest pain?: No  Respiratory Cough?: No Shortness of breath?: No  Endocrine Excessive thirst?: No  Musculoskeletal Back pain?: No Joint pain?: No  Neurological Headaches?: No Dizziness?: No  Psychologic Depression?: No Anxiety?: No  Physical Exam: BP (!) 145/80   Pulse 94   Ht 6\' 1"  (1.854 m)   Wt 250 lb (113.4 kg)   BMI 32.98 kg/m   Constitutional:  Well nourished. Alert and oriented, No acute distress. HEENT: [place] AT, moist mucus membranes.  Trachea midline. Cardiovascular: No clubbing, cyanosis, or edema. Respiratory: Normal respiratory effort, no increased work of breathing. GU: No CVA tenderness.  No bladder fullness or masses.  Patient with circumcised phallus.  Urethral meatus is patent.  No penile discharge. No penile lesions or rashes. Scrotum without lesions, cysts, rashes and/or edema.  Testicles are located scrotally bilaterally. No masses are appreciated in the testicles. Left and right epididymis are normal. Rectal: Patient with normal sphincter tone.  Anus and perineum without scarring or rashes.  No rectal masses are appreciated. Prostate is approximately 60 grams, only the apex and midportion could be palpated, no nodules are appreciated.  Seminal vesicles could not be palpated. Skin: No rashes, bruises or suspicious lesions. Lymph: No inguinal  adenopathy. Neurologic: Grossly intact, no focal deficits, moving all 4 extremities. Psychiatric: Normal mood and affect.  Laboratory Data: Lab Results  Component Value Date   WBC 4.8 08/10/2018   HGB 13.1 08/10/2018   HCT 38.9 08/10/2018   MCV 88 08/10/2018   PLT 240 08/10/2018    Lab Results  Component Value Date   CREATININE 1.20 10/18/2018    Lab Results  Component Value Date   PSA 0.81 11/22/2016   PSA 0.9 02/27/2014    Lab Results  Component Value Date   HGBA1C 5.9 11/22/2016   Urinalaysis 11-30 RBC, 0-10 renal epithelial cells, BLO 2+, PRO 1+, LEU trace.  See Epic.  I have reviewed the labs.  Pertinent Imaging: CLINICAL DATA:  Left lower quadrant pain, abdominal distension. History of BPH status post TURP. History of diffuse large B-cell lymphoma status post chemotherapy.  EXAM: CT ABDOMEN AND PELVIS WITH CONTRAST  TECHNIQUE: Multidetector CT imaging of the abdomen and pelvis was performed using the standard protocol following bolus administration of intravenous contrast.  CONTRAST:  149mL OMNIPAQUE IOHEXOL 300 MG/ML  SOLN  COMPARISON:  11/03/2016  FINDINGS: Lower chest: Lung bases are clear.  Hepatobiliary: Liver is within normal limits.  Gallbladder is unremarkable. No intrahepatic or extrahepatic ductal dilatation.  Pancreas: Within normal limits.  Spleen: Within normal limits.  Adrenals/Urinary Tract: Adrenal glands are within normal limits.  Bilateral renal cysts, measuring up to 5.3 cm in the left upper pole. No hydronephrosis.  Thick-walled bladder with a moderate left posterolateral bladder diverticulum (series 2/image 35). Mild perivesical stranding, including wall thickening/inflammatory changes along the superior aspect of the bladder diverticulum (coronal  image 92).  Stomach/Bowel: Stomach is within normal limits.  No evidence of bowel obstruction.  Normal appendix (series 2/image 45).  Extensive  proximal sigmoid diverticulosis. Localized soft tissue/inflammatory change along the posterior aspect of the sigmoid colon (series 2/image 69) and superior aspect of the bladder diverticulum.  Statistically, this appearance is favored to be related to localized perforation of recent/prior sigmoid diverticulitis, with reactive inflammatory changes involving the bladder diverticulum. Technically speaking, primary inflammatory process involving the bladder diverticulum is also possible, although less likely. Urothelial carcinomas can also arise in a bladder diverticulum, but this is not favored.  Vascular/Lymphatic: No evidence of abdominal aortic aneurysm.  Atherosclerotic calcifications of the abdominal aorta and branch vessels.  Mildly progressive left external iliac nodes, measuring up to 16 mm short axis (series 2/image 35), previously 11 mm.  Reproductive: Postsurgical changes related to prior TURP.  Other: No abdominopelvic ascites.  No free air.  Musculoskeletal: Degenerative changes of the visualized thoracolumbar spine.  IMPRESSION: Localized soft tissue/inflammatory changes along the posterior aspect of the sigmoid colon and superior aspect of a left posterolateral bladder diverticulum, favoring localized perforation related to recent/prior sigmoid diverticulitis. No free air.  Suspected secondary cystitis with inflammatory changes involving bladder diverticulum. Technically speaking, urothelial carcinoma can also arise in a bladder diverticulum, although this is not favored.  Mildly progressive left external iliac nodes, measuring up to 16 mm short axis, possibly related to the patient's known lymphoma. Attention on follow-up is suggested.   Electronically Signed   By: Julian Hy M.D.   On: 10/18/2018 17:10  I have independently reviewed the films with Dr. Erlene Quan and changes in bladder favor inflammation.    Assessment & Plan:    1.  Bladder diverticulum Recent CT from 10/18/2018 performed while patient was being actively treated for an UTI, therefore changes in bladder diverticulum are likely due to inflammatory nature versus urothelial carcinoma Currently being treated for diverticulitis  Will repeat culture  Will repeat CT scan on 02/01/2019   2. History of recurrent UTI  - Bacterial colonization related to clean intermittent catheterization  - Contact our office for symptoms of UTI  3. Benign prostatic hyperplasia with urinary obstruction and frequency  - Continue Flomax and finasteride; refills given - Return to clinic in 1 year for I PSS, PVR and exam  4. Nocturia - suggested to patient to cath himself prior to bedtime to see if this reduces his nocturia   Zara Council, PA-C  [address] [place], [place] [address] [phone]  I, Adele Schilder, am acting as a Education administrator for [employer], PA-C.   I have reviewed the above documentation for accuracy and completeness, and I agree with the above.    Zara Council, PA-C

## 2018-11-01 ENCOUNTER — Telehealth: Payer: Self-pay | Admitting: *Deleted

## 2018-11-01 ENCOUNTER — Ambulatory Visit: Payer: PPO | Admitting: Urology

## 2018-11-01 ENCOUNTER — Other Ambulatory Visit: Payer: Self-pay | Admitting: *Deleted

## 2018-11-01 ENCOUNTER — Other Ambulatory Visit: Payer: Self-pay

## 2018-11-01 ENCOUNTER — Encounter: Payer: Self-pay | Admitting: Urology

## 2018-11-01 VITALS — BP 145/80 | HR 94 | Ht 73.0 in | Wt 250.0 lb

## 2018-11-01 DIAGNOSIS — N138 Other obstructive and reflux uropathy: Secondary | ICD-10-CM

## 2018-11-01 DIAGNOSIS — R350 Frequency of micturition: Secondary | ICD-10-CM | POA: Diagnosis not present

## 2018-11-01 DIAGNOSIS — N401 Enlarged prostate with lower urinary tract symptoms: Secondary | ICD-10-CM | POA: Diagnosis not present

## 2018-11-01 DIAGNOSIS — N323 Diverticulum of bladder: Secondary | ICD-10-CM

## 2018-11-01 DIAGNOSIS — Z87440 Personal history of urinary (tract) infections: Secondary | ICD-10-CM

## 2018-11-01 DIAGNOSIS — R351 Nocturia: Secondary | ICD-10-CM | POA: Diagnosis not present

## 2018-11-01 DIAGNOSIS — C8330 Diffuse large B-cell lymphoma, unspecified site: Secondary | ICD-10-CM

## 2018-11-01 LAB — MICROSCOPIC EXAMINATION
Bacteria, UA: NONE SEEN
Epithelial Cells (non renal): NONE SEEN /hpf (ref 0–10)

## 2018-11-01 LAB — URINALYSIS, COMPLETE
BILIRUBIN UA: NEGATIVE
Glucose, UA: NEGATIVE
Ketones, UA: NEGATIVE
NITRITE UA: NEGATIVE
Specific Gravity, UA: 1.025 (ref 1.005–1.030)
Urobilinogen, Ur: 0.2 mg/dL (ref 0.2–1.0)
pH, UA: 6 (ref 5.0–7.5)

## 2018-11-01 LAB — BLADDER SCAN AMB NON-IMAGING: Scan Result: 97

## 2018-11-01 MED ORDER — FINASTERIDE 5 MG PO TABS: 5.0000 mg | ORAL_TABLET | Freq: Every day | ORAL | 3 refills | Status: AC

## 2018-11-01 NOTE — Telephone Encounter (Signed)
Left vm for patient regarding CT scan results and follow up. Patient needs CT Abd/Pelvis in 3 months, CT chest was normal. Orders placed and scheduling message sent.

## 2018-11-03 DIAGNOSIS — R202 Paresthesia of skin: Secondary | ICD-10-CM | POA: Diagnosis not present

## 2018-11-03 DIAGNOSIS — L853 Xerosis cutis: Secondary | ICD-10-CM | POA: Diagnosis not present

## 2018-11-03 DIAGNOSIS — L821 Other seborrheic keratosis: Secondary | ICD-10-CM | POA: Diagnosis not present

## 2018-11-03 DIAGNOSIS — L570 Actinic keratosis: Secondary | ICD-10-CM | POA: Diagnosis not present

## 2018-11-03 DIAGNOSIS — X32XXXA Exposure to sunlight, initial encounter: Secondary | ICD-10-CM | POA: Diagnosis not present

## 2018-11-03 LAB — CULTURE, URINE COMPREHENSIVE

## 2018-11-06 ENCOUNTER — Encounter: Payer: Self-pay | Admitting: Family Medicine

## 2018-11-06 ENCOUNTER — Telehealth: Payer: Self-pay

## 2018-11-06 DIAGNOSIS — K5792 Diverticulitis of intestine, part unspecified, without perforation or abscess without bleeding: Secondary | ICD-10-CM

## 2018-11-06 MED ORDER — METRONIDAZOLE 500 MG PO TABS: 500.0000 mg | ORAL_TABLET | Freq: Two times a day (BID) | ORAL | 0 refills | Status: AC

## 2018-11-06 MED ORDER — CIPROFLOXACIN HCL 500 MG PO TABS: 500.0000 mg | ORAL_TABLET | Freq: Two times a day (BID) | ORAL | 0 refills | Status: DC

## 2018-11-06 NOTE — Telephone Encounter (Signed)
Can change to combination of ciprofloxacin 500mg  twice a day for 10 days, and metronidazole 500mg  twice a day for 10 days. His allergy list states he has diarrhea with ciprofloxacin, but this is probably the only thing that will get the diverticulitis to resolve.

## 2018-11-06 NOTE — Telephone Encounter (Signed)
Pt finished Augmentin for diverticulitis.  He states he is still having lower left abdominal pain.  Pt says the pain is not worse but has not improved any.  He is wanting to know what the next step is.  Pt uses [facility]  Please advise.   Thanks,   -Mickel Baas

## 2018-11-06 NOTE — Telephone Encounter (Signed)
Patient advised and agrees with treatment plan. Prescriptions sent into pharmacy.

## 2018-11-08 ENCOUNTER — Telehealth: Payer: Self-pay | Admitting: Family Medicine

## 2018-11-08 NOTE — Telephone Encounter (Signed)
Pt called saying he has bad pain in both legs below his knees.  No redness or swelling.  CB#  [phone]  Thanks Con Memos

## 2018-11-08 NOTE — Telephone Encounter (Signed)
Spoke with pt.  Made OV for 11/09/2018 at 8:20  Thanks,   -Mickel Baas

## 2018-11-09 ENCOUNTER — Other Ambulatory Visit: Payer: Self-pay

## 2018-11-09 ENCOUNTER — Ambulatory Visit (INDEPENDENT_AMBULATORY_CARE_PROVIDER_SITE_OTHER): Payer: PPO | Admitting: Family Medicine

## 2018-11-09 ENCOUNTER — Ambulatory Visit
Admission: RE | Admit: 2018-11-09 | Discharge: 2018-11-09 | Disposition: A | Discharge status: Home / Self Care | Payer: PPO | Attending: Family Medicine | Admitting: Family Medicine

## 2018-11-09 ENCOUNTER — Ambulatory Visit
Admission: RE | Admit: 2018-11-09 | Discharge: 2018-11-09 | Disposition: A | Discharge status: Home / Self Care | Payer: PPO | Source: Ambulatory Visit | Attending: Family Medicine | Admitting: Family Medicine

## 2018-11-09 ENCOUNTER — Encounter: Payer: Self-pay | Admitting: Family Medicine

## 2018-11-09 VITALS — BP 120/60 | HR 84 | Temp 97.9°F | Resp 16 | Wt 252.0 lb

## 2018-11-09 DIAGNOSIS — R202 Paresthesia of skin: Secondary | ICD-10-CM

## 2018-11-09 DIAGNOSIS — G8929 Other chronic pain: Secondary | ICD-10-CM | POA: Insufficient documentation

## 2018-11-09 DIAGNOSIS — M79605 Pain in left leg: Principal | ICD-10-CM

## 2018-11-09 DIAGNOSIS — M79604 Pain in right leg: Secondary | ICD-10-CM

## 2018-11-09 DIAGNOSIS — M549 Dorsalgia, unspecified: Secondary | ICD-10-CM | POA: Insufficient documentation

## 2018-11-09 DIAGNOSIS — N183 Chronic kidney disease, stage 3 unspecified: Secondary | ICD-10-CM

## 2018-11-09 DIAGNOSIS — R200 Anesthesia of skin: Secondary | ICD-10-CM

## 2018-11-09 DIAGNOSIS — M545 Low back pain: Secondary | ICD-10-CM | POA: Diagnosis not present

## 2018-11-09 MED ORDER — NITROGLYCERIN 0.4 MG SL SUBL: 0.4000 mg | SUBLINGUAL_TABLET | SUBLINGUAL | 1 refills | Status: DC | PRN

## 2018-11-09 MED ORDER — GABAPENTIN 300 MG PO CAPS: 300.0000 mg | ORAL_CAPSULE | Freq: Every day | ORAL | 3 refills | Status: DC

## 2018-11-09 NOTE — Patient Instructions (Signed)
.   Please review the attached list of medications and notify my office if there are any errors.   . Please bring all of your medications to every appointment so we can make sure that our medication list is the same as yours.   . Please go to the lab draw station in [address] on the second floor of [facility]. Normal hours are 8:00am to 12:30pm and 1:30pm to 4:00pm Monday through Friday  . Go to the [facility] on [facility] for lower back Xray

## 2018-11-09 NOTE — Progress Notes (Signed)
Patient: Bryan Nelson Male    DOB: April 11, 1932   85 y.o.   MRN: [id number] Visit Date: 11/09/2018  Today's Provider: Lelon Huh, MD   Chief Complaint  Patient presents with  . Leg Pain   Subjective:     Leg Pain   Incident onset: more than 1 week ago; pain started in his feet and has now spread to his legs. There was no injury mechanism. The pain is present in the left leg and right leg (and both feet). The quality of the pain is described as burning. The pain has been worsening since onset. Pertinent negatives include no numbness. Nothing aggravates the symptoms. He has tried NSAIDs for the symptoms which has not helped. States pain keeps him up at night. Has been taking temazepam every night to try to help sleep. Has not felt weak in his legs.  Also has chronic low back pain but has been more persistent and seems to be lower.   He does have history neuropathy many years and had negative neuro work up by his reports. He states sx were similar to current sx, and sx eventually resolved without treatment. Is still walking every day and pain is not bad when walking, but much worse afterwords  Allergies  Allergen Reactions  . Ciprofloxacin Diarrhea  . Lisinopril Other (See Comments)    Unknown   . Penicillins Swelling    unknwon reaction.  tolerates amoxicillin Has patient had a PCN reaction causing immediate rash, facial/tongue/throat swelling, SOB or lightheadedness with hypotension: No Has patient had a PCN reaction causing severe rash involving mucus membranes or skin necrosis: No Has patient had a PCN reaction that required hospitalization: No Has patient had a PCN reaction occurring within the last 10 years: Yes If all of the above answers are "NO", then may proceed with Cephalosporin use.   . Bactrim [Sulfamethoxazole-Trimethoprim] Rash  . Sulfa Antibiotics Itching and Rash     Current Outpatient Medications:  .  aspirin 81 MG tablet, Take 81 mg by mouth daily.,  Disp: , Rfl:  .  atorvastatin (LIPITOR) 10 MG tablet, TAKE ONE TABLET BY MOUTH ONCE DAILY, Disp: 90 tablet, Rfl: 3 .  benazepril-hydrochlorthiazide (LOTENSIN HCT) 5-6.25 MG tablet, Take 1 tablet by mouth once daily, Disp: 90 tablet, Rfl: 4 .  Cholecalciferol (VITAMIN D3) 1000 units CAPS, Take 1,000 Units by mouth daily., Disp: , Rfl:  .  ciprofloxacin (CIPRO) 500 MG tablet, Take 1 tablet (500 mg total) by mouth 2 (two) times daily., Disp: 20 tablet, Rfl: 0 .  clopidogrel (PLAVIX) 75 MG tablet, TAKE ONE TABLET BY MOUTH ONCE DAILY, Disp: 90 tablet, Rfl: 4 .  docusate sodium (COLACE) 100 MG capsule, Take 1 capsule (100 mg total) by mouth 2 (two) times daily., Disp: 60 capsule, Rfl: 0 .  famotidine (PEPCID) 20 MG tablet, Take 1 tablet (20 mg total) by mouth 2 (two) times daily., Disp: 180 tablet, Rfl: 3 .  finasteride (PROSCAR) 5 MG tablet, Take 1 tablet (5 mg total) by mouth daily., Disp: 90 tablet, Rfl: 3 .  isosorbide dinitrate (ISORDIL) 30 MG tablet, Take 1 tablet (30 mg total) by mouth every morning., Disp: 90 tablet, Rfl: 4 .  Magnesium 250 MG TABS, Take 250 mg by mouth daily., Disp: , Rfl:  .  metoprolol succinate (TOPROL-XL) 25 MG 24 hr tablet, Take 1 tablet (25 mg total) by mouth daily., Disp: 90 tablet, Rfl: 4 .  metroNIDAZOLE (FLAGYL) 500 MG tablet, Take  1 tablet (500 mg total) by mouth 2 (two) times daily for 10 days., Disp: 20 tablet, Rfl: 0 .  Multiple Vitamins-Minerals (MULTIVITAMIN ADULT PO), Take by mouth daily., Disp: , Rfl:  .  Multiple Vitamins-Minerals (VISION FORMULA 2 PO), Take by mouth daily., Disp: , Rfl:  .  nitroGLYCERIN (NITROSTAT) 0.4 MG SL tablet, Place 0.4 mg under the tongue every 5 (five) minutes as needed for chest pain. Reported on 03/10/2016, Disp: , Rfl:  .  omeprazole (PRILOSEC) 40 MG capsule, TAKE ONE CAPSULE BY MOUTH TWICE DAILY, Disp: 180 capsule, Rfl: 4 .  psyllium (METAMUCIL) 58.6 % packet, Take 1 packet by mouth daily., Disp: , Rfl:  .  sucralfate (CARAFATE) 1  g tablet, Take 1 tablet (1 g total) by mouth 2 (two) times daily as needed (heartburn or refllux)., Disp: 60 tablet, Rfl: 12 .  tamsulosin (FLOMAX) 0.4 MG CAPS capsule, TAKE 1 CAPSULE BY MOUTH ONCE DAILY, Disp: 90 capsule, Rfl: 3 .  temazepam (RESTORIL) 30 MG capsule, TAKE 1 CAPSULE BY MOUTH AT BEDTIME AS NEEDED, Disp: 90 capsule, Rfl: 1 .  zolpidem (AMBIEN) 5 MG tablet, TAKE 1 TABLET BY MOUTH AT BEDTIME AS NEEDED FOR SLEEP, Disp: 30 tablet, Rfl: 4  Review of Systems  Constitutional: Negative for appetite change, chills and fever.  Respiratory: Negative for chest tightness, shortness of breath and wheezing.   Cardiovascular: Negative for chest pain and palpitations.  Gastrointestinal: Positive for abdominal pain (left side). Negative for nausea and vomiting.  Musculoskeletal: Positive for arthralgias (both feet), back pain, myalgias (both legs) and neck pain.  Neurological: Negative for numbness.    Social History   Tobacco Use  . Smoking status: Former Smoker    Packs/day: 1.50    Years: 12.00    Pack years: 18.00    Types: Cigarettes    Last attempt to quit: 08/24/1971    Years since quitting: 47.2  . Smokeless tobacco: Never Used  Substance Use Topics  . Alcohol use: No    Alcohol/week: 0.0 standard drinks      Objective:   BP 120/60 (BP Location: Left Arm, Patient Position: Sitting, Cuff Size: Large)   Pulse 84   Temp 97.9 F (36.6 C) (Oral)   Resp 16   Wt 252 lb (114.3 kg)   SpO2 96% Comment: room  air  BMI 33.25 kg/m  Vitals:   11/09/18 0820  BP: 120/60  Pulse: 84  Resp: 16  Temp: 97.9 F (36.6 C)  TempSrc: Oral  SpO2: 96%  Weight: 252 lb (114.3 kg)    Physical Exam  General appearance: alert, well developed, well nourished, cooperative and in no distress Head: Normocephalic, without obvious abnormality, atraumatic Respiratory: Respirations even and unlabored, normal respiratory rate Extremities: No gross deformities Skin: Skin color, texture, turgor  normal. No rashes seen  MS: Appropriate mood and affect. Neurologic: Mental status: Alert, oriented to person, place, and time, thought content appropriate. +5/5 strength both LEs.      Assessment & Plan    1. Stage 3 chronic kidney disease (HCC)  - VITAMIN D 25 Hydroxy (Vit-D Deficiency, Fractures)  2. Numbness and tingling  - Comprehensive metabolic panel - Vitamin V76 Try - gabapentin (NEURONTIN) 300 MG capsule; Take 1 capsule (300 mg total) by mouth at bedtime. Do not take if you take temazepam  Dispense: 30 capsule; Refill: 3  3. Pain in both lower extremities  - Comprehensive metabolic panel - DG Lumbar Spine Complete; Future     Lelon Huh,  MD  [place]

## 2018-11-10 ENCOUNTER — Telehealth: Payer: Self-pay

## 2018-11-10 LAB — COMPREHENSIVE METABOLIC PANEL
A/G RATIO: 2 (ref 1.2–2.2)
ALT: 32 IU/L (ref 0–44)
AST: 22 IU/L (ref 0–40)
Albumin: 4.3 g/dL (ref 3.6–4.6)
Alkaline Phosphatase: 88 IU/L (ref 39–117)
BUN / CREAT RATIO: 17 (ref 10–24)
BUN: 19 mg/dL (ref 8–27)
Bilirubin Total: 0.3 mg/dL (ref 0.0–1.2)
CO2: 24 mmol/L (ref 20–29)
Calcium: 9 mg/dL (ref 8.6–10.2)
Chloride: 101 mmol/L (ref 96–106)
Creatinine, Ser: 1.09 mg/dL (ref 0.76–1.27)
GFR calc Af Amer: 72 mL/min/{1.73_m2} (ref >59)
GFR calc non Af Amer: 62 mL/min/{1.73_m2} (ref >59)
GLOBULIN, TOTAL: 2.2 g/dL (ref 1.5–4.5)
Glucose: 82 mg/dL (ref 65–99)
Potassium: 4.2 mmol/L (ref 3.5–5.2)
Sodium: 140 mmol/L (ref 134–144)
Total Protein: 6.5 g/dL (ref 6.0–8.5)

## 2018-11-10 LAB — VITAMIN D 25 HYDROXY (VIT D DEFICIENCY, FRACTURES): Vit D, 25-Hydroxy: 23.4 ng/mL — ABNORMAL LOW (ref 30.0–100.0)

## 2018-11-10 LAB — VITAMIN B12: Vitamin B-12: 473 pg/mL (ref 232–1245)

## 2018-11-10 NOTE — Telephone Encounter (Signed)
-----   Message from [name], MD sent at 11/10/2018  3:49 PM EDT ----- Vitamin d levels are a little low, but better, rest of labs are normal. Be sure to take vitamin d supplement every day, continue all other medications unchanged.

## 2018-11-10 NOTE — Telephone Encounter (Signed)
Left message to call back  

## 2018-11-13 NOTE — Telephone Encounter (Signed)
Patient called office and was advised of lab results. Patient wanted to know if Dr. Rosanna Randy could change his prescription for Gabapentin? He states that Dr. Rosanna Randy had told him to stop temazepam and start Gabapentin to help with sleep and patient states medication is not helping and that he has been waking up throughout the night. Please advise. KW

## 2018-11-13 NOTE — Telephone Encounter (Signed)
Mistakenly sent to me

## 2018-11-13 NOTE — Telephone Encounter (Signed)
He should increase gabapentin to 2 tablets at bedtime

## 2018-11-13 NOTE — Telephone Encounter (Signed)
Please review. Thanks!  

## 2018-11-13 NOTE — Telephone Encounter (Signed)
He was up at night due the pain in his leg. Is the pain in his leg any better?

## 2018-11-13 NOTE — Telephone Encounter (Signed)
Patient reports that it is do to leg pain. KW

## 2018-11-14 NOTE — Telephone Encounter (Signed)
Pt advised.   Thanks,   -Galan Ghee  

## 2018-11-20 ENCOUNTER — Other Ambulatory Visit: Payer: Self-pay

## 2018-11-20 ENCOUNTER — Encounter: Payer: Self-pay | Admitting: Family Medicine

## 2018-11-20 ENCOUNTER — Ambulatory Visit (INDEPENDENT_AMBULATORY_CARE_PROVIDER_SITE_OTHER): Payer: PPO | Admitting: Family Medicine

## 2018-11-20 VITALS — BP 116/60 | HR 88 | Temp 98.2°F | Resp 16 | Wt 253.0 lb

## 2018-11-20 DIAGNOSIS — K5792 Diverticulitis of intestine, part unspecified, without perforation or abscess without bleeding: Secondary | ICD-10-CM

## 2018-11-20 DIAGNOSIS — R1032 Left lower quadrant pain: Secondary | ICD-10-CM | POA: Diagnosis not present

## 2018-11-20 MED ORDER — CIPROFLOXACIN HCL 500 MG PO TABS: 500.0000 mg | ORAL_TABLET | Freq: Two times a day (BID) | ORAL | 0 refills | Status: AC

## 2018-11-20 MED ORDER — METRONIDAZOLE 500 MG PO TABS: 500.0000 mg | ORAL_TABLET | Freq: Three times a day (TID) | ORAL | 0 refills | Status: DC

## 2018-11-20 NOTE — Patient Instructions (Signed)
.   Please review the attached list of medications and notify my office if there are any errors.   . Please bring all of your medications to every appointment so we can make sure that our medication list is the same as yours.   

## 2018-11-20 NOTE — Progress Notes (Signed)
Patient: Bryan Nelson Male    DOB: May 07, 1932   85 y.o.   MRN: [id number] Visit Date: 11/20/2018  Today's Provider: Lelon Huh, MD   Chief Complaint  Patient presents with  . Abdominal Pain    Lower left  . Testicle Pain    left side   Subjective:     Abdominal Pain  This is a chronic problem. Episode onset: Several months. The problem has been gradually worsening. The pain is located in the LLQ. The abdominal pain does not radiate. Associated symptoms include flatus, frequency and myalgias (bilateral leg pain.  Pt states Gabapentin is not helping.). Pertinent negatives include no anorexia, constipation, diarrhea, dysuria, fever, headaches, hematuria, nausea or vomiting.  Testicle Pain  The patient's primary symptoms include testicular pain. The patient's pertinent negatives include no penile discharge, penile pain or scrotal swelling. This is a new problem. Associated symptoms include abdominal pain (Lower left side) and frequency. Pertinent negatives include no anorexia, constipation, diarrhea, dysuria, fever, flank pain, headaches, nausea, urgency or vomiting. The testicular pain affects the left testicle.  He did have CT  .  He also reports he continues to having burning sensation in left lower leg that keeps him up at night. Has titrating gabapentin up to 600mg  without improvement. Had DDD on xray of back with old compression fractures.   Allergies  Allergen Reactions  . Ciprofloxacin Diarrhea  . Lisinopril Other (See Comments)    Unknown   . Penicillins Swelling    unknwon reaction.  tolerates amoxicillin Has patient had a PCN reaction causing immediate rash, facial/tongue/throat swelling, SOB or lightheadedness with hypotension: No Has patient had a PCN reaction causing severe rash involving mucus membranes or skin necrosis: No Has patient had a PCN reaction that required hospitalization: No Has patient had a PCN reaction occurring within the last 10 years: Yes  If all of the above answers are "NO", then may proceed with Cephalosporin use.   . Bactrim [Sulfamethoxazole-Trimethoprim] Rash  . Sulfa Antibiotics Itching and Rash     Current Outpatient Medications:  .  aspirin 81 MG tablet, Take 81 mg by mouth daily., Disp: , Rfl:  .  benazepril-hydrochlorthiazide (LOTENSIN HCT) 5-6.25 MG tablet, Take 1 tablet by mouth once daily, Disp: 90 tablet, Rfl: 4 .  Cholecalciferol (VITAMIN D3) 1000 units CAPS, Take 1,000 Units by mouth daily., Disp: , Rfl:  .  clopidogrel (PLAVIX) 75 MG tablet, TAKE ONE TABLET BY MOUTH ONCE DAILY, Disp: 90 tablet, Rfl: 4 .  docusate sodium (COLACE) 100 MG capsule, Take 1 capsule (100 mg total) by mouth 2 (two) times daily., Disp: 60 capsule, Rfl: 0 .  finasteride (PROSCAR) 5 MG tablet, Take 1 tablet (5 mg total) by mouth daily., Disp: 90 tablet, Rfl: 3 .  gabapentin (NEURONTIN) 300 MG capsule, Take 1 capsule (300 mg total) by mouth at bedtime. Do not take if you take temazepam, Disp: 30 capsule, Rfl: 3 .  isosorbide dinitrate (ISORDIL) 30 MG tablet, Take 1 tablet (30 mg total) by mouth every morning., Disp: 90 tablet, Rfl: 4 .  metoprolol succinate (TOPROL-XL) 25 MG 24 hr tablet, Take 1 tablet (25 mg total) by mouth daily., Disp: 90 tablet, Rfl: 4 .  Multiple Vitamins-Minerals (MULTIVITAMIN ADULT PO), Take by mouth daily., Disp: , Rfl:  .  Multiple Vitamins-Minerals (VISION FORMULA 2 PO), Take by mouth daily., Disp: , Rfl:  .  nitroGLYCERIN (NITROSTAT) 0.4 MG SL tablet, Place 1 tablet (0.4 mg total) under  the tongue every 5 (five) minutes as needed for chest pain. Reported on 03/10/2016, Disp: 20 tablet, Rfl: 1 .  omeprazole (PRILOSEC) 40 MG capsule, TAKE ONE CAPSULE BY MOUTH TWICE DAILY, Disp: 180 capsule, Rfl: 4 .  sucralfate (CARAFATE) 1 g tablet, Take 1 tablet (1 g total) by mouth 2 (two) times daily as needed (heartburn or refllux)., Disp: 60 tablet, Rfl: 12 .  tamsulosin (FLOMAX) 0.4 MG CAPS capsule, TAKE 1 CAPSULE BY MOUTH  ONCE DAILY, Disp: 90 capsule, Rfl: 3 .  atorvastatin (LIPITOR) 10 MG tablet, TAKE ONE TABLET BY MOUTH ONCE DAILY, Disp: 90 tablet, Rfl: 3 .  ciprofloxacin (CIPRO) 500 MG tablet, Take 1 tablet (500 mg total) by mouth 2 (two) times daily. (Patient not taking: Reported on 11/20/2018), Disp: 20 tablet, Rfl: 0 .  famotidine (PEPCID) 20 MG tablet, Take 1 tablet (20 mg total) by mouth 2 (two) times daily., Disp: 180 tablet, Rfl: 3 .  Magnesium 250 MG TABS, Take 250 mg by mouth daily., Disp: , Rfl:  .  psyllium (METAMUCIL) 58.6 % packet, Take 1 packet by mouth daily., Disp: , Rfl:  .  temazepam (RESTORIL) 30 MG capsule, TAKE 1 CAPSULE BY MOUTH AT BEDTIME AS NEEDED (Patient not taking: Reported on 11/20/2018), Disp: 90 capsule, Rfl: 1  Review of Systems  Constitutional: Negative.  Negative for fever.  Cardiovascular: Negative.   Gastrointestinal: Positive for abdominal pain (Lower left side) and flatus. Negative for abdominal distention, anal bleeding, anorexia, blood in stool, constipation, diarrhea, nausea, rectal pain and vomiting.       Pt reports he has a lot of gas. Especially in the afternoon/evening.    Genitourinary: Positive for frequency and testicular pain. Negative for decreased urine volume, difficulty urinating, discharge, dysuria, enuresis, flank pain, genital sores, hematuria, penile pain, penile swelling, scrotal swelling and urgency.  Musculoskeletal: Positive for myalgias (bilateral leg pain.  Pt states Gabapentin is not helping.).  Neurological: Positive for dizziness. Negative for light-headedness and headaches.    Social History   Tobacco Use  . Smoking status: Former Smoker    Packs/day: 1.50    Years: 12.00    Pack years: 18.00    Types: Cigarettes    Last attempt to quit: 08/24/1971    Years since quitting: 47.2  . Smokeless tobacco: Never Used  Substance Use Topics  . Alcohol use: No    Alcohol/week: 0.0 standard drinks      Objective:   BP 116/60 (BP Location:  Right Arm, Patient Position: Sitting, Cuff Size: Large)   Pulse 88   Temp 98.2 F (36.8 C) (Oral)   Resp 16   Wt 253 lb (114.8 kg)   BMI 33.38 kg/m  Vitals:   11/20/18 0946  BP: 116/60  Pulse: 88  Resp: 16  Temp: 98.2 F (36.8 C)  TempSrc: Oral  Weight: 253 lb (114.8 kg)     Physical Exam  General appearance: alert, well developed, well nourished, cooperative and in no distress Head: Normocephalic, without obvious abnormality, atraumatic Respiratory: Respirations even and unlabored, normal respiratory rate Extremities: No gross deformities Skin: Skin color, texture, turgor normal. No rashes seen  Abd/GU. Tender LLQ, no masses. No testicular swelling or tenderness.      Assessment & Plan    1. Diverticulitis  - metroNIDAZOLE (FLAGYL) 500 MG tablet; Take 1 tablet (500 mg total) by mouth 3 (three) times daily.  Dispense: 20 tablet; Refill: 0 - ciprofloxacin (CIPRO) 500 MG tablet; Take 1 tablet (500 mg total) by  mouth 2 (two) times daily for 10 days.  Dispense: 20 tablet; Refill: 0  2. LLQ pain Persistent for several weeks with signs of sigmoid perforation on CT of 10/18/2018. Has completed antibiotic with improvement, but not resolutions of sx. Discussed options of empiric antibiotic treatment, repeat CT and referral to GI. Considering he is not in any acute distress and non-urgent imaging is not recommended to to Covid-19, will try another course of antibiotic to cover for diverticulitis as above. Will contact patient next week to ensure improvement. Consider repeat imaging if not better.   Reviewed colonoscopy history and he did have adenomatous polyp excise 05/03/2013, but have no record of recommended follow up. Will consider referral back to GI. Marland Kitchen      Lelon Huh, MD  [facility]

## 2018-11-30 ENCOUNTER — Telehealth: Payer: Self-pay | Admitting: Family Medicine

## 2018-11-30 NOTE — Telephone Encounter (Signed)
Patient was seen 11-20-2018 and prescribed antibiotic for suspected diverticulitis. Please check with patient and see if abdominal has improved since then. If not will need to get in with GI.

## 2018-11-30 NOTE — Telephone Encounter (Signed)
-----   Message from [name], MD sent at 11/20/2018  2:57 PM EDT ----- Regarding: call to see if llq pain better on abx. consider gi referral, history of polyps.

## 2018-11-30 NOTE — Telephone Encounter (Signed)
Spoke to patient regarding abdominal pain. He states that he still having some pain. I explained to him that you suggested that he contact Gastro for a follow up if he was still having issues. He will call Dr. Vira Agar to make an appointment.

## 2018-12-05 DIAGNOSIS — R1032 Left lower quadrant pain: Secondary | ICD-10-CM | POA: Diagnosis not present

## 2018-12-06 ENCOUNTER — Telehealth: Payer: Self-pay | Admitting: *Deleted

## 2018-12-06 NOTE — Telephone Encounter (Signed)
Patient called office requesting a referral. Patient states his back pain has gotten worse. Patient states he has seen Dr. Caryn Section for this in the past. Patient stated if he needs to be seen here in office first, just let him know. Please advise?

## 2018-12-12 ENCOUNTER — Other Ambulatory Visit: Payer: Self-pay

## 2018-12-12 ENCOUNTER — Ambulatory Visit: Payer: PPO | Admitting: Family Medicine

## 2018-12-12 ENCOUNTER — Encounter: Payer: Self-pay | Admitting: Family Medicine

## 2018-12-12 ENCOUNTER — Ambulatory Visit (INDEPENDENT_AMBULATORY_CARE_PROVIDER_SITE_OTHER): Payer: PPO | Admitting: Family Medicine

## 2018-12-12 VITALS — BP 119/75 | HR 114 | Temp 98.8°F | Resp 16 | Wt 254.0 lb

## 2018-12-12 DIAGNOSIS — M545 Low back pain: Secondary | ICD-10-CM | POA: Diagnosis not present

## 2018-12-12 DIAGNOSIS — N390 Urinary tract infection, site not specified: Secondary | ICD-10-CM | POA: Diagnosis not present

## 2018-12-12 DIAGNOSIS — M79674 Pain in right toe(s): Secondary | ICD-10-CM

## 2018-12-12 DIAGNOSIS — G8929 Other chronic pain: Secondary | ICD-10-CM

## 2018-12-12 DIAGNOSIS — R319 Hematuria, unspecified: Secondary | ICD-10-CM

## 2018-12-12 DIAGNOSIS — R300 Dysuria: Secondary | ICD-10-CM

## 2018-12-12 LAB — POCT URINALYSIS DIPSTICK
Bilirubin, UA: NEGATIVE
Glucose, UA: NEGATIVE
Ketones, UA: NEGATIVE
Nitrite, UA: POSITIVE
Protein, UA: POSITIVE — AB
Spec Grav, UA: 1.015 (ref 1.010–1.025)
Urobilinogen, UA: 0.2 E.U./dL
pH, UA: 6.5 (ref 5.0–8.0)

## 2018-12-12 MED ORDER — LEVOFLOXACIN 750 MG PO TABS: 750.0000 mg | ORAL_TABLET | Freq: Every day | ORAL | 0 refills | Status: AC

## 2018-12-12 NOTE — Progress Notes (Signed)
Patient: Bryan Nelson Male    DOB: February 12, 1932   85 y.o.   MRN: [id number] Visit Date: 12/12/2018  Today's Provider: Lelon Huh, MD   Chief Complaint  Patient presents with  . Foot Pain  . Dysuria   Subjective:     HPI Toe pain: Patient come in today complaining of pain of the great toe on his right foot. Pain started 2 days ago and has gradually improved. Patient denies any redness, swelling or recent trauma.   Dysuria: Patient complains of burning during urination and urinary frequency for the past 2-3 days.  Allergies  Allergen Reactions  . Ciprofloxacin Diarrhea  . Lisinopril Other (See Comments)    Unknown   . Penicillins Swelling    unknwon reaction.  tolerates amoxicillin Has patient had a PCN reaction causing immediate rash, facial/tongue/throat swelling, SOB or lightheadedness with hypotension: No Has patient had a PCN reaction causing severe rash involving mucus membranes or skin necrosis: No Has patient had a PCN reaction that required hospitalization: No Has patient had a PCN reaction occurring within the last 10 years: Yes If all of the above answers are "NO", then may proceed with Cephalosporin use.   . Bactrim [Sulfamethoxazole-Trimethoprim] Rash  . Sulfa Antibiotics Itching and Rash     Current Outpatient Medications:  .  aspirin 81 MG tablet, Take 81 mg by mouth daily., Disp: , Rfl:  .  atorvastatin (LIPITOR) 10 MG tablet, TAKE ONE TABLET BY MOUTH ONCE DAILY, Disp: 90 tablet, Rfl: 3 .  benazepril-hydrochlorthiazide (LOTENSIN HCT) 5-6.25 MG tablet, Take 1 tablet by mouth once daily, Disp: 90 tablet, Rfl: 4 .  Cholecalciferol (VITAMIN D3) 1000 units CAPS, Take 1,000 Units by mouth daily., Disp: , Rfl:  .  clopidogrel (PLAVIX) 75 MG tablet, TAKE ONE TABLET BY MOUTH ONCE DAILY, Disp: 90 tablet, Rfl: 4 .  famotidine (PEPCID) 20 MG tablet, Take 1 tablet (20 mg total) by mouth 2 (two) times daily., Disp: 180 tablet, Rfl: 3 .  finasteride (PROSCAR) 5  MG tablet, Take 1 tablet (5 mg total) by mouth daily., Disp: 90 tablet, Rfl: 3 .  isosorbide dinitrate (ISORDIL) 30 MG tablet, Take 1 tablet (30 mg total) by mouth every morning., Disp: 90 tablet, Rfl: 4 .  Magnesium 250 MG TABS, Take 250 mg by mouth daily., Disp: , Rfl:  .  metoprolol succinate (TOPROL-XL) 25 MG 24 hr tablet, Take 1 tablet (25 mg total) by mouth daily., Disp: 90 tablet, Rfl: 4 .  Multiple Vitamins-Minerals (MULTIVITAMIN ADULT PO), Take by mouth daily., Disp: , Rfl:  .  Multiple Vitamins-Minerals (VISION FORMULA 2 PO), Take by mouth daily., Disp: , Rfl:  .  nitroGLYCERIN (NITROSTAT) 0.4 MG SL tablet, Place 1 tablet (0.4 mg total) under the tongue every 5 (five) minutes as needed for chest pain. Reported on 03/10/2016, Disp: 20 tablet, Rfl: 1 .  omeprazole (PRILOSEC) 40 MG capsule, TAKE ONE CAPSULE BY MOUTH TWICE DAILY, Disp: 180 capsule, Rfl: 4 .  sucralfate (CARAFATE) 1 g tablet, Take 1 tablet (1 g total) by mouth 2 (two) times daily as needed (heartburn or refllux)., Disp: 60 tablet, Rfl: 12 .  tamsulosin (FLOMAX) 0.4 MG CAPS capsule, TAKE 1 CAPSULE BY MOUTH ONCE DAILY, Disp: 90 capsule, Rfl: 3 .  temazepam (RESTORIL) 30 MG capsule, TAKE 1 CAPSULE BY MOUTH AT BEDTIME AS NEEDED, Disp: 90 capsule, Rfl: 1 .  docusate sodium (COLACE) 100 MG capsule, Take 1 capsule (100 mg total) by mouth  2 (two) times daily. (Patient not taking: Reported on 12/12/2018), Disp: 60 capsule, Rfl: 0 .  gabapentin (NEURONTIN) 300 MG capsule, Take 1 capsule (300 mg total) by mouth at bedtime. Do not take if you take temazepam (Patient not taking: Reported on 12/12/2018), Disp: 30 capsule, Rfl: 3 .  psyllium (METAMUCIL) 58.6 % packet, Take 1 packet by mouth daily., Disp: , Rfl:   Review of Systems  Constitutional: Negative for appetite change, chills and fever.  Respiratory: Negative for chest tightness, shortness of breath and wheezing.   Cardiovascular: Negative for chest pain and palpitations.   Gastrointestinal: Negative for abdominal pain, nausea and vomiting.  Genitourinary: Positive for dysuria and frequency.  Musculoskeletal:       Pain in great toe of right foot    Social History   Tobacco Use  . Smoking status: Former Smoker    Packs/day: 1.50    Years: 12.00    Pack years: 18.00    Types: Cigarettes    Last attempt to quit: 08/24/1971    Years since quitting: 47.3  . Smokeless tobacco: Never Used  Substance Use Topics  . Alcohol use: No    Alcohol/week: 0.0 standard drinks      Objective:   BP 119/75 (BP Location: Right Arm, Patient Position: Sitting, Cuff Size: Large)   Pulse (!) 114   Temp 98.8 F (37.1 C) (Oral)   Resp 16   Wt 254 lb (115.2 kg)   BMI 33.51 kg/m  Vitals:   12/12/18 0815  BP: 119/75  Pulse: (!) 114  Resp: 16  Temp: 98.8 F (37.1 C)  TempSrc: Oral  Weight: 254 lb (115.2 kg)     Physical Exam  General appearance: alert, well developed, well nourished, cooperative and in no distress Head: Normocephalic, without obvious abnormality, atraumatic Respiratory: Respirations even and unlabored, normal respiratory rate Extremities: Slight tenderness and pain with movement of right first MTP joint. No swelling or tenderness.   Results for orders placed or performed in visit on 12/12/18  POCT Urinalysis Dipstick  Result Value Ref Range   Color, UA yellow    Clarity, UA cloudy    Glucose, UA Negative Negative   Bilirubin, UA negative    Ketones, UA negative    Spec Grav, UA 1.015 1.010 - 1.025   Blood, UA Large (hemolyzed)    pH, UA 6.5 5.0 - 8.0   Protein, UA Positive (A) Negative   Urobilinogen, UA 0.2 0.2 or 1.0 E.U./dL   Nitrite, UA positive    Leukocytes, UA Large (3+) (A) Negative   Appearance     Odor         Assessment & Plan    1. Dysuria  - POCT Urinalysis Dipstick  2. Urinary tract infection with hematuria, site unspecified  - Urine Culture - levofloxacin (LEVAQUIN) 750 MG tablet; Take 1 tablet (750 mg  total) by mouth daily for 5 days.  Dispense: 5 tablet; Refill: 0  3. Chronic midline low back pain without sciatica  4. Great toe pain, right He is concerned about although he has never had gout and there is no swelling or inflammation. He is not candidate for NSAIDs due to being on clopidogrel consider colchicine if uric acid is elevated.  - Uric acid     Lelon Huh, MD  [facility]

## 2018-12-12 NOTE — Telephone Encounter (Signed)
Seen in office on 12/12/2018

## 2018-12-12 NOTE — Patient Instructions (Signed)
.   Please review the attached list of medications and notify my office if there are any errors.   . Please bring all of your medications to every appointment so we can make sure that our medication list is the same as yours.    A chiropractic treatment may help your back. I recommend [facility]

## 2018-12-13 LAB — URIC ACID: Uric Acid: 5.9 mg/dL (ref 3.7–8.6)

## 2018-12-14 LAB — URINE CULTURE

## 2018-12-15 ENCOUNTER — Telehealth: Payer: Self-pay | Admitting: Family Medicine

## 2018-12-15 MED ORDER — NITROFURANTOIN MONOHYD MACRO 100 MG PO CAPS: 100.0000 mg | ORAL_CAPSULE | Freq: Two times a day (BID) | ORAL | 0 refills | Status: DC

## 2018-12-15 NOTE — Telephone Encounter (Signed)
Pt was in on Monday for a UTI.  He is still having symptoms of frequent urination, burning is a little better.  He is on his last dose of antibiotic.    CB  [phone]  Thanks Con Memos

## 2018-12-15 NOTE — Telephone Encounter (Signed)
-----   Message from [name], MD sent at 12/15/2018  8:03 AM EDT ----- Culture is positive for e.coli which is resistant to levofloxacin. Need to change antibiotic to nitrofurantoin 100mg  two x daily for 14 days, #28.

## 2018-12-15 NOTE — Telephone Encounter (Signed)
Pt advised.  RX sent to [facility].   Thanks,   -Mickel Baas

## 2018-12-29 ENCOUNTER — Telehealth: Payer: Self-pay | Admitting: Family Medicine

## 2018-12-29 DIAGNOSIS — R200 Anesthesia of skin: Secondary | ICD-10-CM

## 2018-12-29 MED ORDER — GABAPENTIN 300 MG PO CAPS: 300.0000 mg | ORAL_CAPSULE | Freq: Every day | ORAL | 3 refills | Status: DC

## 2018-12-29 NOTE — Telephone Encounter (Signed)
Patient was prescribed nitrofurantoin two weeks ago for recurrent UTI. Please call and make sure sx have resolved since finishing antibiotic.

## 2018-12-29 NOTE — Telephone Encounter (Signed)
Patient stated he has 2 more nitrofurantoin left but the symptoms seem to have resolved. Patient also wanted to get a refill for gabapentin 300 mg. Patient stated taking gabapentin bid helped him. [place] Please advise?

## 2018-12-29 NOTE — Telephone Encounter (Signed)
Left message to call back  

## 2019-01-01 ENCOUNTER — Other Ambulatory Visit: Payer: Self-pay | Admitting: Family Medicine

## 2019-01-12 ENCOUNTER — Ambulatory Visit: Payer: Self-pay | Admitting: Family Medicine

## 2019-01-28 ENCOUNTER — Other Ambulatory Visit: Payer: Self-pay | Admitting: Family Medicine

## 2019-01-30 ENCOUNTER — Other Ambulatory Visit: Payer: Self-pay | Admitting: Family Medicine

## 2019-01-30 DIAGNOSIS — R200 Anesthesia of skin: Secondary | ICD-10-CM

## 2019-01-30 DIAGNOSIS — R202 Paresthesia of skin: Secondary | ICD-10-CM

## 2019-01-30 MED ORDER — GABAPENTIN 300 MG PO CAPS: 300.0000 mg | ORAL_CAPSULE | Freq: Every day | ORAL | 6 refills | Status: DC

## 2019-01-30 NOTE — Telephone Encounter (Signed)
Patient is requesting refills on Gabapentin 300 mg. Sent to [facility] on [address].

## 2019-02-01 ENCOUNTER — Other Ambulatory Visit: Payer: Self-pay

## 2019-02-01 ENCOUNTER — Other Ambulatory Visit: Payer: Self-pay | Admitting: Oncology

## 2019-02-01 ENCOUNTER — Ambulatory Visit
Admission: RE | Admit: 2019-02-01 | Discharge: 2019-02-01 | Disposition: A | Discharge status: Home / Self Care | Payer: PPO | Source: Ambulatory Visit | Attending: Oncology | Admitting: Oncology

## 2019-02-01 DIAGNOSIS — K5730 Diverticulosis of large intestine without perforation or abscess without bleeding: Secondary | ICD-10-CM | POA: Diagnosis not present

## 2019-02-01 DIAGNOSIS — N1330 Unspecified hydronephrosis: Secondary | ICD-10-CM | POA: Diagnosis not present

## 2019-02-01 DIAGNOSIS — C8330 Diffuse large B-cell lymphoma, unspecified site: Secondary | ICD-10-CM

## 2019-02-01 LAB — POCT I-STAT CREATININE: Creatinine, Ser: 2 mg/dL — ABNORMAL HIGH (ref 0.61–1.24)

## 2019-02-01 MED ORDER — IOHEXOL 300 MG/ML  SOLN
100.0000 mL | Freq: Once | INTRAMUSCULAR | Status: AC | PRN
  Administered 2019-02-01: 11:00:00 via INTRAVENOUS

## 2019-02-04 NOTE — Progress Notes (Signed)
[facility]  [phone]  [phone]     Bryan Nelson DOB: 1931/10/07  MR#: [id number]  IPJ#:[id number]  Patient Care Team: [name], MD as PCP - General (Family Medicine) Dingeldein, Remo Lipps, MD as Consulting Physician (Ophthalmology) Hollice Espy, MD as Consulting Physician (Urology) Yolonda Kida, MD as Consulting Physician (Cardiology) Laneta Simmers as Physician Assistant (Urology)  I connected with Bryan Nelson on 02/07/19 at 10:15 AM EDT by telephone visit and verified that I am speaking with the correct person using two identifiers.   I discussed the limitations, risks, security and privacy concerns of performing an evaluation and management service by telemedicine and the availability of in-person appointments. I also discussed with the patient that there may be a patient responsible charge related to this service. The patient expressed understanding and agreed to proceed.   Other persons participating in the visit and their role in the encounter: Patient, MD  Patient's location: Clinic Provider's location: Home  CHIEF COMPLAINT: History of diffuse large B-cell lymphoma.  INTERVAL HISTORY: Patient agreed to evaluation via telephone to discuss his imaging results.  He no longer has any flank pain.  He currently feels well and is asymptomatic.  He has no neurologic complaints.  He denies any recent fevers or illnesses.  He has no night sweats or unintentional weight loss.  He denies any chest pain, shortness of breath, cough, or hemoptysis.  He has no nausea, vomiting, constipation, or diarrhea.  He denies any melena or hematochezia.  He is currently on antibiotics for UTI.  Patient otherwise feels well and offers no further specific complaints today.  REVIEW OF SYSTEMS:   Review of Systems  Constitutional: Negative.  Negative for diaphoresis, fever, malaise/fatigue and weight loss.  Respiratory: Negative.  Negative for  cough and shortness of breath.   Cardiovascular: Negative.  Negative for chest pain and leg swelling.  Gastrointestinal: Negative.  Negative for abdominal pain, blood in stool and melena.  Genitourinary: Negative.  Negative for dysuria, frequency, hematuria and urgency.  Musculoskeletal: Negative.  Negative for joint pain.  Skin: Negative.  Negative for rash.  Neurological: Negative.  Negative for dizziness, focal weakness, weakness and headaches.  Psychiatric/Behavioral: Negative.  The patient is not nervous/anxious.     As per HPI. Otherwise, a complete review of systems is negative.  ONCOLOGY HISTORY: Oncology History   No history exists.    PAST MEDICAL HISTORY: Past Medical History:  Diagnosis Date  . Arteriosclerosis of coronary artery 08/26/2011   Overview:      a.  1999 PCI of the mid LAD with stent.      b.  2002 Cath [facility]: EF 56%. RCA-distal 25/25%. Left main-50% ostial.  Left circumflex-25% OM2.  LAD-25% proximal.  75% mid.  25/25% distal. 75% D1.      c.  07/2011 PCI of RCA with DES.[place]   . Bleeding gastrointestinal 08/26/2011   Overview:      a.  Chronic abdominal pain, present improving.      b.  Duodenitis and gastritis by EGD in 2000.      c.  Pylori in 2000, treated.      d.  Gastroesophageal reflux disease.      e.  Diverticular disease.    . BP (high blood pressure) 08/26/2011  . Closed dislocation of right ankle 09/10/2016   Successfully reduced in ER 09/10/2016  . Diffuse large B-cell lymphoma ([place]) 2015   chemo tx's  . GERD (gastroesophageal  reflux disease)   . Heart disease   . Helicobacter pylori infection 06/18/2015   by EGD RX 08/18/1999   . History of adenomatous polyp of colon 06/18/2015  . HOH (hard of hearing)    Bilateral Hearing  Aids  . Malleolar fracture (Right) 09/10/2016    PAST SURGICAL HISTORY: Past Surgical History:  Procedure Laterality Date  . ANGIOPLASTY    . CATARACT EXTRACTION    . COLONOSCOPY  2016  . CORONARY ANGIOPLASTY    .  EYE SURGERY Bilateral    Cataract Extraction with IOL  . GREEN LIGHT LASER TURP (TRANSURETHRAL RESECTION OF PROSTATE N/A 04/16/2015   Procedure: GREEN LIGHT LASER TURP (TRANSURETHRAL RESECTION OF PROSTATE WITH BLADDER BIOPSY;  Surgeon: Collier Flowers, MD;  Location: [facility] ORS;  Service: Urology;  Laterality: N/A;  . heart stent placement     1998, 2000, 2012  . TRANSURETHRAL RESECTION OF PROSTATE N/A 03/02/2017   Procedure: TRANSURETHRAL RESECTION OF THE PROSTATE (TURP);  Surgeon: Hollice Espy, MD;  Location: [facility] ORS;  Service: Urology;  Laterality: N/A;    FAMILY HISTORY Family History  Problem Relation Age of Onset  . Osteoporosis Mother   . Alzheimer's disease Father   . Kidney disease Neg Hx   . Prostate cancer Neg Hx   . Bladder Cancer Neg Hx   . Kidney cancer Neg Hx    AVANCED DIRECTIVES:    HEALTH MAINTENANCE: Social History   Tobacco Use  . Smoking status: Former Smoker    Packs/day: 1.50    Years: 12.00    Pack years: 18.00    Types: Cigarettes    Quit date: 08/24/1971    Years since quitting: 47.4  . Smokeless tobacco: Never Used  Substance Use Topics  . Alcohol use: No    Alcohol/week: 0.0 standard drinks  . Drug use: No    Allergies  Allergen Reactions  . Ciprofloxacin Diarrhea  . Lisinopril Other (See Comments)    Unknown   . Penicillins Swelling    unknwon reaction.  tolerates amoxicillin Has patient had a PCN reaction causing immediate rash, facial/tongue/throat swelling, SOB or lightheadedness with hypotension: No Has patient had a PCN reaction causing severe rash involving mucus membranes or skin necrosis: No Has patient had a PCN reaction that required hospitalization: No Has patient had a PCN reaction occurring within the last 10 years: Yes If all of the above answers are "NO", then may proceed with Cephalosporin use.   . Bactrim [Sulfamethoxazole-Trimethoprim] Rash  . Sulfa Antibiotics Itching and Rash    Current Outpatient Medications   Medication Sig Dispense Refill  . atorvastatin (LIPITOR) 10 MG tablet TAKE ONE TABLET BY MOUTH ONCE DAILY 90 tablet 3  . benazepril-hydrochlorthiazide (LOTENSIN HCT) 5-6.25 MG tablet Take 1 tablet by mouth once daily 90 tablet 4  . Cholecalciferol (VITAMIN D3) 1000 units CAPS Take 1,000 Units by mouth daily.    . clopidogrel (PLAVIX) 75 MG tablet Take 1 tablet by mouth once daily 90 tablet 4  . famotidine (PEPCID) 20 MG tablet Take 1 tablet (20 mg total) by mouth 2 (two) times daily. 180 tablet 3  . finasteride (PROSCAR) 5 MG tablet Take 1 tablet (5 mg total) by mouth daily. 90 tablet 3  . gabapentin (NEURONTIN) 300 MG capsule Take 1 capsule (300 mg total) by mouth at bedtime. Do not take if you take temazepam 30 capsule 6  . isosorbide dinitrate (ISORDIL) 30 MG tablet Take 1 tablet (30 mg total) by mouth every morning.  90 tablet 4  . Magnesium 250 MG TABS Take 250 mg by mouth daily.    . metoprolol succinate (TOPROL-XL) 25 MG 24 hr tablet Take 1 tablet (25 mg total) by mouth daily. 90 tablet 4  . Multiple Vitamins-Minerals (MULTIVITAMIN ADULT PO) Take by mouth daily.    . Multiple Vitamins-Minerals (VISION FORMULA 2 PO) Take by mouth daily.    . nitrofurantoin, macrocrystal-monohydrate, (MACROBID) 100 MG capsule Take 1 capsule (100 mg total) by mouth 2 (two) times daily for 10 days. 20 capsule 0  . nitroGLYCERIN (NITROSTAT) 0.4 MG SL tablet Place 1 tablet (0.4 mg total) under the tongue every 5 (five) minutes as needed for chest pain. Reported on 03/10/2016 20 tablet 1  . psyllium (METAMUCIL) 58.6 % packet Take 1 packet by mouth daily.     No current facility-administered medications for this visit.     OBJECTIVE: There were no vitals taken for this visit.   There is no height or weight on file to calculate BMI.    ECOG FS:0 - Asymptomatic   LAB RESULTS:  Office Visit on 02/05/2019  Component Date Value Ref Range Status  . Glucose, UA 02/05/2019 Negative  Negative Final  . Bilirubin, UA  02/05/2019 Negative   Final  . Ketones, UA 02/05/2019 Negative   Final  . Spec Grav, UA 02/05/2019 1.010  1.010 - 1.025 Final  . Blood, UA 02/05/2019 Moderate   Final  . pH, UA 02/05/2019 5.0  5.0 - 8.0 Final  . Protein, UA 02/05/2019 Positive* Negative Final  . Urobilinogen, UA 02/05/2019 0.2  0.2 or 1.0 E.U./dL Final  . Nitrite, UA 02/05/2019 Positive   Final  . Leukocytes, UA 02/05/2019 Large (3+)* Negative Final    STUDIES: No results found.   ASSESSMENT: History of diffuse large B-cell lymphoma.  PLAN:    1. Diffuse Large B Cell Lymphoma: Patient was initially diagnosed in August 2015, pathology was noted to be anaplastic subtype.  He completed 6 cycles of R-CHOP chemotherapy in January 2016.  PET scan January 2017 revealed no evidence of disease.  CT scan of abdomen and pelvis on October 18, 2018 reviewed independently revealing inflammatory changes in the sigmoid colon as well as suspected secondary cystitis with inflammatory changes involving the bladder diverticulum.  Patient also noted to have mildly progressive left external iliac nodes measuring approximately 16 mm.  CT scan from February 01, 2019 reviewed independently with a 5 cm soft tissue mass along the left pelvic sidewall incorporating and obstructing the left ureter.  Unclear if this is a possible recurrence of lymphoma or other significant pathology.  Case discussed with radiology.  Will further discuss images at case conference later this week to determine if biopsy is possible.  Will call patient after case conference with appropriate follow-up.   2.  UTI: Continue current antibiotics.  Possibly obstructive given mass seen on CT scan is obstructing the left ureter. 2.  Renal insufficiency: Patient's creatinine has trended up and is now 2.0.  This is also possibility to the obstructing lesion seen on CT scan.   I provided 25 minutes of non face-to-face telephone visit time during this encounter, and > 50% was spent  counseling as documented under my assessment & plan.   Patient expressed understanding and was in agreement with this plan. He also understands that He can call clinic at any time with any questions, concerns, or complaints.    Lloyd Huger, MD   02/07/2019 6:42 AM

## 2019-02-05 ENCOUNTER — Encounter: Payer: Self-pay | Admitting: Family Medicine

## 2019-02-05 ENCOUNTER — Other Ambulatory Visit: Payer: Self-pay

## 2019-02-05 ENCOUNTER — Ambulatory Visit (INDEPENDENT_AMBULATORY_CARE_PROVIDER_SITE_OTHER): Payer: PPO | Admitting: Family Medicine

## 2019-02-05 VITALS — BP 124/80 | HR 72 | Temp 97.7°F | Resp 16 | Wt 254.0 lb

## 2019-02-05 DIAGNOSIS — N3090 Cystitis, unspecified without hematuria: Secondary | ICD-10-CM

## 2019-02-05 DIAGNOSIS — N390 Urinary tract infection, site not specified: Secondary | ICD-10-CM | POA: Diagnosis not present

## 2019-02-05 DIAGNOSIS — R350 Frequency of micturition: Secondary | ICD-10-CM | POA: Diagnosis not present

## 2019-02-05 LAB — POCT URINALYSIS DIPSTICK
Bilirubin, UA: NEGATIVE
Glucose, UA: NEGATIVE
Ketones, UA: NEGATIVE
Nitrite, UA: POSITIVE
Protein, UA: POSITIVE — AB
Spec Grav, UA: 1.01 (ref 1.010–1.025)
Urobilinogen, UA: 0.2 E.U./dL
pH, UA: 5 (ref 5.0–8.0)

## 2019-02-05 MED ORDER — NITROFURANTOIN MONOHYD MACRO 100 MG PO CAPS: 100.0000 mg | ORAL_CAPSULE | Freq: Two times a day (BID) | ORAL | 0 refills | Status: AC

## 2019-02-05 NOTE — Patient Instructions (Signed)
.   Please review the attached list of medications and notify my office if there are any errors.   . Please bring all of your medications to every appointment so we can make sure that our medication list is the same as yours.    Try lactose-free dairy products for 2 weeks. If that doesn't help then try OTC Probiotic such as Electronics engineer

## 2019-02-05 NOTE — Progress Notes (Signed)
Patient: Bryan Nelson Male    DOB: January 22, 1932   85 y.o.   MRN: [id number] Visit Date: 02/05/2019  Today's Provider: Lelon Huh, MD   Chief Complaint  Patient presents with  . Urinary Tract Infection   Subjective:     Urinary Tract Infection  This is a new problem. The current episode started in the past 7 days. The problem has been gradually worsening. The patient is experiencing no pain. There has been no fever. Associated symptoms include frequency and urgency. Pertinent negatives include no chills, flank pain, hematuria, hesitancy or nausea. His past medical history is significant for catheterization.    Allergies  Allergen Reactions  . Ciprofloxacin Diarrhea  . Lisinopril Other (See Comments)    Unknown   . Penicillins Swelling    unknwon reaction.  tolerates amoxicillin Has patient had a PCN reaction causing immediate rash, facial/tongue/throat swelling, SOB or lightheadedness with hypotension: No Has patient had a PCN reaction causing severe rash involving mucus membranes or skin necrosis: No Has patient had a PCN reaction that required hospitalization: No Has patient had a PCN reaction occurring within the last 10 years: Yes If all of the above answers are "NO", then may proceed with Cephalosporin use.   . Bactrim [Sulfamethoxazole-Trimethoprim] Rash  . Sulfa Antibiotics Itching and Rash     Current Outpatient Medications:  .  atorvastatin (LIPITOR) 10 MG tablet, TAKE ONE TABLET BY MOUTH ONCE DAILY, Disp: 90 tablet, Rfl: 3 .  Cholecalciferol (VITAMIN D3) 1000 units CAPS, Take 1,000 Units by mouth daily., Disp: , Rfl:  .  clopidogrel (PLAVIX) 75 MG tablet, Take 1 tablet by mouth once daily, Disp: 90 tablet, Rfl: 4 .  famotidine (PEPCID) 20 MG tablet, Take 1 tablet (20 mg total) by mouth 2 (two) times daily., Disp: 180 tablet, Rfl: 3 .  finasteride (PROSCAR) 5 MG tablet, Take 1 tablet (5 mg total) by mouth daily., Disp: 90 tablet, Rfl: 3 .  gabapentin  (NEURONTIN) 300 MG capsule, Take 1 capsule (300 mg total) by mouth at bedtime. Do not take if you take temazepam, Disp: 30 capsule, Rfl: 6 .  isosorbide dinitrate (ISORDIL) 30 MG tablet, Take 1 tablet (30 mg total) by mouth every morning., Disp: 90 tablet, Rfl: 4 .  Magnesium 250 MG TABS, Take 250 mg by mouth daily., Disp: , Rfl:  .  metoprolol succinate (TOPROL-XL) 25 MG 24 hr tablet, Take 1 tablet (25 mg total) by mouth daily., Disp: 90 tablet, Rfl: 4 .  Multiple Vitamins-Minerals (MULTIVITAMIN ADULT PO), Take by mouth daily., Disp: , Rfl:  .  Multiple Vitamins-Minerals (VISION FORMULA 2 PO), Take by mouth daily., Disp: , Rfl:  .  tamsulosin (FLOMAX) 0.4 MG CAPS capsule, TAKE 1 CAPSULE BY MOUTH ONCE DAILY, Disp: 90 capsule, Rfl: 3 .  aspirin 81 MG tablet, Take 81 mg by mouth daily., Disp: , Rfl:  .  benazepril-hydrochlorthiazide (LOTENSIN HCT) 5-6.25 MG tablet, Take 1 tablet by mouth once daily, Disp: 90 tablet, Rfl: 4 .  docusate sodium (COLACE) 100 MG capsule, Take 1 capsule (100 mg total) by mouth 2 (two) times daily. (Patient not taking: Reported on 12/12/2018), Disp: 60 capsule, Rfl: 0 .  nitroGLYCERIN (NITROSTAT) 0.4 MG SL tablet, Place 1 tablet (0.4 mg total) under the tongue every 5 (five) minutes as needed for chest pain. Reported on 03/10/2016, Disp: 20 tablet, Rfl: 1 .  omeprazole (PRILOSEC) 40 MG capsule, Take 1 capsule by mouth twice daily (Patient not taking:  Reported on 02/05/2019), Disp: 180 capsule, Rfl: 4 .  psyllium (METAMUCIL) 58.6 % packet, Take 1 packet by mouth daily., Disp: , Rfl:  .  sucralfate (CARAFATE) 1 g tablet, Take 1 tablet (1 g total) by mouth 2 (two) times daily as needed (heartburn or refllux)., Disp: 60 tablet, Rfl: 12 .  temazepam (RESTORIL) 30 MG capsule, TAKE 1 CAPSULE BY MOUTH AT BEDTIME AS NEEDED, Disp: 90 capsule, Rfl: 1  Review of Systems  Constitutional: Negative.  Negative for chills.  Gastrointestinal: Negative.  Negative for nausea.  Genitourinary:  Positive for frequency and urgency. Negative for decreased urine volume, difficulty urinating, discharge, dysuria, enuresis, flank pain, genital sores, hematuria, hesitancy, penile pain, penile swelling, scrotal swelling and testicular pain.    Social History   Tobacco Use  . Smoking status: Former Smoker    Packs/day: 1.50    Years: 12.00    Pack years: 18.00    Types: Cigarettes    Quit date: 08/24/1971    Years since quitting: 47.4  . Smokeless tobacco: Never Used  Substance Use Topics  . Alcohol use: No    Alcohol/week: 0.0 standard drinks      Objective:   BP 124/80 (BP Location: Right Arm, Patient Position: Sitting, Cuff Size: Large)   Pulse 72   Temp 97.7 F (36.5 C) (Oral)   Resp 16   Wt 254 lb (115.2 kg)   BMI 33.51 kg/m  Vitals:   02/05/19 1448  BP: 124/80  Pulse: 72  Resp: 16  Temp: 97.7 F (36.5 C)  TempSrc: Oral  Weight: 254 lb (115.2 kg)     Physical Exam  General appearance: alert, well developed, well nourished, cooperative and in no distress Head: Normocephalic, without obvious abnormality, atraumatic Respiratory: Respirations even and unlabored, normal respiratory rate Extremities: No gross deformities Skin: Skin color, texture, turgor normal. No rashes seen  Psych: Appropriate mood and affect. Neurologic: Mental status: Alert, oriented to person, place, and time, thought content appropriate. Results for orders placed or performed in visit on 02/05/19  POCT urinalysis dipstick  Result Value Ref Range   Color, UA     Clarity, UA     Glucose, UA Negative Negative   Bilirubin, UA Negative    Ketones, UA Negative    Spec Grav, UA 1.010 1.010 - 1.025   Blood, UA Moderate    pH, UA 5.0 5.0 - 8.0   Protein, UA Positive (A) Negative   Urobilinogen, UA 0.2 0.2 or 1.0 E.U./dL   Nitrite, UA Positive    Leukocytes, UA Large (3+) (A) Negative   Appearance     Odor         Assessment & Plan    1. Frequency of micturition  - POCT urinalysis  dipstick  2. Cystitis  - CULTURE, URINE COMPREHENSIVE  3. Recurrent UTI He states he checks urine residual at least once a week and that there is very little. Will consider prophylactic antibiotic when finished with antibiotic and urine culture is complete.     The entirety of the information documented in the History of Present Illness, Review of Systems and Physical Exam were personally obtained by me. Portions of this information were initially documented by Ashley Royalty, CMA and reviewed by me for thoroughness and accuracy.    Lelon Huh, MD  [facility]

## 2019-02-06 ENCOUNTER — Inpatient Hospital Stay: Payer: PPO | Attending: Oncology | Admitting: Oncology

## 2019-02-06 DIAGNOSIS — Z87891 Personal history of nicotine dependence: Secondary | ICD-10-CM | POA: Diagnosis not present

## 2019-02-06 DIAGNOSIS — C8330 Diffuse large B-cell lymphoma, unspecified site: Secondary | ICD-10-CM | POA: Diagnosis not present

## 2019-02-06 NOTE — Progress Notes (Signed)
Patient denies any concerns today.  

## 2019-02-08 ENCOUNTER — Other Ambulatory Visit: Payer: Self-pay | Admitting: *Deleted

## 2019-02-08 ENCOUNTER — Encounter: Payer: Self-pay | Admitting: *Deleted

## 2019-02-08 ENCOUNTER — Other Ambulatory Visit: Payer: PPO

## 2019-02-08 DIAGNOSIS — Z8579 Personal history of other malignant neoplasms of lymphoid, hematopoietic and related tissues: Secondary | ICD-10-CM

## 2019-02-08 NOTE — Progress Notes (Signed)
Tumor Board Documentation  Bryan Nelson was presented by Dr Grayland Ormond at our Tumor Board on 02/08/2019, which included representatives from medical oncology, radiation oncology, surgical oncology, surgical, radiology, pathology, internal medicine, navigation, research.  Bryan Nelson currently presents as a current patient, for discussion, for new tumor(s) with history of the following treatments: active survellience, neoadjuvant chemotherapy.  Additionally, we reviewed previous medical and familial history, history of present illness, and recent lab results along with all available histopathologic and imaging studies. The tumor board considered available treatment options and made the following recommendations: Biopsy, Additional screening CT biopsy, PET Scan  The following procedures/referrals were also placed: No orders of the defined types were placed in this encounter.   Clinical Trial Status: not discussed   Staging used: AJCC Stage Group  AJCC Staging:       Group: Stage 3 Anaplastic Lymphoma   National site-specific guidelines   were discussed with respect to the case.  Tumor board is a meeting of clinicians from various specialty areas who evaluate and discuss patients for whom a multidisciplinary approach is being considered. Final determinations in the plan of care are those of the provider(s). The responsibility for follow up of recommendations given during tumor board is that of the provider.   Today's extended care, comprehensive team conference, Bryan Nelson was not present for the discussion and was not examined.   Multidisciplinary Tumor Board is a multidisciplinary case peer review process.  Decisions discussed in the Multidisciplinary Tumor Board reflect the opinions of the specialists present at the conference without having examined the patient.  Ultimately, treatment and diagnostic decisions rest with the primary provider(s) and the patient.

## 2019-02-11 LAB — SPECIMEN STATUS REPORT

## 2019-02-11 LAB — CULTURE, URINE COMPREHENSIVE

## 2019-02-12 DIAGNOSIS — R0602 Shortness of breath: Secondary | ICD-10-CM | POA: Diagnosis not present

## 2019-02-12 DIAGNOSIS — R011 Cardiac murmur, unspecified: Secondary | ICD-10-CM | POA: Diagnosis not present

## 2019-02-12 DIAGNOSIS — E782 Mixed hyperlipidemia: Secondary | ICD-10-CM | POA: Diagnosis not present

## 2019-02-12 DIAGNOSIS — R42 Dizziness and giddiness: Secondary | ICD-10-CM | POA: Diagnosis not present

## 2019-02-12 DIAGNOSIS — E669 Obesity, unspecified: Secondary | ICD-10-CM | POA: Diagnosis not present

## 2019-02-12 DIAGNOSIS — K219 Gastro-esophageal reflux disease without esophagitis: Secondary | ICD-10-CM | POA: Diagnosis not present

## 2019-02-12 DIAGNOSIS — I10 Essential (primary) hypertension: Secondary | ICD-10-CM | POA: Diagnosis not present

## 2019-02-12 DIAGNOSIS — I2510 Atherosclerotic heart disease of native coronary artery without angina pectoris: Secondary | ICD-10-CM | POA: Diagnosis not present

## 2019-02-14 ENCOUNTER — Telehealth: Payer: Self-pay

## 2019-02-14 ENCOUNTER — Telehealth: Payer: Self-pay | Admitting: *Deleted

## 2019-02-14 MED ORDER — NITROFURANTOIN MONOHYD MACRO 100 MG PO CAPS: 100.0000 mg | ORAL_CAPSULE | Freq: Every day | ORAL | 5 refills | Status: DC

## 2019-02-14 NOTE — Telephone Encounter (Signed)
Called patient to let him know that we just received his cardiac clearance and that I went ahead and faxed it to the specialty scheduling and now we are waiting for the radiologist to let us know when it could be scheduled since he had to stop his Plavix 7 days prior to his procedure. Patient was also told to stop Plavix tomorrow since he had taken it today and that once I received a call from specialty scheduling, then I would call him back. Patient understood and had no further questions.

## 2019-02-14 NOTE — Telephone Encounter (Signed)
Pt advised.  RX sent to [facility] [address]   Thanks,   Khaliya Golinski  

## 2019-02-14 NOTE — Telephone Encounter (Signed)
Thank you :)

## 2019-02-14 NOTE — Telephone Encounter (Signed)
-----   Message from [name], MD sent at 02/14/2019  2:59 PM EDT ----- Culture shows infection sensitive to nitrofurantoin, recommend he stay on nitrofurantoin 100mg  once a day at bedtime to prevent more infections. Can send in prescription for #30, rf x 5

## 2019-02-14 NOTE — Telephone Encounter (Signed)
Patient called asking when his procedure is going to be. Please return his call [phone]

## 2019-02-15 ENCOUNTER — Other Ambulatory Visit: Payer: Self-pay | Admitting: *Deleted

## 2019-02-15 DIAGNOSIS — Z8579 Personal history of other malignant neoplasms of lymphoid, hematopoietic and related tissues: Secondary | ICD-10-CM

## 2019-02-19 ENCOUNTER — Ambulatory Visit: Payer: PPO | Admitting: Family Medicine

## 2019-02-20 ENCOUNTER — Other Ambulatory Visit: Payer: Self-pay | Admitting: Student

## 2019-02-21 ENCOUNTER — Other Ambulatory Visit: Payer: Self-pay

## 2019-02-21 ENCOUNTER — Ambulatory Visit
Admission: RE | Admit: 2019-02-21 | Discharge: 2019-02-21 | Disposition: A | Discharge status: Home / Self Care | Payer: PPO | Source: Ambulatory Visit | Attending: Oncology | Admitting: Oncology

## 2019-02-21 DIAGNOSIS — Z8579 Personal history of other malignant neoplasms of lymphoid, hematopoietic and related tissues: Secondary | ICD-10-CM | POA: Diagnosis not present

## 2019-02-21 DIAGNOSIS — R1909 Other intra-abdominal and pelvic swelling, mass and lump: Secondary | ICD-10-CM | POA: Diagnosis not present

## 2019-02-21 DIAGNOSIS — Z87891 Personal history of nicotine dependence: Secondary | ICD-10-CM | POA: Insufficient documentation

## 2019-02-21 DIAGNOSIS — K219 Gastro-esophageal reflux disease without esophagitis: Secondary | ICD-10-CM | POA: Diagnosis not present

## 2019-02-21 DIAGNOSIS — I519 Heart disease, unspecified: Secondary | ICD-10-CM | POA: Diagnosis not present

## 2019-02-21 DIAGNOSIS — I2510 Atherosclerotic heart disease of native coronary artery without angina pectoris: Secondary | ICD-10-CM | POA: Diagnosis not present

## 2019-02-21 DIAGNOSIS — Z79899 Other long term (current) drug therapy: Secondary | ICD-10-CM | POA: Insufficient documentation

## 2019-02-21 DIAGNOSIS — C679 Malignant neoplasm of bladder, unspecified: Secondary | ICD-10-CM | POA: Diagnosis not present

## 2019-02-21 DIAGNOSIS — R1900 Intra-abdominal and pelvic swelling, mass and lump, unspecified site: Secondary | ICD-10-CM | POA: Diagnosis not present

## 2019-02-21 DIAGNOSIS — R590 Localized enlarged lymph nodes: Secondary | ICD-10-CM | POA: Diagnosis not present

## 2019-02-21 LAB — CBC
HCT: 35.7 % — ABNORMAL LOW (ref 39.0–52.0)
Hemoglobin: 11.6 g/dL — ABNORMAL LOW (ref 13.0–17.0)
MCH: 28.4 pg (ref 26.0–34.0)
MCHC: 32.5 g/dL (ref 30.0–36.0)
MCV: 87.5 fL (ref 80.0–100.0)
Platelets: 277 10*3/uL (ref 150–400)
RBC: 4.08 MIL/uL — ABNORMAL LOW (ref 4.22–5.81)
RDW: 15.2 % (ref 11.5–15.5)
WBC: 7.4 10*3/uL (ref 4.0–10.5)
nRBC: 0 % (ref 0.0–0.2)

## 2019-02-21 LAB — PROTIME-INR
INR: 0.9 (ref 0.8–1.2)
Prothrombin Time: 12.5 seconds (ref 11.4–15.2)

## 2019-02-21 MED ORDER — FENTANYL CITRATE (PF) 100 MCG/2ML IJ SOLN
INTRAMUSCULAR | Status: AC | PRN
  Administered 2019-02-21: 50 ug via INTRAVENOUS

## 2019-02-21 MED ORDER — FENTANYL CITRATE (PF) 100 MCG/2ML IJ SOLN
INTRAMUSCULAR | Status: AC
  Filled 2019-02-21: qty 4

## 2019-02-21 MED ORDER — MIDAZOLAM HCL 5 MG/5ML IJ SOLN
INTRAMUSCULAR | Status: AC
  Filled 2019-02-21: qty 5

## 2019-02-21 MED ORDER — SODIUM CHLORIDE 0.9 % IV SOLN
INTRAVENOUS | Status: DC
  Administered 2019-02-21: 13:00:00 via INTRAVENOUS

## 2019-02-21 MED ORDER — MIDAZOLAM HCL 5 MG/5ML IJ SOLN
INTRAMUSCULAR | Status: AC | PRN
  Administered 2019-02-21 (×2): 1 mg via INTRAVENOUS

## 2019-02-21 NOTE — Procedures (Signed)
Left pelvic sidewall mass  S/p CT BX  No comp Stable ebl min Path pending Full report in pacs

## 2019-02-21 NOTE — H&P (Signed)
Chief Complaint:   enalrging left pelvic mass  Referring Physician(s): Finnegan,Timothy J    History of Present Illness: Bryan Nelson is a 85 y.o. male with h/o lymphoma and recently treated diverticulitis.  Now with enlarging left pelvic sidewall Mass, remains indeterminate by CT.  No complaints today.  ROS neg.  Past Medical History:  Diagnosis Date   Arteriosclerosis of coronary artery 08/26/2011   Overview:      a.  1999 PCI of the mid LAD with stent.      b.  2002 [facility] [place]: EF 56%. RCA-distal 25/25%. Left main-50% ostial.  Left circumflex-25% OM2.  LAD-25% proximal.  75% mid.  25/25% distal. 75% D1.      c.  07/2011 PCI of RCA with DES.[place]    Bleeding gastrointestinal 08/26/2011   Overview:      a.  Chronic abdominal pain, present improving.      b.  Duodenitis and gastritis by EGD in 2000.      c.  Pylori in 2000, treated.      d.  Gastroesophageal reflux disease.      e.  Diverticular disease.     BP (high blood pressure) 08/26/2011   Closed dislocation of right ankle 09/10/2016   Successfully reduced in ER 09/10/2016   Diffuse large B-cell lymphoma ([place]) 2015   chemo tx's   GERD (gastroesophageal reflux disease)    Heart disease    Helicobacter pylori infection 06/18/2015   by EGD RX 08/18/1999    History of adenomatous polyp of colon 06/18/2015   HOH (hard of hearing)    Bilateral Hearing  Aids   Malleolar fracture (Right) 09/10/2016    Past Surgical History:  Procedure Laterality Date   ANGIOPLASTY     CATARACT EXTRACTION     COLONOSCOPY  2016   CORONARY ANGIOPLASTY     EYE SURGERY Bilateral    Cataract Extraction with IOL   GREEN LIGHT LASER TURP (TRANSURETHRAL RESECTION OF PROSTATE N/A 04/16/2015   Procedure: GREEN LIGHT LASER TURP (TRANSURETHRAL RESECTION OF PROSTATE WITH BLADDER BIOPSY;  Surgeon: Collier Flowers, MD;  Location: [facility] ORS;  Service: Urology;  Laterality: N/A;   heart stent placement     1998, 2000, 2012    TRANSURETHRAL RESECTION OF PROSTATE N/A 03/02/2017   Procedure: TRANSURETHRAL RESECTION OF THE PROSTATE (TURP);  Surgeon: Hollice Espy, MD;  Location: [facility] ORS;  Service: Urology;  Laterality: N/A;    Allergies: Ciprofloxacin, Lisinopril, Penicillins, Bactrim [sulfamethoxazole-trimethoprim], and Sulfa antibiotics  Medications: Prior to Admission medications   Medication Sig Start Date End Date Taking? Authorizing Provider  atorvastatin (LIPITOR) 10 MG tablet TAKE ONE TABLET BY MOUTH ONCE DAILY 02/19/18  Yes [name], MD  benazepril-hydrochlorthiazide (LOTENSIN HCT) 5-6.25 MG tablet Take 1 tablet by mouth once daily 10/19/18  Yes Fisher, Kirstie Peri, MD  Cholecalciferol (VITAMIN D3) 1000 units CAPS Take 1,000 Units by mouth daily.   Yes [provider]  famotidine (PEPCID) 20 MG tablet Take 1 tablet (20 mg total) by mouth 2 (two) times daily. 07/26/18  Yes [name], MD  finasteride (PROSCAR) 5 MG tablet Take 1 tablet (5 mg total) by mouth daily. 11/01/18  Yes McGowan, Larene Beach A, PA-C  gabapentin (NEURONTIN) 300 MG capsule Take 1 capsule (300 mg total) by mouth at bedtime. Do not take if you take temazepam 01/30/19  Yes Fisher, Kirstie Peri, MD  isosorbide dinitrate (ISORDIL) 30 MG tablet Take 1 tablet (30 mg total) by mouth every morning.  08/26/16  Yes [name], MD  Magnesium 250 MG TABS Take 250 mg by mouth daily.   Yes [provider]  metoprolol succinate (TOPROL-XL) 25 MG 24 hr tablet Take 1 tablet (25 mg total) by mouth daily. 10/19/18  Yes [name], MD  Multiple Vitamins-Minerals (MULTIVITAMIN ADULT PO) Take by mouth daily.   Yes [provider]  Multiple Vitamins-Minerals (VISION FORMULA 2 PO) Take by mouth daily.   Yes [provider]  nitrofurantoin, macrocrystal-monohydrate, (MACROBID) 100 MG capsule Take 1 capsule (100 mg total) by mouth at bedtime. 02/14/19  Yes [name], MD  nitroGLYCERIN (NITROSTAT) 0.4 MG SL tablet Place  1 tablet (0.4 mg total) under the tongue every 5 (five) minutes as needed for chest pain. Reported on 03/10/2016 11/09/18  Yes [name], MD  psyllium (METAMUCIL) 58.6 % packet Take 1 packet by mouth daily.   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   Food insecurity    Worry: Never true    Inability: Never true   Transportation needs    Medical: No    Non-medical: No  Tobacco Use   Smoking status: Former Smoker    Packs/day: 1.50    Years: 12.00    Pack years: 18.00    Types: Cigarettes    Quit date: 08/24/1971    Years since quitting: 47.5   Smokeless tobacco: Never Used  Substance and Sexual Activity   Alcohol use: No    Alcohol/week: 0.0 standard drinks   Drug use: No   Sexual activity: Not on file  Lifestyle   Physical activity    Days per week: 0 days    Minutes per session: 0 min   Stress: Not at all  Relationships   Social connections    Talks on phone: Patient refused    Gets together: Patient refused    Attends religious service: Patient refused    Active member of club or organization: Patient refused    Attends meetings of clubs or organizations: Patient refused    Relationship status: Patient refused  Other Topics Concern   Not on file  Social History Narrative   Not on file    ECOG Status: 1 - Symptomatic but completely  ambulatory  Review of Systems: A 12 point ROS discussed and pertinent positives are indicated in the HPI above.  All other systems are negative.  Review of Systems  Vital Signs: BP 136/72    Pulse 80    Temp 98.4 F (36.9 C) (Oral)    Resp 18    Ht 6\' 1"  (1.854 m)    Wt 113.4 kg    SpO2 96%    BMI 32.98 kg/m   Physical Exam Constitutional:  General: He is not in acute distress.    Appearance: He is not toxic-appearing.  HENT:     Mouth/Throat:     Mouth: Mucous membranes are moist.     Pharynx: Oropharynx is clear.  Eyes:     General: No scleral icterus.    Conjunctiva/sclera: Conjunctivae normal.  Cardiovascular:     Rate and Rhythm: Normal rate and regular rhythm.     Heart sounds: Normal heart sounds. No murmur.  Pulmonary:     Effort: Pulmonary effort is normal.     Breath sounds: Normal breath sounds.  Abdominal:     General: Bowel sounds are normal.     Palpations: Abdomen is soft.  Skin:    General: Skin is warm and dry.  Neurological:     General: No focal deficit present.     Mental Status: Mental status is at baseline.  Psychiatric:        Mood and Affect: Mood normal.        Thought Content: Thought content normal.     Imaging: Ct Abdomen Pelvis Wo Contrast  Result Date: 02/01/2019 CLINICAL DATA:  Diffuse B-cell lymphoma. EXAM: CT ABDOMEN AND PELVIS WITHOUT CONTRAST TECHNIQUE: Multidetector CT imaging of the abdomen and pelvis was performed following the standard protocol without IV contrast. COMPARISON:  10/18/2018 FINDINGS: Lower chest: Coronary artery calcification is evident. Atherosclerotic calcification is noted in the wall of the thoracic aorta. Hepatobiliary: No suspicious focal abnormality within the liver parenchyma. There is no evidence for gallstones, gallbladder wall thickening, or pericholecystic fluid. No intrahepatic or extrahepatic biliary dilation. Pancreas: No focal mass lesion. No dilatation of the main duct. No intraparenchymal cyst. No  peripancreatic edema. Spleen: No splenomegaly. No focal mass lesion. Adrenals/Urinary Tract: No adrenal nodule or mass. 4.8 cm low-density lesion upper pole right kidney is similar to prior and likely a cyst. Similar low-density lesions are seen in the interpolar right kidney and upper pole left kidney, also likely a cyst. Right ureter unremarkable. There is mild left hydroureteronephrosis. 5.0 x 4.9 cm irregular soft tissue lesion involves the distal left ureter with apparent posterior left bladder diverticulum distal to this pelvic sidewall lesion. Stomach/Bowel: Stomach is unremarkable. No gastric wall thickening. No evidence of outlet obstruction. Duodenum is normally positioned as is the ligament of Treitz. Insert duodenal diverticulum. No small bowel wall thickening. No small bowel dilatation. The terminal ileum is normal. The appendix is not visualized, but there is no edema or inflammation in the region of the cecum. Diverticular changes are noted in the left colon without evidence of diverticulitis. Vascular/Lymphatic: There is abdominal aortic atherosclerosis without aneurysm. There is no gastrohepatic or hepatoduodenal ligament lymphadenopathy. No intraperitoneal or retroperitoneal lymphadenopathy. 13 mm short axis left external iliac lymph node with 16 mm short axis previously. Reproductive: The prostate gland and seminal vesicles are unremarkable. Other: No intraperitoneal free fluid. Musculoskeletal: Small left groin hernia contains only fat. No worrisome lytic or sclerotic osseous abnormality. IMPRESSION: 1. Irregular 4.9 x 5.0 cm soft tissue lesion along the left pelvic sidewall incorporates and obstructs the left ureter. This also appears to involve a posterior left bladder diverticulum and sigmoid colon. 2. Slight decrease in left external iliac lymphadenopathy. 3.  Aortic Atherosclerois (ICD10-170.0) Electronically Signed   By: Misty Stanley M.D.   On: 02/01/2019 13:33    Labs:  CBC: Recent  Labs    08/10/18 0821 02/21/19 1233  WBC 4.8 7.4  HGB 13.1 11.6*  HCT 38.9 35.7*  PLT 240 277  COAGS: Recent Labs    02/21/19 1233  INR 0.9    BMP: Recent Labs    08/10/18 0821 10/18/18 1041 11/09/18 0908 02/01/19 1108  NA 139  --  140  --   K 4.1  --  4.2  --   CL 102  --  101  --   CO2 22  --  24  --   GLUCOSE 93  --  82  --   BUN 18  --  19  --   CALCIUM 9.1  --  9.0  --   CREATININE 1.11 1.20 1.09 2.00*  GFRNONAA 61  --  62  --   GFRAA 70  --  72  --     LIVER FUNCTION TESTS: Recent Labs    08/10/18 0821 11/09/18 0908  BILITOT 0.4 0.3  AST 17 22  ALT 21 32  ALKPHOS 67 88  PROT 6.2 6.5  ALBUMIN 4.1 4.3    TUMOR MARKERS: No results for input(s): AFPTM, CEA, CA199, CHROMGRNA in the last 8760 hours.  Assessment and Plan:  Enlarging left pelvic sidewall mass / abnormality.  Plan for CT bx today.  Consent obtained  Risks and benefits of CT BX was discussed with the patient and/or patient's family including, but not limited to bleeding, infection, damage to adjacent structures or low yield requiring additional tests.  All of the questions were answered and there is agreement to proceed.  Consent signed and in chart.    Thank you for this interesting consult.  I greatly enjoyed meeting Bryan Nelson and look forward to participating in their care.  A copy of this report was sent to the requesting provider on this date.  Electronically Signed: Greggory Keen, MD 02/21/2019, 1:23 PM   I spent a total of  15 Minutes   in face to face in clinical consultation, greater than 50% of which was counseling/coordinating care for this patient with a left pelvic mass.

## 2019-02-26 ENCOUNTER — Other Ambulatory Visit: Payer: Self-pay | Admitting: Family Medicine

## 2019-02-26 MED ORDER — CLOPIDOGREL BISULFATE 75 MG PO TABS: 75.0000 mg | ORAL_TABLET | Freq: Every day | ORAL | 4 refills | Status: DC

## 2019-02-26 NOTE — Telephone Encounter (Signed)
Patient needs refills on Sucralfate 1 gm and Clopidogrel 75 mg.  Send to  [facility] on [address].

## 2019-02-27 ENCOUNTER — Other Ambulatory Visit: Payer: Self-pay | Admitting: Anatomic Pathology & Clinical Pathology

## 2019-02-27 LAB — SURGICAL PATHOLOGY

## 2019-02-28 ENCOUNTER — Other Ambulatory Visit: Payer: Self-pay

## 2019-03-01 ENCOUNTER — Inpatient Hospital Stay (HOSPITAL_BASED_OUTPATIENT_CLINIC_OR_DEPARTMENT_OTHER): Payer: PPO | Admitting: Hospice and Palliative Medicine

## 2019-03-01 ENCOUNTER — Other Ambulatory Visit: Payer: Self-pay

## 2019-03-01 ENCOUNTER — Encounter: Payer: Self-pay | Admitting: Oncology

## 2019-03-01 ENCOUNTER — Emergency Department
Admission: EM | Admit: 2019-03-01 | Discharge: 2019-03-01 | Disposition: A | Discharge status: Home / Self Care | Payer: PPO | Attending: Emergency Medicine | Admitting: Emergency Medicine

## 2019-03-01 ENCOUNTER — Inpatient Hospital Stay: Payer: PPO | Attending: Oncology | Admitting: Oncology

## 2019-03-01 ENCOUNTER — Other Ambulatory Visit: Payer: Self-pay | Admitting: Family Medicine

## 2019-03-01 ENCOUNTER — Emergency Department: Payer: PPO

## 2019-03-01 ENCOUNTER — Encounter: Payer: Self-pay | Admitting: *Deleted

## 2019-03-01 DIAGNOSIS — Z20828 Contact with and (suspected) exposure to other viral communicable diseases: Secondary | ICD-10-CM | POA: Diagnosis not present

## 2019-03-01 DIAGNOSIS — C679 Malignant neoplasm of bladder, unspecified: Secondary | ICD-10-CM

## 2019-03-01 DIAGNOSIS — C8330 Diffuse large B-cell lymphoma, unspecified site: Secondary | ICD-10-CM

## 2019-03-01 DIAGNOSIS — Z79899 Other long term (current) drug therapy: Secondary | ICD-10-CM | POA: Insufficient documentation

## 2019-03-01 DIAGNOSIS — Z515 Encounter for palliative care: Secondary | ICD-10-CM | POA: Insufficient documentation

## 2019-03-01 DIAGNOSIS — Z87891 Personal history of nicotine dependence: Secondary | ICD-10-CM | POA: Insufficient documentation

## 2019-03-01 DIAGNOSIS — I10 Essential (primary) hypertension: Secondary | ICD-10-CM | POA: Insufficient documentation

## 2019-03-01 DIAGNOSIS — Z7902 Long term (current) use of antithrombotics/antiplatelets: Secondary | ICD-10-CM | POA: Insufficient documentation

## 2019-03-01 DIAGNOSIS — N390 Urinary tract infection, site not specified: Secondary | ICD-10-CM | POA: Diagnosis not present

## 2019-03-01 DIAGNOSIS — N323 Diverticulum of bladder: Secondary | ICD-10-CM | POA: Insufficient documentation

## 2019-03-01 DIAGNOSIS — K5790 Diverticulosis of intestine, part unspecified, without perforation or abscess without bleeding: Secondary | ICD-10-CM | POA: Diagnosis not present

## 2019-03-01 DIAGNOSIS — Z03818 Encounter for observation for suspected exposure to other biological agents ruled out: Secondary | ICD-10-CM | POA: Diagnosis not present

## 2019-03-01 DIAGNOSIS — C689 Malignant neoplasm of urinary organ, unspecified: Secondary | ICD-10-CM | POA: Diagnosis not present

## 2019-03-01 DIAGNOSIS — Z7189 Other specified counseling: Secondary | ICD-10-CM

## 2019-03-01 DIAGNOSIS — I700 Atherosclerosis of aorta: Secondary | ICD-10-CM | POA: Diagnosis not present

## 2019-03-01 DIAGNOSIS — N289 Disorder of kidney and ureter, unspecified: Secondary | ICD-10-CM | POA: Diagnosis not present

## 2019-03-01 DIAGNOSIS — N1330 Unspecified hydronephrosis: Secondary | ICD-10-CM | POA: Insufficient documentation

## 2019-03-01 DIAGNOSIS — R1032 Left lower quadrant pain: Secondary | ICD-10-CM | POA: Diagnosis not present

## 2019-03-01 DIAGNOSIS — R109 Unspecified abdominal pain: Secondary | ICD-10-CM | POA: Insufficient documentation

## 2019-03-01 DIAGNOSIS — I259 Chronic ischemic heart disease, unspecified: Secondary | ICD-10-CM | POA: Insufficient documentation

## 2019-03-01 DIAGNOSIS — I2510 Atherosclerotic heart disease of native coronary artery without angina pectoris: Secondary | ICD-10-CM | POA: Diagnosis not present

## 2019-03-01 DIAGNOSIS — R390 Extravasation of urine: Secondary | ICD-10-CM

## 2019-03-01 LAB — CBC
HCT: 35.5 % — ABNORMAL LOW (ref 39.0–52.0)
Hemoglobin: 11.6 g/dL — ABNORMAL LOW (ref 13.0–17.0)
MCH: 28.3 pg (ref 26.0–34.0)
MCHC: 32.7 g/dL (ref 30.0–36.0)
MCV: 86.6 fL (ref 80.0–100.0)
Platelets: 288 10*3/uL (ref 150–400)
RBC: 4.1 MIL/uL — ABNORMAL LOW (ref 4.22–5.81)
RDW: 14.7 % (ref 11.5–15.5)
WBC: 7.9 10*3/uL (ref 4.0–10.5)
nRBC: 0 % (ref 0.0–0.2)

## 2019-03-01 LAB — URINALYSIS, COMPLETE (UACMP) WITH MICROSCOPIC
Bilirubin Urine: NEGATIVE
Glucose, UA: NEGATIVE mg/dL
Ketones, ur: NEGATIVE mg/dL
Nitrite: POSITIVE — AB
Protein, ur: 30 mg/dL — AB
Specific Gravity, Urine: 1.016 (ref 1.005–1.030)
Squamous Epithelial / HPF: NONE SEEN (ref 0–5)
pH: 6 (ref 5.0–8.0)

## 2019-03-01 LAB — COMPREHENSIVE METABOLIC PANEL
ALT: 15 U/L (ref 0–44)
AST: 17 U/L (ref 15–41)
Albumin: 4.2 g/dL (ref 3.5–5.0)
Alkaline Phosphatase: 71 U/L (ref 38–126)
Anion gap: 12 (ref 5–15)
BUN: 31 mg/dL — ABNORMAL HIGH (ref 8–23)
CO2: 26 mmol/L (ref 22–32)
Calcium: 9.1 mg/dL (ref 8.9–10.3)
Chloride: 100 mmol/L (ref 98–111)
Creatinine, Ser: 2.08 mg/dL — ABNORMAL HIGH (ref 0.61–1.24)
GFR calc Af Amer: 33 mL/min — ABNORMAL LOW (ref >60)
GFR calc non Af Amer: 28 mL/min — ABNORMAL LOW (ref >60)
Glucose, Bld: 119 mg/dL — ABNORMAL HIGH (ref 70–99)
Potassium: 4.4 mmol/L (ref 3.5–5.1)
Sodium: 138 mmol/L (ref 135–145)
Total Bilirubin: 0.7 mg/dL (ref 0.3–1.2)
Total Protein: 7.7 g/dL (ref 6.5–8.1)

## 2019-03-01 LAB — LIPASE, BLOOD: Lipase: 35 U/L (ref 11–51)

## 2019-03-01 LAB — SARS CORONAVIRUS 2 BY RT PCR (HOSPITAL ORDER, PERFORMED IN ~~LOC~~ HOSPITAL LAB): SARS Coronavirus 2: NEGATIVE

## 2019-03-01 MED ORDER — SODIUM CHLORIDE 0.9% FLUSH: 3.0000 mL | Freq: Once | INTRAVENOUS | Status: DC

## 2019-03-01 MED ORDER — ONDANSETRON HCL 4 MG/2ML IJ SOLN
4.0000 mg | Freq: Once | INTRAMUSCULAR | Status: AC
  Administered 2019-03-01: 4 mg via INTRAVENOUS
  Filled 2019-03-01: qty 2

## 2019-03-01 MED ORDER — OXYCODONE-ACETAMINOPHEN 5-325 MG PO TABS: 1.0000 | ORAL_TABLET | Freq: Three times a day (TID) | ORAL | 0 refills | Status: DC | PRN

## 2019-03-01 MED ORDER — OXYCODONE-ACETAMINOPHEN 5-325 MG PO TABS
1.0000 | ORAL_TABLET | Freq: Once | ORAL | Status: AC
  Administered 2019-03-01: 1 via ORAL
  Filled 2019-03-01: qty 1

## 2019-03-01 MED ORDER — FENTANYL CITRATE (PF) 100 MCG/2ML IJ SOLN
50.0000 ug | Freq: Once | INTRAMUSCULAR | Status: AC
  Administered 2019-03-01: 50 ug via INTRAVENOUS
  Filled 2019-03-01: qty 2

## 2019-03-01 MED ORDER — MORPHINE SULFATE (PF) 4 MG/ML IV SOLN
4.0000 mg | Freq: Once | INTRAVENOUS | Status: AC
  Administered 2019-03-01: 4 mg via INTRAVENOUS
  Filled 2019-03-01: qty 1

## 2019-03-01 MED ORDER — SODIUM CHLORIDE 0.9 % IV SOLN
1.0000 g | Freq: Once | INTRAVENOUS | Status: AC
  Administered 2019-03-01: 08:00:00 1 g via INTRAVENOUS
  Filled 2019-03-01: qty 10

## 2019-03-01 MED ORDER — IOHEXOL 240 MG/ML SOLN
25.0000 mL | INTRAMUSCULAR | Status: AC
  Administered 2019-03-01: 25 mL via ORAL

## 2019-03-01 MED ORDER — SODIUM CHLORIDE 0.9 % IV BOLUS
1000.0000 mL | Freq: Once | INTRAVENOUS | Status: AC
  Administered 2019-03-01: 1000 mL via INTRAVENOUS

## 2019-03-01 MED ORDER — NITROFURANTOIN MONOHYD MACRO 100 MG PO CAPS: 100.0000 mg | ORAL_CAPSULE | Freq: Two times a day (BID) | ORAL | 0 refills | Status: DC

## 2019-03-01 NOTE — ED Notes (Signed)
Patient transported to CT 

## 2019-03-01 NOTE — ED Triage Notes (Signed)
Pt presents w/ c/o LUQ abdominal pain. Pt states constipation x 2 days. Pt states pain worsening over the course of today. Pt denies n/v/d, fever, chest pain and dyspnea. Pt ambulatory and A&O x4.

## 2019-03-01 NOTE — ED Provider Notes (Signed)
[facility] Emergency Department Provider Note   ____________________________________________   First MD Initiated Contact with Patient 03/01/19 [phone]     (approximate)  I have reviewed the triage vital signs and the nursing notes.   HISTORY  Chief Complaint Abdominal Pain    HPI Bryan Nelson is a 85 y.o. male who presents to the ED from home with a chief complaint of left lower quadrant abdominal pain.  Patient has a history of lymphoma and diverticular abscess requiring drainage.  Has been treated with antibiotics over the past several months for presumed diverticulitis.  Had a biopsy done of his left pelvic sidewall on July 1 and has an appointment to see his oncologist this morning.  Complains of left lower quadrant abdominal pain x1 day.  Symptoms associated with constipation which is usual for patient.  Denies fever, cough, chest pain, shortness of breath, nausea, vomiting, dysuria.  Denies recent travel, trauma or exposure to persons diagnosed with coronavirus.       Past Medical History:  Diagnosis Date  . Arteriosclerosis of coronary artery 08/26/2011   Overview:      a.  1999 PCI of the mid LAD with stent.      b.  2002 Cath [employer]: EF 56%. RCA-distal 25/25%. Left main-50% ostial.  Left circumflex-25% OM2.  LAD-25% proximal.  75% mid.  25/25% distal. 75% D1.      c.  07/2011 PCI of RCA with DES.[place]   . Bleeding gastrointestinal 08/26/2011   Overview:      a.  Chronic abdominal pain, present improving.      b.  Duodenitis and gastritis by EGD in 2000.      c.  Pylori in 2000, treated.      d.  Gastroesophageal reflux disease.      e.  Diverticular disease.    . BP (high blood pressure) 08/26/2011  . Closed dislocation of right ankle 09/10/2016   Successfully reduced in ER 09/10/2016  . Diffuse large B-cell lymphoma ([place]) 2015   chemo tx's  . GERD (gastroesophageal reflux disease)   . Heart disease   . Helicobacter pylori infection 06/18/2015   by  EGD RX 08/18/1999   . History of adenomatous polyp of colon 06/18/2015  . HOH (hard of hearing)    Bilateral Hearing  Aids  . Malleolar fracture (Right) 09/10/2016    Patient Active Problem List   Diagnosis Date Noted  . BPH (benign prostatic hyperplasia) 03/02/2017  . Recurrent UTI 12/21/2015  . Urinary retention 09/24/2015  . Narrowing of intervertebral disc space 06/18/2015  . Diverticulitis 06/18/2015  . Esophageal reflux 06/18/2015  . Familial multiple lipoprotein-type hyperlipidemia 06/18/2015  . Insomnia 06/18/2015  . Vitamin D deficiency 06/18/2015  . DLBCL (diffuse large B cell lymphoma) ([place]) 04/10/2014  . ED (erectile dysfunction) of organic origin 08/03/2012  . Incomplete bladder emptying 08/03/2012  . Breath shortness 12/09/2011  . Arteriosclerosis of coronary artery 08/26/2011  . Bleeding gastrointestinal 08/26/2011  . HLD (hyperlipidemia) 08/26/2011  . Essential hypertension 08/26/2011  . Peripheral nerve disease 08/26/2011    Past Surgical History:  Procedure Laterality Date  . ANGIOPLASTY    . CATARACT EXTRACTION    . COLONOSCOPY  2016  . CORONARY ANGIOPLASTY    . EYE SURGERY Bilateral    Cataract Extraction with IOL  . GREEN LIGHT LASER TURP (TRANSURETHRAL RESECTION OF PROSTATE N/A 04/16/2015   Procedure: GREEN LIGHT LASER TURP (TRANSURETHRAL RESECTION OF PROSTATE WITH BLADDER BIOPSY;  Surgeon: Delfino Lovett  Cori Razor, MD;  Location: [facility] ORS;  Service: Urology;  Laterality: N/A;  . heart stent placement     1998, 2000, 2012  . TRANSURETHRAL RESECTION OF PROSTATE N/A 03/02/2017   Procedure: TRANSURETHRAL RESECTION OF THE PROSTATE (TURP);  Surgeon: Hollice Espy, MD;  Location: [facility] ORS;  Service: Urology;  Laterality: N/A;    Prior to Admission medications   Medication Sig Start Date End Date Taking? Authorizing Provider  atorvastatin (LIPITOR) 10 MG tablet TAKE ONE TABLET BY MOUTH ONCE DAILY 02/19/18   [name], MD  benazepril-hydrochlorthiazide  (LOTENSIN HCT) 5-6.25 MG tablet Take 1 tablet by mouth once daily 10/19/18   [name], MD  Cholecalciferol (VITAMIN D3) 1000 units CAPS Take 1,000 Units by mouth daily.    [provider]  clopidogrel (PLAVIX) 75 MG tablet Take 1 tablet (75 mg total) by mouth daily. 02/26/19   [name], MD  famotidine (PEPCID) 20 MG tablet Take 1 tablet (20 mg total) by mouth 2 (two) times daily. 07/26/18   [name], MD  finasteride (PROSCAR) 5 MG tablet Take 1 tablet (5 mg total) by mouth daily. 11/01/18   Zara Council A, PA-C  gabapentin (NEURONTIN) 300 MG capsule Take 1 capsule (300 mg total) by mouth at bedtime. Do not take if you take temazepam 01/30/19   [name], MD  isosorbide dinitrate (ISORDIL) 30 MG tablet Take 1 tablet (30 mg total) by mouth every morning. 08/26/16   [name], MD  Magnesium 250 MG TABS Take 250 mg by mouth daily.    [provider]  metoprolol succinate (TOPROL-XL) 25 MG 24 hr tablet Take 1 tablet (25 mg total) by mouth daily. 10/19/18   [name], MD  Multiple Vitamins-Minerals (MULTIVITAMIN ADULT PO) Take by mouth daily.    [provider]  Multiple Vitamins-Minerals (VISION FORMULA 2 PO) Take by mouth daily.    [provider]  nitrofurantoin, macrocrystal-monohydrate, (MACROBID) 100 MG capsule Take 1 capsule (100 mg total) by mouth at bedtime. 02/14/19   [name], MD  nitroGLYCERIN (NITROSTAT) 0.4 MG SL tablet Place 1 tablet (0.4 mg total) under the tongue every 5 (five) minutes as needed for chest pain. Reported on 03/10/2016 11/09/18   [name], MD  psyllium (METAMUCIL) 58.6 % packet Take 1 packet by mouth daily.    [provider]    Allergies Ciprofloxacin, Lisinopril, Penicillins, Bactrim [sulfamethoxazole-trimethoprim], and Sulfa antibiotics  Family History  Problem Relation Age of Onset  . Osteoporosis Mother   . Alzheimer's disease Father   . Kidney disease Neg Hx   .  Prostate cancer Neg Hx   . Bladder Cancer Neg Hx   . Kidney cancer Neg Hx     Social History Social History   Tobacco Use  . Smoking status: Former Smoker    Packs/day: 1.50    Years: 12.00    Pack years: 18.00    Types: Cigarettes    Quit date: 08/24/1971    Years since quitting: 47.5  . Smokeless tobacco: Never Used  Substance Use Topics  . Alcohol use: No    Alcohol/week: 0.0 standard drinks  . Drug use: No    Review of Systems  Constitutional: No fever/chills Eyes: No visual changes. ENT: No sore throat. Cardiovascular: Denies chest pain. Respiratory: Denies shortness of breath. Gastrointestinal: Positive for left lower quadrant abdominal pain.  No nausea, no vomiting.  No diarrhea.  Positive for constipation. Genitourinary: Negative for  dysuria. Musculoskeletal: Negative for back pain. Skin: Negative for rash. Neurological: Negative for headaches, focal weakness or numbness.   ____________________________________________   PHYSICAL EXAM:  VITAL SIGNS: ED Triage Vitals  Enc Vitals Group     BP 03/01/19 0125 (!) 190/95     Pulse Rate 03/01/19 0125 85     Resp 03/01/19 0125 16     Temp 03/01/19 0125 98.8 F (37.1 C)     Temp Source 03/01/19 0125 Oral     SpO2 03/01/19 0125 96 %     Weight 03/01/19 0126 250 lb (113.4 kg)     Height 03/01/19 0126 6\' 1"  (1.854 m)     Head Circumference --      Peak Flow --      Pain Score 03/01/19 0143 5     Pain Loc --      Pain Edu? --      Excl. in [place]? --     Constitutional: Alert and oriented. Well appearing and in no acute distress. Eyes: Conjunctivae are normal. PERRL. EOMI. Head: Atraumatic. Nose: No congestion/rhinnorhea. Mouth/Throat: Mucous membranes are moist.  Oropharynx non-erythematous. Neck: No stridor.   Cardiovascular: Normal rate, regular rhythm. Grossly normal heart sounds.  Good peripheral circulation. Respiratory: Normal respiratory effort.  No retractions. Lungs CTAB. Gastrointestinal: Soft and  mildly tender to palpation left lower quadrant without rebound or guarding. No distention. No abdominal bruits. No CVA tenderness. Musculoskeletal: No lower extremity tenderness nor edema.  No joint effusions. Neurologic:  Normal speech and language. No gross focal neurologic deficits are appreciated. No gait instability. Skin:  Skin is warm, dry and intact. No rash noted. Psychiatric: Mood and affect are normal. Speech and behavior are normal.  ____________________________________________   LABS (all labs ordered are listed, but only abnormal results are displayed)  Labs Reviewed  COMPREHENSIVE METABOLIC PANEL - Abnormal; Notable for the following components:      Result Value   Glucose, Bld 119 (*)    BUN 31 (*)    Creatinine, Ser 2.08 (*)    GFR calc non Af Amer 28 (*)    GFR calc Af Amer 33 (*)    All other components within normal limits  CBC - Abnormal; Notable for the following components:   RBC 4.10 (*)    Hemoglobin 11.6 (*)    HCT 35.5 (*)    All other components within normal limits  SARS CORONAVIRUS 2 (HOSPITAL ORDER, [facility])  LIPASE, BLOOD  URINALYSIS, COMPLETE (UACMP) WITH MICROSCOPIC   ____________________________________________  EKG  ED ECG REPORT I, , J, the attending physician, personally viewed and interpreted this ECG.   Date: 03/01/2019  EKG Time: 0158  Rate: 82  Rhythm: normal EKG, normal sinus rhythm  Axis: Normal  Intervals:none  ST&T Change: Nonspecific  ____________________________________________  RADIOLOGY  ED MD interpretation: Pending  Official radiology report(s): No results found.  ____________________________________________   PROCEDURES  Procedure(s) performed (including Critical Care):  Procedures   ____________________________________________   INITIAL IMPRESSION / ASSESSMENT AND PLAN / ED COURSE  As part of my medical decision making, I reviewed the following data within  the [place] notes reviewed and incorporated, Labs reviewed, Old chart reviewed, Radiograph reviewed and Notes from prior ED visits     Bryan Nelson was evaluated in Emergency Department on 03/01/2019 for the symptoms described in the history of present illness. He was evaluated in the context of the global COVID-19 pandemic, which necessitated  consideration that the patient might be at risk for infection with the SARS-CoV-2 virus that causes COVID-19. Institutional protocols and algorithms that pertain to the evaluation of patients at risk for COVID-19 are in a state of rapid change based on information released by regulatory bodies including the CDC and federal and state organizations. These policies and algorithms were followed during the patient's care in the ED.   85 year old male with history lymphoma, diverticular abscess requiring drainage who presents with left lower quadrant abdominal pain. Differential diagnosis includes, but is not limited to, acute appendicitis, renal colic, testicular torsion, urinary tract infection/pyelonephritis, prostatitis,  epididymitis, diverticulitis, small bowel obstruction or ileus, colitis, abdominal aortic aneurysm, gastroenteritis, hernia, etc.  Laboratory results notable for increasing creatinine over the past several months.  Given patient's history of diverticular abscess, will proceed with CT scan with oral contrast only.  Initiate IV fluid resuscitation, IV analgesia and an antiemetic.  Obtain COVID swab in the event patient requires hospitalization. I personally reviewed patient's chart and unfortunately see that his biopsy from 7/1 reveals invasive high-grade urothelial carcinoma with squamous differentiation.   Clinical Course as of Feb 29 704  Thu Mar 01, 2019  0703 Care transferred to Dr. [name] at change of shift. Pending results of CT scan and UA. If CT stable, then I anticipate patient may be discharged to follow up  with his oncologist this morning as scheduled.   [JS]    Clinical Course User Index [JS] Paulette Blanch, MD     ____________________________________________   FINAL CLINICAL IMPRESSION(S) / ED DIAGNOSES  Final diagnoses:  Left lower quadrant abdominal pain     ED Discharge Orders    None       Note:  This document was prepared using Dragon voice recognition software and may include unintentional dictation errors.   Paulette Blanch, MD 03/01/19 [phone]

## 2019-03-01 NOTE — Progress Notes (Signed)
Patient is here today to go over his biopsy results. Patient also went to the ED last night because he had abdominal pain. At the hospital he was diagnosed with hydronephrosis and was given Oxycodone-Acetaminophen but patient stated that he is refusing to take it.

## 2019-03-01 NOTE — ED Provider Notes (Signed)
Patient is in no distress, CT scan does show progression of his bladder diverticulum.  He was given Rocephin IV here.  He is going to see his oncologist this morning.  I will prescribe pain medicine as well.   Earleen Newport, MD 03/01/19 [phone]

## 2019-03-01 NOTE — Progress Notes (Signed)
[place]  [phone] [phone]   Name: Bryan Nelson Date: 03/01/2019 MRN: [id number]  DOB: August 02, 1932  Patient Care Team: [name], MD as PCP - General (Family Medicine) Dingeldein, Remo Lipps, MD as Consulting Physician (Ophthalmology) Hollice Espy, MD as Consulting Physician (Urology) Yolonda Kida, MD as Consulting Physician (Cardiology) Laneta Simmers as Physician Assistant (Urology)    REASON FOR CONSULTATION: Palliative Care consult requested for this 85 y.o. male with multiple medical problems including history of diffuse large B-cell lymphoma (initially diagnosed August 2015) who is status post 6 cycles of R-CHOP chemotherapy.  Abdominal CT scan February 01, 2019 revealed a 5 cm soft tissue mass along the left pelvic sidewall obstructing the left ureter.  Biopsy revealed high-grade urothelial carcinoma with squamous differentiation.  Patient was referred to palliative care to help address goals.   SOCIAL HISTORY:     reports that he quit smoking about 47 years ago. His smoking use included cigarettes. He has a 18.00 pack-year smoking history. He has never used smokeless tobacco. He reports that he does not drink alcohol or use drugs.   Patient is a widower.  He has no children.  He lives at home alone.  Patient's nephew is involved in his care.  ADVANCE DIRECTIVES:  Patient reports that his nephew is his healthcare power of attorney  CODE STATUS:   PAST MEDICAL HISTORY: Past Medical History:  Diagnosis Date   Arteriosclerosis of coronary artery 08/26/2011   Overview:      a.  1999 PCI of the mid LAD with stent.      b.  2002 Cath [facility]: EF 56%. RCA-distal 25/25%. Left main-50% ostial.  Left circumflex-25% OM2.  LAD-25% proximal.  75% mid.  25/25% distal. 75% D1.      c.  07/2011 PCI of RCA with DES.[facility]    Bleeding gastrointestinal 08/26/2011   Overview:      a.  Chronic  abdominal pain, present improving.      b.  Duodenitis and gastritis by EGD in 2000.      c.  Pylori in 2000, treated.      d.  Gastroesophageal reflux disease.      e.  Diverticular disease.     BP (high blood pressure) 08/26/2011   Closed dislocation of right ankle 09/10/2016   Successfully reduced in ER 09/10/2016   Diffuse large B-cell lymphoma ([place]) 2015   chemo tx's   GERD (gastroesophageal reflux disease)    Heart disease    Helicobacter pylori infection 06/18/2015   by EGD RX 08/18/1999    History of adenomatous polyp of colon 06/18/2015   HOH (hard of hearing)    Bilateral Hearing  Aids   Malleolar fracture (Right) 09/10/2016    PAST SURGICAL HISTORY:  Past Surgical History:  Procedure Laterality Date   ANGIOPLASTY     CATARACT EXTRACTION     COLONOSCOPY  2016   CORONARY ANGIOPLASTY     EYE SURGERY Bilateral    Cataract Extraction with IOL   GREEN LIGHT LASER TURP (TRANSURETHRAL RESECTION OF PROSTATE N/A 04/16/2015   Procedure: GREEN LIGHT LASER TURP (TRANSURETHRAL RESECTION OF PROSTATE WITH BLADDER BIOPSY;  Surgeon: Collier Flowers, MD;  Location: [facility] ORS;  Service: Urology;  Laterality: N/A;   heart stent placement     1998, 2000, 2012   TRANSURETHRAL RESECTION OF PROSTATE N/A 03/02/2017   Procedure: TRANSURETHRAL RESECTION OF THE PROSTATE (TURP);  Surgeon: Erlene Quan,  Caryl Pina, MD;  Location: [facility] ORS;  Service: Urology;  Laterality: N/A;    HEMATOLOGY/ONCOLOGY HISTORY:  Oncology History   No history exists.    ALLERGIES:  is allergic to ciprofloxacin; lisinopril; penicillins; bactrim [sulfamethoxazole-trimethoprim]; and sulfa antibiotics.  MEDICATIONS:  Current Outpatient Medications  Medication Sig Dispense Refill   atorvastatin (LIPITOR) 10 MG tablet TAKE ONE TABLET BY MOUTH ONCE DAILY 90 tablet 3   benazepril-hydrochlorthiazide (LOTENSIN HCT) 5-6.25 MG tablet Take 1 tablet by mouth once daily 90 tablet 4   Cholecalciferol (VITAMIN D3) 1000 units  CAPS Take 1,000 Units by mouth daily.     clopidogrel (PLAVIX) 75 MG tablet Take 1 tablet (75 mg total) by mouth daily. 90 tablet 4   famotidine (PEPCID) 20 MG tablet Take 1 tablet (20 mg total) by mouth 2 (two) times daily. 180 tablet 3   finasteride (PROSCAR) 5 MG tablet Take 1 tablet (5 mg total) by mouth daily. 90 tablet 3   gabapentin (NEURONTIN) 300 MG capsule Take 1 capsule (300 mg total) by mouth at bedtime. Do not take if you take temazepam 30 capsule 6   Magnesium 250 MG TABS Take 250 mg by mouth daily.     metoprolol succinate (TOPROL-XL) 25 MG 24 hr tablet Take 1 tablet (25 mg total) by mouth daily. 90 tablet 4   Multiple Vitamins-Minerals (MULTIVITAMIN ADULT PO) Take by mouth daily.     Multiple Vitamins-Minerals (VISION FORMULA 2 PO) Take by mouth daily.     nitrofurantoin, macrocrystal-monohydrate, (MACROBID) 100 MG capsule Take 1 capsule (100 mg total) by mouth 2 (two) times daily. 14 capsule 0   oxyCODONE-acetaminophen (PERCOCET) 5-325 MG tablet Take 1 tablet by mouth every 8 (eight) hours as needed. (Patient not taking: Reported on 03/01/2019) 20 tablet 0   psyllium (METAMUCIL) 58.6 % packet Take 1 packet by mouth daily.     No current facility-administered medications for this visit.     VITAL SIGNS: There were no vitals taken for this visit. There were no vitals filed for this visit.  Estimated body mass index is 33.99 kg/m as calculated from the following:   Height as of an earlier encounter on 03/01/19: 6' 1"  (1.854 m).   Weight as of an earlier encounter on 03/01/19: 257 lb 9.6 oz (116.8 kg).  LABS: CBC:    Component Value Date/Time   WBC 7.9 03/01/2019 0127   HGB 11.6 (L) 03/01/2019 0127   HGB 13.1 08/10/2018 0821   HCT 35.5 (L) 03/01/2019 0127   HCT 38.9 08/10/2018 0821   PLT 288 03/01/2019 0127   PLT 240 08/10/2018 0821   MCV 86.6 03/01/2019 0127   MCV 88 08/10/2018 0821   MCV 91 09/16/2014 0928   NEUTROABS 2.9 11/08/2016 1020   NEUTROABS 2.4  05/07/2016 1055   NEUTROABS 2.3 09/16/2014 0928   LYMPHSABS 1.0 11/08/2016 1020   LYMPHSABS 0.8 05/07/2016 1055   LYMPHSABS 0.5 (L) 09/16/2014 0928   MONOABS 0.6 11/08/2016 1020   MONOABS 0.7 09/16/2014 0928   EOSABS 0.5 11/08/2016 1020   EOSABS 0.5 (H) 05/07/2016 1055   EOSABS 0.2 09/16/2014 0928   BASOSABS 0.0 11/08/2016 1020   BASOSABS 0.0 05/07/2016 1055   BASOSABS 0.1 09/16/2014 0928   Comprehensive Metabolic Panel:    Component Value Date/Time   NA 138 03/01/2019 0127   NA 140 11/09/2018 0908   NA 144 09/16/2014 0928   K 4.4 03/01/2019 0127   K 3.5 09/16/2014 0928   CL 100 03/01/2019 0127  CL 105 09/16/2014 0928   CO2 26 03/01/2019 0127   CO2 31 09/16/2014 0928   BUN 31 (H) 03/01/2019 0127   BUN 19 11/09/2018 0908   BUN 17 09/16/2014 0928   CREATININE 2.08 (H) 03/01/2019 0127   CREATININE 1.01 09/16/2014 0928   GLUCOSE 119 (H) 03/01/2019 0127   GLUCOSE 106 (H) 09/16/2014 0928   CALCIUM 9.1 03/01/2019 0127   CALCIUM 8.4 (L) 09/16/2014 0928   AST 17 03/01/2019 0127   AST 20 09/16/2014 0928   ALT 15 03/01/2019 0127   ALT 33 09/16/2014 0928   ALKPHOS 71 03/01/2019 0127   ALKPHOS 76 09/16/2014 0928   BILITOT 0.7 03/01/2019 0127   BILITOT 0.3 11/09/2018 0908   BILITOT 0.3 09/16/2014 0928   PROT 7.7 03/01/2019 0127   PROT 6.5 11/09/2018 0908   PROT 6.3 (L) 09/16/2014 0928   ALBUMIN 4.2 03/01/2019 0127   ALBUMIN 4.3 11/09/2018 0908   ALBUMIN 3.4 09/16/2014 0928    RADIOGRAPHIC STUDIES: Ct Abdomen Pelvis Wo Contrast  Result Date: 03/01/2019 CLINICAL DATA:  Progressive left-sided abdominal pain. Invasive high-grade urothelial carcinoma with squamous differentiation. EXAM: CT ABDOMEN AND PELVIS WITHOUT CONTRAST TECHNIQUE: Multidetector CT imaging of the abdomen and pelvis was performed following the standard protocol without IV contrast. COMPARISON:  CT scan dated 02/01/2019 FINDINGS: Lower chest: Aortic atherosclerosis. Extensive coronary artery calcifications.  Heart size is normal. Minimal atelectasis at the left lung base. No effusions. Hepatobiliary: No focal liver abnormality is seen. No gallstones, gallbladder wall thickening, or biliary dilatation. Pancreas: Unremarkable. No pancreatic ductal dilatation or surrounding inflammatory changes. Spleen: Normal in size without focal abnormality. Adrenals/Urinary Tract: There has been progression of the irregular mass in the left side of the pelvis which probably arises from the superior aspect of a left-sided bladder diverticulum. The mass now measures approximately 4.2 x 4.2 x 4.0 cm. There is persistent left hydronephrosis due to distal ureteral obstruction by the mass. This is unchanged. There is slight soft tissue stranding from the mass to the left superolateral aspect of the dome of the bladder, probably representing tumor, new since the prior exam. There is a new nodular extension of the mass anteriorly seen on image 73 of series 2, 16 mm in diameter. There is new soft tissue stranding extending to the sigmoid portion of the colon best seen on images 76 and 77 of series 2. The mass also extends into the left psoas muscle on image 71 of series 2. Stomach/Bowel: There are few diverticula in the sigmoid colon. Other than the tumor stranding toward the sigmoid colon as described above, the bowel appears normal. Appendix is normal. Vascular/Lymphatic: Extensive aortic atherosclerosis. Reproductive: Prostate is unremarkable. Other: No free air or free fluid or abdominal wall hernia. Musculoskeletal: No acute abnormalities. Multilevel degenerative disc disease. Old anterior wedge deformity of T12. IMPRESSION: 1. There has been progression of the irregular mass in the left side of the pelvis which probably arises from the superior aspect of a left-sided bladder diverticulum. The mass now measures approximately 4.2 x 4.2 x 4.0 cm. The mass now has soft tissue stranding to the left psoas muscle, sigmoid portion of the colon,  and the dome of the bladder. 2. Persistent left hydronephrosis due to distal ureteral obstruction by the mass. 3. Aortic atherosclerosis and coronary artery calcifications. 4. Sigmoid diverticulosis without diverticulitis. 5. Old anterior wedge deformity of T12. Aortic Atherosclerosis (ICD10-I70.0). Electronically Signed   By: Lorriane Shire M.D.   On: 03/01/2019 07:30   Ct  Abdomen Pelvis Wo Contrast  Result Date: 02/01/2019 CLINICAL DATA:  Diffuse B-cell lymphoma. EXAM: CT ABDOMEN AND PELVIS WITHOUT CONTRAST TECHNIQUE: Multidetector CT imaging of the abdomen and pelvis was performed following the standard protocol without IV contrast. COMPARISON:  10/18/2018 FINDINGS: Lower chest: Coronary artery calcification is evident. Atherosclerotic calcification is noted in the wall of the thoracic aorta. Hepatobiliary: No suspicious focal abnormality within the liver parenchyma. There is no evidence for gallstones, gallbladder wall thickening, or pericholecystic fluid. No intrahepatic or extrahepatic biliary dilation. Pancreas: No focal mass lesion. No dilatation of the main duct. No intraparenchymal cyst. No peripancreatic edema. Spleen: No splenomegaly. No focal mass lesion. Adrenals/Urinary Tract: No adrenal nodule or mass. 4.8 cm low-density lesion upper pole right kidney is similar to prior and likely a cyst. Similar low-density lesions are seen in the interpolar right kidney and upper pole left kidney, also likely a cyst. Right ureter unremarkable. There is mild left hydroureteronephrosis. 5.0 x 4.9 cm irregular soft tissue lesion involves the distal left ureter with apparent posterior left bladder diverticulum distal to this pelvic sidewall lesion. Stomach/Bowel: Stomach is unremarkable. No gastric wall thickening. No evidence of outlet obstruction. Duodenum is normally positioned as is the ligament of Treitz. Insert duodenal diverticulum. No small bowel wall thickening. No small bowel dilatation. The terminal  ileum is normal. The appendix is not visualized, but there is no edema or inflammation in the region of the cecum. Diverticular changes are noted in the left colon without evidence of diverticulitis. Vascular/Lymphatic: There is abdominal aortic atherosclerosis without aneurysm. There is no gastrohepatic or hepatoduodenal ligament lymphadenopathy. No intraperitoneal or retroperitoneal lymphadenopathy. 13 mm short axis left external iliac lymph node with 16 mm short axis previously. Reproductive: The prostate gland and seminal vesicles are unremarkable. Other: No intraperitoneal free fluid. Musculoskeletal: Small left groin hernia contains only fat. No worrisome lytic or sclerotic osseous abnormality. IMPRESSION: 1. Irregular 4.9 x 5.0 cm soft tissue lesion along the left pelvic sidewall incorporates and obstructs the left ureter. This also appears to involve a posterior left bladder diverticulum and sigmoid colon. 2. Slight decrease in left external iliac lymphadenopathy. 3.  Aortic Atherosclerois (ICD10-170.0) Electronically Signed   By: Misty Stanley M.D.   On: 02/01/2019 13:33   Ct Biopsy  Result Date: 02/21/2019 INDICATION: Left pelvic sidewall enlarging mass. Remote history of lymphoma and recently treated for diverticulitis. EXAM: CT-GUIDED BIOPSY LEFT PELVIC SIDEWALL MASS MEDICATIONS: 1% LIDOCAINE LOCAL ANESTHESIA/SEDATION: 2.0 mg IV Versed; 50 mcg IV Fentanyl Moderate Sedation Time:  12 MINUTES The patient was continuously monitored during the procedure by the interventional radiology nurse under my direct supervision. PROCEDURE: The procedure, risks, benefits, and alternatives were explained to the patient. Questions regarding the procedure were encouraged and answered. The patient understands and consents to the procedure. Previous imaging reviewed. Patient positioned supine. Noncontrast localization CT performed. The left pelvic sidewall irregular enlarging soft tissue mass was localized and marked.  This was correlate with prior imaging. Overlying skin marked for an anterior oblique approach. Under sterile conditions and local anesthesia, a 17 gauge 11.8 cm access needle was advanced under CT guidance to the abnormality. Needle position confirmed with CT. 18 gauge core biopsies obtained. These were placed in saline. Postprocedure imaging demonstrates no hemorrhage or hematoma. Patient tolerated the procedure well without complication. Vital sign monitoring by nursing staff during the procedure will continue as patient is in the special procedures unit for post procedure observation. FINDINGS: The images document guide needle placement within the left pelvic  sidewall mass. Post biopsy images demonstrate no hemorrhage or hematoma. COMPLICATIONS: None immediate. IMPRESSION: Successful CT-guided core biopsy of the left pelvic sidewall enlarging indeterminate mass Electronically Signed   By: Jerilynn Mages.  Shick M.D.   On: 02/21/2019 15:00    PERFORMANCE STATUS (ECOG) : 1 - Symptomatic but completely ambulatory  Review of Systems Unless otherwise noted, a complete review of systems is negative.  Physical Exam General: NAD, frail appearing, thin Pulmonary: Unlabored Extremities: no edema, no joint deformities Skin: no rashes Neurological: Weakness but otherwise nonfocal  IMPRESSION: Patient was seen in the ER today for abdominal pain.  Likely pain is secondary to pelvic mass but cannot exclude UTI.  He was started on Percocet.  Patient met with Dr. Grayland Ormond today to discuss options for treatment.  He is likely being referred to radiation oncology with possible consideration of low-dose cisplatin chemotherapy.  At patient's request, I followed him outside the cancer center to meet with his nephew.  I introduced palliative care services and attempted establish therapeutic rapport.  We discussed use of prescribed Percocet for breakthrough pain.  Patient has been reluctant to use opioids due to fear of  addiction.  Patient took a MOST Form home with him to review.  Will discuss at time of future visit.  PLAN: -Agree with Percocet 5-325 mg every 8 hours as needed -Recommend Macrobid 100 mg twice daily x7 days -MOST Form reviewed and will need to be completed at time of future visit -We will continue conversations with patient regarding his goals -RTC to be determined when patient returns to see oncologist   Patient expressed understanding and was in agreement with this plan. He also understands that He can call the clinic at any time with any questions, concerns, or complaints.     Time Total: 20 minutes  Visit consisted of counseling and education dealing with the complex and emotionally intense issues of symptom management and palliative care in the setting of serious and potentially life-threatening illness.Greater than 50%  of this time was spent counseling and coordinating care related to the above assessment and plan.  Signed by: Altha Harm, PhD, NP-C [phone] (Work Cell)

## 2019-03-01 NOTE — ED Notes (Signed)
Patient able to self cath for urine specimen.

## 2019-03-01 NOTE — ED Notes (Signed)
Pt back from ct

## 2019-03-01 NOTE — ED Notes (Signed)
Ct called, pt finished 2nd bottle of contrast

## 2019-03-02 ENCOUNTER — Other Ambulatory Visit: Payer: Self-pay | Admitting: *Deleted

## 2019-03-02 ENCOUNTER — Encounter: Payer: Self-pay | Admitting: Oncology

## 2019-03-02 MED ORDER — OXYCODONE HCL 10 MG PO TABS: 10.0000 mg | ORAL_TABLET | ORAL | 0 refills | Status: DC | PRN

## 2019-03-02 MED ORDER — FENTANYL 25 MCG/HR TD PT72: 1.0000 | MEDICATED_PATCH | TRANSDERMAL | 0 refills | Status: DC

## 2019-03-02 NOTE — Telephone Encounter (Signed)
Patient called reporting that he will run out of pain medicine by Monday and that it really is not working  As he only gets relief for 3 -4 hrs He is asking for a refill and for an increase in dose or that something else be ordered  Please advise

## 2019-03-02 NOTE — Telephone Encounter (Signed)
Patient informed of medicine changes ans will pick up prescription when ready

## 2019-03-02 NOTE — Telephone Encounter (Signed)
Lets increase to 10mg  oxycodone q4-6 hours and and 56mcg fentanyl patch.

## 2019-03-03 DIAGNOSIS — C689 Malignant neoplasm of urinary organ, unspecified: Secondary | ICD-10-CM | POA: Insufficient documentation

## 2019-03-03 DIAGNOSIS — Z7189 Other specified counseling: Secondary | ICD-10-CM | POA: Insufficient documentation

## 2019-03-03 NOTE — Progress Notes (Signed)
Bryan Nelson  [phone]  [phone]     Bryan Nelson DOB: May 20, 1932  MR#: [id number]  MPN#:[id number]  Patient Care Team: [name], MD as PCP - General (Family Medicine) Dingeldein, Remo Lipps, MD as Consulting Physician (Ophthalmology) Hollice Espy, MD as Consulting Physician (Urology) Yolonda Kida, MD as Consulting Physician (Cardiology) Laneta Simmers as Physician Assistant (Urology)   CHIEF COMPLAINT: History of diffuse large B-cell lymphoma, now with urothelial carcinoma.  INTERVAL HISTORY: Patient returns to clinic today for further evaluation, discussion of his biopsy results, and treatment planning.  He has significant left flank pain and was evaluated in the emergency room earlier this morning.  He otherwise feels well.  He has no neurologic complaints.  He denies any recent fevers or illnesses.  He has no night sweats or unintentional weight loss.  He denies any chest pain, shortness of breath, cough, or hemoptysis.  He has no nausea, vomiting, constipation, or diarrhea.  He denies any melena or hematochezia.  He has no urinary complaints.  Patient offers no further specific complaints today.  REVIEW OF SYSTEMS:   Review of Systems  Constitutional: Negative.  Negative for diaphoresis, fever, malaise/fatigue and weight loss.  Respiratory: Negative.  Negative for cough and shortness of breath.   Cardiovascular: Negative.  Negative for chest pain and leg swelling.  Gastrointestinal: Negative.  Negative for abdominal pain, blood in stool and melena.  Genitourinary: Positive for flank pain. Negative for dysuria, frequency, hematuria and urgency.  Musculoskeletal: Negative for joint pain.  Skin: Negative.  Negative for rash.  Neurological: Negative.  Negative for dizziness, focal weakness, weakness and headaches.  Psychiatric/Behavioral: Negative.  The patient is not nervous/anxious.     As per HPI. Otherwise, a complete  review of systems is negative.  ONCOLOGY HISTORY: Oncology History   No history exists.    PAST MEDICAL HISTORY: Past Medical History:  Diagnosis Date   Arteriosclerosis of coronary artery 08/26/2011   Overview:      a.  1999 PCI of the mid LAD with stent.      b.  2002 [facility]: EF 56%. RCA-distal 25/25%. Left main-50% ostial.  Left circumflex-25% OM2.  LAD-25% proximal.  75% mid.  25/25% distal. 75% D1.      c.  07/2011 PCI of RCA with DES.[place]    Bleeding gastrointestinal 08/26/2011   Overview:      a.  Chronic abdominal pain, present improving.      b.  Duodenitis and gastritis by EGD in 2000.      c.  Pylori in 2000, treated.      d.  Gastroesophageal reflux disease.      e.  Diverticular disease.     BP (high blood pressure) 08/26/2011   Closed dislocation of right ankle 09/10/2016   Successfully reduced in ER 09/10/2016   Diffuse large B-cell lymphoma ([place]) 2015   chemo tx's   GERD (gastroesophageal reflux disease)    Heart disease    Helicobacter pylori infection 06/18/2015   by EGD RX 08/18/1999    History of adenomatous polyp of colon 06/18/2015   HOH (hard of hearing)    Bilateral Hearing  Aids   Malleolar fracture (Right) 09/10/2016    PAST SURGICAL HISTORY: Past Surgical History:  Procedure Laterality Date   ANGIOPLASTY     CATARACT EXTRACTION     COLONOSCOPY  2016   CORONARY ANGIOPLASTY     EYE SURGERY Bilateral    Cataract Extraction  with IOL   GREEN LIGHT LASER TURP (TRANSURETHRAL RESECTION OF PROSTATE N/A 04/16/2015   Procedure: GREEN LIGHT LASER TURP (TRANSURETHRAL RESECTION OF PROSTATE WITH BLADDER BIOPSY;  Surgeon: Collier Flowers, MD;  Location: [facility] ORS;  Service: Urology;  Laterality: N/A;   heart stent placement     1998, 2000, 2012   TRANSURETHRAL RESECTION OF PROSTATE N/A 03/02/2017   Procedure: TRANSURETHRAL RESECTION OF THE PROSTATE (TURP);  Surgeon: Hollice Espy, MD;  Location: [facility] ORS;  Service: Urology;  Laterality: N/A;     FAMILY HISTORY Family History  Problem Relation Age of Onset   Osteoporosis Mother    Alzheimer's disease Father    Kidney disease Neg Hx    Prostate cancer Neg Hx    Bladder Cancer Neg Hx    Kidney cancer Neg Hx    AVANCED DIRECTIVES:    HEALTH MAINTENANCE: Social History   Tobacco Use   Smoking status: Former Smoker    Packs/day: 1.50    Years: 12.00    Pack years: 18.00    Types: Cigarettes    Quit date: 08/24/1971    Years since quitting: 47.5   Smokeless tobacco: Never Used  Substance Use Topics   Alcohol use: No    Alcohol/week: 0.0 standard drinks   Drug use: No    Allergies  Allergen Reactions   Ciprofloxacin Diarrhea   Lisinopril Other (See Comments)    Unknown    Penicillins Swelling    unknwon reaction.  tolerates amoxicillin Has patient had a PCN reaction causing immediate rash, facial/tongue/throat swelling, SOB or lightheadedness with hypotension: No Has patient had a PCN reaction causing severe rash involving mucus membranes or skin necrosis: No Has patient had a PCN reaction that required hospitalization: No Has patient had a PCN reaction occurring within the last 10 years: Yes If all of the above answers are "NO", then may proceed with Cephalosporin use.    Bactrim [Sulfamethoxazole-Trimethoprim] Rash   Sulfa Antibiotics Itching and Rash    Current Outpatient Medications  Medication Sig Dispense Refill   atorvastatin (LIPITOR) 10 MG tablet TAKE ONE TABLET BY MOUTH ONCE DAILY 90 tablet 3   benazepril-hydrochlorthiazide (LOTENSIN HCT) 5-6.25 MG tablet Take 1 tablet by mouth once daily 90 tablet 4   Cholecalciferol (VITAMIN D3) 1000 units CAPS Take 1,000 Units by mouth daily.     clopidogrel (PLAVIX) 75 MG tablet Take 1 tablet (75 mg total) by mouth daily. 90 tablet 4   famotidine (PEPCID) 20 MG tablet Take 1 tablet (20 mg total) by mouth 2 (two) times daily. 180 tablet 3   finasteride (PROSCAR) 5 MG tablet Take 1 tablet (5  mg total) by mouth daily. 90 tablet 3   gabapentin (NEURONTIN) 300 MG capsule Take 1 capsule (300 mg total) by mouth at bedtime. Do not take if you take temazepam 30 capsule 6   Magnesium 250 MG TABS Take 250 mg by mouth daily.     metoprolol succinate (TOPROL-XL) 25 MG 24 hr tablet Take 1 tablet (25 mg total) by mouth daily. 90 tablet 4   Multiple Vitamins-Minerals (MULTIVITAMIN ADULT PO) Take by mouth daily.     Multiple Vitamins-Minerals (VISION FORMULA 2 PO) Take by mouth daily.     psyllium (METAMUCIL) 58.6 % packet Take 1 packet by mouth daily.     fentaNYL (DURAGESIC) 25 MCG/HR Place 1 patch onto the skin every 3 (three) days. 5 patch 0   nitrofurantoin, macrocrystal-monohydrate, (MACROBID) 100 MG capsule Take 1 capsule (100 mg  total) by mouth 2 (two) times daily. 14 capsule 0   Oxycodone HCl 10 MG TABS Take 1 tablet (10 mg total) by mouth every 4 (four) hours as needed. 90 tablet 0   sucralfate (CARAFATE) 1 g tablet TAKE 1 TABLET BY MOUTH TWICE DAILY AS NEEDED FOR  HEARTBURN  OR  REFLUX 60 tablet 0   No current facility-administered medications for this visit.     OBJECTIVE: BP (!) 170/79 (BP Location: Left Arm, Patient Position: Sitting)    Pulse 68    Temp (!) 94.5 F (34.7 C) (Tympanic)    Ht 6\' 1"  (1.854 m)    Wt 257 lb 9.6 oz (116.8 kg)    BMI 33.99 kg/m    Body mass index is 33.99 kg/m.    ECOG FS:0 - Asymptomatic  General: Well-developed, well-nourished, no acute distress. Eyes: Pink conjunctiva, anicteric sclera. HEENT: Normocephalic, moist mucous membranes, clear oropharnyx. Lungs: Clear to auscultation bilaterally. Heart: Regular rate and rhythm. No rubs, murmurs, or gallops. Abdomen: Soft, nontender, nondistended. No organomegaly noted, normoactive bowel sounds. Musculoskeletal: No edema, cyanosis, or clubbing. Neuro: Alert, answering all questions appropriately. Cranial nerves grossly intact. Skin: No rashes or petechiae noted. Psych: Normal  affect. Lymphatics: No cervical, calvicular, axillary or inguinal LAD.  LAB RESULTS:  Admission on 03/01/2019, Discharged on 03/01/2019  Component Date Value Ref Range Status   Lipase 03/01/2019 35  11 - 51 U/L Final   Performed at [facility], [address] [place], [place] [address]   Sodium 03/01/2019 138  135 - 145 mmol/L Final   Potassium 03/01/2019 4.4  3.5 - 5.1 mmol/L Final   Chloride 03/01/2019 100  98 - 111 mmol/L Final   CO2 03/01/2019 26  22 - 32 mmol/L Final   Glucose, Bld 03/01/2019 119* 70 - 99 mg/dL Final   BUN 03/01/2019 31* 8 - 23 mg/dL Final   Creatinine, Ser 03/01/2019 2.08* 0.61 - 1.24 mg/dL Final   Calcium 03/01/2019 9.1  8.9 - 10.3 mg/dL Final   Total Protein 03/01/2019 7.7  6.5 - 8.1 g/dL Final   Albumin 03/01/2019 4.2  3.5 - 5.0 g/dL Final   AST 03/01/2019 17  15 - 41 U/L Final   ALT 03/01/2019 15  0 - 44 U/L Final   Alkaline Phosphatase 03/01/2019 71  38 - 126 U/L Final   Total Bilirubin 03/01/2019 0.7  0.3 - 1.2 mg/dL Final   GFR calc non Af Amer 03/01/2019 28* >60 mL/min Final   GFR calc Af Amer 03/01/2019 33* >60 mL/min Final   Anion gap 03/01/2019 12  5 - 15 Final   Performed at [facility], [place], [place] [address]   WBC 03/01/2019 7.9  4.0 - 10.5 K/uL Final   RBC 03/01/2019 4.10* 4.22 - 5.81 MIL/uL Final   Hemoglobin 03/01/2019 11.6* 13.0 - 17.0 g/dL Final   HCT 03/01/2019 35.5* 39.0 - 52.0 % Final   MCV 03/01/2019 86.6  80.0 - 100.0 fL Final   MCH 03/01/2019 28.3  26.0 - 34.0 pg Final   MCHC 03/01/2019 32.7  30.0 - 36.0 g/dL Final   RDW 03/01/2019 14.7  11.5 - 15.5 % Final   Platelets 03/01/2019 288  150 - 400 K/uL Final   nRBC 03/01/2019 0.0  0.0 - 0.2 % Final   Performed at [facility], [place], [place] [address]   Color, Urine 03/01/2019 YELLOW* YELLOW Final   APPearance 03/01/2019 HAZY* CLEAR Final  Specific Gravity, Urine 03/01/2019 1.016   1.005 - 1.030 Final   pH 03/01/2019 6.0  5.0 - 8.0 Final   Glucose, UA 03/01/2019 NEGATIVE  NEGATIVE mg/dL Final   Hgb urine dipstick 03/01/2019 SMALL* NEGATIVE Final   Bilirubin Urine 03/01/2019 NEGATIVE  NEGATIVE Final   Ketones, ur 03/01/2019 NEGATIVE  NEGATIVE mg/dL Final   Protein, ur 03/01/2019 30* NEGATIVE mg/dL Final   Nitrite 03/01/2019 POSITIVE* NEGATIVE Final   Leukocytes,Ua 03/01/2019 MODERATE* NEGATIVE Final   RBC / HPF 03/01/2019 11-20  0 - 5 RBC/hpf Final   WBC, UA 03/01/2019 21-50  0 - 5 WBC/hpf Final   Bacteria, UA 03/01/2019 FEW* NONE SEEN Final   Squamous Epithelial / LPF 03/01/2019 NONE SEEN  0 - 5 Final   Performed at [facility], [address] [place], [place] [address]   SARS Coronavirus 2 03/01/2019 NEGATIVE  NEGATIVE Final   Comment: (NOTE) If result is NEGATIVE SARS-CoV-2 target nucleic acids are NOT DETECTED. The SARS-CoV-2 RNA is generally detectable in upper and lower  respiratory specimens during the acute phase of infection. The lowest  concentration of SARS-CoV-2 viral copies this assay can detect is 250  copies / mL. A negative result does not preclude SARS-CoV-2 infection  and should not be used as the sole basis for treatment or other  patient management decisions.  A negative result may occur with  improper specimen collection / handling, submission of specimen other  than nasopharyngeal swab, presence of viral mutation(s) within the  areas targeted by this assay, and inadequate number of viral copies  (<250 copies / mL). A negative result must be combined with clinical  observations, patient history, and epidemiological information. If result is POSITIVE SARS-CoV-2 target nucleic acids are DETECTED. The SARS-CoV-2 RNA is generally detectable in upper and lower  respiratory specimens dur                          ing the acute phase of infection.  Positive  results are indicative of active infection with SARS-CoV-2.   Clinical  correlation with patient history and other diagnostic information is  necessary to determine patient infection status.  Positive results do  not rule out bacterial infection or co-infection with other viruses. If result is PRESUMPTIVE POSTIVE SARS-CoV-2 nucleic acids MAY BE PRESENT.   A presumptive positive result was obtained on the submitted specimen  and confirmed on repeat testing.  While 2019 novel coronavirus  (SARS-CoV-2) nucleic acids may be present in the submitted sample  additional confirmatory testing may be necessary for epidemiological  and / or clinical management purposes  to differentiate between  SARS-CoV-2 and other Sarbecovirus currently known to infect humans.  If clinically indicated additional testing with an alternate test  methodology [id number]) is advised. The SARS-CoV-2 RNA is generally  detectable in upper and lower respiratory sp                          ecimens during the acute  phase of infection. The expected result is Negative. Fact Sheet for Patients:  StrictlyIdeas.no Fact Sheet for Healthcare Providers: BankingDealers.co.za This test is not yet approved or cleared by the [place] FDA and has been authorized for detection and/or diagnosis of SARS-CoV-2 by FDA under an Emergency Use Authorization (EUA).  This EUA will remain in effect (meaning this test can be used) for the duration of the COVID-19 declaration under Section 564(b)(1) of  the Act, 21 U.S.C. section 360bbb-3(b)(1), unless the authorization is terminated or revoked sooner. Performed at [facility], [address] [place], [place] [address]    Specimen Description 03/01/2019    Final                   Value:URINE, RANDOM Performed at [facility], [address] [place], [place] [address]    Special Requests 03/01/2019    Final                   Value:Normal Performed at [facility], [address] [place], [place] [address]    Culture 03/01/2019 *  Final                   Value:>=100,000 COLONIES/mL ESCHERICHIA COLI SUSCEPTIBILITIES TO FOLLOW Performed at [facility], [place] [address] [place], [place] [address]    Report Status 03/01/2019 PENDING   Incomplete    STUDIES: Ct Abdomen Pelvis Wo Contrast  Result Date: 03/01/2019 CLINICAL DATA:  Progressive left-sided abdominal pain. Invasive high-grade urothelial carcinoma with squamous differentiation. EXAM: CT ABDOMEN AND PELVIS WITHOUT CONTRAST TECHNIQUE: Multidetector CT imaging of the abdomen and pelvis was performed following the standard protocol without IV contrast. COMPARISON:  CT scan dated 02/01/2019 FINDINGS: Lower chest: Aortic atherosclerosis. Extensive coronary artery calcifications. Heart size is normal. Minimal atelectasis at the left lung base. No effusions. Hepatobiliary: No focal liver abnormality is seen. No gallstones, gallbladder wall thickening, or biliary dilatation. Pancreas: Unremarkable. No pancreatic ductal dilatation or surrounding inflammatory changes. Spleen: Normal in size without focal abnormality. Adrenals/Urinary Tract: There has been progression of the irregular mass in the left side of the pelvis which probably arises from the superior aspect of a left-sided bladder diverticulum. The mass now measures approximately 4.2 x 4.2 x 4.0 cm. There is persistent left hydronephrosis due to distal ureteral obstruction by the mass. This is unchanged. There is slight soft tissue stranding from the mass to the left superolateral aspect of the dome of the bladder, probably representing tumor, new since the prior exam. There is a new nodular extension of the mass anteriorly seen on image 73 of series 2, 16 mm in diameter. There is new soft tissue stranding extending to the sigmoid portion of the colon best seen on images 76 and 77 of series 2. The mass also extends into the left psoas muscle on image 71 of  series 2. Stomach/Bowel: There are few diverticula in the sigmoid colon. Other than the tumor stranding toward the sigmoid colon as described above, the bowel appears normal. Appendix is normal. Vascular/Lymphatic: Extensive aortic atherosclerosis. Reproductive: Prostate is unremarkable. Other: No free air or free fluid or abdominal wall hernia. Musculoskeletal: No acute abnormalities. Multilevel degenerative disc disease. Old anterior wedge deformity of T12. IMPRESSION: 1. There has been progression of the irregular mass in the left side of the pelvis which probably arises from the superior aspect of a left-sided bladder diverticulum. The mass now measures approximately 4.2 x 4.2 x 4.0 cm. The mass now has soft tissue stranding to the left psoas muscle, sigmoid portion of the colon, and the dome of the bladder. 2. Persistent left hydronephrosis due to distal ureteral obstruction by the mass. 3. Aortic atherosclerosis and coronary artery calcifications. 4. Sigmoid diverticulosis without diverticulitis. 5. Old anterior wedge deformity of T12. Aortic Atherosclerosis (ICD10-I70.0). Electronically Signed   By: Lorriane Shire M.D.   On: 03/01/2019 07:30     ASSESSMENT:  History of diffuse large B-cell lymphoma.  PLAN:    1. Diffuse Large B Cell Lymphoma: Patient was initially diagnosed in August 2015, pathology was noted to be anaplastic subtype.  He completed 6 cycles of R-CHOP chemotherapy in January 2016. PET scan January 2017 revealed no evidence of disease.  2.  Urothelial carcinoma: Confirmed by biopsy.  Patient's most recent CT scan on March 01, 2019 reviewed independently and reported as above with progressive left pelvic mass greater than 4 cm.  Patient appears to have no other evidence of disease.  Case discussed with urology.  Given  the fact that patient is symptomatic with pain and has localized disease, he may benefit from concurrent chemotherapy along with daily XRT.  If radiation therapy is not  possible, will likely give immunotherapy with Tecentriq every 3 weeks.  A referral was given to radiation oncology for next week.  Medical oncology follow-up will be based on radiation oncology evaluation. 3.  UTI: Patient was given a prescription for Macrobid today. 4.  Hydronephrosis: Case discussed with urology.  No need for stent at this time. 5.  Renal insufficiency: Patient's creatinine is trending up and is now 2.08.  Continue to monitor closely.  6.  Pain: Patient was given prescriptions for fentanyl and oxycodone.  I spent a total of 30 minutes face-to-face with the patient of which greater than 50% of the visit was spent in counseling and coordination of care as detailed above.   Patient expressed understanding and was in agreement with this plan. He also understands that He can call clinic at any time with any questions, concerns, or complaints.    Lloyd Huger, MD   03/03/2019 9:59 AM

## 2019-03-04 LAB — URINE CULTURE
Culture: >=100000 — AB
Special Requests: NORMAL

## 2019-03-05 ENCOUNTER — Institutional Professional Consult (permissible substitution): Payer: PPO | Admitting: Radiation Oncology

## 2019-03-05 ENCOUNTER — Telehealth: Payer: Self-pay | Admitting: *Deleted

## 2019-03-05 ENCOUNTER — Other Ambulatory Visit: Payer: Self-pay | Admitting: *Deleted

## 2019-03-05 DIAGNOSIS — C689 Malignant neoplasm of urinary organ, unspecified: Secondary | ICD-10-CM

## 2019-03-05 NOTE — Telephone Encounter (Signed)
Patient called asking what his treatment plan is for treating his cancer. He asks for a return call [phone]

## 2019-03-05 NOTE — Telephone Encounter (Signed)
I just talked with Crystal.  He should get appt with him this week. Barring any surprises, he will get concurrent XRT with weekly cis.

## 2019-03-05 NOTE — Telephone Encounter (Signed)
Referral placed, patient has appointment with Dr. Baruch Gouty this week.

## 2019-03-06 ENCOUNTER — Encounter: Payer: Self-pay | Admitting: Radiation Oncology

## 2019-03-06 ENCOUNTER — Ambulatory Visit
Admission: RE | Admit: 2019-03-06 | Discharge: 2019-03-06 | Disposition: A | Discharge status: Home / Self Care | Payer: PPO | Source: Ambulatory Visit | Attending: Radiation Oncology | Admitting: Radiation Oncology

## 2019-03-06 ENCOUNTER — Other Ambulatory Visit: Payer: Self-pay

## 2019-03-06 ENCOUNTER — Other Ambulatory Visit: Payer: Self-pay | Admitting: Oncology

## 2019-03-06 VITALS — BP 156/88 | HR 104 | Temp 97.1°F | Resp 18 | Wt 252.3 lb

## 2019-03-06 DIAGNOSIS — Z8572 Personal history of non-Hodgkin lymphomas: Secondary | ICD-10-CM | POA: Diagnosis not present

## 2019-03-06 DIAGNOSIS — Z8781 Personal history of (healed) traumatic fracture: Secondary | ICD-10-CM | POA: Diagnosis not present

## 2019-03-06 DIAGNOSIS — Z86010 Personal history of colonic polyps: Secondary | ICD-10-CM | POA: Diagnosis not present

## 2019-03-06 DIAGNOSIS — N131 Hydronephrosis with ureteral stricture, not elsewhere classified: Secondary | ICD-10-CM | POA: Diagnosis not present

## 2019-03-06 DIAGNOSIS — K219 Gastro-esophageal reflux disease without esophagitis: Secondary | ICD-10-CM | POA: Insufficient documentation

## 2019-03-06 DIAGNOSIS — Z87891 Personal history of nicotine dependence: Secondary | ICD-10-CM | POA: Insufficient documentation

## 2019-03-06 DIAGNOSIS — Z79899 Other long term (current) drug therapy: Secondary | ICD-10-CM | POA: Diagnosis not present

## 2019-03-06 DIAGNOSIS — C689 Malignant neoplasm of urinary organ, unspecified: Secondary | ICD-10-CM | POA: Insufficient documentation

## 2019-03-06 DIAGNOSIS — I2510 Atherosclerotic heart disease of native coronary artery without angina pectoris: Secondary | ICD-10-CM | POA: Insufficient documentation

## 2019-03-06 DIAGNOSIS — R339 Retention of urine, unspecified: Secondary | ICD-10-CM

## 2019-03-06 DIAGNOSIS — I519 Heart disease, unspecified: Secondary | ICD-10-CM | POA: Diagnosis not present

## 2019-03-06 MED ORDER — PROCHLORPERAZINE MALEATE 10 MG PO TABS: 10.0000 mg | ORAL_TABLET | Freq: Four times a day (QID) | ORAL | 2 refills | Status: DC | PRN

## 2019-03-06 MED ORDER — ONDANSETRON HCL 8 MG PO TABS: 8.0000 mg | ORAL_TABLET | Freq: Two times a day (BID) | ORAL | 2 refills | Status: DC | PRN

## 2019-03-06 NOTE — Consult Note (Signed)
NEW PATIENT EVALUATION  Name: Bryan Nelson  MRN: [id number]  Date:   03/06/2019     DOB: 01-29-1932   This 85 y.o. male patient presents to the clinic for initial evaluation of urothelial carcinoma presenting as a pelvic mass causing significant pain and patient with known history of diffuse large B-cell lymphoma.  REFERRING PHYSICIAN: [name], MD  CHIEF COMPLAINT:  Chief Complaint  Patient presents with  . Cancer    Initial Evaluation for Rad Tx    DIAGNOSIS: The encounter diagnosis was Urothelial carcinoma ([place]).   PREVIOUS INVESTIGATIONS:  CT scans reviewed Pathology report reviewed Clinical notes reviewed  HPI: Patient is an 85 year old male treated under the direction of Dr. Grayland Ormond for diffuse large B-cell lymphoma.  He has been having increasing significant left flank pain over the past several months presenting to the emergency room in early July.  He was found on CT scan to have progression of an irregular mass in the left side of the pelvis probably arising from the superior aspect of a left-sided bladder diverticulum measuring 4.2 x 4.2 cm.  Tissue strands into the left psoas muscle to the sigmoid portion of the colon and the dome of the bladder.  He also has left hydronephrosis due to distal ureteral obstruction.  CT-guided biopsy of the mass performed on 7 1 showed invasive high-grade urothelial carcinoma with squamous differentiation.  His diffuse B-cell lymphoma is in complete remission by PET/CT criteria.  He is seen today for consideration of palliative radiation therapy to his pelvic mass along with concurrent chemotherapy.  He specifically denies any hematuria tends towards constipation.  PLANNED TREATMENT REGIMEN: Concurrent chemoradiation therapy  PAST MEDICAL HISTORY:  has a past medical history of Arteriosclerosis of coronary artery (08/26/2011), Bleeding gastrointestinal (08/26/2011), BP (high blood pressure) (08/26/2011), Closed dislocation of right ankle  (09/10/2016), Diffuse large B-cell lymphoma ([place]) (2015), GERD (gastroesophageal reflux disease), Heart disease, Helicobacter pylori infection (06/18/2015), History of adenomatous polyp of colon (06/18/2015), HOH (hard of hearing), and Malleolar fracture (Right) (09/10/2016).    PAST SURGICAL HISTORY:  Past Surgical History:  Procedure Laterality Date  . ANGIOPLASTY    . CATARACT EXTRACTION    . COLONOSCOPY  2016  . CORONARY ANGIOPLASTY    . EYE SURGERY Bilateral    Cataract Extraction with IOL  . GREEN LIGHT LASER TURP (TRANSURETHRAL RESECTION OF PROSTATE N/A 04/16/2015   Procedure: GREEN LIGHT LASER TURP (TRANSURETHRAL RESECTION OF PROSTATE WITH BLADDER BIOPSY;  Surgeon: Collier Flowers, MD;  Location: [facility] ORS;  Service: Urology;  Laterality: N/A;  . heart stent placement     1998, 2000, 2012  . TRANSURETHRAL RESECTION OF PROSTATE N/A 03/02/2017   Procedure: TRANSURETHRAL RESECTION OF THE PROSTATE (TURP);  Surgeon: Hollice Espy, MD;  Location: [facility] ORS;  Service: Urology;  Laterality: N/A;    FAMILY HISTORY: family history includes Alzheimer's disease in his father; Osteoporosis in his mother.  SOCIAL HISTORY:  reports that he quit smoking about 47 years ago. His smoking use included cigarettes. He has a 18.00 pack-year smoking history. He has never used smokeless tobacco. He reports that he does not drink alcohol or use drugs.  ALLERGIES: Ciprofloxacin, Lisinopril, Penicillins, Bactrim [sulfamethoxazole-trimethoprim], and Sulfa antibiotics  MEDICATIONS:  Current Outpatient Medications  Medication Sig Dispense Refill  . atorvastatin (LIPITOR) 10 MG tablet TAKE ONE TABLET BY MOUTH ONCE DAILY 90 tablet 3  . benazepril-hydrochlorthiazide (LOTENSIN HCT) 5-6.25 MG tablet Take 1 tablet by mouth once daily 90 tablet 4  .  Cholecalciferol (VITAMIN D3) 1000 units CAPS Take 1,000 Units by mouth daily.    . clopidogrel (PLAVIX) 75 MG tablet Take 1 tablet (75 mg total) by mouth daily. 90 tablet 4   . famotidine (PEPCID) 20 MG tablet Take 1 tablet (20 mg total) by mouth 2 (two) times daily. 180 tablet 3  . fentaNYL (DURAGESIC) 25 MCG/HR Place 1 patch onto the skin every 3 (three) days. 5 patch 0  . finasteride (PROSCAR) 5 MG tablet Take 1 tablet (5 mg total) by mouth daily. 90 tablet 3  . gabapentin (NEURONTIN) 300 MG capsule Take 1 capsule (300 mg total) by mouth at bedtime. Do not take if you take temazepam 30 capsule 6  . Magnesium 250 MG TABS Take 250 mg by mouth daily.    . metoprolol succinate (TOPROL-XL) 25 MG 24 hr tablet Take 1 tablet (25 mg total) by mouth daily. 90 tablet 4  . Multiple Vitamins-Minerals (MULTIVITAMIN ADULT PO) Take by mouth daily.    . Multiple Vitamins-Minerals (VISION FORMULA 2 PO) Take by mouth daily.    . nitrofurantoin, macrocrystal-monohydrate, (MACROBID) 100 MG capsule Take 1 capsule (100 mg total) by mouth 2 (two) times daily. 14 capsule 0  . Oxycodone HCl 10 MG TABS Take 1 tablet (10 mg total) by mouth every 4 (four) hours as needed. 90 tablet 0  . psyllium (METAMUCIL) 58.6 % packet Take 1 packet by mouth daily.    . sucralfate (CARAFATE) 1 g tablet TAKE 1 TABLET BY MOUTH TWICE DAILY AS NEEDED FOR  HEARTBURN  OR  REFLUX 60 tablet 0   No current facility-administered medications for this encounter.     ECOG PERFORMANCE STATUS:  1 - Symptomatic but completely ambulatory  REVIEW OF SYSTEMS: Patient denies any weight loss, fatigue, weakness, fever, chills or night sweats. Patient denies any loss of vision, blurred vision. Patient denies any ringing  of the ears or hearing loss. No irregular heartbeat. Patient denies heart murmur or history of fainting. Patient denies any chest pain or pain radiating to her upper extremities. Patient denies any shortness of breath, difficulty breathing at night, cough or hemoptysis. Patient denies any swelling in the lower legs. Patient denies any nausea vomiting, vomiting of blood, or coffee ground material in the vomitus.  Patient denies any stomach pain. Patient states has had normal bowel movements no significant constipation or diarrhea. Patient denies any dysuria, hematuria or significant nocturia. Patient denies any problems walking, swelling in the joints or loss of balance. Patient denies any skin changes, loss of hair or loss of weight. Patient denies any excessive worrying or anxiety or significant depression. Patient denies any problems with insomnia. Patient denies excessive thirst, polyuria, polydipsia. Patient denies any swollen glands, patient denies easy bruising or easy bleeding. Patient denies any recent infections, allergies or URI. Patient "s visual fields have not changed significantly in recent time.   PHYSICAL EXAM: BP (!) 156/88   Pulse (!) 104   Temp (!) 97.1 F (36.2 C)   Resp 18   Wt 252 lb 5.1 oz (114.4 kg)   BMI 33.29 kg/m  Well-developed well-nourished patient in NAD. HEENT reveals PERLA, EOMI, discs not visualized.  Oral cavity is clear. No oral mucosal lesions are identified. Neck is clear without evidence of cervical or supraclavicular adenopathy. Lungs are clear to A&P. Cardiac examination is essentially unremarkable with regular rate and rhythm without murmur rub or thrill. Abdomen is benign with no organomegaly or masses noted. Motor sensory and DTR levels are  equal and symmetric in the upper and lower extremities. Cranial nerves II through XII are grossly intact. Proprioception is intact. No peripheral adenopathy or edema is identified. No motor or sensory levels are noted. Crude visual fields are within normal range.  LABORATORY DATA: Pathology report reviewed    RADIOLOGY RESULTS: CT scans reviewed   IMPRESSION: High-grade urothelial carcinoma presenting as a pelvic mass probably arising from a bladder diverticulum an 85 year old male causing narcotic dependent pain  PLAN: At this time elected ahead with concurrent chemoradiation.  I would plan initially treating to 4500  centigrade to his pelvis.  I would concentrate on the area of soft tissue involvement by CT criteria and other regional lymph nodes in that area.  Risks and benefits of treatment including increased lower urinary tract symptoms diarrhea fatigue alteration of blood counts skin reaction all were described in detail to the patient.  I have personally set up and ordered CT simulation for tomorrow.  There will be extra effort by both professional staff as well as technical staff to coordinate and manage concurrent chemoradiation and ensuing side effects during his treatments. Patient comprehends my treatment plan well.  I would like to take this opportunity to thank you for allowing me to participate in the care of your patient.Noreene Filbert, MD

## 2019-03-06 NOTE — Progress Notes (Signed)
START ON PATHWAY REGIMEN - Bladder     A cycle is every 7 days:     Cisplatin   **Always confirm dose/schedule in your pharmacy ordering system**  Patient Characteristics: Pre-Cystectomy or Nonsurgical Candidate (Clinical Staging), cT2-4a, cN0-1, M0, Cystectomy Ineligible Therapeutic Status: Pre-Cystectomy or Nonsurgical Candidate (Clinical Staging) AJCC M Category: cM0 AJCC 8 Stage Grouping: Unknown AJCC T Category: cTX AJCC N Category: cN1 Intent of Therapy: Non-Curative / Palliative Intent, Discussed with Patient

## 2019-03-07 ENCOUNTER — Ambulatory Visit
Admission: RE | Admit: 2019-03-07 | Discharge: 2019-03-07 | Disposition: A | Discharge status: Home / Self Care | Payer: PPO | Source: Ambulatory Visit | Attending: Radiation Oncology | Admitting: Radiation Oncology

## 2019-03-07 ENCOUNTER — Ambulatory Visit: Payer: PPO | Admitting: Family Medicine

## 2019-03-07 ENCOUNTER — Other Ambulatory Visit: Payer: Self-pay

## 2019-03-07 DIAGNOSIS — Z510 Encounter for antineoplastic radiation therapy: Secondary | ICD-10-CM | POA: Insufficient documentation

## 2019-03-07 DIAGNOSIS — C689 Malignant neoplasm of urinary organ, unspecified: Secondary | ICD-10-CM | POA: Insufficient documentation

## 2019-03-08 ENCOUNTER — Other Ambulatory Visit: Payer: PPO

## 2019-03-08 ENCOUNTER — Other Ambulatory Visit: Payer: Self-pay | Admitting: *Deleted

## 2019-03-08 DIAGNOSIS — Z510 Encounter for antineoplastic radiation therapy: Secondary | ICD-10-CM | POA: Diagnosis not present

## 2019-03-08 DIAGNOSIS — C689 Malignant neoplasm of urinary organ, unspecified: Secondary | ICD-10-CM

## 2019-03-08 NOTE — Progress Notes (Signed)
Tumor Board Documentation  Bryan Nelson was presented by Dr Grayland Ormond at our Tumor Board on 03/08/2019, which included representatives from medical oncology, radiation oncology, surgical oncology, surgical, radiology, pathology, pharmacy, internal medicine, navigation, research, palliative care, pulmonology.  Bryan Nelson currently presents as a current patient, for [place], for discussion with history of the following treatments: active survellience.  Additionally, we reviewed previous medical and familial history, history of present illness, and recent lab results along with all available histopathologic and imaging studies. The tumor board considered available treatment options and made the following recommendations: Concurrent chemo-radiation therapy Rfer to Dr Erlene Quan for Stent placement  The following procedures/referrals were also placed: No orders of the defined types were placed in this encounter.   Clinical Trial Status: not discussed   Staging used: AJCC Stage Group  AJCC Staging:       Group: High Grade Urothelial carcinoma favor Squamous Cell   National site-specific guidelines NCCN were discussed with respect to the case.  Tumor board is a meeting of clinicians from various specialty areas who evaluate and discuss patients for whom a multidisciplinary approach is being considered. Final determinations in the plan of care are those of the provider(s). The responsibility for follow up of recommendations given during tumor board is that of the provider.   Today's extended care, comprehensive team conference, Bryan Nelson was not present for the discussion and was not examined.   Multidisciplinary Tumor Board is a multidisciplinary case peer review process.  Decisions discussed in the Multidisciplinary Tumor Board reflect the opinions of the specialists present at the conference without having examined the patient.  Ultimately, treatment and diagnostic decisions rest with the primary provider(s)  and the patient.

## 2019-03-09 ENCOUNTER — Telehealth (INDEPENDENT_AMBULATORY_CARE_PROVIDER_SITE_OTHER): Payer: PPO | Admitting: Urology

## 2019-03-09 ENCOUNTER — Other Ambulatory Visit: Payer: Self-pay

## 2019-03-09 DIAGNOSIS — N323 Diverticulum of bladder: Secondary | ICD-10-CM

## 2019-03-09 DIAGNOSIS — C679 Malignant neoplasm of bladder, unspecified: Secondary | ICD-10-CM | POA: Diagnosis not present

## 2019-03-09 DIAGNOSIS — C7989 Secondary malignant neoplasm of other specified sites: Secondary | ICD-10-CM | POA: Diagnosis not present

## 2019-03-09 DIAGNOSIS — R339 Retention of urine, unspecified: Secondary | ICD-10-CM

## 2019-03-09 DIAGNOSIS — N1330 Unspecified hydronephrosis: Secondary | ICD-10-CM

## 2019-03-11 NOTE — Progress Notes (Deleted)
Bryan Nelson  [phone]  [phone]     Bryan Nelson DOB: Mar 18, 1932  MR#: [id number]  KDT#:[id number]  Patient Care Team: [name], MD as PCP - General (Family Medicine) Dingeldein, Remo Lipps, MD as Consulting Physician (Ophthalmology) Hollice Espy, MD as Consulting Physician (Urology) Yolonda Kida, MD as Consulting Physician (Cardiology) Laneta Simmers as Physician Assistant (Urology)   CHIEF COMPLAINT: History of diffuse large B-cell lymphoma, now with urothelial carcinoma.  INTERVAL HISTORY: Patient returns to clinic today for further evaluation, discussion of his biopsy results, and treatment planning.  He has significant left flank pain and was evaluated in the emergency room earlier this morning.  He otherwise feels well.  He has no neurologic complaints.  He denies any recent fevers or illnesses.  He has no night sweats or unintentional weight loss.  He denies any chest pain, shortness of breath, cough, or hemoptysis.  He has no nausea, vomiting, constipation, or diarrhea.  He denies any melena or hematochezia.  He has no urinary complaints.  Patient offers no further specific complaints today.  REVIEW OF SYSTEMS:   Review of Systems  Constitutional: Negative.  Negative for diaphoresis, fever, malaise/fatigue and weight loss.  Respiratory: Negative.  Negative for cough and shortness of breath.   Cardiovascular: Negative.  Negative for chest pain and leg swelling.  Gastrointestinal: Negative.  Negative for abdominal pain, blood in stool and melena.  Genitourinary: Positive for flank pain. Negative for dysuria, frequency, hematuria and urgency.  Musculoskeletal: Negative for joint pain.  Skin: Negative.  Negative for rash.  Neurological: Negative.  Negative for dizziness, focal weakness, weakness and headaches.  Psychiatric/Behavioral: Negative.  The patient is not nervous/anxious.     As per HPI. Otherwise, a complete  review of systems is negative.  ONCOLOGY HISTORY: Oncology History   No history exists.    PAST MEDICAL HISTORY: Past Medical History:  Diagnosis Date  . Arteriosclerosis of coronary artery 08/26/2011   Overview:      a.  1999 PCI of the mid LAD with stent.      b.  2002 Cath [facility]: EF 56%. RCA-distal 25/25%. Left main-50% ostial.  Left circumflex-25% OM2.  LAD-25% proximal.  75% mid.  25/25% distal. 75% D1.      c.  07/2011 PCI of RCA with DES.[name]   . Bleeding gastrointestinal 08/26/2011   Overview:      a.  Chronic abdominal pain, present improving.      b.  Duodenitis and gastritis by EGD in 2000.      c.  Pylori in 2000, treated.      d.  Gastroesophageal reflux disease.      e.  Diverticular disease.    . BP (high blood pressure) 08/26/2011  . Closed dislocation of right ankle 09/10/2016   Successfully reduced in ER 09/10/2016  . Diffuse large B-cell lymphoma ([place]) 2015   chemo tx's  . GERD (gastroesophageal reflux disease)   . Heart disease   . Helicobacter pylori infection 06/18/2015   by EGD RX 08/18/1999   . History of adenomatous polyp of colon 06/18/2015  . HOH (hard of hearing)    Bilateral Hearing  Aids  . Malleolar fracture (Right) 09/10/2016    PAST SURGICAL HISTORY: Past Surgical History:  Procedure Laterality Date  . ANGIOPLASTY    . CATARACT EXTRACTION    . COLONOSCOPY  2016  . CORONARY ANGIOPLASTY    . EYE SURGERY Bilateral    Cataract Extraction  with IOL  . GREEN LIGHT LASER TURP (TRANSURETHRAL RESECTION OF PROSTATE N/A 04/16/2015   Procedure: GREEN LIGHT LASER TURP (TRANSURETHRAL RESECTION OF PROSTATE WITH BLADDER BIOPSY;  Surgeon: Collier Flowers, MD;  Location: [facility] ORS;  Service: Urology;  Laterality: N/A;  . heart stent placement     1998, 2000, 2012  . TRANSURETHRAL RESECTION OF PROSTATE N/A 03/02/2017   Procedure: TRANSURETHRAL RESECTION OF THE PROSTATE (TURP);  Surgeon: Hollice Espy, MD;  Location: [facility] ORS;  Service: Urology;  Laterality: N/A;     FAMILY HISTORY Family History  Problem Relation Age of Onset  . Osteoporosis Mother   . Alzheimer's disease Father   . Kidney disease Neg Hx   . Prostate cancer Neg Hx   . Bladder Cancer Neg Hx   . Kidney cancer Neg Hx    AVANCED DIRECTIVES:    HEALTH MAINTENANCE: Social History   Tobacco Use  . Smoking status: Former Smoker    Packs/day: 1.50    Years: 12.00    Pack years: 18.00    Types: Cigarettes    Quit date: 08/24/1971    Years since quitting: 47.5  . Smokeless tobacco: Never Used  Substance Use Topics  . Alcohol use: No    Alcohol/week: 0.0 standard drinks  . Drug use: No    Allergies  Allergen Reactions  . Ciprofloxacin Diarrhea  . Lisinopril Other (See Comments)    Unknown   . Penicillins Swelling    unknwon reaction.  tolerates amoxicillin Has patient had a PCN reaction causing immediate rash, facial/tongue/throat swelling, SOB or lightheadedness with hypotension: No Has patient had a PCN reaction causing severe rash involving mucus membranes or skin necrosis: No Has patient had a PCN reaction that required hospitalization: No Has patient had a PCN reaction occurring within the last 10 years: Yes If all of the above answers are "NO", then may proceed with Cephalosporin use.   . Bactrim [Sulfamethoxazole-Trimethoprim] Rash  . Sulfa Antibiotics Itching and Rash    Current Outpatient Medications  Medication Sig Dispense Refill  . atorvastatin (LIPITOR) 10 MG tablet TAKE ONE TABLET BY MOUTH ONCE DAILY 90 tablet 3  . benazepril-hydrochlorthiazide (LOTENSIN HCT) 5-6.25 MG tablet Take 1 tablet by mouth once daily 90 tablet 4  . Cholecalciferol (VITAMIN D3) 1000 units CAPS Take 1,000 Units by mouth daily.    . clopidogrel (PLAVIX) 75 MG tablet Take 1 tablet (75 mg total) by mouth daily. 90 tablet 4  . famotidine (PEPCID) 20 MG tablet Take 1 tablet (20 mg total) by mouth 2 (two) times daily. 180 tablet 3  . fentaNYL (DURAGESIC) 25 MCG/HR Place 1 patch onto the  skin every 3 (three) days. 5 patch 0  . finasteride (PROSCAR) 5 MG tablet Take 1 tablet (5 mg total) by mouth daily. 90 tablet 3  . gabapentin (NEURONTIN) 300 MG capsule Take 1 capsule (300 mg total) by mouth at bedtime. Do not take if you take temazepam 30 capsule 6  . Magnesium 250 MG TABS Take 250 mg by mouth daily.    . metoprolol succinate (TOPROL-XL) 25 MG 24 hr tablet Take 1 tablet (25 mg total) by mouth daily. 90 tablet 4  . Multiple Vitamins-Minerals (MULTIVITAMIN ADULT PO) Take by mouth daily.    . Multiple Vitamins-Minerals (VISION FORMULA 2 PO) Take by mouth daily.    . nitrofurantoin, macrocrystal-monohydrate, (MACROBID) 100 MG capsule Take 1 capsule (100 mg total) by mouth 2 (two) times daily. 14 capsule 0  . ondansetron (ZOFRAN) 8  MG tablet Take 1 tablet (8 mg total) by mouth 2 (two) times daily as needed. 30 tablet 2  . Oxycodone HCl 10 MG TABS Take 1 tablet (10 mg total) by mouth every 4 (four) hours as needed. 90 tablet 0  . prochlorperazine (COMPAZINE) 10 MG tablet Take 1 tablet (10 mg total) by mouth every 6 (six) hours as needed (Nausea or vomiting). 60 tablet 2  . psyllium (METAMUCIL) 58.6 % packet Take 1 packet by mouth daily.    . sucralfate (CARAFATE) 1 g tablet TAKE 1 TABLET BY MOUTH TWICE DAILY AS NEEDED FOR  HEARTBURN  OR  REFLUX 60 tablet 0   No current facility-administered medications for this visit.     OBJECTIVE: There were no vitals taken for this visit.   There is no height or weight on file to calculate BMI.    ECOG FS:0 - Asymptomatic  General: Well-developed, well-nourished, no acute distress. Eyes: Pink conjunctiva, anicteric sclera. HEENT: Normocephalic, moist mucous membranes, clear oropharnyx. Lungs: Clear to auscultation bilaterally. Heart: Regular rate and rhythm. No rubs, murmurs, or gallops. Abdomen: Soft, nontender, nondistended. No organomegaly noted, normoactive bowel sounds. Musculoskeletal: No edema, cyanosis, or clubbing. Neuro: Alert,  answering all questions appropriately. Cranial nerves grossly intact. Skin: No rashes or petechiae noted. Psych: Normal affect. Lymphatics: No cervical, calvicular, axillary or inguinal LAD.  LAB RESULTS:  No visits with results within 3 Day(s) from this visit.  Latest known visit with results is:  Admission on 03/01/2019, Discharged on 03/01/2019  Component Date Value Ref Range Status  . Lipase 03/01/2019 35  11 - 51 U/L Final   Performed at [facility], [address] [place], [place] [address]  . Sodium 03/01/2019 138  135 - 145 mmol/L Final  . Potassium 03/01/2019 4.4  3.5 - 5.1 mmol/L Final  . Chloride 03/01/2019 100  98 - 111 mmol/L Final  . CO2 03/01/2019 26  22 - 32 mmol/L Final  . Glucose, Bld 03/01/2019 119* 70 - 99 mg/dL Final  . BUN 03/01/2019 31* 8 - 23 mg/dL Final  . Creatinine, Ser 03/01/2019 2.08* 0.61 - 1.24 mg/dL Final  . Calcium 03/01/2019 9.1  8.9 - 10.3 mg/dL Final  . Total Protein 03/01/2019 7.7  6.5 - 8.1 g/dL Final  . Albumin 03/01/2019 4.2  3.5 - 5.0 g/dL Final  . AST 03/01/2019 17  15 - 41 U/L Final  . ALT 03/01/2019 15  0 - 44 U/L Final  . Alkaline Phosphatase 03/01/2019 71  38 - 126 U/L Final  . Total Bilirubin 03/01/2019 0.7  0.3 - 1.2 mg/dL Final  . GFR calc non Af Amer 03/01/2019 28* >60 mL/min Final  . GFR calc Af Amer 03/01/2019 33* >60 mL/min Final  . Anion gap 03/01/2019 12  5 - 15 Final   Performed at [facility], [address], [place], [place] [address]  . WBC 03/01/2019 7.9  4.0 - 10.5 K/uL Final  . RBC 03/01/2019 4.10* 4.22 - 5.81 MIL/uL Final  . Hemoglobin 03/01/2019 11.6* 13.0 - 17.0 g/dL Final  . HCT 03/01/2019 35.5* 39.0 - 52.0 % Final  . MCV 03/01/2019 86.6  80.0 - 100.0 fL Final  . MCH 03/01/2019 28.3  26.0 - 34.0 pg Final  . MCHC 03/01/2019 32.7  30.0 - 36.0 g/dL Final  . RDW 03/01/2019 14.7  11.5 - 15.5 % Final  . Platelets 03/01/2019 288  150 - 400 K/uL Final  . nRBC 03/01/2019 0.0  0.0 - 0.2 %  Final    Performed at [facility], [address], [place], [place] [address]  . Color, Urine 03/01/2019 YELLOW* YELLOW Final  . APPearance 03/01/2019 HAZY* CLEAR Final  . Specific Gravity, Urine 03/01/2019 1.016  1.005 - 1.030 Final  . pH 03/01/2019 6.0  5.0 - 8.0 Final  . Glucose, UA 03/01/2019 NEGATIVE  NEGATIVE mg/dL Final  . Hgb urine dipstick 03/01/2019 SMALL* NEGATIVE Final  . Bilirubin Urine 03/01/2019 NEGATIVE  NEGATIVE Final  . Ketones, ur 03/01/2019 NEGATIVE  NEGATIVE mg/dL Final  . Protein, ur 03/01/2019 30* NEGATIVE mg/dL Final  . Nitrite 03/01/2019 POSITIVE* NEGATIVE Final  . Leukocytes,Ua 03/01/2019 MODERATE* NEGATIVE Final  . RBC / HPF 03/01/2019 11-20  0 - 5 RBC/hpf Final  . WBC, UA 03/01/2019 21-50  0 - 5 WBC/hpf Final  . Bacteria, UA 03/01/2019 FEW* NONE SEEN Final  . Squamous Epithelial / LPF 03/01/2019 NONE SEEN  0 - 5 Final   Performed at [facility], [address], [place], [place] [address]  . SARS Coronavirus 2 03/01/2019 NEGATIVE  NEGATIVE Final   Comment: (NOTE) If result is NEGATIVE SARS-CoV-2 target nucleic acids are NOT DETECTED. The SARS-CoV-2 RNA is generally detectable in upper and lower  respiratory specimens during the acute phase of infection. The lowest  concentration of SARS-CoV-2 viral copies this assay can detect is 250  copies / mL. A negative result does not preclude SARS-CoV-2 infection  and should not be used as the sole basis for treatment or other  patient management decisions.  A negative result may occur with  improper specimen collection / handling, submission of specimen other  than nasopharyngeal swab, presence of viral mutation(s) within the  areas targeted by this assay, and inadequate number of viral copies  (<250 copies / mL). A negative result must be combined with clinical  observations, patient history, and epidemiological information. If result is POSITIVE SARS-CoV-2 target nucleic acids are DETECTED. The  SARS-CoV-2 RNA is generally detectable in upper and lower  respiratory specimens dur                          ing the acute phase of infection.  Positive  results are indicative of active infection with SARS-CoV-2.  Clinical  correlation with patient history and other diagnostic information is  necessary to determine patient infection status.  Positive results do  not rule out bacterial infection or co-infection with other viruses. If result is PRESUMPTIVE POSTIVE SARS-CoV-2 nucleic acids MAY BE PRESENT.   A presumptive positive result was obtained on the submitted specimen  and confirmed on repeat testing.  While 2019 novel coronavirus  (SARS-CoV-2) nucleic acids may be present in the submitted sample  additional confirmatory testing may be necessary for epidemiological  and / or clinical management purposes  to differentiate between  SARS-CoV-2 and other Sarbecovirus currently known to infect humans.  If clinically indicated additional testing with an alternate test  methodology [phone]) is advised. The SARS-CoV-2 RNA is generally  detectable in upper and lower respiratory sp                          ecimens during the acute  phase of infection. The expected result is Negative. Fact Sheet for Patients:  StrictlyIdeas.no Fact Sheet for Healthcare Providers: BankingDealers.co.za This test is not yet approved or cleared by the [place] FDA and has been authorized for detection and/or diagnosis of SARS-CoV-2  by FDA under an Emergency Use Authorization (EUA).  This EUA will remain in effect (meaning this test can be used) for the duration of the COVID-19 declaration under Section 564(b)(1) of the Act, 21 U.S.C. section 360bbb-3(b)(1), unless the authorization is terminated or revoked sooner. Performed at [facility], [address], [place], [place] [address]   . Specimen Description 03/01/2019    Final                    Value:URINE, RANDOM Performed at [facility], [address] [place], [place] [address]   . Special Requests 03/01/2019    Final                   Value:Normal Performed at [facility], [address] [place], [place] [address]   . Culture 03/01/2019 >=100,000 COLONIES/mL ESCHERICHIA COLI*  Final  . Report Status 03/01/2019 03/04/2019 FINAL   Final  . Organism ID, Bacteria 03/01/2019 ESCHERICHIA COLI*  Final    STUDIES: No results found.   ASSESSMENT: History of diffuse large B-cell lymphoma.  PLAN:    1. Diffuse Large B Cell Lymphoma: Patient was initially diagnosed in August 2015, pathology was noted to be anaplastic subtype.  He completed 6 cycles of R-CHOP chemotherapy in January 2016. PET scan January 2017 revealed no evidence of disease.  2.  Urothelial carcinoma: Confirmed by biopsy.  Patient's most recent CT scan on March 01, 2019 reviewed independently and reported as above with progressive left pelvic mass greater than 4 cm.  Patient appears to have no other evidence of disease.  Case discussed with urology.  Given  the fact that patient is symptomatic with pain and has localized disease, he may benefit from concurrent chemotherapy along with daily XRT.  If radiation therapy is not possible, will likely give immunotherapy with Tecentriq every 3 weeks.  A referral was given to radiation oncology for next week.  Medical oncology follow-up will be based on radiation oncology evaluation. 3.  UTI: Patient was given a prescription for Macrobid today. 4.  Hydronephrosis: Case discussed with urology.  No need for stent at this time. 5.  Renal insufficiency: Patient's creatinine is trending up and is now 2.08.  Continue to monitor closely.  6.  Pain: Patient was given prescriptions for fentanyl and oxycodone.  I spent a total of 30 minutes face-to-face with the patient of which greater than 50% of the visit was spent in counseling and coordination of care as detailed  above.   Patient expressed understanding and was in agreement with this plan. He also understands that He can call clinic at any time with any questions, concerns, or complaints.    Lloyd Huger, MD   03/11/2019 8:35 AM

## 2019-03-12 ENCOUNTER — Other Ambulatory Visit: Payer: Self-pay | Admitting: Radiology

## 2019-03-12 ENCOUNTER — Other Ambulatory Visit: Payer: Self-pay

## 2019-03-12 ENCOUNTER — Other Ambulatory Visit
Admission: RE | Admit: 2019-03-12 | Discharge: 2019-03-12 | Disposition: A | Discharge status: Home / Self Care | Payer: PPO | Source: Ambulatory Visit | Attending: Urology | Admitting: Urology

## 2019-03-12 ENCOUNTER — Telehealth: Payer: Self-pay

## 2019-03-12 ENCOUNTER — Encounter: Payer: Self-pay | Admitting: Urology

## 2019-03-12 ENCOUNTER — Other Ambulatory Visit: Payer: PPO

## 2019-03-12 DIAGNOSIS — Z1159 Encounter for screening for other viral diseases: Secondary | ICD-10-CM | POA: Diagnosis not present

## 2019-03-12 DIAGNOSIS — N390 Urinary tract infection, site not specified: Secondary | ICD-10-CM

## 2019-03-12 LAB — URINALYSIS, COMPLETE
Bilirubin, UA: NEGATIVE
Glucose, UA: NEGATIVE
Ketones, UA: NEGATIVE
Nitrite, UA: POSITIVE — AB
Specific Gravity, UA: 1.02 (ref 1.005–1.030)
Urobilinogen, Ur: 0.2 mg/dL (ref 0.2–1.0)
pH, UA: 5.5 (ref 5.0–7.5)

## 2019-03-12 LAB — MICROSCOPIC EXAMINATION: WBC, UA: >30 /hpf — AB (ref 0–5)

## 2019-03-12 MED ORDER — AMOXICILLIN-POT CLAVULANATE 875-125 MG PO TABS: 1.0000 | ORAL_TABLET | Freq: Two times a day (BID) | ORAL | 0 refills | Status: DC

## 2019-03-12 NOTE — Telephone Encounter (Signed)
Called pt informed him of antibiotic that was sent in. Patient states that he was started on an antibiotic (macrobid 100mg  x 7days, BID) by Dr. Grayland Ormond. Patient has started this medication already.

## 2019-03-12 NOTE — Telephone Encounter (Signed)
Per Dr.Sninsky call and as patient if he has fever, malaise, or LT sided back pain. Pt denies all of the above. Provider informed.

## 2019-03-13 ENCOUNTER — Other Ambulatory Visit: Payer: Self-pay | Admitting: Radiology

## 2019-03-13 DIAGNOSIS — N1330 Unspecified hydronephrosis: Secondary | ICD-10-CM

## 2019-03-13 LAB — SARS CORONAVIRUS 2 (TAT 6-24 HRS): SARS Coronavirus 2: NEGATIVE

## 2019-03-13 NOTE — Telephone Encounter (Signed)
Notified patient that he should start the Augmentin and stop taking macrobid due to his urine bacteria was resistant to the nitrofurantoin. Patient expresses understanding of instructions. Also LMOM of nephew, Richardson Landry, of the same.

## 2019-03-13 NOTE — Telephone Encounter (Signed)
He should start there Augmentin, his urine bacteria was resistant to the nitrofurantoin. Thanks  Nickolas Madrid, MD 03/13/2019

## 2019-03-14 ENCOUNTER — Other Ambulatory Visit: Payer: Self-pay

## 2019-03-14 ENCOUNTER — Ambulatory Visit: Payer: PPO

## 2019-03-14 ENCOUNTER — Encounter
Admission: RE | Admit: 2019-03-14 | Discharge: 2019-03-14 | Disposition: A | Discharge status: Home / Self Care | Payer: PPO | Source: Ambulatory Visit | Attending: Urology | Admitting: Urology

## 2019-03-14 ENCOUNTER — Other Ambulatory Visit: Payer: Self-pay | Admitting: *Deleted

## 2019-03-14 LAB — CULTURE, URINE COMPREHENSIVE

## 2019-03-14 NOTE — Patient Instructions (Signed)
Your procedure is scheduled on:03/16/19  Report to [place]. To find out your arrival time please call [phone] between 1PM - 3PM on 03/12/19 .  Remember: Instructions that are not followed completely may result in serious medical risk, up to and including death, or upon the discretion of your surgeon and anesthesiologist your surgery may need to be rescheduled.     _X__ 1. Do not eat food after midnight the night before your procedure.                 No gum chewing or hard candies. You may drink clear liquids up to 2 hours                 before you are scheduled to arrive for your surgery- DO not drink clear                 liquids within 2 hours of the start of your surgery.                 Clear Liquids include:  water, apple juice without pulp, clear carbohydrate                 drink such as Clearfast or Gatorade, Black Coffee or Tea (Do not add                 anything to coffee or tea). Diabetics water only  __X__2.  On the morning of surgery brush your teeth with toothpaste and water, you                 may rinse your mouth with mouthwash if you wish.  Do not swallow any              toothpaste of mouthwash.     _X__ 3.  No Alcohol for 24 hours before or after surgery.   _X__ 4.  Do Not Smoke or use e-cigarettes For 24 Hours Prior to Your Surgery.                 Do not use any chewable tobacco products for at least 6 hours prior to                 surgery.  ____  5.  Bring all medications with you on the day of surgery if instructed.   __X__  6.  Notify your doctor if there is any change in your medical condition      (cold, fever, infections).     Do not wear jewelry, make-up, hairpins, clips or nail polish. Do not wear lotions, powders, or perfumes.  Do not shave 48 hours prior to surgery. Men may shave face and neck. Do not bring valuables to the hospital.    [facility] is not responsible for any belongings  or valuables.  Contacts, dentures/partials or body piercings may not be worn into surgery. Bring a case for your contacts, glasses or hearing aids, a denture cup will be supplied. Leave your suitcase in the car. After surgery it may be brought to your room. For patients admitted to the hospital, discharge time is determined by your treatment team.   Patients discharged the day of surgery will not be allowed to drive home.   Please read over the following fact sheets that you were given:   MRSA Information  __X__ Take these medicines the morning of surgery with A SIP OF WATER:  1. atorvastatin (LIPITOR)  2. famotidine (PEPCID)  3. finasteride (PROSCAR)  4. metoprolol succinate (TOPROL-  5.  6.  ____ Fleet Enema (as directed)   __X__ Use CHG Soap/SAGE wipes as directed  ____ Use inhalers on the day of surgery  ____ Stop metformin/Janumet/Farxiga 2 days prior to surgery    ____ Take 1/2 of usual insulin dose the night before surgery. No insulin the morning          of surgery.   ____ Stop Blood Thinners Coumadin/Plavix/Xarelto/Pleta/Pradaxa/Eliquis/Effient/Aspirin  on   Or contact your Surgeon, Cardiologist or Medical Doctor regarding  ability to stop your blood thinners  __X__ Stop Anti-inflammatories 7 days before surgery such as Advil, Ibuprofen, Motrin,  BC or Goodies Powder, Naprosyn, Naproxen, Aleve, Aspirin    __X__ Stop all herbal supplements, fish oil or vitamin E until after surgery.    ____ Bring C-Pap to the hospital.      TELEPHONE INTERVIEW WITH PATIENT. INSTRUCTIONS REVIEWED. PATIENT VERBALIZED UNDERSTANDING.

## 2019-03-14 NOTE — Pre-Procedure Instructions (Signed)
fROM eD vISIT sAME dATE  EKG  ED ECG REPORT I, SUNG,JADE J, the attending physician, personally viewed and interpreted this ECG.   Date: 03/01/2019  EKG Time: 0158  Rate: 82  Rhythm: normal EKG, normal sinus rhythm  Axis: Normal  Intervals:none  ST&T Change: Nonspecific

## 2019-03-15 ENCOUNTER — Ambulatory Visit: Payer: PPO

## 2019-03-15 MED ORDER — SODIUM CHLORIDE 0.9 % IV SOLN
1.0000 g | INTRAVENOUS | Status: DC
  Filled 2019-03-15: qty 10

## 2019-03-15 NOTE — Progress Notes (Signed)
Virtual Visit via Telephone Note  I connected with Bryan Nelson on 03/09/19  at  4:00 PM EDT by telephone and verified that I am speaking with the correct person using two identifiers.  Location: Patient: home Provider: office   I discussed the limitations, risks, security and privacy concerns of performing an evaluation and management service by telephone and the availability of in person appointments. I also discussed with the patient that there may be a patient responsible charge related to this service. The patient expressed understanding and agreed to proceed.   History of Present Illness: 85 year old male well-known to the urological service with a history of urinary retention/incomplete bladder emptying managed on [facility], large bladder diverticulum with recently diagnosed invasive high-grade urothelial carcinoma with squamous differentiation arising presumably from his bladder diverticulum.  He is now status post biopsy and is being managed by Dr. Grayland Ormond at the cancer center.  He is also receiving radiation treatment to the pelvis for palliation of his left-sided abdominal pain.    He was recently discussed at tumor board and found to have new left-sided hydronephrosis with worsening renal function.  CT scan findings were reviewed as below.  Most recent creatinine up to 2.08 on 03/01/2019.  Previous baseline around 1.1-1.2.  He continues to complain today of left lower abdominal pain.  He is tolerating radiation fairly well.  He is chronically colonized with bacteria.  He is recently treated for UTI.  He continues to [facility].  CLINICAL DATA:  Progressive left-sided abdominal pain. Invasive high-grade urothelial carcinoma with squamous differentiation.  EXAM: CT ABDOMEN AND PELVIS WITHOUT CONTRAST  TECHNIQUE: Multidetector CT imaging of the abdomen and pelvis was performed following the standard protocol without IV contrast.  COMPARISON:  CT scan dated  02/01/2019  FINDINGS: Lower chest: Aortic atherosclerosis. Extensive coronary artery calcifications. Heart size is normal. Minimal atelectasis at the left lung base. No effusions.  Hepatobiliary: No focal liver abnormality is seen. No gallstones, gallbladder wall thickening, or biliary dilatation.  Pancreas: Unremarkable. No pancreatic ductal dilatation or surrounding inflammatory changes.  Spleen: Normal in size without focal abnormality.  Adrenals/Urinary Tract: There has been progression of the irregular mass in the left side of the pelvis which probably arises from the superior aspect of a left-sided bladder diverticulum. The mass now measures approximately 4.2 x 4.2 x 4.0 cm.  There is persistent left hydronephrosis due to distal ureteral obstruction by the mass. This is unchanged. There is slight soft tissue stranding from the mass to the left superolateral aspect of the dome of the bladder, probably representing tumor, new since the prior exam.  There is a new nodular extension of the mass anteriorly seen on image 73 of series 2, 16 mm in diameter. There is new soft tissue stranding extending to the sigmoid portion of the colon best seen on images 76 and 77 of series 2.  The mass also extends into the left psoas muscle on image 71 of series 2.  Stomach/Bowel: There are few diverticula in the sigmoid colon. Other than the tumor stranding toward the sigmoid colon as described above, the bowel appears normal. Appendix is normal.  Vascular/Lymphatic: Extensive aortic atherosclerosis.  Reproductive: Prostate is unremarkable.  Other: No free air or free fluid or abdominal wall hernia.  Musculoskeletal: No acute abnormalities. Multilevel degenerative disc disease. Old anterior wedge deformity of T12.  IMPRESSION: 1. There has been progression of the irregular mass in the left side of the pelvis which probably arises from the superior aspect of  a left-sided bladder diverticulum. The mass now measures approximately 4.2 x 4.2 x 4.0 cm. The mass now has soft tissue stranding to the left psoas muscle, sigmoid portion of the colon, and the dome of the bladder. 2. Persistent left hydronephrosis due to distal ureteral obstruction by the mass. 3. Aortic atherosclerosis and coronary artery calcifications. 4. Sigmoid diverticulosis without diverticulitis. 5. Old anterior wedge deformity of T12.  Aortic Atherosclerosis (ICD10-I70.0).   Electronically Signed   By: Lorriane Shire M.D.   On: 03/01/2019 07:30  Observations/Objective: Conversive, pleasant on telephone today  Assessment and Plan:  1. Bladder diverticulum Secondary to chronic outlet obstruction Likely source of bladder cancer malignancy  2. Bladder cancer metastasized to pelvic region [facility]) Being managed by the cancer center, Dr. Grayland Ormond with chemotherapy and Dr. Donella Stade for palliative radiation  3. Hydronephrosis, left Fairly new onset left hydroureteronephrosis secondary to obstructing left hemipelvic mass In addition to the above, his creatinine is rising Given that he is receiving nephrotoxic drugs, I have strongly recommended that he consider pursuing cystoscopy, left ureteral stent placement for urinary decompression and optimization of his renal function We discussed stent placement at length today including stent discomfort, hematuria, infections, damage to surrounding structures, need for stent exchanges amongst others.  All questions answered.  Given he is chronically colonized with bacteria, will likely treat anticipation of stent placement to reduce his risk of infectious complications.  We discussed need for COVID testing prior to the procedure and he is in agreement.  All questions answered.  Case was discussed with my partner, Dr. Diamantina Providence who is available to place a stent next Friday.  4. Urinary retention Managed with [facility]   I discussed the  assessment and treatment plan with the patient. The patient was provided an opportunity to ask questions and all were answered. The patient agreed with the plan and demonstrated an understanding of the instructions.   The patient was advised to call back or seek an in-person evaluation if the symptoms worsen or if the condition fails to improve as anticipated.  I provided 21 minutes of non-face-to-face time during this encounter.   Hollice Espy, MD

## 2019-03-16 ENCOUNTER — Ambulatory Visit: Payer: PPO

## 2019-03-16 ENCOUNTER — Inpatient Hospital Stay: Payer: PPO

## 2019-03-16 ENCOUNTER — Other Ambulatory Visit: Payer: Self-pay

## 2019-03-16 ENCOUNTER — Encounter: Payer: Self-pay | Admitting: Emergency Medicine

## 2019-03-16 ENCOUNTER — Ambulatory Visit: Admission: RE | Admit: 2019-03-16 | Payer: PPO | Source: Home / Self Care | Admitting: Urology

## 2019-03-16 ENCOUNTER — Inpatient Hospital Stay
Admission: EM | Admit: 2019-03-16 | Discharge: 2019-03-17 | DRG: 908 | Disposition: A | Discharge status: Home / Self Care | Payer: PPO | Attending: Internal Medicine | Admitting: Internal Medicine

## 2019-03-16 ENCOUNTER — Inpatient Hospital Stay: Payer: PPO | Admitting: Oncology

## 2019-03-16 DIAGNOSIS — Z882 Allergy status to sulfonamides status: Secondary | ICD-10-CM

## 2019-03-16 DIAGNOSIS — N323 Diverticulum of bladder: Secondary | ICD-10-CM | POA: Diagnosis not present

## 2019-03-16 DIAGNOSIS — Z87440 Personal history of urinary (tract) infections: Secondary | ICD-10-CM

## 2019-03-16 DIAGNOSIS — Z87891 Personal history of nicotine dependence: Secondary | ICD-10-CM | POA: Diagnosis not present

## 2019-03-16 DIAGNOSIS — I2510 Atherosclerotic heart disease of native coronary artery without angina pectoris: Secondary | ICD-10-CM | POA: Diagnosis not present

## 2019-03-16 DIAGNOSIS — Z86010 Personal history of colonic polyps: Secondary | ICD-10-CM

## 2019-03-16 DIAGNOSIS — T361X5A Adverse effect of cephalosporins and other beta-lactam antibiotics, initial encounter: Secondary | ICD-10-CM | POA: Diagnosis not present

## 2019-03-16 DIAGNOSIS — N136 Pyonephrosis: Secondary | ICD-10-CM | POA: Diagnosis not present

## 2019-03-16 DIAGNOSIS — Z1629 Resistance to other single specified antibiotic: Secondary | ICD-10-CM | POA: Diagnosis not present

## 2019-03-16 DIAGNOSIS — Z9079 Acquired absence of other genital organ(s): Secondary | ICD-10-CM

## 2019-03-16 DIAGNOSIS — K219 Gastro-esophageal reflux disease without esophagitis: Secondary | ICD-10-CM | POA: Diagnosis present

## 2019-03-16 DIAGNOSIS — Z8262 Family history of osteoporosis: Secondary | ICD-10-CM

## 2019-03-16 DIAGNOSIS — T502X5A Adverse effect of carbonic-anhydrase inhibitors, benzothiadiazides and other diuretics, initial encounter: Secondary | ICD-10-CM | POA: Diagnosis not present

## 2019-03-16 DIAGNOSIS — Z955 Presence of coronary angioplasty implant and graft: Secondary | ICD-10-CM

## 2019-03-16 DIAGNOSIS — C7989 Secondary malignant neoplasm of other specified sites: Secondary | ICD-10-CM | POA: Diagnosis not present

## 2019-03-16 DIAGNOSIS — T783XXA Angioneurotic edema, initial encounter: Principal | ICD-10-CM | POA: Diagnosis present

## 2019-03-16 DIAGNOSIS — Z923 Personal history of irradiation: Secondary | ICD-10-CM

## 2019-03-16 DIAGNOSIS — N132 Hydronephrosis with renal and ureteral calculous obstruction: Secondary | ICD-10-CM | POA: Diagnosis not present

## 2019-03-16 DIAGNOSIS — E785 Hyperlipidemia, unspecified: Secondary | ICD-10-CM | POA: Diagnosis not present

## 2019-03-16 DIAGNOSIS — N390 Urinary tract infection, site not specified: Secondary | ICD-10-CM

## 2019-03-16 DIAGNOSIS — B9620 Unspecified Escherichia coli [E. coli] as the cause of diseases classified elsewhere: Secondary | ICD-10-CM | POA: Diagnosis present

## 2019-03-16 DIAGNOSIS — H9190 Unspecified hearing loss, unspecified ear: Secondary | ICD-10-CM | POA: Diagnosis not present

## 2019-03-16 DIAGNOSIS — Z888 Allergy status to other drugs, medicaments and biological substances status: Secondary | ICD-10-CM | POA: Diagnosis not present

## 2019-03-16 DIAGNOSIS — Z974 Presence of external hearing-aid: Secondary | ICD-10-CM

## 2019-03-16 DIAGNOSIS — I10 Essential (primary) hypertension: Secondary | ICD-10-CM | POA: Diagnosis not present

## 2019-03-16 DIAGNOSIS — N1330 Unspecified hydronephrosis: Secondary | ICD-10-CM | POA: Diagnosis not present

## 2019-03-16 DIAGNOSIS — Z8719 Personal history of other diseases of the digestive system: Secondary | ICD-10-CM

## 2019-03-16 DIAGNOSIS — C679 Malignant neoplasm of bladder, unspecified: Secondary | ICD-10-CM | POA: Diagnosis present

## 2019-03-16 DIAGNOSIS — Z8619 Personal history of other infectious and parasitic diseases: Secondary | ICD-10-CM

## 2019-03-16 DIAGNOSIS — Z880 Allergy status to penicillin: Secondary | ICD-10-CM | POA: Diagnosis not present

## 2019-03-16 DIAGNOSIS — Z820 Family history of epilepsy and other diseases of the nervous system: Secondary | ICD-10-CM

## 2019-03-16 DIAGNOSIS — T360X5A Adverse effect of penicillins, initial encounter: Secondary | ICD-10-CM | POA: Diagnosis not present

## 2019-03-16 DIAGNOSIS — Z881 Allergy status to other antibiotic agents status: Secondary | ICD-10-CM | POA: Diagnosis not present

## 2019-03-16 DIAGNOSIS — C8590 Non-Hodgkin lymphoma, unspecified, unspecified site: Secondary | ICD-10-CM | POA: Diagnosis not present

## 2019-03-16 LAB — CBC WITH DIFFERENTIAL/PLATELET
Abs Immature Granulocytes: 0.04 10*3/uL (ref 0.00–0.07)
Basophils Absolute: 0 10*3/uL (ref 0.0–0.1)
Basophils Relative: 0 %
Eosinophils Absolute: 0.3 10*3/uL (ref 0.0–0.5)
Eosinophils Relative: 3 %
HCT: 35.6 % — ABNORMAL LOW (ref 39.0–52.0)
Hemoglobin: 11.6 g/dL — ABNORMAL LOW (ref 13.0–17.0)
Immature Granulocytes: 0 %
Lymphocytes Relative: 12 %
Lymphs Abs: 1.2 10*3/uL (ref 0.7–4.0)
MCH: 28.1 pg (ref 26.0–34.0)
MCHC: 32.6 g/dL (ref 30.0–36.0)
MCV: 86.2 fL (ref 80.0–100.0)
Monocytes Absolute: 1 10*3/uL (ref 0.1–1.0)
Monocytes Relative: 10 %
Neutro Abs: 7.4 10*3/uL (ref 1.7–7.7)
Neutrophils Relative %: 75 %
Platelets: 368 10*3/uL (ref 150–400)
RBC: 4.13 MIL/uL — ABNORMAL LOW (ref 4.22–5.81)
RDW: 14.6 % (ref 11.5–15.5)
WBC: 10 10*3/uL (ref 4.0–10.5)
nRBC: 0 % (ref 0.0–0.2)

## 2019-03-16 LAB — BASIC METABOLIC PANEL
Anion gap: 11 (ref 5–15)
BUN: 38 mg/dL — ABNORMAL HIGH (ref 8–23)
CO2: 22 mmol/L (ref 22–32)
Calcium: 8.9 mg/dL (ref 8.9–10.3)
Chloride: 103 mmol/L (ref 98–111)
Creatinine, Ser: 2.04 mg/dL — ABNORMAL HIGH (ref 0.61–1.24)
GFR calc Af Amer: 33 mL/min — ABNORMAL LOW (ref >60)
GFR calc non Af Amer: 29 mL/min — ABNORMAL LOW (ref >60)
Glucose, Bld: 116 mg/dL — ABNORMAL HIGH (ref 70–99)
Potassium: 4.2 mmol/L (ref 3.5–5.1)
Sodium: 136 mmol/L (ref 135–145)

## 2019-03-16 SURGERY — CYSTOSCOPY, WITH STENT INSERTION
Anesthesia: Choice | Laterality: Left

## 2019-03-16 MED ORDER — DIPHENHYDRAMINE HCL 50 MG/ML IJ SOLN
25.0000 mg | Freq: Once | INTRAMUSCULAR | Status: AC
  Administered 2019-03-16: 25 mg via INTRAVENOUS
  Filled 2019-03-16: qty 1

## 2019-03-16 MED ORDER — CLOPIDOGREL BISULFATE 75 MG PO TABS
75.0000 mg | ORAL_TABLET | Freq: Every day | ORAL | Status: DC
  Administered 2019-03-17: 75 mg via ORAL
  Filled 2019-03-16: qty 1

## 2019-03-16 MED ORDER — FAMOTIDINE 20 MG PO TABS
20.0000 mg | ORAL_TABLET | Freq: Once | ORAL | Status: DC
  Filled 2019-03-16: qty 1

## 2019-03-16 MED ORDER — ADULT MULTIVITAMIN W/MINERALS CH
1.0000 | ORAL_TABLET | Freq: Every day | ORAL | Status: DC
  Filled 2019-03-16: qty 1

## 2019-03-16 MED ORDER — FAMOTIDINE IN NACL 20-0.9 MG/50ML-% IV SOLN
20.0000 mg | Freq: Once | INTRAVENOUS | Status: AC
  Administered 2019-03-16: 08:00:00 20 mg via INTRAVENOUS
  Filled 2019-03-16 (×2): qty 50

## 2019-03-16 MED ORDER — MAGNESIUM OXIDE 400 (241.3 MG) MG PO TABS
200.0000 mg | ORAL_TABLET | Freq: Every day | ORAL | Status: DC
  Administered 2019-03-17: 200 mg via ORAL
  Filled 2019-03-16: qty 1

## 2019-03-16 MED ORDER — AZTREONAM 2 G IJ SOLR: 2.0000 g | Freq: Three times a day (TID) | INTRAMUSCULAR | Status: DC

## 2019-03-16 MED ORDER — ENOXAPARIN SODIUM 40 MG/0.4ML ~~LOC~~ SOLN
40.0000 mg | SUBCUTANEOUS | Status: DC
  Administered 2019-03-16: 40 mg via SUBCUTANEOUS
  Filled 2019-03-16: qty 0.4

## 2019-03-16 MED ORDER — ISOSORBIDE DINITRATE 30 MG PO TABS
30.0000 mg | ORAL_TABLET | Freq: Every day | ORAL | Status: DC
  Administered 2019-03-16 – 2019-03-17 (×2): 30 mg via ORAL
  Filled 2019-03-16 (×2): qty 1

## 2019-03-16 MED ORDER — PSYLLIUM 95 % PO PACK
1.0000 | PACK | Freq: Every day | ORAL | Status: DC
  Administered 2019-03-16: 16:00:00 1 via ORAL
  Filled 2019-03-16 (×2): qty 1

## 2019-03-16 MED ORDER — METOPROLOL SUCCINATE ER 25 MG PO TB24
25.0000 mg | ORAL_TABLET | Freq: Every day | ORAL | Status: DC
  Administered 2019-03-17: 12:00:00 25 mg via ORAL
  Filled 2019-03-16: qty 1

## 2019-03-16 MED ORDER — SODIUM CHLORIDE 0.9 % IV SOLN
2.0000 g | Freq: Three times a day (TID) | INTRAVENOUS | Status: DC
  Administered 2019-03-16 – 2019-03-17 (×4): 2 g via INTRAVENOUS
  Filled 2019-03-16 (×7): qty 2

## 2019-03-16 MED ORDER — DEXAMETHASONE SODIUM PHOSPHATE 10 MG/ML IJ SOLN
10.0000 mg | Freq: Once | INTRAMUSCULAR | Status: AC
  Administered 2019-03-16: 08:00:00 10 mg via INTRAVENOUS
  Filled 2019-03-16: qty 1

## 2019-03-16 MED ORDER — VITAMIN D3 25 MCG (1000 UNIT) PO TABS
1000.0000 [IU] | ORAL_TABLET | Freq: Every day | ORAL | Status: DC
  Administered 2019-03-17: 1000 [IU] via ORAL
  Filled 2019-03-16 (×2): qty 1

## 2019-03-16 MED ORDER — OCUVITE-LUTEIN PO CAPS
2.0000 | ORAL_CAPSULE | Freq: Every day | ORAL | Status: DC
  Administered 2019-03-17: 16:00:00 2 via ORAL
  Filled 2019-03-16: qty 2

## 2019-03-16 MED ORDER — SODIUM CHLORIDE 0.9 % IV SOLN
INTRAVENOUS | Status: DC
  Administered 2019-03-16: 15:00:00 via INTRAVENOUS

## 2019-03-16 MED ORDER — EPINEPHRINE 0.3 MG/0.3ML IJ SOAJ
0.3000 mg | Freq: Once | INTRAMUSCULAR | Status: AC
  Administered 2019-03-16: 0.3 mg via INTRAMUSCULAR
  Filled 2019-03-16: qty 0.3

## 2019-03-16 MED ORDER — AZTREONAM 2 G IJ SOLR
2.0000 g | Freq: Once | INTRAMUSCULAR | Status: DC
  Filled 2019-03-16: qty 2

## 2019-03-16 MED ORDER — FAMOTIDINE 20 MG PO TABS
20.0000 mg | ORAL_TABLET | Freq: Two times a day (BID) | ORAL | Status: DC
  Administered 2019-03-16 – 2019-03-17 (×2): 20 mg via ORAL
  Filled 2019-03-16 (×2): qty 1

## 2019-03-16 MED ORDER — FINASTERIDE 5 MG PO TABS
5.0000 mg | ORAL_TABLET | Freq: Every day | ORAL | Status: DC
  Administered 2019-03-17: 5 mg via ORAL
  Filled 2019-03-16: qty 1

## 2019-03-16 MED ORDER — GABAPENTIN 300 MG PO CAPS
300.0000 mg | ORAL_CAPSULE | Freq: Every day | ORAL | Status: DC
  Filled 2019-03-16: qty 1

## 2019-03-16 NOTE — ED Notes (Signed)
Pt given apple sauce at this time. Per MD Jari Pigg, pt only to have soft foods due to tongue swelling. Pt asked if o2 sensor could be removed while pt ate. MD aware and okay with removing o2 sensor while pt eats.

## 2019-03-16 NOTE — ED Notes (Signed)
Pt coming to nurses desk to ask about bed. This RN informed pt and son that we would only know pt's room assignment when bed board assigns the pt a room. No further questions at this time.

## 2019-03-16 NOTE — ED Notes (Signed)
.. ED TO INPATIENT HANDOFF REPORT  ED Nurse Name and Phone #: Deneise Lever [phone]  S Name/Age/Gender Bryan Nelson 85 y.o. male Room/Bed: ED08A/ED08A  Code Status   Code Status: Full Code  Home/SNF/Other Home Patient oriented to: self, place, time and situation Is this baseline? Yes   Triage Complete: Triage complete  Chief Complaint swollen tongue  Triage Note Pt states that he woke up at 3am with a swollen tongue. St. Never happened before, and that his tongue is the only thing that's swollen. Pt is currently in NAD, able to breath normally. O2 on arrival 100%. Pt is able to swallow normally. Pt started a new pre-op medication recently (augmenten) and has been on Lisinopril for 10 years    Allergies Allergies  Allergen Reactions  . Augmentin [Amoxicillin-Pot Clavulanate]     Tongue swelling 2 days after taking  . Ciprofloxacin Diarrhea  . Lisinopril Other (See Comments)    Unknown   . Penicillins Swelling    unknwon reaction.  tolerates amoxicillin Has patient had a PCN reaction causing immediate rash, facial/tongue/throat swelling, SOB or lightheadedness with hypotension: No Has patient had a PCN reaction causing severe rash involving mucus membranes or skin necrosis: No Has patient had a PCN reaction that required hospitalization: No Has patient had a PCN reaction occurring within the last 10 years: Yes If all of the above answers are "NO", then may proceed with Cephalosporin use.   . Bactrim [Sulfamethoxazole-Trimethoprim] Rash  . Sulfa Antibiotics Itching and Rash    Level of Care/Admitting Diagnosis ED Disposition    ED Disposition Condition [facility] Area: [place] [[id number]]  Level of Care: Med-Surg [16]  Covid Evaluation: Confirmed COVID Negative  Diagnosis: Angioedema [[id number]]  Admitting Physician: Lang Snow [[id number]]  Attending Physician: Rufina Falco ACHIENG [[id number]]  Estimated length of stay: past  midnight tomorrow  Certification:: I certify this patient will need inpatient services for at least 2 midnights  PT Class (Do Not Modify): Inpatient [101]  PT Acc Code (Do Not Modify): Private [1]       B Medical/Surgery History Past Medical History:  Diagnosis Date  . Arteriosclerosis of coronary artery 08/26/2011   Overview:      a.  1999 PCI of the mid LAD with stent.      b.  2002 [facility]: EF 56%. RCA-distal 25/25%. Left main-50% ostial.  Left circumflex-25% OM2.  LAD-25% proximal.  75% mid.  25/25% distal. 75% D1.      c.  07/2011 PCI of RCA with DES.[place]   . Bleeding gastrointestinal 08/26/2011   Overview:      a.  Chronic abdominal pain, present improving.      b.  Duodenitis and gastritis by EGD in 2000.      c.  Pylori in 2000, treated.      d.  Gastroesophageal reflux disease.      e.  Diverticular disease.    . BP (high blood pressure) 08/26/2011  . Closed dislocation of right ankle 09/10/2016   Successfully reduced in ER 09/10/2016  . Diffuse large B-cell lymphoma ([place]) 2015   chemo tx's  . GERD (gastroesophageal reflux disease)   . Heart disease   . Helicobacter pylori infection 06/18/2015   by EGD RX 08/18/1999   . History of adenomatous polyp of colon 06/18/2015  . HOH (hard of hearing)    Bilateral Hearing  Aids  . Malleolar fracture (Right) 09/10/2016   Past Surgical History:  Procedure Laterality Date  . ANGIOPLASTY    . CATARACT EXTRACTION    . COLONOSCOPY  2016  . CORONARY ANGIOPLASTY    . EYE SURGERY Bilateral    Cataract Extraction with IOL  . GREEN LIGHT LASER TURP (TRANSURETHRAL RESECTION OF PROSTATE N/A 04/16/2015   Procedure: GREEN LIGHT LASER TURP (TRANSURETHRAL RESECTION OF PROSTATE WITH BLADDER BIOPSY;  Surgeon: Collier Flowers, MD;  Location: [facility] ORS;  Service: Urology;  Laterality: N/A;  . heart stent placement     1998, 2000, 2012  . TRANSURETHRAL RESECTION OF PROSTATE N/A 03/02/2017   Procedure: TRANSURETHRAL RESECTION OF THE PROSTATE (TURP);   Surgeon: Hollice Espy, MD;  Location: [facility] ORS;  Service: Urology;  Laterality: N/A;     A IV Location/Drains/Wounds Patient Lines/Drains/Airways Status   Active Line/Drains/Airways    Name:   Placement date:   Placement time:   Site:   Days:   Implanted Port 06/18/16 Left Chest   06/18/16    1326    Chest   1001   Peripheral IV 03/16/19 Left Antecubital   03/16/19    0741    Antecubital   less than 1   Incision (Closed) 03/02/17 Penis Other (Comment)   03/02/17    1250     744          Intake/Output Last 24 hours  Intake/Output Summary (Last 24 hours) at 03/16/2019 1307 Last data filed at 03/16/2019 1761 Gross per 24 hour  Intake 100 ml  Output -  Net 100 ml    Labs/Imaging Results for orders placed or performed during the hospital encounter of 03/16/19 (from the past 48 hour(s))  CBC with Differential     Status: Abnormal   Collection Time: 03/16/19  7:42 AM  Result Value Ref Range   WBC 10.0 4.0 - 10.5 K/uL   RBC 4.13 (L) 4.22 - 5.81 MIL/uL   Hemoglobin 11.6 (L) 13.0 - 17.0 g/dL   HCT 35.6 (L) 39.0 - 52.0 %   MCV 86.2 80.0 - 100.0 fL   MCH 28.1 26.0 - 34.0 pg   MCHC 32.6 30.0 - 36.0 g/dL   RDW 14.6 11.5 - 15.5 %   Platelets 368 150 - 400 K/uL   nRBC 0.0 0.0 - 0.2 %   Neutrophils Relative % 75 %   Neutro Abs 7.4 1.7 - 7.7 K/uL   Lymphocytes Relative 12 %   Lymphs Abs 1.2 0.7 - 4.0 K/uL   Monocytes Relative 10 %   Monocytes Absolute 1.0 0.1 - 1.0 K/uL   Eosinophils Relative 3 %   Eosinophils Absolute 0.3 0.0 - 0.5 K/uL   Basophils Relative 0 %   Basophils Absolute 0.0 0.0 - 0.1 K/uL   Immature Granulocytes 0 %   Abs Immature Granulocytes 0.04 0.00 - 0.07 K/uL    Comment: Performed at [facility], [address] [place], [place] [address]  Basic metabolic panel     Status: Abnormal   Collection Time: 03/16/19  7:42 AM  Result Value Ref Range   Sodium 136 135 - 145 mmol/L   Potassium 4.2 3.5 - 5.1 mmol/L   Chloride 103 98 - 111 mmol/L   CO2 22  22 - 32 mmol/L   Glucose, Bld 116 (H) 70 - 99 mg/dL   BUN 38 (H) 8 - 23 mg/dL   Creatinine, Ser 2.04 (H) 0.61 - 1.24 mg/dL   Calcium 8.9 8.9 - 10.3 mg/dL   GFR calc non Af Amer 29 (L) >  60 mL/min   GFR calc Af Amer 33 (L) >60 mL/min   Anion gap 11 5 - 15    Comment: Performed at [facility], [address] [place], [place] [address]   No results found.  Pending Labs Unresulted Labs (From admission, onward)   None      Vitals/Pain Today's Vitals   03/16/19 0732 03/16/19 0800 03/16/19 0830 03/16/19 1000  BP:  140/70 140/81 138/69  Pulse:  (!) 116  86  Resp:  19 20 15   Temp:      TempSrc:      SpO2:  99%  98%  Weight: 113 kg     Height: 6\' 1"  (1.854 m)     PainSc: 0-No pain       Isolation Precautions No active isolations  Medications Medications  isosorbide dinitrate (ISORDIL) tablet 30 mg (has no administration in time range)  metoprolol succinate (TOPROL-XL) 24 hr tablet 25 mg (has no administration in time range)  famotidine (PEPCID) tablet 20 mg (has no administration in time range)  psyllium (HYDROCIL/METAMUCIL) packet 1 packet (has no administration in time range)  finasteride (PROSCAR) tablet 5 mg (has no administration in time range)  clopidogrel (PLAVIX) tablet 75 mg (has no administration in time range)  gabapentin (NEURONTIN) capsule 300 mg (has no administration in time range)  Multivitamin Adult TABS (has no administration in time range)  Vitamin D3 CAPS 1,000 Units (has no administration in time range)  Magnesium TABS 250 mg (has no administration in time range)  Vision Formula 2 CAPS (has no administration in time range)  enoxaparin (LOVENOX) injection 40 mg (has no administration in time range)  0.9 %  sodium chloride infusion (has no administration in time range)  diphenhydrAMINE (BENADRYL) injection 25 mg (25 mg Intravenous Given 03/16/19 0751)  EPINEPHrine (EPI-PEN) injection 0.3 mg (0.3 mg Intramuscular Given 03/16/19 0748)  dexamethasone  (DECADRON) injection 10 mg (10 mg Intravenous Given 03/16/19 0751)  famotidine (PEPCID) IVPB 20 mg premix (0 mg Intravenous Stopped 03/16/19 0826)    Mobility walks Low fall risk   Focused Assessments Allergic reaction   R Recommendations: See Admitting Provider Note  Report given to:   Additional Notes:

## 2019-03-16 NOTE — Consult Note (Signed)
Pharmacy Antibiotic Note  Bryan Nelson is a 85 y.o. male admitted on 03/16/2019 with UTI.  Pharmacy has been consulted for aztreonam dosing. Pt has suspected allergy to Augmentin. Pt presented to the ED with oral lingual angiodema. Pt is on a ACEi PTA, but per Benjamine Mola patient has been on it for more than 20 years. May not be ACEi related. Team thinks it may be due to the recently started augment. Patient has received cephalosporins in the past, but we will continue with the aztreonam for now.    Plan:  Will start aztreonam 2 g q8H.   Height: 6\' 1"  (185.4 cm) Weight: 249 lb 1.9 oz (113 kg) IBW/kg (Calculated) : 79.9  Temp (24hrs), Avg:98.7 F (37.1 C), Min:98.7 F (37.1 C), Max:98.7 F (37.1 C)  Recent Labs  Lab 03/16/19 0742  WBC 10.0  CREATININE 2.04*    Estimated Creatinine Clearance: 34.9 mL/min (A) (by C-G formula based on SCr of 2.04 mg/dL (H)).    Allergies  Allergen Reactions  . Augmentin [Amoxicillin-Pot Clavulanate]     Tongue swelling 2 days after taking  . Ciprofloxacin Diarrhea  . Lisinopril Other (See Comments)    Unknown   . Penicillins Swelling    unknwon reaction.  tolerates amoxicillin Has patient had a PCN reaction causing immediate rash, facial/tongue/throat swelling, SOB or lightheadedness with hypotension: No Has patient had a PCN reaction causing severe rash involving mucus membranes or skin necrosis: No Has patient had a PCN reaction that required hospitalization: No Has patient had a PCN reaction occurring within the last 10 years: Yes If all of the above answers are "NO", then may proceed with Cephalosporin use.   . Bactrim [Sulfamethoxazole-Trimethoprim] Rash  . Sulfa Antibiotics Itching and Rash    Antimicrobials this admission: 7/24 aztreonam >>   Dose adjustments this admission: None  Microbiology results: 7/20 UCx: E.coli  Resistant to amp, ancef, cipro, levo, nitrofurantoin, TMP/SMX. Susceptible to ceftriaxone, Augmentin, ertapenem,  gentamicin, pip/tazo   Thank you for allowing pharmacy to be a part of this patient's care.  Oswald Hillock, PharmD, BCPS  03/16/2019 2:13 PM

## 2019-03-16 NOTE — Consult Note (Signed)
03/16/19 5:53 PM   Bryan Nelson 13-Aug-1932 [id number]   HPI: I was asked to see Bryan Nelson in consultation from Rufina Falco, NP for left-sided hydronephrosis.  He is an 85 year old male with metastatic bladder cancer and left-sided hydronephrosis with chronic UTIs.  He is managed as an outpatient by Dr. Erlene Quan.  He was on my outpatient OR schedule today for a left ureteral stent placement, however he presented to the hospital this morning with severe edema including tongue swelling and was sent to the emergency department.  He underwent work-up there and this was thought to be secondary to starting amoxicillin recently.  He denies any chest pain, shortness of breath, fevers, or chills this afternoon.  He was admitted for IV antibiotics in the setting of his multi resistant E. coli urine culture.   PMH: Past Medical History:  Diagnosis Date  . Arteriosclerosis of coronary artery 08/26/2011   Overview:      a.  1999 PCI of the mid LAD with stent.      b.  2002 Cath [place]: EF 56%. RCA-distal 25/25%. Left main-50% ostial.  Left circumflex-25% OM2.  LAD-25% proximal.  75% mid.  25/25% distal. 75% D1.      c.  07/2011 PCI of RCA with DES.[place]   . Bleeding gastrointestinal 08/26/2011   Overview:      a.  Chronic abdominal pain, present improving.      b.  Duodenitis and gastritis by EGD in 2000.      c.  Pylori in 2000, treated.      d.  Gastroesophageal reflux disease.      e.  Diverticular disease.    . BP (high blood pressure) 08/26/2011  . Closed dislocation of right ankle 09/10/2016   Successfully reduced in ER 09/10/2016  . Diffuse large B-cell lymphoma ([place]) 2015   chemo tx's  . GERD (gastroesophageal reflux disease)   . Heart disease   . Helicobacter pylori infection 06/18/2015   by EGD RX 08/18/1999   . History of adenomatous polyp of colon 06/18/2015  . HOH (hard of hearing)    Bilateral Hearing  Aids  . Malleolar fracture (Right) 09/10/2016    Surgical History: Past  Surgical History:  Procedure Laterality Date  . ANGIOPLASTY    . CATARACT EXTRACTION    . COLONOSCOPY  2016  . CORONARY ANGIOPLASTY    . EYE SURGERY Bilateral    Cataract Extraction with IOL  . GREEN LIGHT LASER TURP (TRANSURETHRAL RESECTION OF PROSTATE N/A 04/16/2015   Procedure: GREEN LIGHT LASER TURP (TRANSURETHRAL RESECTION OF PROSTATE WITH BLADDER BIOPSY;  Surgeon: Collier Flowers, MD;  Location: [facility] ORS;  Service: Urology;  Laterality: N/A;  . heart stent placement     1998, 2000, 2012  . TRANSURETHRAL RESECTION OF PROSTATE N/A 03/02/2017   Procedure: TRANSURETHRAL RESECTION OF THE PROSTATE (TURP);  Surgeon: Hollice Espy, MD;  Location: [facility] ORS;  Service: Urology;  Laterality: N/A;    Allergies:  Allergies  Allergen Reactions  . Augmentin [Amoxicillin-Pot Clavulanate]     Tongue swelling 2 days after taking  . Ciprofloxacin Diarrhea  . Lisinopril Other (See Comments)    Unknown   . Penicillins Swelling    unknwon reaction.  tolerates amoxicillin Has patient had a PCN reaction causing immediate rash, facial/tongue/throat swelling, SOB or lightheadedness with hypotension: No Has patient had a PCN reaction causing severe rash involving mucus membranes or skin necrosis: No Has patient had a PCN reaction that required hospitalization: No  Has patient had a PCN reaction occurring within the last 10 years: Yes If all of the above answers are "NO", then may proceed with Cephalosporin use.   . Bactrim [Sulfamethoxazole-Trimethoprim] Rash  . Sulfa Antibiotics Itching and Rash    Family History: Family History  Problem Relation Age of Onset  . Osteoporosis Mother   . Alzheimer's disease Father   . Kidney disease Neg Hx   . Prostate cancer Neg Hx   . Bladder Cancer Neg Hx   . Kidney cancer Neg Hx     Social History:  reports that he quit smoking about 47 years ago. His smoking use included cigarettes. He has a 18.00 pack-year smoking history. He has never used smokeless  tobacco. He reports that he does not drink alcohol or use drugs.  ROS: Please see flowsheet from today's date for complete review of systems.  Physical Exam: BP (!) 160/87 (BP Location: Left Arm)   Pulse (!) 101   Temp 97.8 F (36.6 C) (Oral)   Resp 16   Ht 6\' 1"  (1.854 m)   Wt 113 kg   SpO2 100%   BMI 32.87 kg/m    Constitutional:  Alert and oriented, No acute distress. Cardiovascular: No clubbing, cyanosis, or edema. Respiratory: Normal respiratory effort, no increased work of breathing. GI: Abdomen is soft, nontender, nondistended, no abdominal masses GU: No CVA tenderness Lymph: No cervical or inguinal lymphadenopathy. Skin: No rashes, bruises or suspicious lesions. Neurologic: Grossly intact, no focal deficits, moving all 4 extremities. Psychiatric: Normal mood and affect.  Laboratory Data: Reviewed  Pertinent Imaging: Reviewed, left-sided hydroureteronephrosis down to a left pelvic mass consistent with metastatic bladder cancer  Assessment & Plan:   In summary, the patient is an 85 year old male with metastatic bladder cancer and left-sided hydroureteronephrosis down to a mass in the pelvis consistent with metastatic bladder cancer.  He also has a recent resistant E coli UTI.  He is non-toxic appearing this evening.  Plan for left ureteral stent placement tomorrow with Dr. Junious Silk N.p.o. at midnight Continue antibiotics per hospitalist team Keep scheduled follow-up with Dr. Baltazar Najjar, MD  [facility] [address] [place], [place] [address] [phone]

## 2019-03-16 NOTE — ED Notes (Signed)
Pt assisted to bedside commode. Peri-care performed independently. Pt assisted back to bed. No further questions at this time.

## 2019-03-16 NOTE — ED Triage Notes (Signed)
Pt states that he woke up at 3am with a swollen tongue. St. Never happened before, and that his tongue is the only thing that's swollen. Pt is currently in NAD, able to breath normally. O2 on arrival 100%. Pt is able to swallow normally. Pt started a new pre-op medication recently (augmenten) and has been on Lisinopril for 10 years

## 2019-03-16 NOTE — H&P (Signed)
[facility] NAME: Bryan Nelson    MR#:  [id number]  DATE OF BIRTH:  June 06, 1932  DATE OF ADMISSION:  03/16/2019  PRIMARY CARE PHYSICIAN: [name], MD   REQUESTING/REFERRING PHYSICIAN: Annamarie Major, MD  CHIEF COMPLAINT:   Chief Complaint  Patient presents with   Allergic Reaction    HISTORY OF PRESENT ILLNESS:   85 year old male with past medical history of urinary retention on CIC, bladder diverticulum, recent diagnosis of bladder cancer receiving radiation treatment to the pelvis for palliation of his left-sided abdominal pain, left-sided hydronephrosis with worsening renal function, GI bleed and CAD presenting to the ED with oral lingual angioedema.  Patient states that he woke up this morning at 3 am with a swollen tongue.  Denies rash, difficulty breathing or swallowing, hoarseness, nausea vomiting, abdominal pain, chest pain or shortness of breath.  Patient states he was recently started on Augmentin for UTI as he was resistant to [place].  Report that he had only taken the Augmentin for 2 days prior to onset of angioedema.  He does not have reported history of angioedema although he is on his inhibitors which she has been taking for the past 20 years.    On arrival to the ED, he was afebrile with blood pressure 148/76 mm Hg and pulse rate 108 beats/min. There were no focal neurological deficits; he was alert and oriented x 4.  Initial labs revealed elevated BUN 38, creatinine 2.04, with unremarkable CBC.  Patient will be admitted under hospitalist service for further management.  PAST MEDICAL HISTORY:   Past Medical History:  Diagnosis Date   Arteriosclerosis of coronary artery 08/26/2011   Overview:      a.  1999 PCI of the mid LAD with stent.      b.  2002 Cath [place]: EF 56%. RCA-distal 25/25%. Left main-50% ostial.  Left circumflex-25% OM2.  LAD-25% proximal.  75% mid.  25/25% distal. 75% D1.      c.  07/2011  PCI of RCA with DES.[place]    Bleeding gastrointestinal 08/26/2011   Overview:      a.  Chronic abdominal pain, present improving.      b.  Duodenitis and gastritis by EGD in 2000.      c.  Pylori in 2000, treated.      d.  Gastroesophageal reflux disease.      e.  Diverticular disease.     BP (high blood pressure) 08/26/2011   Closed dislocation of right ankle 09/10/2016   Successfully reduced in ER 09/10/2016   Diffuse large B-cell lymphoma ([place]) 2015   chemo tx's   GERD (gastroesophageal reflux disease)    Heart disease    Helicobacter pylori infection 06/18/2015   by EGD RX 08/18/1999    History of adenomatous polyp of colon 06/18/2015   HOH (hard of hearing)    Bilateral Hearing  Aids   Malleolar fracture (Right) 09/10/2016    PAST SURGICAL HISTORY:   Past Surgical History:  Procedure Laterality Date   ANGIOPLASTY     CATARACT EXTRACTION     COLONOSCOPY  2016   CORONARY ANGIOPLASTY     EYE SURGERY Bilateral    Cataract Extraction with IOL   GREEN LIGHT LASER TURP (TRANSURETHRAL RESECTION OF PROSTATE N/A 04/16/2015   Procedure: GREEN LIGHT LASER TURP (TRANSURETHRAL RESECTION OF PROSTATE WITH BLADDER BIOPSY;  Surgeon: Collier Flowers, MD;  Location: [facility] ORS;  Service: Urology;  Laterality: N/A;  heart stent placement     1998, 2000, 2012   TRANSURETHRAL RESECTION OF PROSTATE N/A 03/02/2017   Procedure: TRANSURETHRAL RESECTION OF THE PROSTATE (TURP);  Surgeon: Hollice Espy, MD;  Location: [facility] ORS;  Service: Urology;  Laterality: N/A;    SOCIAL HISTORY:   Social History   Tobacco Use   Smoking status: Former Smoker    Packs/day: 1.50    Years: 12.00    Pack years: 18.00    Types: Cigarettes    Quit date: 08/24/1971    Years since quitting: 47.5   Smokeless tobacco: Never Used  Substance Use Topics   Alcohol use: No    Alcohol/week: 0.0 standard drinks    FAMILY HISTORY:   Family History  Problem Relation Age of Onset   Osteoporosis Mother      Alzheimer's disease Father    Kidney disease Neg Hx    Prostate cancer Neg Hx    Bladder Cancer Neg Hx    Kidney cancer Neg Hx     DRUG ALLERGIES:   Allergies  Allergen Reactions   Augmentin [Amoxicillin-Pot Clavulanate]     Tongue swelling 2 days after taking   Ciprofloxacin Diarrhea   Lisinopril Other (See Comments)    Unknown    Penicillins Swelling    unknwon reaction.  tolerates amoxicillin Has patient had a PCN reaction causing immediate rash, facial/tongue/throat swelling, SOB or lightheadedness with hypotension: No Has patient had a PCN reaction causing severe rash involving mucus membranes or skin necrosis: No Has patient had a PCN reaction that required hospitalization: No Has patient had a PCN reaction occurring within the last 10 years: Yes If all of the above answers are "NO", then may proceed with Cephalosporin use.    Bactrim [Sulfamethoxazole-Trimethoprim] Rash   Sulfa Antibiotics Itching and Rash    REVIEW OF SYSTEMS:   Review of Systems  Constitutional: Negative for chills, fever, malaise/fatigue and weight loss.  HENT: Negative for congestion, hearing loss and sore throat.        Tongue swelling  Eyes: Negative for blurred vision and double vision.  Respiratory: Negative for cough, shortness of breath and wheezing.   Cardiovascular: Negative for chest pain, palpitations, orthopnea and leg swelling.  Gastrointestinal: Negative for abdominal pain, diarrhea, nausea and vomiting.  Genitourinary: Positive for dysuria, flank pain, frequency and urgency.  Musculoskeletal: Negative for myalgias.  Skin: Negative for rash.  Neurological: Negative for dizziness, sensory change, speech change, focal weakness and headaches.  Psychiatric/Behavioral: Negative for depression.   MEDICATIONS AT HOME:   Prior to Admission medications   Medication Sig Start Date End Date Taking? Authorizing Provider  amoxicillin-clavulanate (AUGMENTIN) 875-125 MG tablet  Take 1 tablet by mouth 2 (two) times daily. 03/12/19  Yes Billey Co, MD  Cholecalciferol (VITAMIN D3) 1000 units CAPS Take 1,000 Units by mouth daily.   Yes [provider]  clopidogrel (PLAVIX) 75 MG tablet Take 1 tablet (75 mg total) by mouth daily. 02/26/19  Yes [name], MD  famotidine (PEPCID) 20 MG tablet Take 1 tablet (20 mg total) by mouth 2 (two) times daily. 07/26/18  Yes [name], MD  finasteride (PROSCAR) 5 MG tablet Take 1 tablet (5 mg total) by mouth daily. 11/01/18  Yes McGowan, Larene [place] A, PA-C  gabapentin (NEURONTIN) 300 MG capsule Take 1 capsule (300 mg total) by mouth at bedtime. Do not take if you take temazepam 01/30/19  Yes [name], MD  isosorbide dinitrate (ISORDIL) 30 MG tablet Take 30  mg by mouth daily.   Yes [provider]  Magnesium 250 MG TABS Take 250 mg by mouth daily.   Yes [provider]  metoprolol succinate (TOPROL-XL) 25 MG 24 hr tablet Take 1 tablet (25 mg total) by mouth daily. 10/19/18  Yes [name], MD  Multiple Vitamins-Minerals (MULTIVITAMIN ADULT PO) Take by mouth daily.   Yes [provider]  Multiple Vitamins-Minerals (VISION FORMULA 2 PO) Take by mouth daily.   Yes [provider]  nitrofurantoin, macrocrystal-monohydrate, (MACROBID) 100 MG capsule Take 1 capsule (100 mg total) by mouth 2 (two) times daily. 03/01/19  Yes Lloyd Huger, MD  psyllium (METAMUCIL) 58.6 % packet Take 1 packet by mouth daily.   Yes [provider]  benazepril-hydrochlorthiazide (LOTENSIN HCT) 5-6.25 MG tablet Take 1 tablet by mouth once daily 10/19/18 03/16/19  [name], MD      VITAL SIGNS:  Blood pressure 138/69, pulse 86, temperature 98.7 F (37.1 C), temperature source Oral, resp. rate 15, height 6\' 1"  (1.854 m), weight 113 kg, SpO2 98 %.  PHYSICAL EXAMINATION:   Physical Exam  GENERAL:  85 y.o.-year-old patient lying in the bed with no acute distress.  EYES: Pupils  equal, round, reactive to light and accommodation. No scleral icterus. Extraocular muscles intact.  HEENT: Head atraumatic, normocephalic. Oropharynx and nasopharynx clear. Orolingual swelling NECK:  Supple, no jugular venous distention. No thyroid enlargement, no tenderness.  LUNGS: Normal breath sounds bilaterally, no wheezing, rales,rhonchi or crepitation. No use of accessory muscles of respiration.  CARDIOVASCULAR: S1, S2 normal. No murmurs, rubs, or gallops.  ABDOMEN: Soft, nontender, nondistended. Bowel sounds present. No organomegaly or mass.  EXTREMITIES: No pedal edema, cyanosis, or clubbing. No rash or lesions. + pedal pulses MUSCULOSKELETAL: Normal bulk, and power was 5+ grip and elbow, knee, and ankle flexion and extension bilaterally.  NEUROLOGIC:Alert and oriented x 3. CN 2-12 intact. Sensation to light touch and cold stimuli intact bilaterally. Babinski is downgoing. DTR's (biceps, patellar, and achilles) 2+ and symmetric throughout. Gait not tested due to safety concern. PSYCHIATRIC: The patient is alert and oriented x 3.  SKIN: No obvious rash, lesion, or ulcer.   DATA REVIEWED:  LABORATORY PANEL:   CBC Recent Labs  Lab 03/16/19 0742  WBC 10.0  HGB 11.6*  HCT 35.6*  PLT 368   ------------------------------------------------------------------------------------------------------------------  Chemistries  Recent Labs  Lab 03/16/19 0742  NA 136  K 4.2  CL 103  CO2 22  GLUCOSE 116*  BUN 38*  CREATININE 2.04*  CALCIUM 8.9   ------------------------------------------------------------------------------------------------------------------  Cardiac Enzymes No results for input(s): TROPONINI in the last 168 hours. ------------------------------------------------------------------------------------------------------------------  RADIOLOGY:  No results found.  EKG:  EKG: there are no previous tracings available for comparison.  IMPRESSION AND PLAN:   85 y.o.  male with past medical history of urinary retention on CIC, bladder diverticulum, recent diagnosis of bladder cancer receiving radiation treatment to the pelvis for palliation of his left-sided abdominal pain, left-sided hydronephrosis with worsening renal function, GI bleed and CAD presenting to the ED with oral lingual angioedema.  1. Orolingual angioedema -likely drug-induced - Admit to medsurg unit -Treated with Benadryl, epinephrine Decadron and famotidine in the ED - Discontinue ACE inhibitor + Augmentin - Monitor for worsening symptoms  2. Urinary tract infection without hematuria  - Previously started on Augmentin however developed allergic reaction - Urine culture on 03/12/2019+ for E. Coli resistant to Macrobid - start aztreonam  3. Left-sided hydronephrosis -recent diagnosis secondary to obstructing  left hemipelvic mass -BUN/creatinine elevated -Urology consult.  Discussed with Dr. Versie Starks who recommends that patient be started on IV antibiotics and kept n.p.o. for possible stent placement today or tomorrow  4. Bladder cancer metastasized to pelvic region -Being managed by the cancer center, Dr. Grayland Ormond with chemotherapy and Dr. Donella Stade for palliative radiation  5. Bladder diverticulum -consensus course of bladder malignancy - Following with urology and oncology  All the records are reviewed and case discussed with ED provider. Management plans discussed with the patient, family and they are in agreement.  CODE STATUS: FULL  TOTAL TIME TAKING CARE OF THIS PATIENT: 50 minutes.    on 03/16/2019 at 1:08 PM  Rufina Falco, DNP, FNP-BC [facility] Nurse Practitioner Between 7am to 6pm - Pager [phone]  After 6pm go to [email or web address] - password [email or web address] [facility]  Office  [phone]  CC: Primary care physician; [name], MD

## 2019-03-16 NOTE — ED Provider Notes (Signed)
[facility] Emergency Department Provider Note  ____________________________________________   First MD Initiated Contact with Patient 03/16/19 0720     (approximate)  I have reviewed the triage vital signs and the nursing notes.   HISTORY  Chief Complaint Allergic Reaction    HPI Bryan Nelson is a 85 y.o. male with lymphoma, coronary disease who presents with tongue swelling.    Patient woke up at 3 AM with a swollen tongue.  Denies having this previously.  Since then the swelling is gone down slightly but continues to be significantly swollen.  He denies difficulty with swallowing secretions, shortness of breath, rash, GI symptoms.  This is been constant, unclear what brought it on, nothing makes it worse.   Patient is noted to be on benazepril.  He has been on this for 20 years.  Denies prior issues with it.  Patient also was recently started on Augmentin due to his urine bacteria was resistant to Macrobid.  Patient does have a reported history of penicillin was a rash however has tolerated Augmentin previously.     Past Medical History:  Diagnosis Date  . Arteriosclerosis of coronary artery 08/26/2011   Overview:      a.  1999 PCI of the mid LAD with stent.      b.  2002 Cath [place]: EF 56%. RCA-distal 25/25%. Left main-50% ostial.  Left circumflex-25% OM2.  LAD-25% proximal.  75% mid.  25/25% distal. 75% D1.      c.  07/2011 PCI of RCA with DES.[place]   . Bleeding gastrointestinal 08/26/2011   Overview:      a.  Chronic abdominal pain, present improving.      b.  Duodenitis and gastritis by EGD in 2000.      c.  Pylori in 2000, treated.      d.  Gastroesophageal reflux disease.      e.  Diverticular disease.    . BP (high blood pressure) 08/26/2011  . Closed dislocation of right ankle 09/10/2016   Successfully reduced in ER 09/10/2016  . Diffuse large B-cell lymphoma ([place]) 2015   chemo tx's  . GERD (gastroesophageal reflux disease)   . Heart disease    . Helicobacter pylori infection 06/18/2015   by EGD RX 08/18/1999   . History of adenomatous polyp of colon 06/18/2015  . HOH (hard of hearing)    Bilateral Hearing  Aids  . Malleolar fracture (Right) 09/10/2016    Patient Active Problem List   Diagnosis Date Noted  . Goals of care, counseling/discussion 03/03/2019  . Urothelial carcinoma ([place]) 03/03/2019  . BPH (benign prostatic hyperplasia) 03/02/2017  . Recurrent UTI 12/21/2015  . Urinary retention 09/24/2015  . Narrowing of intervertebral disc space 06/18/2015  . Diverticulitis 06/18/2015  . Esophageal reflux 06/18/2015  . Familial multiple lipoprotein-type hyperlipidemia 06/18/2015  . Insomnia 06/18/2015  . Vitamin D deficiency 06/18/2015  . DLBCL (diffuse large B cell lymphoma) ([place]) 04/10/2014  . ED (erectile dysfunction) of organic origin 08/03/2012  . Incomplete bladder emptying 08/03/2012  . Breath shortness 12/09/2011  . Arteriosclerosis of coronary artery 08/26/2011  . Bleeding gastrointestinal 08/26/2011  . HLD (hyperlipidemia) 08/26/2011  . Essential hypertension 08/26/2011  . Peripheral nerve disease 08/26/2011    Past Surgical History:  Procedure Laterality Date  . ANGIOPLASTY    . CATARACT EXTRACTION    . COLONOSCOPY  2016  . CORONARY ANGIOPLASTY    . EYE SURGERY Bilateral    Cataract Extraction with IOL  .  GREEN LIGHT LASER TURP (TRANSURETHRAL RESECTION OF PROSTATE N/A 04/16/2015   Procedure: GREEN LIGHT LASER TURP (TRANSURETHRAL RESECTION OF PROSTATE WITH BLADDER BIOPSY;  Surgeon: Collier Flowers, MD;  Location: [facility] ORS;  Service: Urology;  Laterality: N/A;  . heart stent placement     1998, 2000, 2012  . TRANSURETHRAL RESECTION OF PROSTATE N/A 03/02/2017   Procedure: TRANSURETHRAL RESECTION OF THE PROSTATE (TURP);  Surgeon: Hollice Espy, MD;  Location: [facility] ORS;  Service: Urology;  Laterality: N/A;    Prior to Admission medications   Medication Sig Start Date End Date Taking? Authorizing Provider   amoxicillin-clavulanate (AUGMENTIN) 875-125 MG tablet Take 1 tablet by mouth 2 (two) times daily. 03/12/19   Billey Co, MD  atorvastatin (LIPITOR) 10 MG tablet TAKE ONE TABLET BY MOUTH ONCE DAILY 02/19/18   [name], MD  benazepril-hydrochlorthiazide (LOTENSIN HCT) 5-6.25 MG tablet Take 1 tablet by mouth once daily 10/19/18   [name], MD  Cholecalciferol (VITAMIN D3) 1000 units CAPS Take 1,000 Units by mouth daily.    [provider]  clopidogrel (PLAVIX) 75 MG tablet Take 1 tablet (75 mg total) by mouth daily. 02/26/19   [name], MD  famotidine (PEPCID) 20 MG tablet Take 1 tablet (20 mg total) by mouth 2 (two) times daily. 07/26/18   [name], MD  fentaNYL (DURAGESIC) 25 MCG/HR Place 1 patch onto the skin every 3 (three) days. Patient not taking: Reported on 03/14/2019 03/02/19   Lloyd Huger, MD  finasteride (PROSCAR) 5 MG tablet Take 1 tablet (5 mg total) by mouth daily. 11/01/18   Zara Council A, PA-C  gabapentin (NEURONTIN) 300 MG capsule Take 1 capsule (300 mg total) by mouth at bedtime. Do not take if you take temazepam 01/30/19   [name], MD  Magnesium 250 MG TABS Take 250 mg by mouth daily.    [provider]  metoprolol succinate (TOPROL-XL) 25 MG 24 hr tablet Take 1 tablet (25 mg total) by mouth daily. 10/19/18   [name], MD  Multiple Vitamins-Minerals (MULTIVITAMIN ADULT PO) Take by mouth daily.    [provider]  Multiple Vitamins-Minerals (VISION FORMULA 2 PO) Take by mouth daily.    [provider]  nitrofurantoin, macrocrystal-monohydrate, (MACROBID) 100 MG capsule Take 1 capsule (100 mg total) by mouth 2 (two) times daily. 03/01/19   Lloyd Huger, MD  ondansetron (ZOFRAN) 8 MG tablet Take 1 tablet (8 mg total) by mouth 2 (two) times daily as needed. Patient not taking: Reported on 03/14/2019 03/06/19   Lloyd Huger, MD  Oxycodone HCl 10 MG TABS Take 1 tablet (10 mg total) by  mouth every 4 (four) hours as needed. Patient not taking: Reported on 03/14/2019 03/02/19   Lloyd Huger, MD  prochlorperazine (COMPAZINE) 10 MG tablet Take 1 tablet (10 mg total) by mouth every 6 (six) hours as needed (Nausea or vomiting). Patient not taking: Reported on 03/14/2019 03/06/19   Lloyd Huger, MD  psyllium (METAMUCIL) 58.6 % packet Take 1 packet by mouth daily.    [provider]  sucralfate (CARAFATE) 1 g tablet TAKE 1 TABLET BY MOUTH TWICE DAILY AS NEEDED FOR  HEARTBURN  OR  REFLUX 03/01/19   [name], MD    Allergies Ciprofloxacin, Lisinopril, Penicillins, Bactrim [sulfamethoxazole-trimethoprim], and Sulfa antibiotics  Family History  Problem Relation Age of Onset  . Osteoporosis Mother   . Alzheimer's disease Father   . Kidney disease Neg  Hx   . Prostate cancer Neg Hx   . Bladder Cancer Neg Hx   . Kidney cancer Neg Hx     Social History Social History   Tobacco Use  . Smoking status: Former Smoker    Packs/day: 1.50    Years: 12.00    Pack years: 18.00    Types: Cigarettes    Quit date: 08/24/1971    Years since quitting: 47.5  . Smokeless tobacco: Never Used  Substance Use Topics  . Alcohol use: No    Alcohol/week: 0.0 standard drinks  . Drug use: No      Review of Systems Constitutional: No fever/chills Eyes: No visual changes. ENT: No sore throat.  Positive tongue swelling Cardiovascular: Denies chest pain. Respiratory: Denies shortness of breath. Gastrointestinal: No abdominal pain.  No nausea, no vomiting.  No diarrhea.  No constipation. Genitourinary: Negative for dysuria. Musculoskeletal: Negative for back pain. Skin: Negative for rash. Neurological: Negative for headaches, focal weakness or numbness. All other ROS negative ____________________________________________   PHYSICAL EXAM:  VITAL SIGNS: Blood pressure (!) 148/76, pulse (!) 108, temperature 98.7 F (37.1 C), temperature source Oral, resp. rate 16,  height 6\' 1"  (1.854 m), weight 113 kg, SpO2 100 %.  Constitutional: Alert and oriented. Well appearing and in no acute distress. Eyes: Conjunctivae are normal. EOMI. Head: Atraumatic. Nose: No congestion/rhinnorhea. Mouth/Throat: Mucous membranes are moist.  Significant tongue swelling Neck: No stridor. Trachea Midline. FROM Cardiovascular: Tachycardic, regular rhythm. Grossly normal heart sounds.  Good peripheral circulation. Respiratory: Normal respiratory effort.  No retractions. Lungs CTAB. Gastrointestinal: Soft and nontender. No distention. No abdominal bruits.  Musculoskeletal: No lower extremity tenderness nor edema.  No joint effusions. Neurologic:  Normal speech and language. No gross focal neurologic deficits are appreciated.  Skin:  Skin is warm, dry and intact. No rash noted. Psychiatric: Mood and affect are normal. Speech and behavior are normal. GU: Deferred   ____________________________________________   LABS (all labs ordered are listed, but only abnormal results are displayed)  Labs Reviewed  CBC WITH DIFFERENTIAL/PLATELET - Abnormal; Notable for the following components:      Result Value   RBC 4.13 (*)    Hemoglobin 11.6 (*)    HCT 35.6 (*)    All other components within normal limits  BASIC METABOLIC PANEL - Abnormal; Notable for the following components:   Glucose, Bld 116 (*)    BUN 38 (*)    Creatinine, Ser 2.04 (*)    GFR calc non Af Amer 29 (*)    GFR calc Af Amer 33 (*)    All other components within normal limits   ____________________________________________   PROCEDURES  Procedure(s) performed (including Critical Care):  .Critical Care Performed by: Vanessa [place], MD Authorized by: Vanessa Elsie, MD   Critical care provider statement:    Critical care time (minutes):  45   Critical care was necessary to treat or prevent imminent or life-threatening deterioration of the following conditions: angioedema requiring epi    Critical care was  time spent personally by me on the following activities:  Discussions with consultants, evaluation of patient's response to treatment, examination of patient, ordering and performing treatments and interventions, ordering and review of laboratory studies, ordering and review of radiographic studies, pulse oximetry, re-evaluation of patient's condition, obtaining history from patient or surrogate and review of old charts     ____________________________________________   INITIAL IMPRESSION / ASSESSMENT AND PLAN / ED COURSE  [name] was evaluated in  Emergency Department on 03/16/2019 for the symptoms described in the history of present illness. He was evaluated in the context of the global COVID-19 pandemic, which necessitated consideration that the patient might be at risk for infection with the SARS-CoV-2 virus that causes COVID-19. Institutional protocols and algorithms that pertain to the evaluation of patients at risk for COVID-19 are in a state of rapid change based on information released by regulatory bodies including the CDC and federal and state organizations. These policies and algorithms were followed during the patient's care in the ED.    Patient presents with tongue swelling.  No evidence of respiratory distress at this time.  This could be secondary to patient's ACE inhibitor versus patient recently  started on Augmentin.  Given unclear if this is anaphylactic will give a dose of epinephrine, steroids, Benadryl, Pepcid to see if there is any improvement.  Discussed with patient's son and he has had prior stents years ago.  No recent exertional chest pain.  Discussed the risk of epinephrine and they are okay with proceeding.  Low suspicion for this being secondary to infection, abscess.    8:23 AM reevaluated patient.  Symptoms feel the same at this time.  9:19 AM reevaluate the patient.  Tongue swelling has gone down significantly.  CR up to 2.04 this is around pts baseline.  Plans to have stent placed by urology.  9:38 AM paged urologist discussed patient's plan given there is no other good options for oral antibiotics.  Patient did not have any symptoms but which is being treated in order to have the stent placed.  Will discuss with them if they would want patient to be admitted for IV antibiotics.  9:42 AM Dr. Diamantina Providence currently in surgery.  Will call me when he is out.  Regardless have to observe patient for 4 hours from the epinephrine dose  10:58 AM discussed with Dr. Versie Starks who recommends that patient be started on IV antibiotics and admitted to the hospital.  They will come see patient later today.  Keep him n.p.o.  They will discuss placing a stent either today or more likely tomorrow.  Discussed with the hospital team.  They will choose the antibiotic to place patient on after reviewing the culture on 7/20.  Added on Augmentin as a allergy.  Discontinued patient's ACE inhibitor.   __________________________________   FINAL CLINICAL IMPRESSION(S) / ED DIAGNOSES   Final diagnoses:  Angioedema, initial encounter  Urinary tract infection without hematuria, site unspecified      MEDICATIONS GIVEN DURING THIS VISIT:  Medications  diphenhydrAMINE (BENADRYL) injection 25 mg (has no administration in time range)  EPINEPHrine (EPI-PEN) injection 0.3 mg (has no administration in time range)  dexamethasone (DECADRON) injection 10 mg (has no administration in time range)  famotidine (PEPCID) tablet 20 mg (has no administration in time range)     ED Discharge Orders    None       Note:  This document was prepared using Dragon voice recognition software and may include unintentional dictation errors.   Vanessa [place], MD 03/16/19 1209

## 2019-03-17 ENCOUNTER — Inpatient Hospital Stay: Payer: PPO

## 2019-03-17 ENCOUNTER — Encounter: Payer: Self-pay | Admitting: Anesthesiology

## 2019-03-17 ENCOUNTER — Inpatient Hospital Stay: Payer: PPO | Admitting: Certified Registered"

## 2019-03-17 ENCOUNTER — Encounter
Admission: EM | Disposition: A | Discharge status: Home / Self Care | Payer: Self-pay | Source: Home / Self Care | Attending: Nurse Practitioner

## 2019-03-17 DIAGNOSIS — N132 Hydronephrosis with renal and ureteral calculous obstruction: Secondary | ICD-10-CM | POA: Diagnosis not present

## 2019-03-17 HISTORY — PX: CYSTOSCOPY WITH STENT PLACEMENT: SHX5790

## 2019-03-17 SURGERY — CYSTOSCOPY, WITH STENT INSERTION
Anesthesia: General | Laterality: Left

## 2019-03-17 MED ORDER — OXYCODONE HCL 5 MG PO TABS: 5.0000 mg | ORAL_TABLET | Freq: Once | ORAL | Status: DC | PRN

## 2019-03-17 MED ORDER — PROPOFOL 10 MG/ML IV BOLUS
INTRAVENOUS | Status: AC
  Filled 2019-03-17: qty 20

## 2019-03-17 MED ORDER — LIDOCAINE HCL (CARDIAC) PF 100 MG/5ML IV SOSY
PREFILLED_SYRINGE | INTRAVENOUS | Status: DC | PRN
  Administered 2019-03-17: 100 mg via INTRAVENOUS

## 2019-03-17 MED ORDER — OXYCODONE HCL 5 MG/5ML PO SOLN: 5.0000 mg | Freq: Once | ORAL | Status: DC | PRN

## 2019-03-17 MED ORDER — ACETAMINOPHEN 325 MG PO TABS: 650.0000 mg | ORAL_TABLET | Freq: Four times a day (QID) | ORAL | Status: DC | PRN

## 2019-03-17 MED ORDER — DEXAMETHASONE SODIUM PHOSPHATE 10 MG/ML IJ SOLN
INTRAMUSCULAR | Status: DC | PRN
  Administered 2019-03-17: 4 mg via INTRAVENOUS

## 2019-03-17 MED ORDER — GLYCOPYRROLATE 0.2 MG/ML IJ SOLN
INTRAMUSCULAR | Status: AC
  Filled 2019-03-17: qty 2

## 2019-03-17 MED ORDER — GLYCOPYRROLATE 0.2 MG/ML IJ SOLN
INTRAMUSCULAR | Status: DC | PRN
  Administered 2019-03-17: 0.2 mg via INTRAVENOUS

## 2019-03-17 MED ORDER — OXYCODONE HCL 5 MG PO TABS
5.0000 mg | ORAL_TABLET | Freq: Four times a day (QID) | ORAL | Status: DC | PRN
  Administered 2019-03-17: 5 mg via ORAL
  Filled 2019-03-17: qty 1

## 2019-03-17 MED ORDER — PHENYLEPHRINE HCL (PRESSORS) 10 MG/ML IV SOLN
INTRAVENOUS | Status: AC
  Filled 2019-03-17: qty 1

## 2019-03-17 MED ORDER — LIDOCAINE HCL (PF) 2 % IJ SOLN
INTRAMUSCULAR | Status: AC
  Filled 2019-03-17: qty 10

## 2019-03-17 MED ORDER — FENTANYL CITRATE (PF) 100 MCG/2ML IJ SOLN
INTRAMUSCULAR | Status: DC | PRN
  Administered 2019-03-17 (×4): 25 ug via INTRAVENOUS

## 2019-03-17 MED ORDER — ONDANSETRON HCL 4 MG/2ML IJ SOLN
INTRAMUSCULAR | Status: DC | PRN
  Administered 2019-03-17: 4 mg via INTRAVENOUS

## 2019-03-17 MED ORDER — PROPOFOL 10 MG/ML IV BOLUS
INTRAVENOUS | Status: DC | PRN
  Administered 2019-03-17: 150 mg via INTRAVENOUS
  Administered 2019-03-17: 50 mg via INTRAVENOUS

## 2019-03-17 MED ORDER — FENTANYL CITRATE (PF) 100 MCG/2ML IJ SOLN: 25.0000 ug | INTRAMUSCULAR | Status: DC | PRN

## 2019-03-17 MED ORDER — ONDANSETRON HCL 4 MG/2ML IJ SOLN
INTRAMUSCULAR | Status: AC
  Filled 2019-03-17: qty 2

## 2019-03-17 MED ORDER — LACTATED RINGERS IV SOLN
INTRAVENOUS | Status: DC | PRN
  Administered 2019-03-17: 14:00:00 via INTRAVENOUS

## 2019-03-17 MED ORDER — ATORVASTATIN CALCIUM 10 MG PO TABS: 10.0000 mg | ORAL_TABLET | Freq: Every day | ORAL | 11 refills | Status: AC

## 2019-03-17 MED ORDER — DEXAMETHASONE SODIUM PHOSPHATE 10 MG/ML IJ SOLN
INTRAMUSCULAR | Status: AC
  Filled 2019-03-17: qty 1

## 2019-03-17 MED ORDER — DOXYCYCLINE MONOHYDRATE 100 MG PO TABS: 100.0000 mg | ORAL_TABLET | Freq: Two times a day (BID) | ORAL | 0 refills | Status: DC

## 2019-03-17 MED ORDER — EPHEDRINE SULFATE 50 MG/ML IJ SOLN
INTRAMUSCULAR | Status: AC
  Filled 2019-03-17: qty 1

## 2019-03-17 MED ORDER — HYDROCHLOROTHIAZIDE 25 MG PO TABS: 25.0000 mg | ORAL_TABLET | Freq: Every day | ORAL | 0 refills | Status: DC

## 2019-03-17 MED ORDER — EPHEDRINE SULFATE 50 MG/ML IJ SOLN
INTRAMUSCULAR | Status: DC | PRN
  Administered 2019-03-17: 5 mg via INTRAVENOUS

## 2019-03-17 MED ORDER — FENTANYL CITRATE (PF) 100 MCG/2ML IJ SOLN
INTRAMUSCULAR | Status: AC
  Filled 2019-03-17: qty 2

## 2019-03-17 SURGICAL SUPPLY — 18 items
BAG DRAIN CYSTO-URO LG1000N (MISCELLANEOUS) ×3 IMPLANT
CATH URETL 5X70 OPEN END (CATHETERS) ×3 IMPLANT
CATH URETL OPEN END 6X70 (CATHETERS) ×3 IMPLANT
GLOVE BIO SURGEON STRL SZ7.5 (GLOVE) ×9 IMPLANT
GLOVE BIO SURGEON STRL SZ8 (GLOVE) ×3 IMPLANT
GOWN STRL REUS W/ TWL LRG LVL4 (GOWN DISPOSABLE) ×1 IMPLANT
GOWN STRL REUS W/ TWL XL LVL3 (GOWN DISPOSABLE) ×1 IMPLANT
GOWN STRL REUS W/TWL LRG LVL4 (GOWN DISPOSABLE) ×2
GOWN STRL REUS W/TWL XL LVL3 (GOWN DISPOSABLE) ×2
GUIDEWIRE STR ZIPWIRE 035X150 (MISCELLANEOUS) ×3 IMPLANT
PACK CYSTO AR (MISCELLANEOUS) ×3 IMPLANT
SET CYSTO W/LG BORE CLAMP LF (SET/KITS/TRAYS/PACK) ×3 IMPLANT
SOL .9 NS 3000ML IRR  AL (IV SOLUTION) ×2
SOL .9 NS 3000ML IRR UROMATIC (IV SOLUTION) ×1 IMPLANT
STENT URET 6FRX24 CONTOUR (STENTS) IMPLANT
STENT URET 6FRX26 CONTOUR (STENTS) IMPLANT
STENT URET 6FRX28 CONTOUR (STENTS) ×3 IMPLANT
WATER STERILE IRR 1000ML POUR (IV SOLUTION) ×3 IMPLANT

## 2019-03-17 NOTE — Progress Notes (Signed)
  Bryan Nelson presents this afternoon for left stent placement. He's doing well and ready to get home.  Vitals:   03/17/19 1137 03/17/19 1138  BP: (!) 166/80 (!) 166/80  Pulse: 79 80  Resp:    Temp:    SpO2: 100%      Intake/Output Summary (Last 24 hours) at 03/17/2019 1358 Last data filed at 03/16/2019 2117 Gross per 24 hour  Intake 355.51 ml  Output 0 ml  Net 355.51 ml   In pre-op Alert and O x 3 NAD   CBC    Component Value Date/Time   WBC 10.0 03/16/2019 0742   RBC 4.13 (L) 03/16/2019 0742   HGB 11.6 (L) 03/16/2019 0742   HGB 13.1 08/10/2018 0821   HCT 35.6 (L) 03/16/2019 0742   HCT 38.9 08/10/2018 0821   PLT 368 03/16/2019 0742   PLT 240 08/10/2018 0821   MCV 86.2 03/16/2019 0742   MCV 88 08/10/2018 0821   MCV 91 09/16/2014 0928   MCH 28.1 03/16/2019 0742   MCHC 32.6 03/16/2019 0742   RDW 14.6 03/16/2019 0742   RDW 14.4 08/10/2018 0821   RDW 16.4 (H) 09/16/2014 0928   LYMPHSABS 1.2 03/16/2019 0742   LYMPHSABS 0.8 05/07/2016 1055   LYMPHSABS 0.5 (L) 09/16/2014 0928   MONOABS 1.0 03/16/2019 0742   MONOABS 0.7 09/16/2014 0928   EOSABS 0.3 03/16/2019 0742   EOSABS 0.5 (H) 05/07/2016 1055   EOSABS 0.2 09/16/2014 0928   BASOSABS 0.0 03/16/2019 0742   BASOSABS 0.0 05/07/2016 1055   BASOSABS 0.1 09/16/2014 0928   BMET    Component Value Date/Time   NA 136 03/16/2019 0742   NA 140 11/09/2018 0908   NA 144 09/16/2014 0928   K 4.2 03/16/2019 0742   K 3.5 09/16/2014 0928   CL 103 03/16/2019 0742   CL 105 09/16/2014 0928   CO2 22 03/16/2019 0742   CO2 31 09/16/2014 0928   GLUCOSE 116 (H) 03/16/2019 0742   GLUCOSE 106 (H) 09/16/2014 0928   BUN 38 (H) 03/16/2019 0742   BUN 19 11/09/2018 0908   BUN 17 09/16/2014 0928   CREATININE 2.04 (H) 03/16/2019 0742   CREATININE 1.01 09/16/2014 0928   CALCIUM 8.9 03/16/2019 0742   CALCIUM 8.4 (L) 09/16/2014 0928   GFRNONAA 29 (L) 03/16/2019 0742   GFRNONAA >60 09/16/2014 0928   GFRNONAA >60 04/30/2014 1121   GFRAA  33 (L) 03/16/2019 0742   GFRAA >60 09/16/2014 0928   GFRAA >60 04/30/2014 1121   I reviewed the 03/01/2019 CT images.   A/P: Assessment & Plan:   In summary, the patient is an 85 year old male with metastatic bladder cancer and left-sided hydroureteronephrosis down to a mass in the pelvis consistent with metastatic bladder cancer.  He also has a recent resistant E coli UTI.  He is non-toxic appearing in pre-op. NAD.   Plan for left ureteral stent placement. I discussed with the patient the nature, potential benefits, risks and alternatives to cystoscopy, left retrograde pyelogram, left ureteral stent placement, including side effects of the proposed treatment, the likelihood of the patient achieving the goals of the procedure, and any potential problems that might occur during the procedure or recuperation. We discussed possibility of failure to gain RG access and need for a left PCNx tube. All questions answered. Patient elects to proceed.   Keep scheduled follow-up with Dr. Erlene Quan

## 2019-03-17 NOTE — Discharge Summary (Signed)
[facility] - [place] at [facility], 85 y.o., DOB [birth year], MRN [id number]. Admission date: 03/16/2019 Discharge Date 03/17/2019 Primary MD [name], MD Admitting Physician Lang Snow, NP  Admission Diagnosis  Angioedema, initial encounter [T78.3XXA] Urinary tract infection without hematuria, site unspecified [N39.0]  Discharge Diagnosis   Active Problems:   Angioedema due to Augmentin or ACE inhibitor Left sided hydronephrosis status post stent Urinary tract infection without hematuria Bladder cancer with mets to the pelvic region Bladder diverticulum     Hospital Course  85 y.o. male with past medical history of urinary retention on CIC, bladder diverticulum, recent diagnosis of bladder cancer receiving radiation treatment to the pelvis for palliation of his left-sided abdominal pain, left-sided hydronephrosis with worsening renal function, GI bleed and CAD presenting to the ED with oral lingual angioedema.  Patient was evaluated in the ED and admitted.  He was recently started on Augmentin 2 days ago but also on ACE inhibitor.  Either of these drugs could have caused the angioedema both of these drugs have been discontinued.  Patient swelling is resolved.  He is very anxious to go home.  He does have history of having left-sided hydronephrosis and underwent a stent placement today tolerated well.             Consults  urology  Significant Tests:  See full reports for all details     Ct Abdomen Pelvis Wo Contrast  Result Date: 03/01/2019 CLINICAL DATA:  Progressive left-sided abdominal pain. Invasive high-grade urothelial carcinoma with squamous differentiation. EXAM: CT ABDOMEN AND PELVIS WITHOUT CONTRAST TECHNIQUE: Multidetector CT imaging of the abdomen and pelvis was performed following the standard protocol without IV contrast. COMPARISON:  CT scan dated 02/01/2019 FINDINGS: Lower chest: Aortic atherosclerosis. Extensive  coronary artery calcifications. Heart size is normal. Minimal atelectasis at the left lung base. No effusions. Hepatobiliary: No focal liver abnormality is seen. No gallstones, gallbladder wall thickening, or biliary dilatation. Pancreas: Unremarkable. No pancreatic ductal dilatation or surrounding inflammatory changes. Spleen: Normal in size without focal abnormality. Adrenals/Urinary Tract: There has been progression of the irregular mass in the left side of the pelvis which probably arises from the superior aspect of a left-sided bladder diverticulum. The mass now measures approximately 4.2 x 4.2 x 4.0 cm. There is persistent left hydronephrosis due to distal ureteral obstruction by the mass. This is unchanged. There is slight soft tissue stranding from the mass to the left superolateral aspect of the dome of the bladder, probably representing tumor, new since the prior exam. There is a new nodular extension of the mass anteriorly seen on image 73 of series 2, 16 mm in diameter. There is new soft tissue stranding extending to the sigmoid portion of the colon best seen on images 76 and 77 of series 2. The mass also extends into the left psoas muscle on image 71 of series 2. Stomach/Bowel: There are few diverticula in the sigmoid colon. Other than the tumor stranding toward the sigmoid colon as described above, the bowel appears normal. Appendix is normal. Vascular/Lymphatic: Extensive aortic atherosclerosis. Reproductive: Prostate is unremarkable. Other: No free air or free fluid or abdominal wall hernia. Musculoskeletal: No acute abnormalities. Multilevel degenerative disc disease. Old anterior wedge deformity of T12. IMPRESSION: 1. There has been progression of the irregular mass in the left side of the pelvis which probably arises from the superior aspect of a left-sided bladder diverticulum. The mass now measures approximately 4.2 x 4.2 x 4.0 cm.  The mass now has soft tissue stranding to the left psoas muscle,  sigmoid portion of the colon, and the dome of the bladder. 2. Persistent left hydronephrosis due to distal ureteral obstruction by the mass. 3. Aortic atherosclerosis and coronary artery calcifications. 4. Sigmoid diverticulosis without diverticulitis. 5. Old anterior wedge deformity of T12. Aortic Atherosclerosis (ICD10-I70.0). Electronically Signed   By: Lorriane Shire M.D.   On: 03/01/2019 07:30   Ct Biopsy  Result Date: 02/21/2019 INDICATION: Left pelvic sidewall enlarging mass. Remote history of lymphoma and recently treated for diverticulitis. EXAM: CT-GUIDED BIOPSY LEFT PELVIC SIDEWALL MASS MEDICATIONS: 1% LIDOCAINE LOCAL ANESTHESIA/SEDATION: 2.0 mg IV Versed; 50 mcg IV Fentanyl Moderate Sedation Time:  12 MINUTES The patient was continuously monitored during the procedure by the interventional radiology nurse under my direct supervision. PROCEDURE: The procedure, risks, benefits, and alternatives were explained to the patient. Questions regarding the procedure were encouraged and answered. The patient understands and consents to the procedure. Previous imaging reviewed. Patient positioned supine. Noncontrast localization CT performed. The left pelvic sidewall irregular enlarging soft tissue mass was localized and marked. This was correlate with prior imaging. Overlying skin marked for an anterior oblique approach. Under sterile conditions and local anesthesia, a 17 gauge 11.8 cm access needle was advanced under CT guidance to the abnormality. Needle position confirmed with CT. 18 gauge core biopsies obtained. These were placed in saline. Postprocedure imaging demonstrates no hemorrhage or hematoma. Patient tolerated the procedure well without complication. Vital sign monitoring by nursing staff during the procedure will continue as patient is in the special procedures unit for post procedure observation. FINDINGS: The images document guide needle placement within the left pelvic sidewall mass. Post biopsy  images demonstrate no hemorrhage or hematoma. COMPLICATIONS: None immediate. IMPRESSION: Successful CT-guided core biopsy of the left pelvic sidewall enlarging indeterminate mass Electronically Signed   By: Jerilynn Mages.  Shick M.D.   On: 02/21/2019 15:00   Dg C-arm 1-60 Min-no Report  Result Date: 03/17/2019 Fluoroscopy was utilized by the requesting physician.  No radiographic interpretation.       Today   Subjective:   Bryan Nelson patient doing well denies any complaints very anxious to go home Objective:   Blood pressure (!) 160/97, pulse 82, temperature 97.7 F (36.5 C), resp. rate 14, height 6\' 1"  (1.854 m), weight 113 kg, SpO2 100 %.  .  Intake/Output Summary (Last 24 hours) at 03/17/2019 1546 Last data filed at 03/17/2019 1513 Gross per 24 hour  Intake 380.51 ml  Output 0 ml  Net 380.51 ml    Exam VITAL SIGNS: Blood pressure (!) 160/97, pulse 82, temperature 97.7 F (36.5 C), resp. rate 14, height 6\' 1"  (1.854 m), weight 113 kg, SpO2 100 %.  GENERAL:  85 y.o.-year-old patient lying in the bed with no acute distress.  EYES: Pupils equal, round, reactive to light and accommodation. No scleral icterus. Extraocular muscles intact.  HEENT: Head atraumatic, normocephalic. Oropharynx and nasopharynx clear.  NECK:  Supple, no jugular venous distention. No thyroid enlargement, no tenderness.  LUNGS: Normal breath sounds bilaterally, no wheezing, rales,rhonchi or crepitation. No use of accessory muscles of respiration.  CARDIOVASCULAR: S1, S2 normal. No murmurs, rubs, or gallops.  ABDOMEN: Soft, nontender, nondistended. Bowel sounds present. No organomegaly or mass.  EXTREMITIES: No pedal edema, cyanosis, or clubbing.  NEUROLOGIC: Cranial nerves II through XII are intact. Muscle strength 5/5 in all extremities. Sensation intact. Gait not checked.  PSYCHIATRIC: The patient is alert and oriented x 3.  SKIN: No  obvious rash, lesion, or ulcer.   Data Review     CBC w Diff:  Lab Results   Component Value Date   WBC 10.0 03/16/2019   HGB 11.6 (L) 03/16/2019   HGB 13.1 08/10/2018   HCT 35.6 (L) 03/16/2019   HCT 38.9 08/10/2018   PLT 368 03/16/2019   PLT 240 08/10/2018   LYMPHOPCT 12 03/16/2019   LYMPHOPCT 13.3 09/16/2014   MONOPCT 10 03/16/2019   MONOPCT 18.7 09/16/2014   EOSPCT 3 03/16/2019   EOSPCT 6.3 09/16/2014   BASOPCT 0 03/16/2019   BASOPCT 1.5 09/16/2014   CMP:  Lab Results  Component Value Date   NA 136 03/16/2019   NA 140 11/09/2018   NA 144 09/16/2014   K 4.2 03/16/2019   K 3.5 09/16/2014   CL 103 03/16/2019   CL 105 09/16/2014   CO2 22 03/16/2019   CO2 31 09/16/2014   BUN 38 (H) 03/16/2019   BUN 19 11/09/2018   BUN 17 09/16/2014   CREATININE 2.04 (H) 03/16/2019   CREATININE 1.01 09/16/2014   GLU 139 11/22/2016   PROT 7.7 03/01/2019   PROT 6.5 11/09/2018   PROT 6.3 (L) 09/16/2014   ALBUMIN 4.2 03/01/2019   ALBUMIN 4.3 11/09/2018   ALBUMIN 3.4 09/16/2014   BILITOT 0.7 03/01/2019   BILITOT 0.3 11/09/2018   BILITOT 0.3 09/16/2014   ALKPHOS 71 03/01/2019   ALKPHOS 76 09/16/2014   AST 17 03/01/2019   AST 20 09/16/2014   ALT 15 03/01/2019   ALT 33 09/16/2014  .  Micro Results Recent Results (from the past 240 hour(s))  SARS Coronavirus 2 (Performed in [place] hospital lab)     Status: None   Collection Time: 03/12/19 10:10 AM   Specimen: Nasal Swab  Result Value Ref Range Status   SARS Coronavirus 2 NEGATIVE NEGATIVE Final    Comment: (NOTE) SARS-CoV-2 target nucleic acids are NOT DETECTED. The SARS-CoV-2 RNA is generally detectable in upper and lower respiratory specimens during the acute phase of infection. Negative results do not preclude SARS-CoV-2 infection, do not rule out co-infections with other pathogens, and should not be used as the sole basis for treatment or other patient management decisions. Negative results must be combined with clinical observations, patient history, and epidemiological information. The  expected result is Negative. Fact Sheet for Patients: SugarRoll.be Fact Sheet for Healthcare Providers: https://www.woods-mathews.com/ This test is not yet approved or cleared by the [place] FDA and  has been authorized for detection and/or diagnosis of SARS-CoV-2 by FDA under an Emergency Use Authorization (EUA). This EUA will remain  in effect (meaning this test can be used) for the duration of the COVID-19 declaration under Section 56 4(b)(1) of the Act, 21 U.S.C. section 360bbb-3(b)(1), unless the authorization is terminated or revoked sooner. Performed at [facility], [place] [address] [place], [place] [address]   CULTURE, URINE COMPREHENSIVE     Status: Abnormal   Collection Time: 03/12/19 11:49 AM   Specimen: Urine   UR  Result Value Ref Range Status   Urine Culture, Comprehensive Final report (A)  Final   Organism ID, Bacteria Escherichia coli (A)  Final    Comment: 50,000-100,000 colony forming units per mL   ANTIMICROBIAL SUSCEPTIBILITY Comment  Final    Comment:       ** S = Susceptible; I = Intermediate; R = Resistant **  P = Positive; N = Negative             MICS are expressed in micrograms per mL    Antibiotic                 RSLT#1    RSLT#2    RSLT#3    RSLT#4 Amoxicillin/Clavulanic Acid    S Ampicillin                     R Cefazolin                      R Cefepime                       S Ceftriaxone                    S Cefuroxime                     I Ciprofloxacin                  R Ertapenem                      S Gentamicin                     S Imipenem                       S Levofloxacin                   R Meropenem                      S Nitrofurantoin                 R Piperacillin/Tazobactam        S Tetracycline                   S Tobramycin                     S Trimethoprim/Sulfa             R   Microscopic Examination     Status: Abnormal   Collection Time: 03/12/19  11:49 AM   URINE  Result Value Ref Range Status   WBC, UA >30 (A) 0 - 5 /hpf Final   RBC 3-10 (A) 0 - 2 /hpf Final   Epithelial Cells (non renal) 0-10 0 - 10 /hpf Final   Renal Epithel, UA 0-10 (A) None seen /hpf Final   Bacteria, UA Many (A) None seen/Few Final        Code Status Orders  (From admission, onward)         Start     Ordered   03/16/19 1257  Full code  Continuous     03/16/19 1306        Code Status History    Date Active Date Inactive Code Status Order ID Comments User Context   03/02/2017 1444 03/03/2017 1414 Full Code [id number]  Hollice Espy, MD Inpatient   Advance Care Planning Activity    Advance Directive Documentation     Most Recent Value  Type of Advance Directive  Healthcare Power of [place], Living will  Pre-existing out of facility DNR order (yellow form or pink MOST form)  -  "MOST" Form  in Place?  -          Follow-up Information    Call Hollice Espy, MD.   Specialty: Urology Contact information: [place]  [address] [phone]           Discharge Medications   Allergies as of 03/17/2019      Reactions   Augmentin [amoxicillin-pot Clavulanate]    Tongue swelling 2 days after taking   Ciprofloxacin Diarrhea   Lisinopril Other (See Comments)   Unknown    Penicillins Swelling   unknwon reaction.  tolerates amoxicillin Has patient had a PCN reaction causing immediate rash, facial/tongue/throat swelling, SOB or lightheadedness with hypotension: No Has patient had a PCN reaction causing severe rash involving mucus membranes or skin necrosis: No Has patient had a PCN reaction that required hospitalization: No Has patient had a PCN reaction occurring within the last 10 years: Yes If all of the above answers are "NO", then may proceed with Cephalosporin use.   Bactrim [sulfamethoxazole-trimethoprim] Rash   Sulfa Antibiotics Itching, Rash      Medication List    STOP taking these  medications   amoxicillin-clavulanate 875-125 MG tablet Commonly known as: AUGMENTIN   benazepril-hydrochlorthiazide 5-6.25 MG tablet Commonly known as: LOTENSIN HCT   nitrofurantoin (macrocrystal-monohydrate) 100 MG capsule Commonly known as: Macrobid     TAKE these medications   atorvastatin 10 MG tablet Commonly known as: Lipitor Take 1 tablet (10 mg total) by mouth daily.   clopidogrel 75 MG tablet Commonly known as: PLAVIX Take 1 tablet (75 mg total) by mouth daily.   doxycycline 100 MG tablet Commonly known as: ADOXA Take 1 tablet (100 mg total) by mouth 2 (two) times daily for 5 days.   famotidine 20 MG tablet Commonly known as: Pepcid Take 1 tablet (20 mg total) by mouth 2 (two) times daily.   finasteride 5 MG tablet Commonly known as: Proscar Take 1 tablet (5 mg total) by mouth daily.   gabapentin 300 MG capsule Commonly known as: NEURONTIN Take 1 capsule (300 mg total) by mouth at bedtime. Do not take if you take temazepam   hydrochlorothiazide 25 MG tablet Commonly known as: HYDRODIURIL Take 1 tablet (25 mg total) by mouth daily.   isosorbide dinitrate 30 MG tablet Commonly known as: ISORDIL Take 30 mg by mouth daily.   Magnesium 250 MG Tabs Take 250 mg by mouth daily.   metoprolol succinate 25 MG 24 hr tablet Commonly known as: TOPROL-XL Take 1 tablet (25 mg total) by mouth daily.   psyllium 58.6 % packet Commonly known as: METAMUCIL Take 1 packet by mouth daily.   VISION FORMULA 2 PO Take by mouth daily.   MULTIVITAMIN ADULT PO Take by mouth daily.   Vitamin D3 25 MCG (1000 UT) Caps Take 1,000 Units by mouth daily.          Total Time in preparing paper work, data evaluation and todays exam - 4 minutes  Dustin Flock M.D on 03/17/2019 at 3:46 PM [place]  [phone]

## 2019-03-17 NOTE — Transfer of Care (Signed)
Immediate Anesthesia Transfer of Care Note  Patient: Bryan Nelson  Procedure(s) Performed: CYSTOSCOPY WITH STENT PLACEMENT (Left )  Patient Location: PACU  Anesthesia Type:General  Level of Consciousness: drowsy  Airway & Oxygen Therapy: Patient Spontanous Breathing  Post-op Assessment: Report given to RN and Post -op Vital signs reviewed and stable  Post vital signs: Reviewed and stable  Last Vitals:  Vitals Value Taken Time  BP 158/80 03/17/19 1449  Temp    Pulse 94 03/17/19 1450  Resp 15 03/17/19 1450  SpO2 100 % 03/17/19 1450  Vitals shown include unvalidated device data.  Last Pain:  Vitals:   03/17/19 0842  TempSrc:   PainSc: 0-No pain         Complications: No apparent anesthesia complications

## 2019-03-17 NOTE — Discharge Instructions (Signed)

## 2019-03-17 NOTE — Anesthesia Postprocedure Evaluation (Signed)
Anesthesia Post Note  Patient: Bryan Nelson  Procedure(s) Performed: CYSTOSCOPY WITH STENT PLACEMENT (Left )  Patient location during evaluation: PACU Anesthesia Type: General Level of consciousness: awake and alert Pain management: pain level controlled Vital Signs Assessment: post-procedure vital signs reviewed and stable Respiratory status: spontaneous breathing, nonlabored ventilation, respiratory function stable and patient connected to nasal cannula oxygen Cardiovascular status: blood pressure returned to baseline and stable Postop Assessment: no apparent nausea or vomiting Anesthetic complications: no     Last Vitals:  Vitals:   03/17/19 1523 03/17/19 1557  BP: (!) 160/97 (!) 160/82  Pulse: 82 78  Resp: 14   Temp:    SpO2: 100% 100%    Last Pain:  Vitals:   03/17/19 1557  TempSrc:   PainSc: 0-No pain                 Precious Haws [name]

## 2019-03-17 NOTE — Op Note (Signed)
Preoperative diagnosis: Left hydro-nephrosis Postoperative diagnosis: Same  Procedure: Cystoscopy with left retrograde pyelogram and left ureteral stent placement  Surgeon: Junious Silk  Anesthesia: General  Indication for procedure: Mr. Bryan Nelson is an 85 year old male being treated for metastatic bladder cancer and he has obstruction of the left mid ureter from tumor on the left pelvic sidewall.  He has had recurrent UTI and left flank pain.  He is brought today for stent placement.  Findings: On cystoscopy there was a prior TURP defect with a partially resected prostatic fossa, regrowth and partial obstruction.  The trigone and ureteral orifice ease were somewhat closer to the bladder neck than normal given his prior surgery.  Otherwise the bladder was unremarkable.  There is moderate trabeculation.  No stone or foreign body in the bladder.  Left retrograde pyelogram-this outlined a single ureter single collecting system unit.  The distal ureter appeared normal in the mid ureter there was narrow serpiginous obstruction for several centimeters and proximal to this moderate to severe hydroureteronephrosis.   The wire and the stent actually went up with minimal resistance. I placed a 28 cm stent but he might be able to get by with a 26 cm stent.   Description of procedure: After consent was obtained patient brought to the operating room.  After adequate anesthesia he was placed in lithotomy position and prepped and draped usual sterile fashion.  A timeout was performed to confirm the patient and procedure.  The cystoscope was passed per urethra.  The bladder was inspected the right and the ureteral orifice visualized.  The left ureteral orifice was cannulated with a 5 [place] open-ended catheter and left retrograde injection of contrast was performed.  A Glidewire was advanced and found its way through the serpiginous mid ureteral obstruction and up to the proximal ureter.  The wire bowed here due to  tortuosity.  At the 5 [place] open-ended catheter was advanced into the proximal ureter and this gave the wire enough backing the past up into the collecting system where it sprung straight.  The 5 [place] open-ended catheter was advanced up into the collecting system and the wire removed.  There was a brisk hydronephrotic drip.  I injected more contrast to ensure we were in the collecting system and moderate to severe hydronephrosis and dilation of the collecting system was visualized.  The wire was advanced and coiled again in the collecting system and the 5 [place] open-ended catheter removed.  A 6 x 28 cm stent was advanced.  The wire was removed with a good coil seen in the upper calyx and a good coil in the bladder.  The stent appeared to be draining well.  Urine was clear from the stent and in the bladder.  The bladder was drained and the scope removed.  He was awakened and taken to the recovery room in stable condition.  Complications: None  Blood loss: Minimal  Specimens: None  Drains: 6 x 28 cm left ureteral stent   Disposition: Patient stable to PACU

## 2019-03-17 NOTE — Anesthesia Post-op Follow-up Note (Signed)
Anesthesia QCDR form completed.        

## 2019-03-17 NOTE — Anesthesia Preprocedure Evaluation (Signed)
Anesthesia Evaluation  Patient identified by MRN, date of birth, ID band Patient awake    Reviewed: Allergy & Precautions, H&P , NPO status , Patient's Chart, lab work & pertinent test results  History of Anesthesia Complications Negative for: history of anesthetic complications  Airway Mallampati: II  TM Distance: <3 FB Neck ROM: full    Dental  (+) Edentulous Upper, Edentulous Lower   Pulmonary neg shortness of breath, former smoker,           Cardiovascular Exercise Tolerance: Good hypertension, (-) angina+ CAD and + Cardiac Stents  (-) DOE      Neuro/Psych  Neuromuscular disease negative psych ROS   GI/Hepatic Neg liver ROS, GERD  Medicated and Controlled,  Endo/Other  negative endocrine ROS  Renal/GU      Musculoskeletal   Abdominal   Peds  Hematology negative hematology ROS (+)   Anesthesia Other Findings Past Medical History: 08/26/2011: Arteriosclerosis of coronary artery     Comment:  Overview:      a.  1999 PCI of the mid LAD with stent.                b.  2002 Cath [place]: EF 56%. RCA-distal 25/25%. Left               main-50% ostial.  Left circumflex-25% OM2.  LAD-25%               proximal.  75% mid.  25/25% distal. 75% D1.      c.                07/2011 PCI of RCA with DES.[place]  08/26/2011: Bleeding gastrointestinal     Comment:  Overview:      a.  Chronic abdominal pain, present               improving.      b.  Duodenitis and gastritis by EGD in               2000.      c.  Pylori in 2000, treated.      d.                Gastroesophageal reflux disease.      e.  Diverticular               disease.   08/26/2011: BP (high blood pressure) 09/10/2016: Closed dislocation of right ankle     Comment:  Successfully reduced in ER 09/10/2016 2015: Diffuse large B-cell lymphoma ([place])     Comment:  chemo tx's No date: GERD (gastroesophageal reflux disease) No date: Heart disease 76/28/3151: Helicobacter  pylori infection     Comment:  by EGD RX 08/18/1999  06/18/2015: History of adenomatous polyp of colon No date: HOH (hard of hearing)     Comment:  Bilateral Hearing  Aids 09/10/2016: Malleolar fracture (Right)  Past Surgical History: No date: ANGIOPLASTY No date: CATARACT EXTRACTION 2016: COLONOSCOPY No date: CORONARY ANGIOPLASTY No date: EYE SURGERY; Bilateral     Comment:  Cataract Extraction with IOL 04/16/2015: GREEN LIGHT LASER TURP (TRANSURETHRAL RESECTION OF  PROSTATE; N/A     Comment:  Procedure: GREEN LIGHT LASER TURP (TRANSURETHRAL               RESECTION OF PROSTATE WITH BLADDER BIOPSY;  Surgeon:               Collier Flowers, MD;  Location: [facility] ORS;  Service:  Urology;  Laterality: N/A; No date: heart stent placement     Comment:  1998, 2000, 2012 03/02/2017: TRANSURETHRAL RESECTION OF PROSTATE; N/A     Comment:  Procedure: TRANSURETHRAL RESECTION OF THE PROSTATE               (TURP);  Surgeon: Hollice Espy, MD;  Location: [facility]               ORS;  Service: Urology;  Laterality: N/A;  BMI    Body Mass Index: 32.87 kg/m      Reproductive/Obstetrics negative OB ROS                             Anesthesia Physical Anesthesia Plan  ASA: III  Anesthesia Plan: General LMA   Post-op Pain Management:    Induction: Intravenous  PONV Risk Score and Plan: Dexamethasone, Ondansetron, Midazolam and Treatment may vary due to age or medical condition  Airway Management Planned: LMA  Additional Equipment:   Intra-op Plan:   Post-operative Plan: Extubation in OR  Informed Consent: I have reviewed the patients History and Physical, chart, labs and discussed the procedure including the risks, benefits and alternatives for the proposed anesthesia with the patient or authorized representative who has indicated his/her understanding and acceptance.     Dental Advisory Given  Plan Discussed with: Anesthesiologist, CRNA and  Surgeon  Anesthesia Plan Comments: (Patient consented for risks of anesthesia including but not limited to:  - adverse reactions to medications - damage to teeth, lips or other oral mucosa - sore throat or hoarseness - Damage to heart, brain, lungs or loss of life  Patient voiced understanding.)        Anesthesia Quick Evaluation

## 2019-03-17 NOTE — Anesthesia Procedure Notes (Signed)
Procedure Name: LMA Insertion Date/Time: 03/17/2019 2:07 PM Performed by: Adalberto Ill, CRNA Pre-anesthesia Checklist: Patient identified, Emergency Drugs available, Suction available, Timeout performed and Patient being monitored Patient Re-evaluated:Patient Re-evaluated prior to induction Oxygen Delivery Method: Circle system utilized Preoxygenation: Pre-oxygenation with 100% oxygen Induction Type: IV induction Ventilation: Mask ventilation without difficulty LMA: LMA inserted LMA Size: 4.0 Number of attempts: 1 Airway Equipment and Method: Stylet Placement Confirmation: positive ETCO2 and breath sounds checked- equal and bilateral ETT to lip (cm): yes. Tube secured with: Tape Dental Injury: Teeth and Oropharynx as per pre-operative assessment

## 2019-03-18 ENCOUNTER — Encounter: Payer: Self-pay | Admitting: Urology

## 2019-03-18 DIAGNOSIS — C689 Malignant neoplasm of urinary organ, unspecified: Secondary | ICD-10-CM | POA: Diagnosis not present

## 2019-03-18 DIAGNOSIS — Z510 Encounter for antineoplastic radiation therapy: Secondary | ICD-10-CM | POA: Diagnosis not present

## 2019-03-18 NOTE — Progress Notes (Signed)
Bryan Nelson  [phone]  [phone]     Bryan Nelson DOB: [birth year]  MR#: [id number]  XTK#:[id number]  Patient Care Team: [name], MD as PCP - General (Family Medicine) Dingeldein, Remo Lipps, MD as Consulting Physician (Ophthalmology) Hollice Espy, MD as Consulting Physician (Urology) Yolonda Kida, MD as Consulting Physician (Cardiology) Laneta Simmers as Physician Assistant (Urology)   CHIEF COMPLAINT: History of diffuse large B-cell lymphoma, now with urothelial carcinoma.  INTERVAL HISTORY: Patient returns to clinic today for further evaluation and initiation of weekly cisplatin.  He recently had a ureteral stent placed and tolerated the procedure well.  He continues to have left flank pain. He has no neurologic complaints.  He denies any recent fevers or illnesses.  He has a fair appetite and denies weight loss.  He denies any chest pain, shortness of breath, cough, or hemoptysis.  He has no nausea, vomiting, constipation, or diarrhea.  He denies any melena or hematochezia.  He has no urinary complaints.  Patient offers no further specific complaints today.  REVIEW OF SYSTEMS:   Review of Systems  Constitutional: Negative.  Negative for diaphoresis, fever, malaise/fatigue and weight loss.  Respiratory: Negative.  Negative for cough and shortness of breath.   Cardiovascular: Negative.  Negative for chest pain and leg swelling.  Gastrointestinal: Negative.  Negative for abdominal pain, blood in stool and melena.  Genitourinary: Positive for flank pain. Negative for dysuria, frequency, hematuria and urgency.  Musculoskeletal: Negative for joint pain.  Skin: Negative.  Negative for rash.  Neurological: Negative.  Negative for dizziness, focal weakness, weakness and headaches.  Psychiatric/Behavioral: Negative.  The patient is not nervous/anxious.     As per HPI. Otherwise, a complete review of systems is  negative.  ONCOLOGY HISTORY: Oncology History   No history exists.    PAST MEDICAL HISTORY: Past Medical History:  Diagnosis Date   Arteriosclerosis of coronary artery 08/26/2011   Overview:      a.  1999 PCI of the mid LAD with stent.      b.  2002 Cath [place]: EF 56%. RCA-distal 25/25%. Left main-50% ostial.  Left circumflex-25% OM2.  LAD-25% proximal.  75% mid.  25/25% distal. 75% D1.      c.  07/2011 PCI of RCA with DES.[place]    Bleeding gastrointestinal 08/26/2011   Overview:      a.  Chronic abdominal pain, present improving.      b.  Duodenitis and gastritis by EGD in 2000.      c.  Pylori in 2000, treated.      d.  Gastroesophageal reflux disease.      e.  Diverticular disease.     BP (high blood pressure) 08/26/2011   Closed dislocation of right ankle 09/10/2016   Successfully reduced in ER 09/10/2016   Diffuse large B-cell lymphoma ([place]) 2015   chemo tx's   GERD (gastroesophageal reflux disease)    Heart disease    Helicobacter pylori infection 06/18/2015   by EGD RX 08/18/1999    History of adenomatous polyp of colon 06/18/2015   HOH (hard of hearing)    Bilateral Hearing  Aids   Malleolar fracture (Right) 09/10/2016    PAST SURGICAL HISTORY: Past Surgical History:  Procedure Laterality Date   ANGIOPLASTY     CATARACT EXTRACTION     COLONOSCOPY  2016   CORONARY ANGIOPLASTY     CYSTOSCOPY WITH STENT PLACEMENT Left 03/17/2019   Procedure: CYSTOSCOPY WITH STENT PLACEMENT;  Surgeon: Festus Aloe, MD;  Location: [facility] ORS;  Service: Urology;  Laterality: Left;   EYE SURGERY Bilateral    Cataract Extraction with IOL   GREEN LIGHT LASER TURP (TRANSURETHRAL RESECTION OF PROSTATE N/A 04/16/2015   Procedure: GREEN LIGHT LASER TURP (TRANSURETHRAL RESECTION OF PROSTATE WITH BLADDER BIOPSY;  Surgeon: Collier Flowers, MD;  Location: [facility] ORS;  Service: Urology;  Laterality: N/A;   heart stent placement     1998, 2000, 2012   TRANSURETHRAL RESECTION OF  PROSTATE N/A 03/02/2017   Procedure: TRANSURETHRAL RESECTION OF THE PROSTATE (TURP);  Surgeon: Hollice Espy, MD;  Location: [facility] ORS;  Service: Urology;  Laterality: N/A;    FAMILY HISTORY Family History  Problem Relation Age of Onset   Osteoporosis Mother    Alzheimer's disease Father    Kidney disease Neg Hx    Prostate cancer Neg Hx    Bladder Cancer Neg Hx    Kidney cancer Neg Hx    AVANCED DIRECTIVES:    HEALTH MAINTENANCE: Social History   Tobacco Use   Smoking status: Former Smoker    Packs/day: 1.50    Years: 12.00    Pack years: 18.00    Types: Cigarettes    Quit date: 08/24/1971    Years since quitting: 47.6   Smokeless tobacco: Never Used  Substance Use Topics   Alcohol use: No    Alcohol/week: 0.0 standard drinks   Drug use: No    Allergies  Allergen Reactions   Augmentin [Amoxicillin-Pot Clavulanate]     Tongue swelling 2 days after taking   Ciprofloxacin Diarrhea   Lisinopril Other (See Comments)    Unknown    Penicillins Swelling    unknwon reaction.  tolerates amoxicillin Has patient had a PCN reaction causing immediate rash, facial/tongue/throat swelling, SOB or lightheadedness with hypotension: No Has patient had a PCN reaction causing severe rash involving mucus membranes or skin necrosis: No Has patient had a PCN reaction that required hospitalization: No Has patient had a PCN reaction occurring within the last 10 years: Yes If all of the above answers are "NO", then may proceed with Cephalosporin use.    Bactrim [Sulfamethoxazole-Trimethoprim] Rash   Sulfa Antibiotics Itching and Rash    Current Outpatient Medications  Medication Sig Dispense Refill   naproxen sodium (ALEVE) 220 MG tablet Take 220 mg by mouth daily as needed.     ALPRAZolam (XANAX) 0.5 MG tablet Take 1 tablet (0.5 mg total) by mouth at bedtime as needed for anxiety. 30 tablet 1   atorvastatin (LIPITOR) 10 MG tablet Take 1 tablet (10 mg total) by mouth  daily. 30 tablet 11   Cholecalciferol (VITAMIN D3) 1000 units CAPS Take 1,000 Units by mouth daily.     clopidogrel (PLAVIX) 75 MG tablet Take 1 tablet (75 mg total) by mouth daily. 90 tablet 4   doxycycline (ADOXA) 100 MG tablet Take 1 tablet (100 mg total) by mouth 2 (two) times daily for 5 days. 10 tablet 0   famotidine (PEPCID) 20 MG tablet Take 1 tablet (20 mg total) by mouth 2 (two) times daily. 180 tablet 3   finasteride (PROSCAR) 5 MG tablet Take 1 tablet (5 mg total) by mouth daily. 90 tablet 3   gabapentin (NEURONTIN) 300 MG capsule Take 1 capsule (300 mg total) by mouth at bedtime. Do not take if you take temazepam 30 capsule 6   hydrochlorothiazide (HYDRODIURIL) 25 MG tablet Take 1 tablet (25 mg total) by mouth daily. 30 tablet 0  isosorbide dinitrate (ISORDIL) 30 MG tablet Take 30 mg by mouth daily.     Magnesium 250 MG TABS Take 250 mg by mouth daily.     metoprolol succinate (TOPROL-XL) 25 MG 24 hr tablet Take 1 tablet (25 mg total) by mouth daily. 90 tablet 4   Multiple Vitamins-Minerals (MULTIVITAMIN ADULT PO) Take by mouth daily.     Multiple Vitamins-Minerals (VISION FORMULA 2 PO) Take by mouth daily.     ondansetron (ZOFRAN) 8 MG tablet Take 1 tablet (8 mg total) by mouth 2 (two) times daily as needed for nausea or vomiting. 20 tablet 3   prochlorperazine (COMPAZINE) 10 MG tablet Take 1 tablet (10 mg total) by mouth every 6 (six) hours as needed for nausea or vomiting. 30 tablet 3   psyllium (METAMUCIL) 58.6 % packet Take 1 packet by mouth daily.     No current facility-administered medications for this visit.     OBJECTIVE: BP (!) 144/72    Pulse (!) 102    Temp 97.7 F (36.5 C)    Wt 239 lb 14.4 oz (108.8 kg)    BMI 31.65 kg/m    Body mass index is 31.65 kg/m.    ECOG FS:0 - Asymptomatic  General: Well-developed, well-nourished, no acute distress. Eyes: Pink conjunctiva, anicteric sclera. HEENT: Normocephalic, moist mucous membranes. Lungs: Clear to  auscultation bilaterally. Heart: Regular rate and rhythm. No rubs, murmurs, or gallops. Abdomen: Soft, nontender, nondistended. No organomegaly noted, normoactive bowel sounds. Musculoskeletal: No edema, cyanosis, or clubbing. Neuro: Alert, answering all questions appropriately. Cranial nerves grossly intact. Skin: No rashes or petechiae noted. Psych: Normal affect.  LAB RESULTS:  Orders Only on 03/20/2019  Component Date Value Ref Range Status   Sodium 03/20/2019 139  135 - 145 mmol/L Final   Potassium 03/20/2019 3.8  3.5 - 5.1 mmol/L Final   Chloride 03/20/2019 104  98 - 111 mmol/L Final   CO2 03/20/2019 24  22 - 32 mmol/L Final   Glucose, Bld 03/20/2019 141* 70 - 99 mg/dL Final   BUN 03/20/2019 35* 8 - 23 mg/dL Final   Creatinine, Ser 03/20/2019 1.84* 0.61 - 1.24 mg/dL Final   Calcium 03/20/2019 9.6  8.9 - 10.3 mg/dL Final   GFR calc non Af Amer 03/20/2019 33* >60 mL/min Final   GFR calc Af Amer 03/20/2019 38* >60 mL/min Final   Anion gap 03/20/2019 11  5 - 15 Final   Performed at [facility], [place], [place] [address]   WBC 03/20/2019 9.0  4.0 - 10.5 K/uL Final   RBC 03/20/2019 3.96* 4.22 - 5.81 MIL/uL Final   Hemoglobin 03/20/2019 11.1* 13.0 - 17.0 g/dL Final   HCT 03/20/2019 33.6* 39.0 - 52.0 % Final   MCV 03/20/2019 84.8  80.0 - 100.0 fL Final   MCH 03/20/2019 28.0  26.0 - 34.0 pg Final   MCHC 03/20/2019 33.0  30.0 - 36.0 g/dL Final   RDW 03/20/2019 14.4  11.5 - 15.5 % Final   Platelets 03/20/2019 346  150 - 400 K/uL Final   nRBC 03/20/2019 0.0  0.0 - 0.2 % Final   Neutrophils Relative % 03/20/2019 73  % Final   Neutro Abs 03/20/2019 6.5  1.7 - 7.7 K/uL Final   Lymphocytes Relative 03/20/2019 14  % Final   Lymphs Abs 03/20/2019 1.3  0.7 - 4.0 K/uL Final   Monocytes Relative 03/20/2019 9  % Final   Monocytes Absolute 03/20/2019 0.8  0.1 - 1.0 K/uL Final  Eosinophils Relative 03/20/2019 4  % Final   Eosinophils  Absolute 03/20/2019 0.3  0.0 - 0.5 K/uL Final   Basophils Relative 03/20/2019 0  % Final   Basophils Absolute 03/20/2019 0.0  0.0 - 0.1 K/uL Final   Immature Granulocytes 03/20/2019 0  % Final   Abs Immature Granulocytes 03/20/2019 0.03  0.00 - 0.07 K/uL Final   Performed at [facility], [address], [place], [place] [address]  Hospital Outpatient Visit on 03/19/2019  Component Date Value Ref Range Status   Glucose-Capillary 03/18/2019 234* 70 - 99 mg/dL Final   Comment 1 03/18/2019 Notify RN   Final    STUDIES: No results found.   ASSESSMENT: History of diffuse large B-cell lymphoma.  PLAN:    1. Diffuse Large B Cell Lymphoma: Patient was initially diagnosed in August 2015, pathology was noted to be anaplastic subtype.  He completed 6 cycles of R-CHOP chemotherapy in January 2016. PET scan January 2017 revealed no evidence of disease.  2.  Urothelial carcinoma: Confirmed by biopsy.  Patient's most recent CT scan on March 01, 2019 reviewed independently and reported as above with progressive left pelvic mass greater than 4 cm.  Patient appears to have no other evidence of disease.  Case discussed with urology.  Given  the fact that patient is symptomatic with pain and has localized disease, he will benefit from concurrent chemotherapy using weekly cisplatin 40 mg/m along with daily XRT.  Proceed with cycle 1 of weekly cisplatin today.  Continue daily XRT.  Return to clinic in 1 week for further evaluation and consideration of cycle 2. 3.  Hydronephrosis: Patient had ureteral stent placed last week. 4.  Renal insufficiency: Creatinine remains elevated, but improved to 1.84.  Monitor. 5.  Pain: Continue fentanyl patch and oxycodone.   Patient expressed understanding and was in agreement with this plan. He also understands that He can call clinic at any time with any questions, concerns, or complaints.    Lloyd Huger, MD   03/22/2019 6:20 AM

## 2019-03-19 ENCOUNTER — Telehealth: Payer: Self-pay

## 2019-03-19 ENCOUNTER — Ambulatory Visit: Payer: PPO

## 2019-03-19 ENCOUNTER — Ambulatory Visit
Admission: RE | Admit: 2019-03-19 | Discharge: 2019-03-19 | Disposition: A | Discharge status: Home / Self Care | Payer: PPO | Source: Ambulatory Visit | Attending: Radiation Oncology | Admitting: Radiation Oncology

## 2019-03-19 DIAGNOSIS — C689 Malignant neoplasm of urinary organ, unspecified: Secondary | ICD-10-CM | POA: Diagnosis not present

## 2019-03-19 NOTE — Telephone Encounter (Signed)
No HFU scheduled.  

## 2019-03-19 NOTE — Telephone Encounter (Signed)
Transition Care Management Follow-up Telephone Call  Date of discharge and from where: [facility] on 03/16/19.  How have you been since you were released from the hospital? Doing better but still urinating frequently. Pt feels better after emptying bladder. Has had some pain in lower left side of abdomen but minimal pain. Declines burning with urination, cloudiness, fever or n/v/d.  Any questions or concerns? No   Items Reviewed:  Did the pt receive and understand the discharge instructions provided? Yes   Medications obtained and verified? Yes   Any new allergies since your discharge? No   Dietary orders reviewed? Yes  Do you have support at home? Yes   Other (ie: DME, Home Health, etc) N/A  Functional Questionnaire: (I = Independent and D = Dependent)  Bathing/Dressing- I   Meal Prep- I  Eating- I  Maintaining continence- I  Transferring/Ambulation- I  Managing Meds- I   Follow up appointments reviewed:    PCP Hospital f/u appt confirmed? No , pt to call office today to schedule HFU apt.  [facility] f/u appt confirmed? Yes    Are transportation arrangements needed? No   If their condition worsens, is the pt aware to call  their PCP or go to the ED? Yes  Was the patient provided with contact information for the PCP's office or ED? Yes  Was the pt encouraged to call back with questions or concerns? Yes

## 2019-03-20 ENCOUNTER — Inpatient Hospital Stay (HOSPITAL_BASED_OUTPATIENT_CLINIC_OR_DEPARTMENT_OTHER): Payer: PPO | Admitting: Oncology

## 2019-03-20 ENCOUNTER — Inpatient Hospital Stay: Payer: PPO

## 2019-03-20 ENCOUNTER — Other Ambulatory Visit: Payer: Self-pay

## 2019-03-20 ENCOUNTER — Ambulatory Visit
Admission: RE | Admit: 2019-03-20 | Discharge: 2019-03-20 | Disposition: A | Discharge status: Home / Self Care | Payer: PPO | Source: Ambulatory Visit | Attending: Radiation Oncology | Admitting: Radiation Oncology

## 2019-03-20 VITALS — BP 144/72 | HR 102 | Temp 97.7°F | Wt 239.9 lb

## 2019-03-20 VITALS — HR 88

## 2019-03-20 DIAGNOSIS — C8330 Diffuse large B-cell lymphoma, unspecified site: Secondary | ICD-10-CM | POA: Diagnosis not present

## 2019-03-20 DIAGNOSIS — C689 Malignant neoplasm of urinary organ, unspecified: Secondary | ICD-10-CM | POA: Diagnosis not present

## 2019-03-20 DIAGNOSIS — N289 Disorder of kidney and ureter, unspecified: Secondary | ICD-10-CM | POA: Diagnosis not present

## 2019-03-20 DIAGNOSIS — R109 Unspecified abdominal pain: Secondary | ICD-10-CM

## 2019-03-20 DIAGNOSIS — R339 Retention of urine, unspecified: Secondary | ICD-10-CM

## 2019-03-20 DIAGNOSIS — N1330 Unspecified hydronephrosis: Secondary | ICD-10-CM

## 2019-03-20 DIAGNOSIS — Z87891 Personal history of nicotine dependence: Secondary | ICD-10-CM

## 2019-03-20 LAB — BASIC METABOLIC PANEL
Anion gap: 11 (ref 5–15)
BUN: 35 mg/dL — ABNORMAL HIGH (ref 8–23)
CO2: 24 mmol/L (ref 22–32)
Calcium: 9.6 mg/dL (ref 8.9–10.3)
Chloride: 104 mmol/L (ref 98–111)
Creatinine, Ser: 1.84 mg/dL — ABNORMAL HIGH (ref 0.61–1.24)
GFR calc Af Amer: 38 mL/min — ABNORMAL LOW (ref >60)
GFR calc non Af Amer: 33 mL/min — ABNORMAL LOW (ref >60)
Glucose, Bld: 141 mg/dL — ABNORMAL HIGH (ref 70–99)
Potassium: 3.8 mmol/L (ref 3.5–5.1)
Sodium: 139 mmol/L (ref 135–145)

## 2019-03-20 LAB — CBC WITH DIFFERENTIAL/PLATELET
Abs Immature Granulocytes: 0.03 10*3/uL (ref 0.00–0.07)
Basophils Absolute: 0 10*3/uL (ref 0.0–0.1)
Basophils Relative: 0 %
Eosinophils Absolute: 0.3 10*3/uL (ref 0.0–0.5)
Eosinophils Relative: 4 %
HCT: 33.6 % — ABNORMAL LOW (ref 39.0–52.0)
Hemoglobin: 11.1 g/dL — ABNORMAL LOW (ref 13.0–17.0)
Immature Granulocytes: 0 %
Lymphocytes Relative: 14 %
Lymphs Abs: 1.3 10*3/uL (ref 0.7–4.0)
MCH: 28 pg (ref 26.0–34.0)
MCHC: 33 g/dL (ref 30.0–36.0)
MCV: 84.8 fL (ref 80.0–100.0)
Monocytes Absolute: 0.8 10*3/uL (ref 0.1–1.0)
Monocytes Relative: 9 %
Neutro Abs: 6.5 10*3/uL (ref 1.7–7.7)
Neutrophils Relative %: 73 %
Platelets: 346 10*3/uL (ref 150–400)
RBC: 3.96 MIL/uL — ABNORMAL LOW (ref 4.22–5.81)
RDW: 14.4 % (ref 11.5–15.5)
WBC: 9 10*3/uL (ref 4.0–10.5)
nRBC: 0 % (ref 0.0–0.2)

## 2019-03-20 LAB — GLUCOSE, CAPILLARY: Glucose-Capillary: 234 mg/dL — ABNORMAL HIGH (ref 70–99)

## 2019-03-20 MED ORDER — SODIUM CHLORIDE 0.9 % IV SOLN
35.0000 mg/m2 | Freq: Once | INTRAVENOUS | Status: AC
  Administered 2019-03-20: 85 mg via INTRAVENOUS
  Filled 2019-03-20: qty 85

## 2019-03-20 MED ORDER — SODIUM CHLORIDE 0.9 % IV SOLN
Freq: Once | INTRAVENOUS | Status: AC
  Administered 2019-03-20: 13:00:00 via INTRAVENOUS
  Filled 2019-03-20: qty 5

## 2019-03-20 MED ORDER — ONDANSETRON HCL 8 MG PO TABS: 8.0000 mg | ORAL_TABLET | Freq: Two times a day (BID) | ORAL | 3 refills | Status: AC | PRN

## 2019-03-20 MED ORDER — ALPRAZOLAM 0.5 MG PO TABS: 0.5000 mg | ORAL_TABLET | Freq: Every evening | ORAL | 1 refills | Status: DC | PRN

## 2019-03-20 MED ORDER — PALONOSETRON HCL INJECTION 0.25 MG/5ML
0.2500 mg | Freq: Once | INTRAVENOUS | Status: AC
  Administered 2019-03-20: 0.25 mg via INTRAVENOUS
  Filled 2019-03-20: qty 5

## 2019-03-20 MED ORDER — PROCHLORPERAZINE MALEATE 10 MG PO TABS: 10.0000 mg | ORAL_TABLET | Freq: Four times a day (QID) | ORAL | 3 refills | Status: AC | PRN

## 2019-03-20 MED ORDER — SODIUM CHLORIDE 0.9 % IV SOLN
Freq: Once | INTRAVENOUS | Status: AC
  Administered 2019-03-20: 11:00:00 via INTRAVENOUS
  Filled 2019-03-20: qty 250

## 2019-03-20 MED ORDER — POTASSIUM CHLORIDE 2 MEQ/ML IV SOLN
Freq: Once | INTRAVENOUS | Status: AC
  Administered 2019-03-20: 11:00:00 via INTRAVENOUS
  Filled 2019-03-20: qty 1000

## 2019-03-20 NOTE — Progress Notes (Signed)
Creatinine: 1.84. MD, Dr. Grayland Ormond, notified. Per MD order: proceed with scheduled Cisplatin treatment today.

## 2019-03-21 ENCOUNTER — Other Ambulatory Visit: Payer: Self-pay

## 2019-03-21 ENCOUNTER — Ambulatory Visit
Admission: RE | Admit: 2019-03-21 | Discharge: 2019-03-21 | Disposition: A | Discharge status: Home / Self Care | Payer: PPO | Source: Ambulatory Visit | Attending: Radiation Oncology | Admitting: Radiation Oncology

## 2019-03-21 DIAGNOSIS — C689 Malignant neoplasm of urinary organ, unspecified: Secondary | ICD-10-CM | POA: Diagnosis not present

## 2019-03-22 ENCOUNTER — Inpatient Hospital Stay
Admission: EM | Admit: 2019-03-22 | Discharge: 2019-03-24 | DRG: 271 | Disposition: A | Discharge status: Home / Self Care | Payer: PPO | Attending: Surgery | Admitting: Surgery

## 2019-03-22 ENCOUNTER — Other Ambulatory Visit: Payer: Self-pay

## 2019-03-22 ENCOUNTER — Ambulatory Visit: Payer: PPO

## 2019-03-22 ENCOUNTER — Emergency Department: Payer: PPO

## 2019-03-22 ENCOUNTER — Encounter
Admission: EM | Disposition: A | Discharge status: Home / Self Care | Payer: Self-pay | Source: Home / Self Care | Attending: Vascular Surgery

## 2019-03-22 DIAGNOSIS — Z955 Presence of coronary angioplasty implant and graft: Secondary | ICD-10-CM

## 2019-03-22 DIAGNOSIS — Z882 Allergy status to sulfonamides status: Secondary | ICD-10-CM | POA: Diagnosis not present

## 2019-03-22 DIAGNOSIS — Z9221 Personal history of antineoplastic chemotherapy: Secondary | ICD-10-CM

## 2019-03-22 DIAGNOSIS — Z888 Allergy status to other drugs, medicaments and biological substances status: Secondary | ICD-10-CM | POA: Diagnosis not present

## 2019-03-22 DIAGNOSIS — Z7902 Long term (current) use of antithrombotics/antiplatelets: Secondary | ICD-10-CM

## 2019-03-22 DIAGNOSIS — R52 Pain, unspecified: Secondary | ICD-10-CM | POA: Diagnosis not present

## 2019-03-22 DIAGNOSIS — Z79899 Other long term (current) drug therapy: Secondary | ICD-10-CM

## 2019-03-22 DIAGNOSIS — C679 Malignant neoplasm of bladder, unspecified: Secondary | ICD-10-CM | POA: Diagnosis present

## 2019-03-22 DIAGNOSIS — Z881 Allergy status to other antibiotic agents status: Secondary | ICD-10-CM

## 2019-03-22 DIAGNOSIS — Z974 Presence of external hearing-aid: Secondary | ICD-10-CM

## 2019-03-22 DIAGNOSIS — I82442 Acute embolism and thrombosis of left tibial vein: Secondary | ICD-10-CM | POA: Diagnosis present

## 2019-03-22 DIAGNOSIS — Z9079 Acquired absence of other genital organ(s): Secondary | ICD-10-CM

## 2019-03-22 DIAGNOSIS — N184 Chronic kidney disease, stage 4 (severe): Secondary | ICD-10-CM | POA: Diagnosis present

## 2019-03-22 DIAGNOSIS — N1330 Unspecified hydronephrosis: Secondary | ICD-10-CM | POA: Diagnosis not present

## 2019-03-22 DIAGNOSIS — R609 Edema, unspecified: Secondary | ICD-10-CM | POA: Diagnosis not present

## 2019-03-22 DIAGNOSIS — Z8572 Personal history of non-Hodgkin lymphomas: Secondary | ICD-10-CM | POA: Diagnosis not present

## 2019-03-22 DIAGNOSIS — Z880 Allergy status to penicillin: Secondary | ICD-10-CM | POA: Diagnosis not present

## 2019-03-22 DIAGNOSIS — I82422 Acute embolism and thrombosis of left iliac vein: Principal | ICD-10-CM | POA: Diagnosis present

## 2019-03-22 DIAGNOSIS — Z87891 Personal history of nicotine dependence: Secondary | ICD-10-CM | POA: Diagnosis not present

## 2019-03-22 DIAGNOSIS — H9190 Unspecified hearing loss, unspecified ear: Secondary | ICD-10-CM | POA: Diagnosis present

## 2019-03-22 DIAGNOSIS — I80292 Phlebitis and thrombophlebitis of other deep vessels of left lower extremity: Secondary | ICD-10-CM | POA: Diagnosis not present

## 2019-03-22 DIAGNOSIS — Z8719 Personal history of other diseases of the digestive system: Secondary | ICD-10-CM

## 2019-03-22 DIAGNOSIS — I10 Essential (primary) hypertension: Secondary | ICD-10-CM | POA: Diagnosis not present

## 2019-03-22 DIAGNOSIS — I129 Hypertensive chronic kidney disease with stage 1 through stage 4 chronic kidney disease, or unspecified chronic kidney disease: Secondary | ICD-10-CM | POA: Diagnosis present

## 2019-03-22 DIAGNOSIS — N179 Acute kidney failure, unspecified: Secondary | ICD-10-CM | POA: Diagnosis present

## 2019-03-22 DIAGNOSIS — Z03818 Encounter for observation for suspected exposure to other biological agents ruled out: Secondary | ICD-10-CM | POA: Diagnosis not present

## 2019-03-22 DIAGNOSIS — R61 Generalized hyperhidrosis: Secondary | ICD-10-CM | POA: Diagnosis not present

## 2019-03-22 DIAGNOSIS — E785 Hyperlipidemia, unspecified: Secondary | ICD-10-CM | POA: Diagnosis present

## 2019-03-22 DIAGNOSIS — I82412 Acute embolism and thrombosis of left femoral vein: Secondary | ICD-10-CM | POA: Diagnosis present

## 2019-03-22 DIAGNOSIS — I82419 Acute embolism and thrombosis of unspecified femoral vein: Secondary | ICD-10-CM

## 2019-03-22 DIAGNOSIS — I82432 Acute embolism and thrombosis of left popliteal vein: Secondary | ICD-10-CM | POA: Diagnosis present

## 2019-03-22 DIAGNOSIS — M79605 Pain in left leg: Secondary | ICD-10-CM | POA: Diagnosis not present

## 2019-03-22 DIAGNOSIS — K219 Gastro-esophageal reflux disease without esophagitis: Secondary | ICD-10-CM | POA: Diagnosis present

## 2019-03-22 DIAGNOSIS — I80202 Phlebitis and thrombophlebitis of unspecified deep vessels of left lower extremity: Secondary | ICD-10-CM | POA: Diagnosis present

## 2019-03-22 DIAGNOSIS — I2510 Atherosclerotic heart disease of native coronary artery without angina pectoris: Secondary | ICD-10-CM | POA: Diagnosis present

## 2019-03-22 DIAGNOSIS — N390 Urinary tract infection, site not specified: Secondary | ICD-10-CM | POA: Diagnosis not present

## 2019-03-22 DIAGNOSIS — Z820 Family history of epilepsy and other diseases of the nervous system: Secondary | ICD-10-CM

## 2019-03-22 DIAGNOSIS — Z20828 Contact with and (suspected) exposure to other viral communicable diseases: Secondary | ICD-10-CM | POA: Diagnosis present

## 2019-03-22 HISTORY — PX: PERIPHERAL VASCULAR THROMBECTOMY: CATH118306

## 2019-03-22 LAB — CBC WITH DIFFERENTIAL/PLATELET
Abs Immature Granulocytes: 0.09 10*3/uL — ABNORMAL HIGH (ref 0.00–0.07)
Basophils Absolute: 0 10*3/uL (ref 0.0–0.1)
Basophils Relative: 0 %
Eosinophils Absolute: 0.2 10*3/uL (ref 0.0–0.5)
Eosinophils Relative: 1 %
HCT: 35.3 % — ABNORMAL LOW (ref 39.0–52.0)
Hemoglobin: 11.3 g/dL — ABNORMAL LOW (ref 13.0–17.0)
Immature Granulocytes: 1 %
Lymphocytes Relative: 12 %
Lymphs Abs: 1.8 10*3/uL (ref 0.7–4.0)
MCH: 27.7 pg (ref 26.0–34.0)
MCHC: 32 g/dL (ref 30.0–36.0)
MCV: 86.5 fL (ref 80.0–100.0)
Monocytes Absolute: 1.3 10*3/uL — ABNORMAL HIGH (ref 0.1–1.0)
Monocytes Relative: 8 %
Neutro Abs: 12.4 10*3/uL — ABNORMAL HIGH (ref 1.7–7.7)
Neutrophils Relative %: 78 %
Platelets: 457 10*3/uL — ABNORMAL HIGH (ref 150–400)
RBC: 4.08 MIL/uL — ABNORMAL LOW (ref 4.22–5.81)
RDW: 14.8 % (ref 11.5–15.5)
WBC: 15.8 10*3/uL — ABNORMAL HIGH (ref 4.0–10.5)
nRBC: 0 % (ref 0.0–0.2)

## 2019-03-22 LAB — COMPREHENSIVE METABOLIC PANEL
ALT: 21 U/L (ref 0–44)
AST: 18 U/L (ref 15–41)
Albumin: 3.7 g/dL (ref 3.5–5.0)
Alkaline Phosphatase: 57 U/L (ref 38–126)
Anion gap: 13 (ref 5–15)
BUN: 54 mg/dL — ABNORMAL HIGH (ref 8–23)
CO2: 23 mmol/L (ref 22–32)
Calcium: 9.6 mg/dL (ref 8.9–10.3)
Chloride: 105 mmol/L (ref 98–111)
Creatinine, Ser: 2.46 mg/dL — ABNORMAL HIGH (ref 0.61–1.24)
GFR calc Af Amer: 27 mL/min — ABNORMAL LOW (ref >60)
GFR calc non Af Amer: 23 mL/min — ABNORMAL LOW (ref >60)
Glucose, Bld: 125 mg/dL — ABNORMAL HIGH (ref 70–99)
Potassium: 4.6 mmol/L (ref 3.5–5.1)
Sodium: 141 mmol/L (ref 135–145)
Total Bilirubin: 0.7 mg/dL (ref 0.3–1.2)
Total Protein: 7.4 g/dL (ref 6.5–8.1)

## 2019-03-22 LAB — SARS CORONAVIRUS 2 BY RT PCR (HOSPITAL ORDER, PERFORMED IN ~~LOC~~ HOSPITAL LAB): SARS Coronavirus 2: NEGATIVE

## 2019-03-22 LAB — PROTIME-INR
INR: 1 (ref 0.8–1.2)
Prothrombin Time: 12.8 seconds (ref 11.4–15.2)

## 2019-03-22 LAB — TROPONIN I (HIGH SENSITIVITY): Troponin I (High Sensitivity): 15 ng/L (ref <18)

## 2019-03-22 LAB — APTT: aPTT: 26 seconds (ref 24–36)

## 2019-03-22 LAB — HEPARIN LEVEL (UNFRACTIONATED): Heparin Unfractionated: 0.94 IU/mL — ABNORMAL HIGH (ref 0.30–0.70)

## 2019-03-22 SURGERY — PERIPHERAL VASCULAR THROMBECTOMY
Anesthesia: Moderate Sedation | Laterality: Left

## 2019-03-22 MED ORDER — HEPARIN SODIUM (PORCINE) 1000 UNIT/ML IJ SOLN
INTRAMUSCULAR | Status: DC | PRN
  Administered 2019-03-22: 4000 [IU] via INTRAVENOUS

## 2019-03-22 MED ORDER — MAGNESIUM HYDROXIDE 400 MG/5ML PO SUSP: 30.0000 mL | Freq: Every day | ORAL | Status: DC | PRN

## 2019-03-22 MED ORDER — HYDROCHLOROTHIAZIDE 25 MG PO TABS
25.0000 mg | ORAL_TABLET | Freq: Every day | ORAL | Status: DC
  Administered 2019-03-22 – 2019-03-24 (×3): 25 mg via ORAL
  Filled 2019-03-22 (×3): qty 1

## 2019-03-22 MED ORDER — SORBITOL 70 % SOLN
30.0000 mL | Freq: Every day | Status: DC | PRN
  Filled 2019-03-22: qty 30

## 2019-03-22 MED ORDER — HYDROMORPHONE HCL 1 MG/ML IJ SOLN
0.5000 mg | Freq: Once | INTRAMUSCULAR | Status: AC
  Administered 2019-03-22: 08:00:00 0.5 mg via INTRAVENOUS
  Filled 2019-03-22: qty 1

## 2019-03-22 MED ORDER — FENTANYL CITRATE (PF) 100 MCG/2ML IJ SOLN
INTRAMUSCULAR | Status: DC | PRN
  Administered 2019-03-22: 50 ug via INTRAVENOUS
  Administered 2019-03-22 (×2): 25 ug via INTRAVENOUS

## 2019-03-22 MED ORDER — MORPHINE SULFATE (PF) 4 MG/ML IV SOLN
INTRAVENOUS | Status: AC
  Administered 2019-03-22: 4 mg via INTRAVENOUS
  Filled 2019-03-22: qty 1

## 2019-03-22 MED ORDER — PSYLLIUM 95 % PO PACK
1.0000 | PACK | Freq: Every day | ORAL | Status: DC
  Administered 2019-03-23 – 2019-03-24 (×2): 1 via ORAL
  Filled 2019-03-22 (×3): qty 1

## 2019-03-22 MED ORDER — VITAMIN D3 25 MCG (1000 UNIT) PO TABS
1000.0000 [IU] | ORAL_TABLET | Freq: Every day | ORAL | Status: DC
  Administered 2019-03-22 – 2019-03-24 (×3): 1000 [IU] via ORAL
  Filled 2019-03-22 (×6): qty 1

## 2019-03-22 MED ORDER — ALTEPLASE 2 MG IJ SOLR
INTRAMUSCULAR | Status: DC | PRN
  Administered 2019-03-22: 4 mg

## 2019-03-22 MED ORDER — DOXYCYCLINE HYCLATE 100 MG PO TABS
100.0000 mg | ORAL_TABLET | Freq: Two times a day (BID) | ORAL | Status: DC
  Administered 2019-03-22 – 2019-03-23 (×3): 100 mg via ORAL
  Filled 2019-03-22 (×3): qty 1

## 2019-03-22 MED ORDER — ALPRAZOLAM 0.5 MG PO TABS
0.5000 mg | ORAL_TABLET | Freq: Every evening | ORAL | Status: DC | PRN
  Administered 2019-03-23: 0.5 mg via ORAL
  Filled 2019-03-22: qty 1

## 2019-03-22 MED ORDER — MORPHINE SULFATE (PF) 4 MG/ML IV SOLN
INTRAVENOUS | Status: AC
  Administered 2019-03-22: 07:00:00 4 mg via INTRAVENOUS
  Filled 2019-03-22: qty 1

## 2019-03-22 MED ORDER — IODIXANOL 320 MG/ML IV SOLN
INTRAVENOUS | Status: DC | PRN
  Administered 2019-03-22: 12:00:00 65 mL via INTRAVENOUS

## 2019-03-22 MED ORDER — HYDROCODONE-ACETAMINOPHEN 5-325 MG PO TABS
1.0000 | ORAL_TABLET | ORAL | Status: DC | PRN
  Administered 2019-03-22 – 2019-03-23 (×2): 2 via ORAL
  Filled 2019-03-22 (×2): qty 2

## 2019-03-22 MED ORDER — OCUVITE-LUTEIN PO CAPS
1.0000 | ORAL_CAPSULE | Freq: Every day | ORAL | Status: DC
  Administered 2019-03-22 – 2019-03-24 (×3): 1 via ORAL
  Filled 2019-03-22 (×3): qty 1

## 2019-03-22 MED ORDER — HEPARIN (PORCINE) 25000 UT/250ML-% IV SOLN
1400.0000 [IU]/h | INTRAVENOUS | Status: DC
  Administered 2019-03-22 (×2): 1400 [IU]/h via INTRAVENOUS
  Filled 2019-03-22 (×2): qty 250

## 2019-03-22 MED ORDER — ONDANSETRON HCL 4 MG/2ML IJ SOLN: 4.0000 mg | Freq: Four times a day (QID) | INTRAMUSCULAR | Status: DC | PRN

## 2019-03-22 MED ORDER — FENTANYL CITRATE (PF) 100 MCG/2ML IJ SOLN
INTRAMUSCULAR | Status: AC
  Filled 2019-03-22: qty 2

## 2019-03-22 MED ORDER — ONDANSETRON HCL 4 MG PO TABS: 8.0000 mg | ORAL_TABLET | Freq: Two times a day (BID) | ORAL | Status: DC | PRN

## 2019-03-22 MED ORDER — ASPIRIN EC 81 MG PO TBEC
81.0000 mg | DELAYED_RELEASE_TABLET | Freq: Every day | ORAL | Status: DC
  Administered 2019-03-23 – 2019-03-24 (×2): 81 mg via ORAL
  Filled 2019-03-22 (×2): qty 1

## 2019-03-22 MED ORDER — DEXTROSE-NACL 5-0.9 % IV SOLN: INTRAVENOUS | Status: DC

## 2019-03-22 MED ORDER — ONDANSETRON HCL 4 MG/2ML IJ SOLN
INTRAMUSCULAR | Status: AC
  Administered 2019-03-22: 4 mg via INTRAVENOUS
  Filled 2019-03-22: qty 2

## 2019-03-22 MED ORDER — SODIUM CHLORIDE 0.9 % IV SOLN
INTRAVENOUS | Status: DC
  Administered 2019-03-22: 10:00:00 via INTRAVENOUS

## 2019-03-22 MED ORDER — HEPARIN BOLUS VIA INFUSION
4000.0000 [IU] | Freq: Once | INTRAVENOUS | Status: AC
  Administered 2019-03-22: 4000 [IU] via INTRAVENOUS
  Filled 2019-03-22: qty 4000

## 2019-03-22 MED ORDER — CLINDAMYCIN PHOSPHATE 300 MG/50ML IV SOLN: 300.0000 mg | Freq: Once | INTRAVENOUS | Status: DC

## 2019-03-22 MED ORDER — NAPROXEN 250 MG PO TABS
250.0000 mg | ORAL_TABLET | Freq: Every day | ORAL | Status: DC | PRN
  Filled 2019-03-22: qty 1

## 2019-03-22 MED ORDER — FINASTERIDE 5 MG PO TABS
5.0000 mg | ORAL_TABLET | Freq: Every day | ORAL | Status: DC
  Administered 2019-03-22 – 2019-03-24 (×3): 5 mg via ORAL
  Filled 2019-03-22 (×3): qty 1

## 2019-03-22 MED ORDER — ONDANSETRON HCL 4 MG/2ML IJ SOLN
4.0000 mg | Freq: Once | INTRAMUSCULAR | Status: AC
  Administered 2019-03-22: 07:00:00 4 mg via INTRAVENOUS

## 2019-03-22 MED ORDER — MAGNESIUM OXIDE 400 (241.3 MG) MG PO TABS
200.0000 mg | ORAL_TABLET | Freq: Every day | ORAL | Status: DC
  Administered 2019-03-22 – 2019-03-24 (×3): 200 mg via ORAL
  Filled 2019-03-22 (×3): qty 1

## 2019-03-22 MED ORDER — APIXABAN 5 MG PO TABS
5.0000 mg | ORAL_TABLET | Freq: Two times a day (BID) | ORAL | Status: DC
  Administered 2019-03-23 – 2019-03-24 (×3): 5 mg via ORAL
  Filled 2019-03-22 (×3): qty 1

## 2019-03-22 MED ORDER — DOCUSATE SODIUM 100 MG PO CAPS
100.0000 mg | ORAL_CAPSULE | Freq: Two times a day (BID) | ORAL | Status: DC
  Administered 2019-03-22 – 2019-03-24 (×4): 100 mg via ORAL
  Filled 2019-03-22 (×4): qty 1

## 2019-03-22 MED ORDER — ATORVASTATIN CALCIUM 10 MG PO TABS
10.0000 mg | ORAL_TABLET | Freq: Every day | ORAL | Status: DC
  Administered 2019-03-22 – 2019-03-24 (×3): 10 mg via ORAL
  Filled 2019-03-22 (×3): qty 1

## 2019-03-22 MED ORDER — METOPROLOL SUCCINATE ER 25 MG PO TB24
25.0000 mg | ORAL_TABLET | Freq: Every day | ORAL | Status: DC
  Administered 2019-03-22 – 2019-03-24 (×3): 25 mg via ORAL
  Filled 2019-03-22 (×3): qty 1

## 2019-03-22 MED ORDER — ONDANSETRON HCL 4 MG PO TABS: 4.0000 mg | ORAL_TABLET | Freq: Four times a day (QID) | ORAL | Status: DC | PRN

## 2019-03-22 MED ORDER — ACETAMINOPHEN 325 MG PO TABS
650.0000 mg | ORAL_TABLET | Freq: Four times a day (QID) | ORAL | Status: DC | PRN
  Administered 2019-03-23: 650 mg via ORAL
  Filled 2019-03-22: qty 2

## 2019-03-22 MED ORDER — SODIUM CHLORIDE 0.9 % IV SOLN: INTRAVENOUS | Status: DC

## 2019-03-22 MED ORDER — MORPHINE SULFATE (PF) 4 MG/ML IV SOLN
4.0000 mg | Freq: Once | INTRAVENOUS | Status: AC
  Administered 2019-03-22: 07:00:00 4 mg via INTRAVENOUS

## 2019-03-22 MED ORDER — HEPARIN SODIUM (PORCINE) 1000 UNIT/ML IJ SOLN
INTRAMUSCULAR | Status: AC
  Filled 2019-03-22: qty 1

## 2019-03-22 MED ORDER — ACETAMINOPHEN 650 MG RE SUPP
650.0000 mg | Freq: Four times a day (QID) | RECTAL | Status: DC | PRN
  Filled 2019-03-22: qty 1

## 2019-03-22 MED ORDER — MIDAZOLAM HCL 2 MG/2ML IJ SOLN
INTRAMUSCULAR | Status: DC | PRN
  Administered 2019-03-22: 1 mg via INTRAVENOUS
  Administered 2019-03-22: 2 mg via INTRAVENOUS
  Administered 2019-03-22: 1 mg via INTRAVENOUS

## 2019-03-22 MED ORDER — FAMOTIDINE 20 MG PO TABS
20.0000 mg | ORAL_TABLET | Freq: Two times a day (BID) | ORAL | Status: DC
  Administered 2019-03-22 – 2019-03-23 (×3): 20 mg via ORAL
  Filled 2019-03-22 (×2): qty 1

## 2019-03-22 MED ORDER — CLINDAMYCIN PHOSPHATE 300 MG/50ML IV SOLN
INTRAVENOUS | Status: AC
  Filled 2019-03-22: qty 50

## 2019-03-22 MED ORDER — FLEET ENEMA 7-19 GM/118ML RE ENEM: 1.0000 | ENEMA | Freq: Once | RECTAL | Status: DC | PRN

## 2019-03-22 MED ORDER — ADULT MULTIVITAMIN W/MINERALS CH
1.0000 | ORAL_TABLET | Freq: Every day | ORAL | Status: DC
  Administered 2019-03-22 – 2019-03-24 (×3): 1 via ORAL
  Filled 2019-03-22 (×3): qty 1

## 2019-03-22 MED ORDER — ISOSORBIDE DINITRATE 30 MG PO TABS
30.0000 mg | ORAL_TABLET | Freq: Every day | ORAL | Status: DC
  Administered 2019-03-22 – 2019-03-24 (×3): 30 mg via ORAL
  Filled 2019-03-22 (×3): qty 1

## 2019-03-22 MED ORDER — GABAPENTIN 300 MG PO CAPS
300.0000 mg | ORAL_CAPSULE | Freq: Every day | ORAL | Status: DC
  Administered 2019-03-22 – 2019-03-23 (×2): 300 mg via ORAL
  Filled 2019-03-22 (×2): qty 1

## 2019-03-22 MED ORDER — MIDAZOLAM HCL 5 MG/5ML IJ SOLN
INTRAMUSCULAR | Status: AC
  Filled 2019-03-22: qty 5

## 2019-03-22 MED ORDER — PROCHLORPERAZINE MALEATE 10 MG PO TABS
10.0000 mg | ORAL_TABLET | Freq: Four times a day (QID) | ORAL | Status: DC | PRN
  Filled 2019-03-22: qty 1

## 2019-03-22 SURGICAL SUPPLY — 15 items
BALLN ATG 14X6X80 (BALLOONS) ×3
BALLN ULTRVRSE 12X80X75 (BALLOONS) ×3
BALLOON ATG 14X6X80 (BALLOONS) IMPLANT
BALLOON ULTRVRSE 12X80X75 (BALLOONS) IMPLANT
CANISTER PENUMBRA ENGINE (MISCELLANEOUS) ×2 IMPLANT
CANNULA 5F STIFF (CANNULA) ×2 IMPLANT
CATH BEACON 5 .035 65 KMP TIP (CATHETERS) ×2 IMPLANT
CATH INDIGO 12 HTORQ 115 (CATHETERS) ×2 IMPLANT
DEVICE PRESTO INFLATION (MISCELLANEOUS) ×2 IMPLANT
INTRODUCER PERFORM 12 30 .038 (SHEATH) ×2 IMPLANT
PACK ANGIOGRAPHY (CUSTOM PROCEDURE TRAY) ×3 IMPLANT
SHEATH BRITE TIP 8FRX11 (SHEATH) ×2 IMPLANT
SHIELD RADPAD DADD DRAPE 4X9 (MISCELLANEOUS) ×2 IMPLANT
STENT VENOVO 16X100X80 (Permanent Stent) ×2 IMPLANT
WIRE MAGIC TORQUE 260C (WIRE) ×2 IMPLANT

## 2019-03-22 NOTE — ED Notes (Signed)
Patient transported to [facility] via stretcher

## 2019-03-22 NOTE — Op Note (Signed)
[facility]   OPERATIVE NOTE   PRE-OPERATIVE DIAGNOSIS: extensive LLE DVT  POST-OPERATIVE DIAGNOSIS: same   PROCEDURE: 1. US guidance for vascular access to left popliteal vein 2. Catheter placement into left common iliac vein from left popliteal approach 3. IVC gram and LLE venogram 4.  Catheter directed thrombolysis with 4 mg of tpa to the left iliac vein 5. Mechanical thrombectomy to left external iliac vein and common iliac vein with the penumbra cat 12 device 6. PTA of the left common and external iliac veins with 12 mm diameter by 8 cm length balloon 7. Stent placement to the left common and external iliac veins with 16 mm diameter by 10 cm length Venovo stent   SURGEON: Leotis Pain, MD  ASSISTANT(S): none  ANESTHESIA: local with moderate conscious sedation for 35 minutes using 4 mg of Versed and 100 mcg of Fentanyl  ESTIMATED BLOOD LOSS: 100 cc  FINDING(S): 1. Extensive thrombus in the left iliac venous system with extrinsic compression likely from his tumor creating a high-grade stenosis  SPECIMEN(S): none  INDICATIONS:  Patient is a 85 y.o. male who presents with phlegmasia from extensive left lower extremity DVT and bladder cancer. Patient has marked leg swelling and pain. Venous intervention is performed to reduce the symtpoms and avoid long term postphlebitic symptoms.   DESCRIPTION: After obtaining full informed written consent, the patient was brought back to the vascular suite and placed supine upon the table.Moderate conscious sedation was administered during a face to face encounter with the patient throughout the procedure with my supervision of the RN administering medicines and monitoring the patient's vital signs, pulse oximetry, telemetry and mental status throughout from the start of the procedure until the patient was taken to the recovery room. After obtaining adequate anesthesia, the patient was prepped and  draped in the standard fashion. The patient was then placed into the prone position. The left popliteal vein was then accessed under direct ultrasound guidance without difficulty with a micropuncture needle and a permanent image was recorded. I then upsized to an 12Fr sheath over a J wire. 4000 units of heparin were then given. Imaging showed extensive DVT with minimal flow in the left iliac venous system with no significant DVT in the popliteal or femoral vein. A Kumpe catheter and Magic tourque wire were then advanced into the CFV and images were performed which better showed the iliac issues. 4 mg of TPA was then instilled in the left iliac DVT and allowed to dwell.  The penumbra cat 12 device was then brought onto the field and we started thrombectomy from the left common femoral vein up through the external and common iliac veins up to the IVC.  Several passes were made with large chunks of thrombus removed, but there remained significant extrinsic compression likely from the tumor.  Angioplasty was performed with a 12 mm diameter by 8 cm length angioplasty balloon inflated to 8 atm for 1 minute.  This was done in the left external and common iliac veins.  Completion imaging showed residual greater than 70% stenosis so I elected to place a stent.  A 16 mm diameter by 10 cm length Venovo stent was then deployed in the left external and common iliac veins taking care not to go up into the IVC staying about 1 to 2 cm from the iliac vein confluence.  This was postdilated with a 14 mm balloon with excellent angiographic completion result and only about 10 to 15% residual stenosis.  I then elected to terminate the procedure. The sheath was removed and a dressing was placed. She was taken to the recovery room in stable condition having tolerated the procedure well.   COMPLICATIONS: None  CONDITION: Stable  Leotis Pain 03/22/2019 11:56 AM

## 2019-03-22 NOTE — Progress Notes (Addendum)
ANTICOAGULATION CONSULT NOTE - Initial Consult  Pharmacy Consult for heparin Indication: DVT  Allergies  Allergen Reactions  . Augmentin [Amoxicillin-Pot Clavulanate]     Tongue swelling 2 days after taking  . Ciprofloxacin Diarrhea  . Lisinopril Other (See Comments)    Unknown   . Penicillins Swelling    unknwon reaction.  tolerates amoxicillin Has patient had a PCN reaction causing immediate rash, facial/tongue/throat swelling, SOB or lightheadedness with hypotension: No Has patient had a PCN reaction causing severe rash involving mucus membranes or skin necrosis: No Has patient had a PCN reaction that required hospitalization: No Has patient had a PCN reaction occurring within the last 10 years: Yes If all of the above answers are "NO", then may proceed with Cephalosporin use.   . Bactrim [Sulfamethoxazole-Trimethoprim] Rash  . Sulfa Antibiotics Itching and Rash    Patient Measurements: Height: 6\' 1"  (185.4 cm) Weight: 239 lb (108.4 kg) IBW/kg (Calculated) : 79.9 Heparin Dosing Weight: 91.3 kg  Vital Signs: Temp: 97.9 F (36.6 C) (07/30 0644) Temp Source: Oral (07/30 0644) BP: 131/60 (07/30 0644) Pulse Rate: 57 (07/30 0644)  Labs: Recent Labs    03/20/19 0800 03/22/19 0651  HGB 11.1* 11.3*  HCT 33.6* 35.3*  PLT 346 457*  APTT  --  26  LABPROT  --  12.8  INR  --  1.0  CREATININE 1.84*  --     Estimated Creatinine Clearance: 37.9 mL/min (A) (by C-G formula based on SCr of 1.84 mg/dL (H)).   Medical History: Past Medical History:  Diagnosis Date  . Arteriosclerosis of coronary artery 08/26/2011   Overview:      a.  1999 PCI of the mid LAD with stent.      b.  2002 Cath [place]: EF 56%. RCA-distal 25/25%. Left main-50% ostial.  Left circumflex-25% OM2.  LAD-25% proximal.  75% mid.  25/25% distal. 75% D1.      c.  07/2011 PCI of RCA with DES.[place]   . Bleeding gastrointestinal 08/26/2011   Overview:      a.  Chronic abdominal pain, present improving.      b.   Duodenitis and gastritis by EGD in 2000.      c.  Pylori in 2000, treated.      d.  Gastroesophageal reflux disease.      e.  Diverticular disease.    . BP (high blood pressure) 08/26/2011  . Closed dislocation of right ankle 09/10/2016   Successfully reduced in ER 09/10/2016  . Diffuse large B-cell lymphoma ([place]) 2015   chemo tx's  . GERD (gastroesophageal reflux disease)   . Heart disease   . Helicobacter pylori infection 06/18/2015   by EGD RX 08/18/1999   . History of adenomatous polyp of colon 06/18/2015  . HOH (hard of hearing)    Bilateral Hearing  Aids  . Malleolar fracture (Right) 09/10/2016    Medications:  Scheduled:    Assessment: Patient here from home w/ leg swelling and pain since 0600, swollen purple in color, not palpable. Patient not on any anticoagulation PTA. US DVT pending. Patient being started on heparin drip for possible DVT.  Goal of Therapy:  Heparin level 0.3-0.7 units/ml Monitor platelets by anticoagulation protocol: Yes   Plan:  Will bolus w/ heparin 4000 units IV x 1 Will start heparin at 1400 units/hr. Baseline labs WNL Will check anti-Xa @ 1300. Will monitor daily CBC's and adjust per anti-Xa.  Tobie Lords, PharmD, BCPS Clinical Pharmacist 03/22/2019,7:23 AM

## 2019-03-22 NOTE — ED Provider Notes (Signed)
[facility] Emergency Department Provider Note       Time seen: ----------------------------------------- 7:04 AM on 03/22/2019 -----------------------------------------   I have reviewed the triage vital signs and the nursing notes.  HISTORY   Chief Complaint Leg Swelling    HPI Bryan Nelson is a 85 y.o. male with a history of coronary artery disease, gastrointestinal bleeding, B-cell lymphoma, GERD, heart disease, bladder cancer who presents to the ED for acute onset left leg pain.  Patient describes severe left leg pain and purple discoloration beginning at 6 AM.  Patient states he woke up normally and then had acute onset at 6 AM.  Patient has difficulty wiggling his toes on the left foot, has normal sensation.  Patient currently takes Plavix due to cardiac stent placement in 1998.  Past Medical History:  Diagnosis Date  . Arteriosclerosis of coronary artery 08/26/2011   Overview:      a.  1999 PCI of the mid LAD with stent.      b.  2002 Cath [place]: EF 56%. RCA-distal 25/25%. Left main-50% ostial.  Left circumflex-25% OM2.  LAD-25% proximal.  75% mid.  25/25% distal. 75% D1.      c.  07/2011 PCI of RCA with DES.   . Bleeding gastrointestinal 08/26/2011   Overview:      a.  Chronic abdominal pain, present improving.      b.  Duodenitis and gastritis by EGD in 2000.      c.  Pylori in 2000, treated.      d.  Gastroesophageal reflux disease.      e.  Diverticular disease.    . BP (high blood pressure) 08/26/2011  . Closed dislocation of right ankle 09/10/2016   Successfully reduced in ER 09/10/2016  . Diffuse large B-cell lymphoma ([place]) 2015   chemo tx's  . GERD (gastroesophageal reflux disease)   . Heart disease   . Helicobacter pylori infection 06/18/2015   by EGD RX 08/18/1999   . History of adenomatous polyp of colon 06/18/2015  . HOH (hard of hearing)    Bilateral Hearing  Aids  . Malleolar fracture (Right) 09/10/2016    Patient Active  Problem List   Diagnosis Date Noted  . Angioedema 03/16/2019  . Goals of care, counseling/discussion 03/03/2019  . Urothelial carcinoma ([place]) 03/03/2019  . BPH (benign prostatic hyperplasia) 03/02/2017  . Recurrent UTI 12/21/2015  . Urinary retention 09/24/2015  . Narrowing of intervertebral disc space 06/18/2015  . Diverticulitis 06/18/2015  . Esophageal reflux 06/18/2015  . Familial multiple lipoprotein-type hyperlipidemia 06/18/2015  . Insomnia 06/18/2015  . Vitamin D deficiency 06/18/2015  . DLBCL (diffuse large B cell lymphoma) ([place]) 04/10/2014  . ED (erectile dysfunction) of organic origin 08/03/2012  . Incomplete bladder emptying 08/03/2012  . Breath shortness 12/09/2011  . Arteriosclerosis of coronary artery 08/26/2011  . Bleeding gastrointestinal 08/26/2011  . HLD (hyperlipidemia) 08/26/2011  . Essential hypertension 08/26/2011  . Peripheral nerve disease 08/26/2011    Past Surgical History:  Procedure Laterality Date  . ANGIOPLASTY    . CATARACT EXTRACTION    . COLONOSCOPY  2016  . CORONARY ANGIOPLASTY    . CYSTOSCOPY WITH STENT PLACEMENT Left 03/17/2019   Procedure: CYSTOSCOPY WITH STENT PLACEMENT;  Surgeon: Festus Aloe, MD;  Location: [facility] ORS;  Service: Urology;  Laterality: Left;  . EYE SURGERY Bilateral    Cataract Extraction with IOL  . GREEN LIGHT LASER TURP (TRANSURETHRAL RESECTION OF PROSTATE N/A 04/16/2015   Procedure: GREEN LIGHT LASER TURP (  TRANSURETHRAL RESECTION OF PROSTATE WITH BLADDER BIOPSY;  Surgeon: Collier Flowers, MD;  Location: [facility] ORS;  Service: Urology;  Laterality: N/A;  . heart stent placement     1998, 2000, 2012  . TRANSURETHRAL RESECTION OF PROSTATE N/A 03/02/2017   Procedure: TRANSURETHRAL RESECTION OF THE PROSTATE (TURP);  Surgeon: Hollice Espy, MD;  Location: [facility] ORS;  Service: Urology;  Laterality: N/A;    Allergies Augmentin [amoxicillin-pot clavulanate], Ciprofloxacin, Lisinopril, Penicillins, Bactrim  [sulfamethoxazole-trimethoprim], and Sulfa antibiotics  Social History Social History   Tobacco Use  . Smoking status: Former Smoker    Packs/day: 1.50    Years: 12.00    Pack years: 18.00    Types: Cigarettes    Quit date: 08/24/1971    Years since quitting: 47.6  . Smokeless tobacco: Never Used  Substance Use Topics  . Alcohol use: No    Alcohol/week: 0.0 standard drinks  . Drug use: No    Review of Systems Constitutional: Negative for fever. Cardiovascular: Negative for chest pain. Respiratory: Negative for shortness of breath. Gastrointestinal: Negative for abdominal pain, vomiting and diarrhea. Musculoskeletal: Positive for left leg pain Skin: Positive for purple discoloration of the left leg Neurological: Negative for headaches, focal weakness or numbness.  All systems negative/normal/unremarkable except as stated in the HPI  ____________________________________________   PHYSICAL EXAM:  VITAL SIGNS: ED Triage Vitals  Enc Vitals Group     BP 03/22/19 0644 131/60     Pulse Rate 03/22/19 0644 (!) 57     Resp 03/22/19 0644 18     Temp 03/22/19 0644 97.9 F (36.6 C)     Temp Source 03/22/19 0644 Oral     SpO2 03/22/19 0639 98 %     Weight 03/22/19 0645 239 lb (108.4 kg)     Height 03/22/19 0645 6\' 1"  (1.854 m)     Head Circumference --      Peak Flow --      Pain Score 03/22/19 0645 10     Pain Loc --      Pain Edu? --      Excl. in [place]? --    Constitutional: Alert and oriented. Well appearing and in no distress. Eyes: Conjunctivae are normal. Normal extraocular movements. ENT      Head: Normocephalic and atraumatic.      Nose: No congestion/rhinnorhea.      Mouth/Throat: Mucous membranes are moist.      Neck: No stridor. Cardiovascular: Normal rate, regular rhythm. No murmurs, rubs, or gallops.  He has dopplerable pulses at dorsalis pedis, posterior tibial, popliteal and proximal femoral. Respiratory: Normal respiratory effort without tachypnea nor  retractions. Breath sounds are clear and equal bilaterally. No wheezes/rales/rhonchi. Gastrointestinal: Soft and nontender. Normal bowel sounds Musculoskeletal: Edema and swelling of the left lower extremity greater than right.  There is purple discoloration beginning at around the left knee extending down through the left foot.   Neurologic:  Normal speech and language. No gross focal neurologic deficits are appreciated.  Skin: Purple discoloration of the skin from the left knee down Psychiatric: Mood and affect are normal. Speech and behavior are normal.  ____________________________________________  EKG: Interpreted by me.  Sinus rhythm rate of 57 bpm, normal PR interval, normal QRS, normal QT  ____________________________________________  ED COURSE:  As part of my medical decision making, I reviewed the following data within the [place] History obtained from family if available, nursing notes, old chart and ekg, as well as notes from prior ED visits.  Patient presented for left leg pain and swelling, we will assess with labs and imaging as indicated at this time.   Procedures  Bryan Nelson was evaluated in Emergency Department on 03/22/2019 for the symptoms described in the history of present illness. He was evaluated in the context of the global COVID-19 pandemic, which necessitated consideration that the patient might be at risk for infection with the SARS-CoV-2 virus that causes COVID-19. Institutional protocols and algorithms that pertain to the evaluation of patients at risk for COVID-19 are in a state of rapid change based on information released by regulatory bodies including the CDC and federal and state organizations. These policies and algorithms were followed during the patient's care in the ED.  ____________________________________________   LABS (pertinent positives/negatives)  Labs Reviewed  CBC WITH DIFFERENTIAL/PLATELET - Abnormal; Notable for the  following components:      Result Value   WBC 15.8 (*)    RBC 4.08 (*)    Hemoglobin 11.3 (*)    HCT 35.3 (*)    Platelets 457 (*)    Neutro Abs 12.4 (*)    Monocytes Absolute 1.3 (*)    Abs Immature Granulocytes 0.09 (*)    All other components within normal limits  COMPREHENSIVE METABOLIC PANEL - Abnormal; Notable for the following components:   Glucose, Bld 125 (*)    BUN 54 (*)    Creatinine, Ser 2.46 (*)    GFR calc non Af Amer 23 (*)    GFR calc Af Amer 27 (*)    All other components within normal limits  APTT  PROTIME-INR  HEPARIN LEVEL (UNFRACTIONATED)  TROPONIN I (HIGH SENSITIVITY)    RADIOLOGY Images were viewed by me  Left lower extremity ultrasound Extensive left lower extremity DVT is noted ____________________________________________   CRITICAL CARE Performed by: Laurence Aly   Total critical care time: 30 minutes  Critical care time was exclusive of separately billable procedures and treating other patients.  Critical care was necessary to treat or prevent imminent or life-threatening deterioration.  Critical care was time spent personally by me on the following activities: development of treatment plan with patient and/or surrogate as well as nursing, discussions with consultants, evaluation of patient's response to treatment, examination of patient, obtaining history from patient or surrogate, ordering and performing treatments and interventions, ordering and review of laboratory studies, ordering and review of radiographic studies, pulse oximetry and re-evaluation of patient's condition.   DIFFERENTIAL DIAGNOSIS   DVT, phlegmasia, ischemia, arterial embolism  FINAL ASSESSMENT AND PLAN  Phlegmasia cerulea dolens   Plan: The patient had presented for left lower extremity swelling and purple discoloration with severe pain at 6 AM. Patient's labs did reveal some acute on chronic renal insufficiency. Patient's imaging was positive for DVT in  the left lower extremity.  Given the extent of his pain and swelling with cyanosis this indicates phlegmasia.  He was initially placed on heparin and given multiple doses of IV narcotics for pain.  I will discuss with vascular surgery for thrombectomy.   Laurence Aly, MD    Note: This note was generated in part or whole with voice recognition software. Voice recognition is usually quite accurate but there are transcription errors that can and very often do occur. I apologize for any typographical errors that were not detected and corrected.     Earleen Newport, MD 03/22/19 [phone]

## 2019-03-22 NOTE — Care Management Obs Status (Signed)
[facility] NOTIFICATION   Patient Details  Name: Bryan Nelson MRN: [id number] Date of Birth: 11/03/1931   Medicare Observation Status Notification Given:  Yes    Beverly Sessions, RN 03/22/2019, 3:55 PM

## 2019-03-22 NOTE — Progress Notes (Signed)
ANTICOAGULATION CONSULT NOTE  Pharmacy Consult for heparin Indication: chest pain/ACS  Patient Measurements: Height: 6\' 1"  (185.4 cm) Weight: 239 lb (108.4 kg) IBW/kg (Calculated) : 79.9 Heparin Dosing Weight: 91.3 kg  Vital Signs: Temp: 97.9 F (36.6 C) (07/30 0644) Temp Source: Oral (07/30 0644) BP: 151/77 (07/30 1202) Pulse Rate: 72 (07/30 1202)  Labs: Recent Labs    03/20/19 0800 03/22/19 0651  HGB 11.1* 11.3*  HCT 33.6* 35.3*  PLT 346 457*  APTT  --  26  LABPROT  --  12.8  INR  --  1.0  CREATININE 1.84* 2.46*  TROPONINIHS  --  15    Estimated Creatinine Clearance: 28.4 mL/min (A) (by C-G formula based on SCr of 2.46 mg/dL (H)).   Medical History: Past Medical History:  Diagnosis Date  . Arteriosclerosis of coronary artery 08/26/2011   Overview:      a.  1999 PCI of the mid LAD with stent.      b.  2002 Cath [place]: EF 56%. RCA-distal 25/25%. Left main-50% ostial.  Left circumflex-25% OM2.  LAD-25% proximal.  75% mid.  25/25% distal. 75% D1.      c.  07/2011 PCI of RCA with DES.   . Bleeding gastrointestinal 08/26/2011   Overview:      a.  Chronic abdominal pain, present improving.      b.  Duodenitis and gastritis by EGD in 2000.      c.  Pylori in 2000, treated.      d.  Gastroesophageal reflux disease.      e.  Diverticular disease.    . BP (high blood pressure) 08/26/2011  . Closed dislocation of right ankle 09/10/2016   Successfully reduced in ER 09/10/2016  . Diffuse large B-cell lymphoma ([place]) 2015   chemo tx's  . GERD (gastroesophageal reflux disease)   . Heart disease   . Helicobacter pylori infection 06/18/2015   by EGD RX 08/18/1999   . History of adenomatous polyp of colon 06/18/2015  . HOH (hard of hearing)    Bilateral Hearing  Aids  . Malleolar fracture (Right) 09/10/2016    Medications:  Scheduled:  . [START ON 03/23/2019] apixaban  5 mg Oral BID  . aspirin EC  81 mg Oral Daily  . atorvastatin  10 mg Oral Daily  . docusate sodium  100  mg Oral BID  . doxycycline  100 mg Oral BID  . famotidine  20 mg Oral BID  . fentaNYL      . finasteride  5 mg Oral Daily  . gabapentin  300 mg Oral QHS  . heparin      . hydrochlorothiazide  25 mg Oral Daily  . isosorbide dinitrate  30 mg Oral Daily  . Magnesium  250 mg Oral Daily  . metoprolol succinate  25 mg Oral Daily  . midazolam      . Multivitamin Adult   Oral Daily  . psyllium  1 packet Oral Daily  . Vision Formula 2   Oral Daily  . Vitamin D3  1,000 Units Oral Daily    Assessment: Patient here from home w/ leg swelling and pain since 0600, swollen purple in color, not palpable. Patient not on any anticoagulation PTA.   Vascular Ultrasound (03/22/19): 1. Examination is positive for extensive occlusive DVT extending from the left common femoral vein through the left tibial veins. 2. Occlusive superficial thrombophlebitis involving the greater saphenous vein at the level of the mid thigh.  Goal of Therapy:  Heparin  level 0.3-0.7 units/ml Monitor platelets by anticoagulation protocol: Yes   Heparin Course: 7/30 am initiation:  4000 units IV bolus, then 1400 units/hr 7/30 1124: he received 4000 units of heparin and 4 mg alteplase during an angioplasty. Heparin infusion was stopped prior to the procedure (heparin level was not cancelled prior to these actions)  Plan:   Continue heparin at 1400 units/hr  Check HL at midnight  Dr Lucky Cowboy is starting apixaban at 1000 am 7/31: we will plan on stopping the heparin drip approximately one hour before apixaban administration  I spoke with Cloyde Reams, RN who will be here tomorrow about the plan  CBC in am  Vallery Sa, PharmD Clinical Pharmacist 03/22/2019,1:11 PM

## 2019-03-22 NOTE — OR Nursing (Signed)
Per Dr Lucky Cowboy pt to be on bedrest 1 hr post procedure then elevate left leg as much as possible.

## 2019-03-22 NOTE — ED Triage Notes (Addendum)
Pt arrives to ED via [facility] from home with c/o leg swelling and pain since 6am. Pt arrives with left leg obviously swollen and purplish in color from the knee down; sensation and motor function intact, unable to palpate pulse in left foot. Pt is A&O, in NAD; RR even, regular and unlabored. Pt is currently taking daily Plavix d/t cardiac stent placement in 1998.

## 2019-03-22 NOTE — Consult Note (Signed)
[facility] Vascular Consult Note  MRN : [id number]  Bryan Nelson is a 85 y.o. (August 28, 1931) male who presents with chief complaint of  Chief Complaint  Patient presents with  . Leg Swelling   History of Present Illness:  The patient is an 85 year old male with a past medical history of diffuse large B-cell lymphoma, urothelial cancer, coronary artery disease, hyperlipidemia, hypertension, diverticulitis, GERD with a chief complaint of left lower extremity pain and swelling.  Patient endorses a history of waking up early this a.m. with acute onset of sharp progressively worsening left lower extremity pain.  The patient describes the pain is radiating up and down his left lower extremity.  The pain is associated with swelling.  The patient notes that it was difficult to walk.  His swelling and discomfort progressed which prompted him to seek medical attention via EMS.  He denies any recent surgery or trauma, prolonged immobility or history of DVT/PE.  He denies any fever, nausea vomiting.  He denies any shortness of breath or chest pain.  Vascular Ultrasound (03/22/19): 1. Examination is positive for extensive occlusive DVT extending from the left common femoral vein through the left tibial veins. 2. Occlusive superficial thrombophlebitis involving the greater saphenous vein at the level of the mid thigh.  Vascular Surgery was consulted by Dr. [name] for further recommendations Current Facility-Administered Medications  Medication Dose Route Frequency Provider Last Rate Last Dose  . 0.9 %  sodium chloride infusion   Intravenous Continuous Taccara Bushnell A, PA-C      . heparin ADULT infusion 100 units/mL (25000 units/251mL sodium chloride 0.45%)  1,400 Units/hr Intravenous Continuous Earleen Newport, MD 14 mL/hr at 03/22/19 0721 1,400 Units/hr at 03/22/19 0721   Current Outpatient Medications  Medication Sig Dispense Refill  . ALPRAZolam (XANAX) 0.5 MG  tablet Take 1 tablet (0.5 mg total) by mouth at bedtime as needed for anxiety. 30 tablet 1  . atorvastatin (LIPITOR) 10 MG tablet Take 1 tablet (10 mg total) by mouth daily. 30 tablet 11  . Cholecalciferol (VITAMIN D3) 1000 units CAPS Take 1,000 Units by mouth daily.    . clopidogrel (PLAVIX) 75 MG tablet Take 1 tablet (75 mg total) by mouth daily. 90 tablet 4  . doxycycline (ADOXA) 100 MG tablet Take 1 tablet (100 mg total) by mouth 2 (two) times daily for 5 days. 10 tablet 0  . famotidine (PEPCID) 20 MG tablet Take 1 tablet (20 mg total) by mouth 2 (two) times daily. 180 tablet 3  . finasteride (PROSCAR) 5 MG tablet Take 1 tablet (5 mg total) by mouth daily. 90 tablet 3  . gabapentin (NEURONTIN) 300 MG capsule Take 1 capsule (300 mg total) by mouth at bedtime. Do not take if you take temazepam 30 capsule 6  . hydrochlorothiazide (HYDRODIURIL) 25 MG tablet Take 1 tablet (25 mg total) by mouth daily. 30 tablet 0  . isosorbide dinitrate (ISORDIL) 30 MG tablet Take 30 mg by mouth daily.    . Magnesium 250 MG TABS Take 250 mg by mouth daily.    . metoprolol succinate (TOPROL-XL) 25 MG 24 hr tablet Take 1 tablet (25 mg total) by mouth daily. 90 tablet 4  . Multiple Vitamins-Minerals (MULTIVITAMIN ADULT PO) Take by mouth daily.    . Multiple Vitamins-Minerals (VISION FORMULA 2 PO) Take by mouth daily.    . naproxen sodium (ALEVE) 220 MG tablet Take 220 mg by mouth daily as needed.    . ondansetron (ZOFRAN) 8  MG tablet Take 1 tablet (8 mg total) by mouth 2 (two) times daily as needed for nausea or vomiting. 20 tablet 3  . prochlorperazine (COMPAZINE) 10 MG tablet Take 1 tablet (10 mg total) by mouth every 6 (six) hours as needed for nausea or vomiting. 30 tablet 3  . psyllium (METAMUCIL) 58.6 % packet Take 1 packet by mouth daily.     Past Medical History:  Diagnosis Date  . Arteriosclerosis of coronary artery 08/26/2011   Overview:      a.  1999 PCI of the mid LAD with stent.      b.  2002 Cath  [place]: EF 56%. RCA-distal 25/25%. Left main-50% ostial.  Left circumflex-25% OM2.  LAD-25% proximal.  75% mid.  25/25% distal. 75% D1.      c.  07/2011 PCI of RCA with DES.[place]   . Bleeding gastrointestinal 08/26/2011   Overview:      a.  Chronic abdominal pain, present improving.      b.  Duodenitis and gastritis by EGD in 2000.      c.  Pylori in 2000, treated.      d.  Gastroesophageal reflux disease.      e.  Diverticular disease.    . BP (high blood pressure) 08/26/2011  . Closed dislocation of right ankle 09/10/2016   Successfully reduced in ER 09/10/2016  . Diffuse large B-cell lymphoma ([place]) 2015   chemo tx's  . GERD (gastroesophageal reflux disease)   . Heart disease   . Helicobacter pylori infection 06/18/2015   by EGD RX 08/18/1999   . History of adenomatous polyp of colon 06/18/2015  . HOH (hard of hearing)    Bilateral Hearing  Aids  . Malleolar fracture (Right) 09/10/2016   Past Surgical History:  Procedure Laterality Date  . ANGIOPLASTY    . CATARACT EXTRACTION    . COLONOSCOPY  2016  . CORONARY ANGIOPLASTY    . CYSTOSCOPY WITH STENT PLACEMENT Left 03/17/2019   Procedure: CYSTOSCOPY WITH STENT PLACEMENT;  Surgeon: Festus Aloe, MD;  Location: [facility] ORS;  Service: Urology;  Laterality: Left;  . EYE SURGERY Bilateral    Cataract Extraction with IOL  . GREEN LIGHT LASER TURP (TRANSURETHRAL RESECTION OF PROSTATE N/A 04/16/2015   Procedure: GREEN LIGHT LASER TURP (TRANSURETHRAL RESECTION OF PROSTATE WITH BLADDER BIOPSY;  Surgeon: Collier Flowers, MD;  Location: [facility] ORS;  Service: Urology;  Laterality: N/A;  . heart stent placement     1998, 2000, 2012  . TRANSURETHRAL RESECTION OF PROSTATE N/A 03/02/2017   Procedure: TRANSURETHRAL RESECTION OF THE PROSTATE (TURP);  Surgeon: Hollice Espy, MD;  Location: [facility] ORS;  Service: Urology;  Laterality: N/A;   Social History Social History   Tobacco Use  . Smoking status: Former Smoker    Packs/day: 1.50    Years: 12.00     Pack years: 18.00    Types: Cigarettes    Quit date: 08/24/1971    Years since quitting: 47.6  . Smokeless tobacco: Never Used  Substance Use Topics  . Alcohol use: No    Alcohol/week: 0.0 standard drinks  . Drug use: No   Family History Family History  Problem Relation Age of Onset  . Osteoporosis Mother   . Alzheimer's disease Father   . Kidney disease Neg Hx   . Prostate cancer Neg Hx   . Bladder Cancer Neg Hx   . Kidney cancer Neg Hx   Denies family history of peripheral artery disease, renal disease or bleeding/clotting disorders.  Allergies  Allergen Reactions  . Augmentin [Amoxicillin-Pot Clavulanate]     Tongue swelling 2 days after taking  . Ciprofloxacin Diarrhea  . Lisinopril Other (See Comments)    Unknown   . Penicillins Swelling    unknwon reaction.  tolerates amoxicillin Has patient had a PCN reaction causing immediate rash, facial/tongue/throat swelling, SOB or lightheadedness with hypotension: No Has patient had a PCN reaction causing severe rash involving mucus membranes or skin necrosis: No Has patient had a PCN reaction that required hospitalization: No Has patient had a PCN reaction occurring within the last 10 years: Yes If all of the above answers are "NO", then may proceed with Cephalosporin use.   . Bactrim [Sulfamethoxazole-Trimethoprim] Rash  . Sulfa Antibiotics Itching and Rash   REVIEW OF SYSTEMS (Negative unless checked)  Constitutional: [] Weight loss  [] Fever  [] Chills Cardiac: [] Chest pain   [] Chest pressure   [] Palpitations   [] Shortness of breath when laying flat   [] Shortness of breath at rest   [] Shortness of breath with exertion. Vascular:  [x] Pain in legs with walking   [x] Pain in legs at rest   [] Pain in legs when laying flat   [] Claudication   [] Pain in feet when walking  [] Pain in feet at rest  [] Pain in feet when laying flat   [] History of DVT   [] Phlebitis   [x] Swelling in legs   [] Varicose veins   [] Non-healing ulcers Pulmonary:    [] Uses home oxygen   [] Productive cough   [] Hemoptysis   [] Wheeze  [] COPD   [] Asthma Neurologic:  [] Dizziness  [] Blackouts   [] Seizures   [] History of stroke   [] History of TIA  [] Aphasia   [] Temporary blindness   [] Dysphagia   [] Weakness or numbness in arms   [] Weakness or numbness in legs Musculoskeletal:  [] Arthritis   [] Joint swelling   [] Joint pain   [] Low back pain Hematologic:  [] Easy bruising  [] Easy bleeding   [] Hypercoagulable state   [] Anemic  [] Hepatitis Gastrointestinal:  [] Blood in stool   [] Vomiting blood  [] Gastroesophageal reflux/heartburn   [] Difficulty swallowing. Genitourinary:  [x] Chronic kidney disease   [] Difficult urination  [] Frequent urination  [] Burning with urination   [] Blood in urine Skin:  [] Rashes   [] Ulcers   [] Wounds Psychological:  [] History of anxiety   []  History of major depression.  Physical Examination  Vitals:   03/22/19 0700 03/22/19 0723 03/22/19 0830 03/22/19 0900  BP: (!) 106/49 (!) 112/52 139/65 (!) 144/64  Pulse: (!) 56 (!) 57 62 (!) 57  Resp: 14 13 12 10   Temp:      TempSrc:      SpO2: 96% 97% 98% 100%  Weight:      Height:       Body mass index is 31.53 kg/m. Gen:  WD/WN, NAD Head: /AT, No temporalis wasting. Prominent temp pulse not noted. Ear/Nose/Throat: Hearing grossly intact, nares w/o erythema or drainage, oropharynx w/o Erythema/Exudate Eyes: Sclera non-icteric, conjunctiva clear Neck: Trachea midline.  No JVD.  Pulmonary:  Good air movement, respirations not labored, equal bilaterally.  Cardiac: RRR, normal S1, S2. Vascular:  Vessel Right Left  Radial Palpable Palpable  Ulnar Palpable Palpable  Brachial Palpable Palpable  Carotid Palpable, without bruit Palpable, without bruit  Aorta Not palpable N/A  Femoral Palpable Palpable  Popliteal Palpable Palpable  PT Palpable Non-Palpable  DP Palpable Non-Palpable   Left Lower Extremity: Thigh thigh. Calf tight.  Moderate to severe edema extending throughout the left lower  extremity.  Extremity is warm however transitions  and becomes cooler distally at the foot.  There is a mild appearance to the foot.  Unable to palpate pedal pulses due to edema.  Motor/sensory is intact.  Tender to palpation throughout the left lower extremity.  Gastrointestinal: soft, non-tender/non-distended. No guarding/reflex.  Musculoskeletal: M/S 5/5 throughout.   Neurologic: Sensation grossly intact in extremities.  Symmetrical.  Speech is fluent. Motor exam as listed above. Psychiatric: Judgment intact, Mood & affect appropriate for pt's clinical situation. Dermatologic: No rashes or ulcers noted.  No cellulitis or open wounds. Lymph : No Cervical, Axillary, or Inguinal lymphadenopathy.  CBC Lab Results  Component Value Date   WBC 15.8 (H) 03/22/2019   HGB 11.3 (L) 03/22/2019   HCT 35.3 (L) 03/22/2019   MCV 86.5 03/22/2019   PLT 457 (H) 03/22/2019   BMET    Component Value Date/Time   NA 141 03/22/2019 0651   NA 140 11/09/2018 0908   NA 144 09/16/2014 0928   K 4.6 03/22/2019 0651   K 3.5 09/16/2014 0928   CL 105 03/22/2019 0651   CL 105 09/16/2014 0928   CO2 23 03/22/2019 0651   CO2 31 09/16/2014 0928   GLUCOSE 125 (H) 03/22/2019 0651   GLUCOSE 106 (H) 09/16/2014 0928   BUN 54 (H) 03/22/2019 0651   BUN 19 11/09/2018 0908   BUN 17 09/16/2014 0928   CREATININE 2.46 (H) 03/22/2019 0651   CREATININE 1.01 09/16/2014 0928   CALCIUM 9.6 03/22/2019 0651   CALCIUM 8.4 (L) 09/16/2014 0928   GFRNONAA 23 (L) 03/22/2019 0651   GFRNONAA >60 09/16/2014 0928   GFRNONAA >60 04/30/2014 1121   GFRAA 27 (L) 03/22/2019 0651   GFRAA >60 09/16/2014 0928   GFRAA >60 04/30/2014 1121   Estimated Creatinine Clearance: 28.4 mL/min (A) (by C-G formula based on SCr of 2.46 mg/dL (H)).  COAG Lab Results  Component Value Date   INR 1.0 03/22/2019   INR 0.9 02/21/2019   INR 0.9 05/27/2014   Radiology Ct Abdomen Pelvis Wo Contrast  Result Date: 03/01/2019 CLINICAL DATA:  Progressive  left-sided abdominal pain. Invasive high-grade urothelial carcinoma with squamous differentiation. EXAM: CT ABDOMEN AND PELVIS WITHOUT CONTRAST TECHNIQUE: Multidetector CT imaging of the abdomen and pelvis was performed following the standard protocol without IV contrast. COMPARISON:  CT scan dated 02/01/2019 FINDINGS: Lower chest: Aortic atherosclerosis. Extensive coronary artery calcifications. Heart size is normal. Minimal atelectasis at the left lung base. No effusions. Hepatobiliary: No focal liver abnormality is seen. No gallstones, gallbladder wall thickening, or biliary dilatation. Pancreas: Unremarkable. No pancreatic ductal dilatation or surrounding inflammatory changes. Spleen: Normal in size without focal abnormality. Adrenals/Urinary Tract: There has been progression of the irregular mass in the left side of the pelvis which probably arises from the superior aspect of a left-sided bladder diverticulum. The mass now measures approximately 4.2 x 4.2 x 4.0 cm. There is persistent left hydronephrosis due to distal ureteral obstruction by the mass. This is unchanged. There is slight soft tissue stranding from the mass to the left superolateral aspect of the dome of the bladder, probably representing tumor, new since the prior exam. There is a new nodular extension of the mass anteriorly seen on image 73 of series 2, 16 mm in diameter. There is new soft tissue stranding extending to the sigmoid portion of the colon best seen on images 76 and 77 of series 2. The mass also extends into the left psoas muscle on image 71 of series 2. Stomach/Bowel: There are few diverticula in the  sigmoid colon. Other than the tumor stranding toward the sigmoid colon as described above, the bowel appears normal. Appendix is normal. Vascular/Lymphatic: Extensive aortic atherosclerosis. Reproductive: Prostate is unremarkable. Other: No free air or free fluid or abdominal wall hernia. Musculoskeletal: No acute abnormalities.  Multilevel degenerative disc disease. Old anterior wedge deformity of T12. IMPRESSION: 1. There has been progression of the irregular mass in the left side of the pelvis which probably arises from the superior aspect of a left-sided bladder diverticulum. The mass now measures approximately 4.2 x 4.2 x 4.0 cm. The mass now has soft tissue stranding to the left psoas muscle, sigmoid portion of the colon, and the dome of the bladder. 2. Persistent left hydronephrosis due to distal ureteral obstruction by the mass. 3. Aortic atherosclerosis and coronary artery calcifications. 4. Sigmoid diverticulosis without diverticulitis. 5. Old anterior wedge deformity of T12. Aortic Atherosclerosis (ICD10-I70.0). Electronically Signed   By: Lorriane Shire M.D.   On: 03/01/2019 07:30   US Venous Img Lower Unilateral Left  Result Date: 03/22/2019 CLINICAL DATA:  Left lower extremity pain and edema since 6 a.m. this morning. Evaluate for DVT. EXAM: LEFT LOWER EXTREMITY VENOUS DOPPLER ULTRASOUND TECHNIQUE: Gray-scale sonography with graded compression, as well as color Doppler and duplex ultrasound were performed to evaluate the lower extremity deep venous systems from the level of the common femoral vein and including the common femoral, femoral, profunda femoral, popliteal and calf veins including the posterior tibial, peroneal and gastrocnemius veins when visible. The superficial great saphenous vein was also interrogated. Spectral Doppler was utilized to evaluate flow at rest and with distal augmentation maneuvers in the common femoral, femoral and popliteal veins. COMPARISON:  None. FINDINGS: Contralateral Common Femoral Vein: Respiratory phasicity is normal and symmetric with the symptomatic side. No evidence of thrombus. Normal compressibility. There is hypoechoic occlusive thrombus within the left common femoral vein (image 7). There is a minimal amount of nonocclusive thrombus involving the proximal aspect of the greater  saphenous vein extending to the saphenofemoral junction (image 10). There is hypoechoic occlusive SVT within the greater saphenous vein at the level the mid thigh (image 34). There is hypoechoic occlusive thrombus within the imaged portions the left deep femoral vein (image 13). There is hypoechoic occlusive thrombus involving the proximal (image 16), mid (image 19) and distal (image 22) aspects of the femoral vein, extending to involve the proximal (image 25) and distal (image 28) aspects of the popliteal vein extending to involve both paired posterior tibial veins (image 31 and both paired peroneal veins (image 33). Other Findings:  None. IMPRESSION: 1. Examination is positive for extensive occlusive DVT extending from the left common femoral vein through the left tibial veins. 2. Occlusive superficial thrombophlebitis involving the greater saphenous vein at the level of the mid thigh. Electronically Signed   By: Sandi Mariscal M.D.   On: 03/22/2019 08:32   Ct Biopsy  Result Date: 02/21/2019 INDICATION: Left pelvic sidewall enlarging mass. Remote history of lymphoma and recently treated for diverticulitis. EXAM: CT-GUIDED BIOPSY LEFT PELVIC SIDEWALL MASS MEDICATIONS: 1% LIDOCAINE LOCAL ANESTHESIA/SEDATION: 2.0 mg IV Versed; 50 mcg IV Fentanyl Moderate Sedation Time:  12 MINUTES The patient was continuously monitored during the procedure by the interventional radiology nurse under my direct supervision. PROCEDURE: The procedure, risks, benefits, and alternatives were explained to the patient. Questions regarding the procedure were encouraged and answered. The patient understands and consents to the procedure. Previous imaging reviewed. Patient positioned supine. Noncontrast localization CT performed. The left pelvic sidewall  irregular enlarging soft tissue mass was localized and marked. This was correlate with prior imaging. Overlying skin marked for an anterior oblique approach. Under sterile conditions and local  anesthesia, a 17 gauge 11.8 cm access needle was advanced under CT guidance to the abnormality. Needle position confirmed with CT. 18 gauge core biopsies obtained. These were placed in saline. Postprocedure imaging demonstrates no hemorrhage or hematoma. Patient tolerated the procedure well without complication. Vital sign monitoring by nursing staff during the procedure will continue as patient is in the special procedures unit for post procedure observation. FINDINGS: The images document guide needle placement within the left pelvic sidewall mass. Post biopsy images demonstrate no hemorrhage or hematoma. COMPLICATIONS: None immediate. IMPRESSION: Successful CT-guided core biopsy of the left pelvic sidewall enlarging indeterminate mass Electronically Signed   By: Jerilynn Mages.  Shick M.D.   On: 02/21/2019 15:00   Dg C-arm 1-60 Min-no Report  Result Date: 03/17/2019 Fluoroscopy was utilized by the requesting physician.  No radiographic interpretation.   Assessment/Plan The patient is an 85 year old male with a past medical history of diffuse large B-cell lymphoma, urothelial cancer, coronary artery disease, hyperlipidemia, hypertension, diverticulitis, GERD with a chief complaint of left lower extremity pain and swelling  1.  Extensive left lower extremity DVT: Patient with acute onset of pain and swelling to the left lower extremity.  Patient currently being treated for cancer.  Extensive DVT extending from the left common femoral vein through the left tibial veins as well as occlusive superficial thrombophlebitis involving the greater saphenous vein at the level of the mid thigh.  On physical exam, the patient is severely edematous with mottling of the foot and toes.  Recommend an emergent venous lysis and attempt to lessen the clot burden and improve arterial perfusion to the left lower extremity.  Procedure, risks and benefits explained to the patient.  All questions answered.  Patient wishes to proceed.  Patient  will need to be treated with heparin and transition to p.o. oral anticoagulant such as Eliquis. 2. Hyperlipidemia: On statin. Encouraged good control as its slows the progression of atherosclerotic disease. 3. Hypertension: On appropriate medications. Encouraged good control as its slows the progression of atherosclerotic disease.  Discussed with Dr. Mayme Genta, PA-C  03/22/2019 9:36 AM    This note was created with Dragon medical transcription system.  Any error is purely unintentional

## 2019-03-22 NOTE — ED Notes (Signed)
Patient transported to Ultrasound 

## 2019-03-23 ENCOUNTER — Ambulatory Visit: Payer: PPO

## 2019-03-23 ENCOUNTER — Encounter: Payer: Self-pay | Admitting: Vascular Surgery

## 2019-03-23 ENCOUNTER — Ambulatory Visit: Payer: PPO | Admitting: Family Medicine

## 2019-03-23 DIAGNOSIS — K219 Gastro-esophageal reflux disease without esophagitis: Secondary | ICD-10-CM | POA: Diagnosis present

## 2019-03-23 DIAGNOSIS — Z881 Allergy status to other antibiotic agents status: Secondary | ICD-10-CM | POA: Diagnosis not present

## 2019-03-23 DIAGNOSIS — Z20828 Contact with and (suspected) exposure to other viral communicable diseases: Secondary | ICD-10-CM | POA: Diagnosis present

## 2019-03-23 DIAGNOSIS — H9190 Unspecified hearing loss, unspecified ear: Secondary | ICD-10-CM | POA: Diagnosis present

## 2019-03-23 DIAGNOSIS — I82422 Acute embolism and thrombosis of left iliac vein: Secondary | ICD-10-CM | POA: Diagnosis present

## 2019-03-23 DIAGNOSIS — I129 Hypertensive chronic kidney disease with stage 1 through stage 4 chronic kidney disease, or unspecified chronic kidney disease: Secondary | ICD-10-CM | POA: Diagnosis present

## 2019-03-23 DIAGNOSIS — Z79899 Other long term (current) drug therapy: Secondary | ICD-10-CM | POA: Diagnosis not present

## 2019-03-23 DIAGNOSIS — Z8719 Personal history of other diseases of the digestive system: Secondary | ICD-10-CM | POA: Diagnosis not present

## 2019-03-23 DIAGNOSIS — E785 Hyperlipidemia, unspecified: Secondary | ICD-10-CM | POA: Diagnosis present

## 2019-03-23 DIAGNOSIS — I82412 Acute embolism and thrombosis of left femoral vein: Secondary | ICD-10-CM | POA: Diagnosis present

## 2019-03-23 DIAGNOSIS — I82442 Acute embolism and thrombosis of left tibial vein: Secondary | ICD-10-CM | POA: Diagnosis present

## 2019-03-23 DIAGNOSIS — Z87891 Personal history of nicotine dependence: Secondary | ICD-10-CM | POA: Diagnosis not present

## 2019-03-23 DIAGNOSIS — I82432 Acute embolism and thrombosis of left popliteal vein: Secondary | ICD-10-CM | POA: Diagnosis present

## 2019-03-23 DIAGNOSIS — Z9221 Personal history of antineoplastic chemotherapy: Secondary | ICD-10-CM | POA: Diagnosis not present

## 2019-03-23 DIAGNOSIS — I2510 Atherosclerotic heart disease of native coronary artery without angina pectoris: Secondary | ICD-10-CM | POA: Diagnosis present

## 2019-03-23 DIAGNOSIS — Z7902 Long term (current) use of antithrombotics/antiplatelets: Secondary | ICD-10-CM | POA: Diagnosis not present

## 2019-03-23 DIAGNOSIS — Z888 Allergy status to other drugs, medicaments and biological substances status: Secondary | ICD-10-CM | POA: Diagnosis not present

## 2019-03-23 DIAGNOSIS — I82419 Acute embolism and thrombosis of unspecified femoral vein: Secondary | ICD-10-CM | POA: Diagnosis not present

## 2019-03-23 DIAGNOSIS — N179 Acute kidney failure, unspecified: Secondary | ICD-10-CM | POA: Diagnosis present

## 2019-03-23 DIAGNOSIS — Z880 Allergy status to penicillin: Secondary | ICD-10-CM | POA: Diagnosis not present

## 2019-03-23 DIAGNOSIS — N184 Chronic kidney disease, stage 4 (severe): Secondary | ICD-10-CM | POA: Diagnosis present

## 2019-03-23 DIAGNOSIS — Z955 Presence of coronary angioplasty implant and graft: Secondary | ICD-10-CM | POA: Diagnosis not present

## 2019-03-23 DIAGNOSIS — C679 Malignant neoplasm of bladder, unspecified: Secondary | ICD-10-CM | POA: Diagnosis present

## 2019-03-23 DIAGNOSIS — Z8572 Personal history of non-Hodgkin lymphomas: Secondary | ICD-10-CM | POA: Diagnosis not present

## 2019-03-23 DIAGNOSIS — Z882 Allergy status to sulfonamides status: Secondary | ICD-10-CM | POA: Diagnosis not present

## 2019-03-23 LAB — CBC
HCT: 33.9 % — ABNORMAL LOW (ref 39.0–52.0)
Hemoglobin: 10.7 g/dL — ABNORMAL LOW (ref 13.0–17.0)
MCH: 27.9 pg (ref 26.0–34.0)
MCHC: 31.6 g/dL (ref 30.0–36.0)
MCV: 88.3 fL (ref 80.0–100.0)
Platelets: 340 10*3/uL (ref 150–400)
RBC: 3.84 MIL/uL — ABNORMAL LOW (ref 4.22–5.81)
RDW: 15 % (ref 11.5–15.5)
WBC: 10.6 10*3/uL — ABNORMAL HIGH (ref 4.0–10.5)
nRBC: 0 % (ref 0.0–0.2)

## 2019-03-23 LAB — BASIC METABOLIC PANEL
Anion gap: 9 (ref 5–15)
BUN: 51 mg/dL — ABNORMAL HIGH (ref 8–23)
CO2: 27 mmol/L (ref 22–32)
Calcium: 9.1 mg/dL (ref 8.9–10.3)
Chloride: 103 mmol/L (ref 98–111)
Creatinine, Ser: 2.23 mg/dL — ABNORMAL HIGH (ref 0.61–1.24)
GFR calc Af Amer: 30 mL/min — ABNORMAL LOW (ref >60)
GFR calc non Af Amer: 26 mL/min — ABNORMAL LOW (ref >60)
Glucose, Bld: 105 mg/dL — ABNORMAL HIGH (ref 70–99)
Potassium: 4.6 mmol/L (ref 3.5–5.1)
Sodium: 139 mmol/L (ref 135–145)

## 2019-03-23 LAB — MAGNESIUM: Magnesium: 2.2 mg/dL (ref 1.7–2.4)

## 2019-03-23 LAB — HEPARIN LEVEL (UNFRACTIONATED): Heparin Unfractionated: 0.35 IU/mL (ref 0.30–0.70)

## 2019-03-23 MED ORDER — HYDROCODONE-ACETAMINOPHEN 5-325 MG PO TABS: 1.0000 | ORAL_TABLET | Freq: Four times a day (QID) | ORAL | Status: DC | PRN

## 2019-03-23 MED ORDER — APIXABAN 5 MG PO TABS
5.0000 mg | ORAL_TABLET | Freq: Once | ORAL | Status: AC
  Administered 2019-03-23: 5 mg via ORAL
  Filled 2019-03-23: qty 1

## 2019-03-23 MED ORDER — FAMOTIDINE 20 MG PO TABS
20.0000 mg | ORAL_TABLET | Freq: Every day | ORAL | Status: DC
  Administered 2019-03-24: 20 mg via ORAL
  Filled 2019-03-23: qty 1

## 2019-03-23 NOTE — TOC Initial Note (Signed)
Transition of Care [facility]) - Initial/Assessment Note    Patient Details  Name: Bryan Nelson MRN: [id number] Date of Birth: 1931-10-05  [facility]) CM/SW Contact:    Katrina Stack, RN Phone Number: 03/23/2019, 6:59 PM  Clinical Narrative:     Presented with extensive DVT left lower leg. Stent placement. To discharge on Eliquis.  Very confirms pharmacy coverage. Provided with 30 day coupon.  Lives alone and drives.  Being followed by [facility] oncology with recent diagnosis of bladder cancer with mets to pelvic area.  Patient is very hard of hearing. Friend will transport home tomorrow. Physical therapy has recommended home health physical therapy.   Has had home health with [facility] in the past but at present unable to accept [facility].  Heads up to [facility] for home physical therpay       Patient Goals and CMS Choice        Expected Discharge Plan and Services                                                Prior Living Arrangements/Services                       Activities of Daily Living Home Assistive Devices/Equipment: Hearing aid, Dentures (specify type), Eyeglasses ADL Screening (condition at time of admission) Patient's cognitive ability adequate to safely complete daily activities?: Yes Is the patient deaf or have difficulty hearing?: Yes Does the patient have difficulty seeing, even when wearing glasses/contacts?: No Does the patient have difficulty concentrating, remembering, or making decisions?: No Patient able to express need for assistance with ADLs?: Yes Does the patient have difficulty dressing or bathing?: No Independently performs ADLs?: Yes (appropriate for developmental age) Does the patient have difficulty walking or climbing stairs?: No Weakness of Legs: None Weakness of Arms/Hands: None  Permission Sought/Granted                  Emotional Assessment              Admission diagnosis:  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 Angioedema 03/16/2019  . Goals of care, counseling/discussion 03/03/2019  . Urothelial carcinoma ([place]) 03/03/2019  . BPH (benign prostatic hyperplasia) 03/02/2017  . Recurrent UTI 12/21/2015  . Urinary retention 09/24/2015  . Narrowing of intervertebral disc space 06/18/2015  . Diverticulitis 06/18/2015  . Esophageal reflux 06/18/2015  . Familial multiple lipoprotein-type hyperlipidemia 06/18/2015  . Insomnia 06/18/2015  . Vitamin D deficiency 06/18/2015  . DLBCL (diffuse large B cell lymphoma) ([place]) 04/10/2014  . ED (erectile dysfunction) of organic origin 08/03/2012  . Incomplete bladder emptying 08/03/2012  . Breath shortness 12/09/2011  . Arteriosclerosis of coronary artery 08/26/2011  . Bleeding gastrointestinal 08/26/2011  . HLD (hyperlipidemia) 08/26/2011  . Essential hypertension 08/26/2011  . Peripheral nerve disease 08/26/2011   PCP:  [name], MD Pharmacy:   [facility] [address], [place] - [place] [address] [place] [address] Phone: [phone] Fax: [phone]     Social Determinants of Health (SDOH) Interventions    Readmission Risk Interventions No flowsheet data found.

## 2019-03-23 NOTE — Evaluation (Signed)
Physical Therapy Evaluation Patient Details Name: Bryan Nelson MRN: [id number] DOB: 10-05-1931 Today's Date: 03/23/2019   History of Present Illness  85 year old male with a past medical history of diffuse large B-cell lymphoma, urothelial cancer, coronary artery disease, hyperlipidemia, hypertension, diverticulitis, GERD with a chief complaint of left lower extremity pain and swelling.  Found to have extensive L LE DVT needing thrombectomy and stent placement.  Clinical Impression  Pt eager to work with PT, did not have hearing aids so some difficulty with communication.  Pt showed functional strength and mobility, though L LE was guarded t/o the session.  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  He apparently does not use an AD at baseline and is independent in the community, he managed a few steps w/o AD this date but essentially was reliant on the walker or HHA during ambulation this date.  Pt states he's pretty sure he has a walker to home, he is weaker than his baseline and would benefit from HHPT to work back to his independent PLOF.      Follow Up Recommendations Home health PT    Equipment Recommendations  None recommended by PT    Recommendations for Other Services       Precautions / Restrictions Precautions Precautions: Fall Restrictions Weight Bearing Restrictions: No      Mobility  Bed Mobility Overal bed mobility: Modified Independent             General bed mobility comments: Pt able to get to sitting EOB w/o assist  Transfers Overall transfer level: Modified independent Equipment used: Rolling walker (2 wheeled)             General transfer comment: Pt was able to rise to standing with heavy UE use, but did not need direct physical assist.  Utilized walker to stabilize once up  Ambulation/Gait Ambulation/Gait assistance:  Supervision Gait Distance (Feet): 250 Feet Assistive device: Rolling walker (2 wheeled)       General Gait Details: Pt was able to maintain consistent cadence with walker, initially slow and guarded.  Able to increase speed minimally with increased distance, ambulated last 50 ft with single UE HHA with no overt LOBs but did have some unsteadiness.  Pt's HR increased to 110 and O2 to low 90s with the effort, but no excessive c/o fatigue and generally safe with UE usage (pt does not use AD at baseline)  Stairs            Wheelchair Mobility    Modified Rankin (Stroke Patients Only)       Balance Overall balance assessment: Modified Independent;Mild deficits observed, not formally tested                                           Pertinent Vitals/Pain Pain Assessment: 0-10 Pain Score: 4  Pain Location: L calf    Home Living Family/patient expects to be discharged to:: Private residence Living Arrangements: Alone Available Help at Discharge: Friend(s)(reports friends visit essentially daily)   Home Access: Stairs to enter   Technical brewer of Steps: 3   Home Equipment: Environmental consultant - 2 wheels;Cane - single point      Prior Function Level of Independence: Independent         Comments: Pt drives and  runs his own errands, able to be relatively active     Hand Dominance        Extremity/Trunk Assessment   Upper Extremity Assessment Upper Extremity Assessment: Overall WFL for tasks assessed(age appropriate limitations)    Lower Extremity Assessment Lower Extremity Assessment: Overall WFL for tasks assessed;Generalized weakness(some hesitancy with L LE 2/2 pain, functional t/o)       Communication   Communication: HOH(did not have hearing aids with him)  Cognition Arousal/Alertness: Awake/alert Behavior During Therapy: WFL for tasks assessed/performed Overall Cognitive Status: Within Functional Limits for tasks assessed                                         General Comments      Exercises     Assessment/Plan    PT Assessment Patient needs continued PT services  PT Problem List Decreased strength;Decreased balance;Decreased activity tolerance;Decreased mobility;Decreased coordination;Decreased range of motion;Decreased knowledge of use of DME;Decreased safety awareness       PT Treatment Interventions DME instruction;Gait training;Stair training;Functional mobility training;Therapeutic activities;Therapeutic exercise;Balance training;Neuromuscular re-education;Patient/family education    PT Goals (Current goals can be found in the Care Plan section)  Acute Rehab PT Goals Patient Stated Goal: Go home PT Goal Formulation: With patient Time For Goal Achievement: 04/06/19 Potential to Achieve Goals: Good    Frequency Min 2X/week   Barriers to discharge        Co-evaluation               AM-PAC PT "6 Clicks" Mobility  Outcome Measure Help needed turning from your back to your side while in a flat bed without using bedrails?: None Help needed moving from lying on your back to sitting on the side of a flat bed without using bedrails?: None Help needed moving to and from a bed to a chair (including a wheelchair)?: A Little Help needed standing up from a chair using your arms (e.g., wheelchair or bedside chair)?: A Little Help needed to walk in hospital room?: A Little Help needed climbing 3-5 steps with a railing? : A Little 6 Click Score: 20    End of Session Equipment Utilized During Treatment: Gait belt Activity Tolerance: Patient tolerated treatment well Patient left: with chair alarm set;with call bell/phone within reach Nurse Communication: Mobility status PT Visit Diagnosis: Muscle weakness (generalized) (M62.81);Difficulty in walking, not elsewhere classified (R26.2)    Time: 1093-2355 PT Time Calculation (min) (ACUTE ONLY): 32 min   Charges:   PT Evaluation $PT Eval Low  Complexity: 1 Low PT Treatments $Gait Training: 8-22 mins        Kreg Shropshire, DPT 03/23/2019, 3:41 PM

## 2019-03-23 NOTE — Progress Notes (Signed)
[facility] Daily Progress Note   Subjective: 1 Day Post-Op: 1. US guidance for vascular access to left popliteal vein 2. Catheter placement into left common iliac vein from left popliteal approach 3. IVC gram and LLE venogram 4.  Catheter directed thrombolysis with 4 mg of tpa to the left iliac vein 5. Mechanical thrombectomy to left external iliac vein and common iliac vein with the penumbra cat 12 device 6. PTA of the left common and external iliac veins with 12 mm diameter by 8 cm length balloon 7. Stent placement to the left common and external iliac veins with 16 mm diameter by 10 cm length Venovo stent  Patient with concerns about being able to ambulate safely with newly diagnosed DVT/post procedure.  The patient has not been out of bed to the chair or ambulating yet.  Transition from heparin to Eliquis this AM.  No issues overnight.  Objective: Vitals:   03/22/19 1515 03/22/19 1735 03/22/19 2023 03/23/19 0526  BP: 133/72 112/70 136/70 (!) 151/72  Pulse: 86 90 99 72  Resp: 16 15 20 20   Temp: 98.5 F (36.9 C) 98.3 F (36.8 C) 98.9 F (37.2 C) 98.2 F (36.8 C)  TempSrc: Oral Oral Oral Oral  SpO2: 93% 100% 99% 98%  Weight:      Height:        Intake/Output Summary (Last 24 hours) at 03/23/2019 1218 Last data filed at 03/23/2019 1937 Gross per 24 hour  Intake 398.99 ml  Output 2050 ml  Net -1651.01 ml   Physical Exam: A&Ox3, NAD CV: RRR Pulmonary: CTA Bilaterally Abdomen: Soft, Nontender, Nondistended Vascular:  Calf Access Site: dressing clean and dry. No swelling or drainage.  Left lower extremity: Soft.  Calf soft.  Foot is no longer mottled in appearance.  Edema has improved.  Motor/sensory is intact.   Laboratory: CBC    Component Value Date/Time   WBC 10.6 (H) 03/23/2019 0420   HGB 10.7 (L) 03/23/2019 0420   HGB 13.1 08/10/2018 0821   HCT 33.9 (L) 03/23/2019 0420   HCT 38.9 08/10/2018 0821   PLT 340 03/23/2019 0420    PLT 240 08/10/2018 0821   BMET    Component Value Date/Time   NA 139 03/23/2019 0420   NA 140 11/09/2018 0908   NA 144 09/16/2014 0928   K 4.6 03/23/2019 0420   K 3.5 09/16/2014 0928   CL 103 03/23/2019 0420   CL 105 09/16/2014 0928   CO2 27 03/23/2019 0420   CO2 31 09/16/2014 0928   GLUCOSE 105 (H) 03/23/2019 0420   GLUCOSE 106 (H) 09/16/2014 0928   BUN 51 (H) 03/23/2019 0420   BUN 19 11/09/2018 0908   BUN 17 09/16/2014 0928   CREATININE 2.23 (H) 03/23/2019 0420   CREATININE 1.01 09/16/2014 0928   CALCIUM 9.1 03/23/2019 0420   CALCIUM 8.4 (L) 09/16/2014 0928   GFRNONAA 26 (L) 03/23/2019 0420   GFRNONAA >60 09/16/2014 0928   GFRNONAA >60 04/30/2014 1121   GFRAA 30 (L) 03/23/2019 0420   GFRAA >60 09/16/2014 0928   GFRAA >60 04/30/2014 1121   Assessment/Planning: The patient is an 85 year old male with multiple medical issues with recent extensive lower extremity DVT status post venous lysis postop day #1 1) Heparin drip transitioned to Eliquis 2) Will order physical therapy, encourage ambulation 3) Plan for discharge home tomorrow  Discussed with Dr. Ellis Parents Bryan Steines PA-C 03/23/2019 12:18 PM

## 2019-03-23 NOTE — Progress Notes (Signed)
ANTICOAGULATION CONSULT NOTE  Pharmacy Consult for heparin Indication: chest pain/ACS  Patient Measurements: Height: 6\' 1"  (185.4 cm) Weight: 239 lb (108.4 kg) IBW/kg (Calculated) : 79.9 Heparin Dosing Weight: 91.3 kg  Vital Signs: Temp: 98.9 F (37.2 C) (07/30 2023) Temp Source: Oral (07/30 2023) BP: 136/70 (07/30 2023) Pulse Rate: 99 (07/30 2023)  Labs: Recent Labs    03/20/19 0800 03/22/19 0651 03/22/19 1453 03/23/19 0008  HGB 11.1* 11.3*  --   --   HCT 33.6* 35.3*  --   --   PLT 346 457*  --   --   APTT  --  26  --   --   LABPROT  --  12.8  --   --   INR  --  1.0  --   --   HEPARINUNFRC  --   --  0.94* 0.35  CREATININE 1.84* 2.46*  --   --   TROPONINIHS  --  15  --   --     Estimated Creatinine Clearance: 28.4 mL/min (A) (by C-G formula based on SCr of 2.46 mg/dL (H)).   Medical History: Past Medical History:  Diagnosis Date  . Arteriosclerosis of coronary artery 08/26/2011   Overview:      a.  1999 PCI of the mid LAD with stent.      b.  2002 Cath [place]: EF 56%. RCA-distal 25/25%. Left main-50% ostial.  Left circumflex-25% OM2.  LAD-25% proximal.  75% mid.  25/25% distal. 75% D1.      c.  07/2011 PCI of RCA with DES.[place]   . Bleeding gastrointestinal 08/26/2011   Overview:      a.  Chronic abdominal pain, present improving.      b.  Duodenitis and gastritis by EGD in 2000.      c.  Pylori in 2000, treated.      d.  Gastroesophageal reflux disease.      e.  Diverticular disease.    . BP (high blood pressure) 08/26/2011  . Closed dislocation of right ankle 09/10/2016   Successfully reduced in ER 09/10/2016  . Diffuse large B-cell lymphoma ([place]) 2015   chemo tx's  . GERD (gastroesophageal reflux disease)   . Heart disease   . Helicobacter pylori infection 06/18/2015   by EGD RX 08/18/1999   . History of adenomatous polyp of colon 06/18/2015  . HOH (hard of hearing)    Bilateral Hearing  Aids  . Malleolar fracture (Right) 09/10/2016    Medications:   Scheduled:  . apixaban  5 mg Oral BID  . aspirin EC  81 mg Oral Daily  . atorvastatin  10 mg Oral Daily  . cholecalciferol  1,000 Units Oral Daily  . docusate sodium  100 mg Oral BID  . doxycycline  100 mg Oral BID  . famotidine  20 mg Oral BID  . finasteride  5 mg Oral Daily  . gabapentin  300 mg Oral QHS  . hydrochlorothiazide  25 mg Oral Daily  . isosorbide dinitrate  30 mg Oral Daily  . magnesium oxide  200 mg Oral Daily  . metoprolol succinate  25 mg Oral Daily  . multivitamin with minerals  1 tablet Oral Daily  . multivitamin-lutein  1 capsule Oral Daily  . psyllium  1 packet Oral Daily    Assessment: Patient here from home w/ leg swelling and pain since 0600, swollen purple in color, not palpable. Patient not on any anticoagulation PTA.   Vascular Ultrasound (03/22/19): 1. Examination is  positive for extensive occlusive DVT extending from the left common femoral vein through the left tibial veins. 2. Occlusive superficial thrombophlebitis involving the greater saphenous vein at the level of the mid thigh.  Goal of Therapy:  Heparin level 0.3-0.7 units/ml Monitor platelets by anticoagulation protocol: Yes   Heparin Course: 7/30 am initiation:  4000 units IV bolus, then 1400 units/hr 7/30 1124: he received 4000 units of heparin and 4 mg alteplase during an angioplasty. Heparin infusion was stopped prior to the procedure (heparin level was not cancelled prior to these actions)  Plan:  07/31 @ 0000 HL 0.35 therapeutic. Will continue heparin drip at current rate and will not get a f/u anti-Xa considering heparin drip is going to be stopped @ 0900 w/ plans for patient to start apixaban @ 1000, CBC's have been stable so far will continue to monitor.  Tobie Lords, PharmD, BCPS Clinical Pharmacist 03/23/2019,3:00 AM

## 2019-03-24 ENCOUNTER — Encounter: Payer: Self-pay | Admitting: Surgery

## 2019-03-24 DIAGNOSIS — I82412 Acute embolism and thrombosis of left femoral vein: Secondary | ICD-10-CM | POA: Diagnosis present

## 2019-03-24 LAB — CBC
HCT: 34.7 % — ABNORMAL LOW (ref 39.0–52.0)
Hemoglobin: 11.2 g/dL — ABNORMAL LOW (ref 13.0–17.0)
MCH: 28.1 pg (ref 26.0–34.0)
MCHC: 32.3 g/dL (ref 30.0–36.0)
MCV: 87 fL (ref 80.0–100.0)
Platelets: 322 10*3/uL (ref 150–400)
RBC: 3.99 MIL/uL — ABNORMAL LOW (ref 4.22–5.81)
RDW: 14.6 % (ref 11.5–15.5)
WBC: 11.3 10*3/uL — ABNORMAL HIGH (ref 4.0–10.5)
nRBC: 0 % (ref 0.0–0.2)

## 2019-03-24 LAB — BASIC METABOLIC PANEL
Anion gap: 11 (ref 5–15)
BUN: 50 mg/dL — ABNORMAL HIGH (ref 8–23)
CO2: 28 mmol/L (ref 22–32)
Calcium: 9.1 mg/dL (ref 8.9–10.3)
Chloride: 98 mmol/L (ref 98–111)
Creatinine, Ser: 2.05 mg/dL — ABNORMAL HIGH (ref 0.61–1.24)
GFR calc Af Amer: 33 mL/min — ABNORMAL LOW (ref >60)
GFR calc non Af Amer: 29 mL/min — ABNORMAL LOW (ref >60)
Glucose, Bld: 115 mg/dL — ABNORMAL HIGH (ref 70–99)
Potassium: 3.8 mmol/L (ref 3.5–5.1)
Sodium: 137 mmol/L (ref 135–145)

## 2019-03-24 LAB — MAGNESIUM: Magnesium: 1.9 mg/dL (ref 1.7–2.4)

## 2019-03-24 MED ORDER — FAMOTIDINE 20 MG PO TABS: 20.0000 mg | ORAL_TABLET | Freq: Every day | ORAL | 3 refills | Status: AC

## 2019-03-24 MED ORDER — ASPIRIN 81 MG PO TBEC: 81.0000 mg | DELAYED_RELEASE_TABLET | Freq: Every day | ORAL | 3 refills | Status: DC

## 2019-03-24 MED ORDER — APIXABAN 5 MG PO TABS: 5.0000 mg | ORAL_TABLET | Freq: Two times a day (BID) | ORAL | 3 refills | Status: AC

## 2019-03-24 NOTE — Plan of Care (Signed)

## 2019-03-24 NOTE — Progress Notes (Signed)
2 Days Post-Op   Subjective/Chief Complaint: Patient is doing well with significantly decreased swelling of the left lower extremity.  Patient is ready to go home.    Objective: Vital signs in last 24 hours: Temp:  [98.4 F (36.9 C)-99.2 F (37.3 C)] 98.4 F (36.9 C) (08/01 0536) Pulse Rate:  [90-101] 98 (08/01 1105) Resp:  [17] 17 (08/01 0536) BP: (105-132)/(53-69) 122/60 (08/01 1105) SpO2:  [98 %-100 %] 100 % (08/01 0536) Weight:  [104.4 kg] 104.4 kg (08/01 0659) Last BM Date: 03/21/19  Intake/Output from previous day: 07/31 0701 - 08/01 0700 In: 600 [P.O.:600] Out: 2800 [Urine:2800] Intake/Output this shift: Total I/O In: 480 [P.O.:480] Out: 325 [Urine:325]  General appearance: alert, cooperative and appears stated age Head: Normocephalic, without obvious abnormality, atraumatic Ears: normal TM's and external ear canals both ears Neck: no adenopathy, no carotid bruit, no JVD, supple, symmetrical, trachea midline and thyroid not enlarged, symmetric, no tenderness/mass/nodules Back: symmetric, no curvature. ROM normal. No CVA tenderness. Resp: clear to auscultation bilaterally Extremities: extremities normal, atraumatic, no cyanosis or edema and Significantly reduced edema of the left lower extremity.  The popliteal venous access site looks clean without erythema the dressing was removed and a new dressing was applied. Incision/Wound:  Lab Results:  Recent Labs    03/23/19 0420 03/24/19 0423  WBC 10.6* 11.3*  HGB 10.7* 11.2*  HCT 33.9* 34.7*  PLT 340 322   BMET Recent Labs    03/23/19 0420 03/24/19 0423  NA 139 137  K 4.6 3.8  CL 103 98  CO2 27 28  GLUCOSE 105* 115*  BUN 51* 50*  CREATININE 2.23* 2.05*  CALCIUM 9.1 9.1   PT/INR Recent Labs    03/22/19 0651  LABPROT 12.8  INR 1.0   ABG No results for input(s): PHART, HCO3 in the last 72 hours.  Invalid input(s): PCO2, PO2  Studies/Results: No results found.  Anti-infectives: Anti-infectives  (From admission, onward)   Start     Dose/Rate Route Frequency Ordered Stop   03/22/19 1430  doxycycline (VIBRA-TABS) tablet 100 mg  Status:  Discontinued     100 mg Oral 2 times daily 03/22/19 1218 03/23/19 1059   03/22/19 1045  clindamycin (CLEOCIN) IVPB 300 mg  Status:  Discontinued     300 mg 100 mL/hr over 30 Minutes Intravenous  Once 03/22/19 1031 03/22/19 1218   03/22/19 1045  clindamycin (CLEOCIN) IVPB 300 mg  Status:  Discontinued     300 mg 100 mL/hr over 30 Minutes Intravenous  Once 03/22/19 1031 03/22/19 1048   03/22/19 1033  clindamycin (CLEOCIN) 300 MG/50ML IVPB    Note to Pharmacy: Carlynn Spry   : cabinet override      03/22/19 1033 03/22/19 2244      Assessment/Plan: s/p Procedure(s): PERIPHERAL VASCULAR THROMBECTOMY and possible IVC filter (Left) Discharge  LOS: 1 day    Elmore Guise 03/24/2019

## 2019-03-25 NOTE — Progress Notes (Signed)
[place]  [phone]  [phone]     Bryan Nelson DOB: 1931-10-02  MR#: [id number]  EXH#:[id number]  Patient Care Team: [name], MD as PCP - General (Family Medicine) Dingeldein, Remo Lipps, MD as Consulting Physician (Ophthalmology) Hollice Espy, MD as Consulting Physician (Urology) Yolonda Kida, MD as Consulting Physician (Cardiology) Laneta Simmers as Physician Assistant (Urology)   CHIEF COMPLAINT: History of diffuse large B-cell lymphoma, now with urothelial carcinoma.  INTERVAL HISTORY: Patient returns to clinic today for further evaluation and consideration of cycle 3 of weekly cisplatin.  He was recently in the hospital with a large left lower extremity DVT requiring stent placement.  He was also placed on Eliquis.  He otherwise feels well and is back to his baseline.  His left flank pain has improved.  He has no neurologic complaints.  He denies any recent fevers or illnesses.  He has a fair appetite and denies weight loss.  He denies any chest pain, shortness of breath, cough, or hemoptysis.  He has no nausea, vomiting, constipation, or diarrhea.  He denies any melena or hematochezia.  He has no urinary complaints.  Patient offers no further specific complaints today.  REVIEW OF SYSTEMS:   Review of Systems  Constitutional: Negative.  Negative for diaphoresis, fever, malaise/fatigue and weight loss.  Respiratory: Negative.  Negative for cough and shortness of breath.   Cardiovascular: Negative.  Negative for chest pain and leg swelling.  Gastrointestinal: Negative.  Negative for abdominal pain, blood in stool and melena.  Genitourinary: Positive for flank pain. Negative for dysuria, frequency, hematuria and urgency.  Musculoskeletal: Negative for joint pain.  Skin: Negative.  Negative for rash.  Neurological: Negative.  Negative for dizziness, focal weakness, weakness and headaches.   Psychiatric/Behavioral: Negative.  The patient is not nervous/anxious.     As per HPI. Otherwise, a complete review of systems is negative.  ONCOLOGY HISTORY: Oncology History   No history exists.    PAST MEDICAL HISTORY: Past Medical History:  Diagnosis Date  . Arteriosclerosis of coronary artery 08/26/2011   Overview:      a.  1999 PCI of the mid LAD with stent.      b.  2002 Cath [place]: EF 56%. RCA-distal 25/25%. Left main-50% ostial.  Left circumflex-25% OM2.  LAD-25% proximal.  75% mid.  25/25% distal. 75% D1.      c.  07/2011 PCI of RCA with DES.   . Bleeding gastrointestinal 08/26/2011   Overview:      a.  Chronic abdominal pain, present improving.      b.  Duodenitis and gastritis by EGD in 2000.      c.  Pylori in 2000, treated.      d.  Gastroesophageal reflux disease.      e.  Diverticular disease.    . BP (high blood pressure) 08/26/2011  . Closed dislocation of right ankle 09/10/2016   Successfully reduced in ER 09/10/2016  . Diffuse large B-cell lymphoma ([place]) 2015   chemo tx's  . GERD (gastroesophageal reflux disease)   . Heart disease   . Helicobacter pylori infection 06/18/2015   by EGD RX 08/18/1999   . History of adenomatous polyp of colon 06/18/2015  . HOH (hard of hearing)    Bilateral Hearing  Aids  . Malleolar fracture (Right) 09/10/2016    PAST SURGICAL HISTORY: Past Surgical History:  Procedure Laterality Date  . ANGIOPLASTY    . CATARACT EXTRACTION    .  COLONOSCOPY  2016  . CORONARY ANGIOPLASTY    . CYSTOSCOPY WITH STENT PLACEMENT Left 03/17/2019   Procedure: CYSTOSCOPY WITH STENT PLACEMENT;  Surgeon: Festus Aloe, MD;  Location: [facility] ORS;  Service: Urology;  Laterality: Left;  . EYE SURGERY Bilateral    Cataract Extraction with IOL  . GREEN LIGHT LASER TURP (TRANSURETHRAL RESECTION OF PROSTATE N/A 04/16/2015   Procedure: GREEN LIGHT LASER TURP (TRANSURETHRAL RESECTION OF PROSTATE WITH BLADDER BIOPSY;  Surgeon: Collier Flowers, MD;  Location:  [facility] OR[facility];  Service: Urology;  Laterality: N/A;  . heart stent placement     1998, 2000, 2012  . PERIPHERAL VASCULAR THROMBECTOMY Left 03/22/2019   Procedure: PERIPHERAL VASCULAR THROMBECTOMY and possible IVC filter;  Surgeon: Algernon Huxley, MD;  Location: [facility];  Service: Cardiovascular;  Laterality: Left;  . TRANSURETHRAL RESECTION OF PROSTATE N/A 03/02/2017   Procedure: TRANSURETHRAL RESECTION OF THE PROSTATE (TURP);  Surgeon: Hollice Espy, MD;  Location: [facility];  Service: Urology;  Laterality: N/A;    FAMILY HISTORY Family History  Problem Relation Age of Onset  . Osteoporosis Mother   . Alzheimer's disease Father   . Kidney disease Neg Hx   . Prostate cancer Neg Hx   . Bladder Cancer Neg Hx   . Kidney cancer Neg Hx    AVANCED DIRECTIVES:    HEALTH MAINTENANCE: Social History   Tobacco Use  . Smoking status: Former Smoker    Packs/day: 1.50    Years: 12.00    Pack years: 18.00    Types: Cigarettes    Quit date: 08/24/1971    Years since quitting: 47.6  . Smokeless tobacco: Never Used  Substance Use Topics  . Alcohol use: No    Alcohol/week: 0.0 standard drinks  . Drug use: No    Allergies  Allergen Reactions  . Augmentin [Amoxicillin-Pot Clavulanate]     Tongue swelling 2 days after taking  . Ciprofloxacin Diarrhea  . Lisinopril Other (See Comments)    Unknown   . Penicillins Swelling    unknwon reaction.  tolerates amoxicillin Has patient had a PCN reaction causing immediate rash, facial/tongue/throat swelling, SOB or lightheadedness with hypotension: No Has patient had a PCN reaction causing severe rash involving mucus membranes or skin necrosis: No Has patient had a PCN reaction that required hospitalization: No Has patient had a PCN reaction occurring within the last 10 years: Yes If all of the above answers are "NO", then may proceed with Cephalosporin use.   . Bactrim [Sulfamethoxazole-Trimethoprim] Rash  . Sulfa Antibiotics Itching and  Rash    Current Outpatient Medications  Medication Sig Dispense Refill  . ALPRAZolam (XANAX) 0.5 MG tablet Take 1 tablet (0.5 mg total) by mouth at bedtime as needed for anxiety. 30 tablet 1  . apixaban (ELIQUIS) 5 MG TABS tablet Take 1 tablet (5 mg total) by mouth 2 (two) times daily. 60 tablet 3  . atorvastatin (LIPITOR) 10 MG tablet Take 1 tablet (10 mg total) by mouth daily. 30 tablet 11  . Cholecalciferol (VITAMIN D3) 1000 units CAPS Take 1,000 Units by mouth daily.    . famotidine (PEPCID) 20 MG tablet Take 1 tablet (20 mg total) by mouth daily. 180 tablet 3  . finasteride (PROSCAR) 5 MG tablet Take 1 tablet (5 mg total) by mouth daily. 90 tablet 3  . gabapentin (NEURONTIN) 300 MG capsule Take 1 capsule (300 mg total) by mouth at bedtime. Do not take if you take temazepam 30 capsule 6  .  hydrochlorothiazide (HYDRODIURIL) 25 MG tablet Take 0.5 tablets (12.5 mg total) by mouth daily.    . isosorbide dinitrate (ISORDIL) 30 MG tablet Take 30 mg by mouth daily.    . Magnesium 250 MG TABS Take 250 mg by mouth daily.    . metoprolol succinate (TOPROL-XL) 25 MG 24 hr tablet Take 1 tablet (25 mg total) by mouth daily. 90 tablet 4  . Multiple Vitamins-Minerals (VISION FORMULA 2 PO) Take by mouth daily.    . naproxen sodium (ALEVE) 220 MG tablet Take 220 mg by mouth daily as needed.    . ondansetron (ZOFRAN) 8 MG tablet Take 1 tablet (8 mg total) by mouth 2 (two) times daily as needed for nausea or vomiting. 20 tablet 3  . prochlorperazine (COMPAZINE) 10 MG tablet Take 1 tablet (10 mg total) by mouth every 6 (six) hours as needed for nausea or vomiting. 30 tablet 3  . psyllium (METAMUCIL) 58.6 % packet Take 1 packet by mouth daily.    Marland Kitchen zolpidem (AMBIEN) 10 MG tablet Take 1 tablet (10 mg total) by mouth at bedtime as needed for sleep. 30 tablet 0   No current facility-administered medications for this visit.     OBJECTIVE: BP 113/71 (BP Location: Left Arm, Patient Position: Sitting)   Pulse 82    Temp (!) 96.1 F (35.6 C) (Tympanic)   Ht 6\' 1"  (1.854 m)   Wt 233 lb (105.7 kg)   BMI 30.74 kg/m    Body mass index is 30.74 kg/m.    ECOG FS:0 - Asymptomatic  General: Well-developed, well-nourished, no acute distress. Eyes: Pink conjunctiva, anicteric sclera. HEENT: Normocephalic, moist mucous membranes. Lungs: Clear to auscultation bilaterally. Heart: Regular rate and rhythm. No rubs, murmurs, or gallops. Abdomen: Soft, nontender, nondistended. No organomegaly noted, normoactive bowel sounds. Musculoskeletal: No edema, cyanosis, or clubbing. Neuro: Alert, answering all questions appropriately. Cranial nerves grossly intact. Skin: No rashes or petechiae noted. Psych: Normal affect.  LAB RESULTS:  Appointment on 03/27/2019  Component Date Value Ref Range Status  . Sodium 03/27/2019 139  135 - 145 mmol/L Final  . Potassium 03/27/2019 3.9  3.5 - 5.1 mmol/L Final  . Chloride 03/27/2019 101  98 - 111 mmol/L Final  . CO2 03/27/2019 28  22 - 32 mmol/L Final  . Glucose, Bld 03/27/2019 103* 70 - 99 mg/dL Final  . BUN 03/27/2019 52* 8 - 23 mg/dL Final  . Creatinine, Ser 03/27/2019 2.30* 0.61 - 1.24 mg/dL Final  . Calcium 03/27/2019 8.8* 8.9 - 10.3 mg/dL Final  . GFR calc non Af Amer 03/27/2019 25* >60 mL/min Final  . GFR calc Af Amer 03/27/2019 29* >60 mL/min Final  . Anion gap 03/27/2019 10  5 - 15 Final   Performed at [facility], [address], [place], [place] [address]  . WBC 03/27/2019 9.6  4.0 - 10.5 K/uL Final  . RBC 03/27/2019 3.63* 4.22 - 5.81 MIL/uL Final  . Hemoglobin 03/27/2019 10.1* 13.0 - 17.0 g/dL Final  . HCT 03/27/2019 31.1* 39.0 - 52.0 % Final  . MCV 03/27/2019 85.7  80.0 - 100.0 fL Final  . MCH 03/27/2019 27.8  26.0 - 34.0 pg Final  . MCHC 03/27/2019 32.5  30.0 - 36.0 g/dL Final  . RDW 03/27/2019 14.7  11.5 - 15.5 % Final  . Platelets 03/27/2019 304  150 - 400 K/uL Final  . nRBC 03/27/2019 0.0  0.0 - 0.2 % Final  . Neutrophils Relative %  03/27/2019 74  % Final  .  Neutro Abs 03/27/2019 7.1  1.7 - 7.7 K/uL Final  . Lymphocytes Relative 03/27/2019 8  % Final  . Lymphs Abs 03/27/2019 0.8  0.7 - 4.0 K/uL Final  . Monocytes Relative 03/27/2019 13  % Final  . Monocytes Absolute 03/27/2019 1.3* 0.1 - 1.0 K/uL Final  . Eosinophils Relative 03/27/2019 4  % Final  . Eosinophils Absolute 03/27/2019 0.4  0.0 - 0.5 K/uL Final  . Basophils Relative 03/27/2019 0  % Final  . Basophils Absolute 03/27/2019 0.0  0.0 - 0.1 K/uL Final  . Immature Granulocytes 03/27/2019 1  % Final  . Abs Immature Granulocytes 03/27/2019 0.11* 0.00 - 0.07 K/uL Final   Performed at [facility], [address] [place], [place] [address]    STUDIES: No results found.   ASSESSMENT: History of diffuse large B-cell lymphoma.  PLAN:    1. Diffuse Large B Cell Lymphoma: Patient was initially diagnosed in August 2015, pathology was noted to be anaplastic subtype.  He completed 6 cycles of R-CHOP chemotherapy in January 2016. PET scan January 2017 revealed no evidence of disease.  2.  Urothelial carcinoma: Confirmed by biopsy.  Patient's most recent CT scan on March 01, 2019 reviewed independently and reported as above with progressive left pelvic mass greater than 4 cm.  Patient appears to have no other evidence of disease.  Case discussed with urology.  Given  the fact that patient is symptomatic with pain and has localized disease, he will benefit from concurrent chemotherapy using weekly cisplatin 40 mg/m along with daily XRT.  Delay cycle 1 of cisplatin today secondary to elevated creatinine.  Continue daily XRT.  Return to clinic in 1 week for further evaluation and reconsideration of cycle 2.   3.  Hydronephrosis: Patient had ureteral stent placed last week. 4.  Renal insufficiency: Patient's creatinine is worse this week at 2.30.  Delay cisplatin as above.  Patient will instead receive 1 L of IV fluids. 5.  Pain: Improved.  Continue fentanyl patch and  oxycodone. 6.  Anemia: Patient's hemoglobin has trended down slightly to 10.1, monitor. 7.  DVT: Appreciate vascular surgery input.  Patient will require Eliquis for minimum of 6 months until at least February 2021.   Patient expressed understanding and was in agreement with this plan. He also understands that He can call clinic at any time with any questions, concerns, or complaints.    Lloyd Huger, MD   03/28/2019 6:51 AM

## 2019-03-26 ENCOUNTER — Other Ambulatory Visit: Payer: Self-pay

## 2019-03-26 ENCOUNTER — Ambulatory Visit (INDEPENDENT_AMBULATORY_CARE_PROVIDER_SITE_OTHER): Payer: PPO | Admitting: Family Medicine

## 2019-03-26 ENCOUNTER — Telehealth (INDEPENDENT_AMBULATORY_CARE_PROVIDER_SITE_OTHER): Payer: Self-pay | Admitting: Vascular Surgery

## 2019-03-26 ENCOUNTER — Encounter: Payer: Self-pay | Admitting: Family Medicine

## 2019-03-26 ENCOUNTER — Ambulatory Visit: Payer: PPO

## 2019-03-26 VITALS — BP 97/58 | HR 105 | Temp 98.6°F | Wt 235.0 lb

## 2019-03-26 DIAGNOSIS — C689 Malignant neoplasm of urinary organ, unspecified: Secondary | ICD-10-CM | POA: Insufficient documentation

## 2019-03-26 DIAGNOSIS — I82412 Acute embolism and thrombosis of left femoral vein: Secondary | ICD-10-CM

## 2019-03-26 DIAGNOSIS — Z510 Encounter for antineoplastic radiation therapy: Secondary | ICD-10-CM | POA: Diagnosis not present

## 2019-03-26 DIAGNOSIS — I959 Hypotension, unspecified: Secondary | ICD-10-CM

## 2019-03-26 MED ORDER — HYDROCHLOROTHIAZIDE 25 MG PO TABS: 12.5000 mg | ORAL_TABLET | Freq: Every day | ORAL | Status: AC

## 2019-03-26 NOTE — Progress Notes (Signed)
Patient: Bryan Nelson Male    DOB: 1932-08-20   85 y.o.   MRN: [id number] Visit Date: 03/26/2019  Today's Provider: Lelon Huh, MD   No chief complaint on file.  Subjective:     HPI     Follow up Hospitalization  Patient was admitted to [facility] on 03/22/2019 and discharged on 03/24/2019. He was treated for DVT with thrombophlebitis. . Treatment for this included prescribed Eliquis which he has not yet started. Is still taking ASA and clopidogrel  He reports excellent compliance with treatment. He reports this condition is Unchanged.  Pt states "I just feel terrible"  He was also admitted overnight on 03/16/2019 for angioedema. Was on Augmentin and benazpril-hctz 5/6.25 at the time which were both discontinued and hctz 34m was started.   He has follow up oncology tomorrow.  ------------------------------------------------------------------------------------     Allergies  Allergen Reactions  . Augmentin [Amoxicillin-Pot Clavulanate]     Tongue swelling 2 days after taking  . Ciprofloxacin Diarrhea  . Lisinopril Other (See Comments)    Unknown   . Penicillins Swelling    unknwon reaction.  tolerates amoxicillin Has patient had a PCN reaction causing immediate rash, facial/tongue/throat swelling, SOB or lightheadedness with hypotension: No Has patient had a PCN reaction causing severe rash involving mucus membranes or skin necrosis: No Has patient had a PCN reaction that required hospitalization: No Has patient had a PCN reaction occurring within the last 10 years: Yes If all of the above answers are "NO", then may proceed with Cephalosporin use.   . Bactrim [Sulfamethoxazole-Trimethoprim] Rash  . Sulfa Antibiotics Itching and Rash     Current Outpatient Medications:  .  atorvastatin (LIPITOR) 10 MG tablet, Take 1 tablet (10 mg total) by mouth daily., Disp: 30 tablet, Rfl: 11 .  Cholecalciferol (VITAMIN D3) 1000 units CAPS, Take 1,000 Units by mouth daily.,  Disp: , Rfl:  .  clopidogrel (PLAVIX) 75 MG tablet, Take 1 tablet (75 mg total) by mouth daily., Disp: 90 tablet, Rfl: 4 .  famotidine (PEPCID) 20 MG tablet, Take 1 tablet (20 mg total) by mouth daily., Disp: 180 tablet, Rfl: 3 .  finasteride (PROSCAR) 5 MG tablet, Take 1 tablet (5 mg total) by mouth daily., Disp: 90 tablet, Rfl: 3 .  gabapentin (NEURONTIN) 300 MG capsule, Take 1 capsule (300 mg total) by mouth at bedtime. Do not take if you take temazepam, Disp: 30 capsule, Rfl: 6 .  hydrochlorothiazide (HYDRODIURIL) 25 MG tablet, Take 1 tablet (25 mg total) by mouth daily., Disp: 30 tablet, Rfl: 0 .  isosorbide dinitrate (ISORDIL) 30 MG tablet, Take 30 mg by mouth daily., Disp: , Rfl:  .  Magnesium 250 MG TABS, Take 250 mg by mouth daily., Disp: , Rfl:  .  metoprolol succinate (TOPROL-XL) 25 MG 24 hr tablet, Take 1 tablet (25 mg total) by mouth daily., Disp: 90 tablet, Rfl: 4 .  Multiple Vitamins-Minerals (VISION FORMULA 2 PO), Take by mouth daily., Disp: , Rfl:  .  naproxen sodium (ALEVE) 220 MG tablet, Take 220 mg by mouth daily as needed., Disp: , Rfl:  .  psyllium (METAMUCIL) 58.6 % packet, Take 1 packet by mouth daily., Disp: , Rfl:  .  ALPRAZolam (XANAX) 0.5 MG tablet, Take 1 tablet (0.5 mg total) by mouth at bedtime as needed for anxiety., Disp: 30 tablet, Rfl: 1 .  apixaban (ELIQUIS) 5 MG TABS tablet, Take 1 tablet (5 mg total) by mouth 2 (two) times daily.,  Disp: 60 tablet, Rfl: 3 .  aspirin EC 81 MG EC tablet, Take 1 tablet (81 mg total) by mouth daily. (Patient not taking: Reported on 03/26/2019), Disp: 90 tablet, Rfl: 3 .  ondansetron (ZOFRAN) 8 MG tablet, Take 1 tablet (8 mg total) by mouth 2 (two) times daily as needed for nausea or vomiting. (Patient not taking: Reported on 03/26/2019), Disp: 20 tablet, Rfl: 3 .  prochlorperazine (COMPAZINE) 10 MG tablet, Take 1 tablet (10 mg total) by mouth every 6 (six) hours as needed for nausea or vomiting. (Patient not taking: Reported on 03/26/2019),  Disp: 30 tablet, Rfl: 3  Review of Systems  Constitutional: Positive for fatigue. Negative for activity change, appetite change, chills, diaphoresis, fever and unexpected weight change.  Respiratory: Negative.   Cardiovascular: Negative.   Gastrointestinal: Negative.   Neurological: Negative for dizziness, light-headedness and headaches.    Social History   Tobacco Use  . Smoking status: Former Smoker    Packs/day: 1.50    Years: 12.00    Pack years: 18.00    Types: Cigarettes    Quit date: 08/24/1971    Years since quitting: 47.6  . Smokeless tobacco: Never Used  Substance Use Topics  . Alcohol use: No    Alcohol/week: 0.0 standard drinks      Objective:   BP (!) 97/58 (BP Location: Right Arm, Patient Position: Sitting, Cuff Size: Normal)   Pulse (!) 105   Temp 98.6 F (37 C) (Oral)   Wt 235 lb (106.6 kg)   BMI 31.00 kg/m  Vitals:   03/26/19 1510  BP: (!) 97/58  Pulse: (!) 105  Temp: 98.6 F (37 C)  TempSrc: Oral  Weight: 235 lb (106.6 kg)     Physical Exam   General Appearance:    Alert, cooperative, no distress, fatigued appearing.   Eyes:    PERRL, conjunctiva/corneas clear, EOM's intact       Lungs:     Clear to auscultation bilaterally, respirations unlabored  Heart:    Normal heart rate. Normal rhythm. No murmurs, rubs, or gallops.   Neurologic:   Awake, alert, oriented x 3. No apparent focal neurological           defect.          Assessment & Plan    1. Hypotension, unspecified hypotension type Recently changed from benazepril-hctz 5/6.25 to hctz 25. Will reduce to 1/2 of 25mg  tablet daily.   2. DVT of deep femoral vein, left (HCC) Has not yet started Eliquis. Advised he needs to go ahead and pick up prescription right away and start taking in place of ECASA and clopidogrel.  - hydrochlorothiazide (HYDRODIURIL) 25 MG tablet; Take 0.5 tablets (12.5 mg total) by mouth daily.  Follow up 3 weeks.     Lelon Huh, MD  [facility]

## 2019-03-26 NOTE — Telephone Encounter (Signed)
[name] with [employer] [phone] calling requesting PT orders. Patientt was d/c from [facility] on 03/23/19 and [name] was told by [facility] that we would be placing PT orders but she has not received them. Please advise. AS, CMA  Fax orders to [phone]

## 2019-03-26 NOTE — Telephone Encounter (Signed)
Per [facility] for PT order. Order filled out and sent to [phone] with demographics and copy insurance card. AS, CMA

## 2019-03-26 NOTE — Patient Instructions (Addendum)
.   Please review the attached list of medications and notify my office if there are any errors.    Pick up prescription for apixaban (Eliquis) at [facility]   Stop taking aspirin and Plavix when you start on Eliquis   Start taking 1/2 tablet of 25mg  hctz daily

## 2019-03-27 ENCOUNTER — Encounter: Payer: Self-pay | Admitting: Oncology

## 2019-03-27 ENCOUNTER — Inpatient Hospital Stay: Payer: PPO | Attending: Oncology

## 2019-03-27 ENCOUNTER — Other Ambulatory Visit: Payer: Self-pay

## 2019-03-27 ENCOUNTER — Inpatient Hospital Stay (HOSPITAL_BASED_OUTPATIENT_CLINIC_OR_DEPARTMENT_OTHER): Payer: PPO | Admitting: Oncology

## 2019-03-27 ENCOUNTER — Inpatient Hospital Stay (HOSPITAL_BASED_OUTPATIENT_CLINIC_OR_DEPARTMENT_OTHER): Payer: PPO | Admitting: Nurse Practitioner

## 2019-03-27 ENCOUNTER — Inpatient Hospital Stay: Payer: PPO

## 2019-03-27 ENCOUNTER — Ambulatory Visit
Admission: RE | Admit: 2019-03-27 | Discharge: 2019-03-27 | Disposition: A | Discharge status: Home / Self Care | Payer: PPO | Source: Ambulatory Visit | Attending: Radiation Oncology | Admitting: Radiation Oncology

## 2019-03-27 VITALS — BP 113/71 | HR 82 | Temp 96.1°F | Ht 73.0 in | Wt 233.0 lb

## 2019-03-27 DIAGNOSIS — D649 Anemia, unspecified: Secondary | ICD-10-CM | POA: Insufficient documentation

## 2019-03-27 DIAGNOSIS — R413 Other amnesia: Secondary | ICD-10-CM | POA: Insufficient documentation

## 2019-03-27 DIAGNOSIS — R339 Retention of urine, unspecified: Secondary | ICD-10-CM

## 2019-03-27 DIAGNOSIS — N289 Disorder of kidney and ureter, unspecified: Secondary | ICD-10-CM | POA: Insufficient documentation

## 2019-03-27 DIAGNOSIS — C679 Malignant neoplasm of bladder, unspecified: Secondary | ICD-10-CM | POA: Insufficient documentation

## 2019-03-27 DIAGNOSIS — C689 Malignant neoplasm of urinary organ, unspecified: Secondary | ICD-10-CM | POA: Diagnosis not present

## 2019-03-27 DIAGNOSIS — Z5111 Encounter for antineoplastic chemotherapy: Secondary | ICD-10-CM | POA: Diagnosis not present

## 2019-03-27 DIAGNOSIS — R350 Frequency of micturition: Secondary | ICD-10-CM | POA: Insufficient documentation

## 2019-03-27 DIAGNOSIS — Z515 Encounter for palliative care: Secondary | ICD-10-CM | POA: Insufficient documentation

## 2019-03-27 DIAGNOSIS — Z510 Encounter for antineoplastic radiation therapy: Secondary | ICD-10-CM | POA: Diagnosis not present

## 2019-03-27 DIAGNOSIS — R300 Dysuria: Secondary | ICD-10-CM | POA: Insufficient documentation

## 2019-03-27 DIAGNOSIS — C8330 Diffuse large B-cell lymphoma, unspecified site: Secondary | ICD-10-CM | POA: Insufficient documentation

## 2019-03-27 DIAGNOSIS — I82402 Acute embolism and thrombosis of unspecified deep veins of left lower extremity: Secondary | ICD-10-CM | POA: Insufficient documentation

## 2019-03-27 DIAGNOSIS — R3911 Hesitancy of micturition: Secondary | ICD-10-CM | POA: Insufficient documentation

## 2019-03-27 DIAGNOSIS — Z7901 Long term (current) use of anticoagulants: Secondary | ICD-10-CM | POA: Insufficient documentation

## 2019-03-27 DIAGNOSIS — N1330 Unspecified hydronephrosis: Secondary | ICD-10-CM | POA: Insufficient documentation

## 2019-03-27 LAB — BASIC METABOLIC PANEL
Anion gap: 10 (ref 5–15)
BUN: 52 mg/dL — ABNORMAL HIGH (ref 8–23)
CO2: 28 mmol/L (ref 22–32)
Calcium: 8.8 mg/dL — ABNORMAL LOW (ref 8.9–10.3)
Chloride: 101 mmol/L (ref 98–111)
Creatinine, Ser: 2.3 mg/dL — ABNORMAL HIGH (ref 0.61–1.24)
GFR calc Af Amer: 29 mL/min — ABNORMAL LOW (ref >60)
GFR calc non Af Amer: 25 mL/min — ABNORMAL LOW (ref >60)
Glucose, Bld: 103 mg/dL — ABNORMAL HIGH (ref 70–99)
Potassium: 3.9 mmol/L (ref 3.5–5.1)
Sodium: 139 mmol/L (ref 135–145)

## 2019-03-27 LAB — CBC WITH DIFFERENTIAL/PLATELET
Abs Immature Granulocytes: 0.11 10*3/uL — ABNORMAL HIGH (ref 0.00–0.07)
Basophils Absolute: 0 10*3/uL (ref 0.0–0.1)
Basophils Relative: 0 %
Eosinophils Absolute: 0.4 10*3/uL (ref 0.0–0.5)
Eosinophils Relative: 4 %
HCT: 31.1 % — ABNORMAL LOW (ref 39.0–52.0)
Hemoglobin: 10.1 g/dL — ABNORMAL LOW (ref 13.0–17.0)
Immature Granulocytes: 1 %
Lymphocytes Relative: 8 %
Lymphs Abs: 0.8 10*3/uL (ref 0.7–4.0)
MCH: 27.8 pg (ref 26.0–34.0)
MCHC: 32.5 g/dL (ref 30.0–36.0)
MCV: 85.7 fL (ref 80.0–100.0)
Monocytes Absolute: 1.3 10*3/uL — ABNORMAL HIGH (ref 0.1–1.0)
Monocytes Relative: 13 %
Neutro Abs: 7.1 10*3/uL (ref 1.7–7.7)
Neutrophils Relative %: 74 %
Platelets: 304 10*3/uL (ref 150–400)
RBC: 3.63 MIL/uL — ABNORMAL LOW (ref 4.22–5.81)
RDW: 14.7 % (ref 11.5–15.5)
WBC: 9.6 10*3/uL (ref 4.0–10.5)
nRBC: 0 % (ref 0.0–0.2)

## 2019-03-27 MED ORDER — ZOLPIDEM TARTRATE 10 MG PO TABS: 10.0000 mg | ORAL_TABLET | Freq: Every evening | ORAL | 0 refills | Status: DC | PRN

## 2019-03-27 MED ORDER — SODIUM CHLORIDE 0.9 % IV SOLN
Freq: Once | INTRAVENOUS | Status: AC
  Administered 2019-03-27: 10:00:00 via INTRAVENOUS
  Filled 2019-03-27: qty 250

## 2019-03-27 NOTE — Progress Notes (Signed)
Erroneous encounter

## 2019-03-27 NOTE — Progress Notes (Signed)
Patient went to the hospital on 03/22/2019 for blood clot on his left leg and that he had stents placed.

## 2019-03-28 ENCOUNTER — Encounter: Payer: Self-pay | Admitting: Hospice and Palliative Medicine

## 2019-03-28 ENCOUNTER — Ambulatory Visit
Admission: RE | Admit: 2019-03-28 | Discharge: 2019-03-28 | Disposition: A | Discharge status: Home / Self Care | Payer: PPO | Source: Ambulatory Visit | Attending: Radiation Oncology | Admitting: Radiation Oncology

## 2019-03-28 ENCOUNTER — Other Ambulatory Visit: Payer: Self-pay

## 2019-03-28 ENCOUNTER — Inpatient Hospital Stay (HOSPITAL_BASED_OUTPATIENT_CLINIC_OR_DEPARTMENT_OTHER): Payer: PPO | Admitting: Hospice and Palliative Medicine

## 2019-03-28 ENCOUNTER — Telehealth: Payer: Self-pay | Admitting: *Deleted

## 2019-03-28 VITALS — BP 128/74 | HR 80 | Temp 98.7°F | Resp 18 | Wt 236.2 lb

## 2019-03-28 DIAGNOSIS — Z515 Encounter for palliative care: Secondary | ICD-10-CM

## 2019-03-28 DIAGNOSIS — C689 Malignant neoplasm of urinary organ, unspecified: Secondary | ICD-10-CM | POA: Diagnosis not present

## 2019-03-28 DIAGNOSIS — Z5111 Encounter for antineoplastic chemotherapy: Secondary | ICD-10-CM | POA: Diagnosis not present

## 2019-03-28 DIAGNOSIS — Z7189 Other specified counseling: Secondary | ICD-10-CM

## 2019-03-28 DIAGNOSIS — Z510 Encounter for antineoplastic radiation therapy: Secondary | ICD-10-CM | POA: Diagnosis not present

## 2019-03-28 NOTE — Telephone Encounter (Signed)
Niece Bryan Nelson who has POA called reporting that she is concerned that patient is not remembering things, not taking his medicines properly even though they have filled a pill box for him. She is concerned that he was sitting out in the 100 degree heat asleep after his last treatment (she is not blaming the cancer center staff for this, she blames patient) He lives alone and his relatives live 2 hours away. Of note, he has lost 6 pounds in the last week. She is asking if home health could be ordered to check on him and if he is taking his medicine properly. She was checking to see why he did not get his chemotherapy and I explained to her that his kidney functions were abnormal so it was held and that future treatments would be determined by his lab results on the day of treatment. She voiced understanding. Please advise

## 2019-03-28 NOTE — Telephone Encounter (Signed)
Thanks you and yes home heath is a good idea.

## 2019-03-28 NOTE — Progress Notes (Signed)
Bryan Nelson  [phone] [phone]   Name: Bryan Nelson Date: 03/28/2019 MRN: [id number]  DOB: March 06, 1932  Patient Care Team: [name], MD as PCP - General (Family Medicine) Dingeldein, Remo Lipps, MD as Consulting Physician (Ophthalmology) Hollice Espy, MD as Consulting Physician (Urology) Yolonda Kida, MD as Consulting Physician (Cardiology) Laneta Simmers as Physician Assistant (Urology)    REASON FOR CONSULTATION: Palliative Care consult requested for this 85 y.o. male with multiple medical problems including history of diffuse large B-cell lymphoma (initially diagnosed August 2015) who is status post 6 cycles of R-CHOP chemotherapy.  Abdominal CT scan February 01, 2019 revealed a 5 cm soft tissue mass along the left pelvic sidewall obstructing the left ureter.  Biopsy revealed high-grade urothelial carcinoma with squamous differentiation.  Patient was referred to palliative care to help address goals.   SOCIAL HISTORY:     reports that he quit smoking about 47 years ago. His smoking use included cigarettes. He has a 18.00 pack-year smoking history. He has never used smokeless tobacco. He reports that he does not drink alcohol or use drugs.   Patient is a widower.  He has no children.  He lives at home alone.  Patient's nephew is involved in his care.  ADVANCE DIRECTIVES:  Patient reports that his nephew is his healthcare power of attorney  CODE STATUS:   PAST MEDICAL HISTORY: Past Medical History:  Diagnosis Date   Arteriosclerosis of coronary artery 08/26/2011   Overview:      a.  1999 PCI of the mid LAD with stent.      b.  2002 Cath [facility]: EF 56%. RCA-distal 25/25%. Left main-50% ostial.  Left circumflex-25% OM2.  LAD-25% proximal.  75% mid.  25/25% distal. 75% D1.      c.  07/2011 PCI of RCA with DES.[facility]    Bleeding gastrointestinal 08/26/2011   Overview:      a.  Chronic  abdominal pain, present improving.      b.  Duodenitis and gastritis by EGD in 2000.      c.  Pylori in 2000, treated.      d.  Gastroesophageal reflux disease.      e.  Diverticular disease.     BP (high blood pressure) 08/26/2011   Closed dislocation of right ankle 09/10/2016   Successfully reduced in ER 09/10/2016   Diffuse large B-cell lymphoma ([place]) 2015   chemo tx's   GERD (gastroesophageal reflux disease)    Heart disease    Helicobacter pylori infection 06/18/2015   by EGD RX 08/18/1999    History of adenomatous polyp of colon 06/18/2015   HOH (hard of hearing)    Bilateral Hearing  Aids   Malleolar fracture (Right) 09/10/2016    PAST SURGICAL HISTORY:  Past Surgical History:  Procedure Laterality Date   ANGIOPLASTY     CATARACT EXTRACTION     COLONOSCOPY  2016   CORONARY ANGIOPLASTY     CYSTOSCOPY WITH STENT PLACEMENT Left 03/17/2019   Procedure: CYSTOSCOPY WITH STENT PLACEMENT;  Surgeon: Festus Aloe, MD;  Location: [facility];  Service: Urology;  Laterality: Left;   EYE SURGERY Bilateral    Cataract Extraction with IOL   GREEN LIGHT LASER TURP (TRANSURETHRAL RESECTION OF PROSTATE N/A 04/16/2015   Procedure: GREEN LIGHT LASER TURP (TRANSURETHRAL RESECTION OF PROSTATE WITH BLADDER BIOPSY;  Surgeon: Collier Flowers, MD;  Location: [facility];  Service: Urology;  Laterality: N/A;  heart stent placement     1998, 2000, 2012   PERIPHERAL VASCULAR THROMBECTOMY Left 03/22/2019   Procedure: PERIPHERAL VASCULAR THROMBECTOMY and possible IVC filter;  Surgeon: Algernon Huxley, MD;  Location: [facility];  Service: Cardiovascular;  Laterality: Left;   TRANSURETHRAL RESECTION OF PROSTATE N/A 03/02/2017   Procedure: TRANSURETHRAL RESECTION OF THE PROSTATE (TURP);  Surgeon: Hollice Espy, MD;  Location: [facility];  Service: Urology;  Laterality: N/A;    HEMATOLOGY/ONCOLOGY HISTORY:  Oncology History   No history exists.    ALLERGIES:  is allergic to augmentin  [amoxicillin-pot clavulanate]; ciprofloxacin; lisinopril; penicillins; bactrim [sulfamethoxazole-trimethoprim]; and sulfa antibiotics.  MEDICATIONS:  Current Outpatient Medications  Medication Sig Dispense Refill   ALPRAZolam (XANAX) 0.5 MG tablet Take 1 tablet (0.5 mg total) by mouth at bedtime as needed for anxiety. 30 tablet 1   apixaban (ELIQUIS) 5 MG TABS tablet Take 1 tablet (5 mg total) by mouth 2 (two) times daily. 60 tablet 3   atorvastatin (LIPITOR) 10 MG tablet Take 1 tablet (10 mg total) by mouth daily. 30 tablet 11   Cholecalciferol (VITAMIN D3) 1000 units CAPS Take 1,000 Units by mouth daily.     famotidine (PEPCID) 20 MG tablet Take 1 tablet (20 mg total) by mouth daily. 180 tablet 3   finasteride (PROSCAR) 5 MG tablet Take 1 tablet (5 mg total) by mouth daily. 90 tablet 3   gabapentin (NEURONTIN) 300 MG capsule Take 1 capsule (300 mg total) by mouth at bedtime. Do not take if you take temazepam 30 capsule 6   hydrochlorothiazide (HYDRODIURIL) 25 MG tablet Take 0.5 tablets (12.5 mg total) by mouth daily.     isosorbide dinitrate (ISORDIL) 30 MG tablet Take 30 mg by mouth daily.     Magnesium 250 MG TABS Take 250 mg by mouth daily.     metoprolol succinate (TOPROL-XL) 25 MG 24 hr tablet Take 1 tablet (25 mg total) by mouth daily. 90 tablet 4   Multiple Vitamins-Minerals (VISION FORMULA 2 PO) Take by mouth daily.     naproxen sodium (ALEVE) 220 MG tablet Take 220 mg by mouth daily as needed.     ondansetron (ZOFRAN) 8 MG tablet Take 1 tablet (8 mg total) by mouth 2 (two) times daily as needed for nausea or vomiting. 20 tablet 3   prochlorperazine (COMPAZINE) 10 MG tablet Take 1 tablet (10 mg total) by mouth every 6 (six) hours as needed for nausea or vomiting. 30 tablet 3   psyllium (METAMUCIL) 58.6 % packet Take 1 packet by mouth daily.     zolpidem (AMBIEN) 10 MG tablet Take 1 tablet (10 mg total) by mouth at bedtime as needed for sleep. 30 tablet 0   No current  facility-administered medications for this visit.     VITAL SIGNS: BP 128/74 (BP Location: Left Arm, Patient Position: Sitting)    Pulse 80    Temp 98.7 F (37.1 C) (Tympanic)    Resp 18    Wt 236 lb 3.2 oz (107.1 kg)    BMI 31.16 kg/m  Filed Weights   03/28/19 1352  Weight: 236 lb 3.2 oz (107.1 kg)    Estimated body mass index is 31.16 kg/m as calculated from the following:   Height as of 03/27/19: 6\' 1"  (1.854 m).   Weight as of this encounter: 236 lb 3.2 oz (107.1 kg).  LABS: CBC:    Component Value Date/Time   WBC 9.6 03/27/2019 0855   HGB 10.1 (L) 03/27/2019 5427  HGB 13.1 08/10/2018 0821   HCT 31.1 (L) 03/27/2019 0855   HCT 38.9 08/10/2018 0821   PLT 304 03/27/2019 0855   PLT 240 08/10/2018 0821   MCV 85.7 03/27/2019 0855   MCV 88 08/10/2018 0821   MCV 91 09/16/2014 0928   NEUTROABS 7.1 03/27/2019 0855   NEUTROABS 2.4 05/07/2016 1055   NEUTROABS 2.3 09/16/2014 0928   LYMPHSABS 0.8 03/27/2019 0855   LYMPHSABS 0.8 05/07/2016 1055   LYMPHSABS 0.5 (L) 09/16/2014 0928   MONOABS 1.3 (H) 03/27/2019 0855   MONOABS 0.7 09/16/2014 0928   EOSABS 0.4 03/27/2019 0855   EOSABS 0.5 (H) 05/07/2016 1055   EOSABS 0.2 09/16/2014 0928   BASOSABS 0.0 03/27/2019 0855   BASOSABS 0.0 05/07/2016 1055   BASOSABS 0.1 09/16/2014 0928   Comprehensive Metabolic Panel:    Component Value Date/Time   NA 139 03/27/2019 0855   NA 140 11/09/2018 0908   NA 144 09/16/2014 0928   K 3.9 03/27/2019 0855   K 3.5 09/16/2014 0928   CL 101 03/27/2019 0855   CL 105 09/16/2014 0928   CO2 28 03/27/2019 0855   CO2 31 09/16/2014 0928   BUN 52 (H) 03/27/2019 0855   BUN 19 11/09/2018 0908   BUN 17 09/16/2014 0928   CREATININE 2.30 (H) 03/27/2019 0855   CREATININE 1.01 09/16/2014 0928   GLUCOSE 103 (H) 03/27/2019 0855   GLUCOSE 106 (H) 09/16/2014 0928   CALCIUM 8.8 (L) 03/27/2019 0855   CALCIUM 8.4 (L) 09/16/2014 0928   AST 18 03/22/2019 0651   AST 20 09/16/2014 0928   ALT 21 03/22/2019 0651    ALT 33 09/16/2014 0928   ALKPHOS 57 03/22/2019 0651   ALKPHOS 76 09/16/2014 0928   BILITOT 0.7 03/22/2019 0651   BILITOT 0.3 11/09/2018 0908   BILITOT 0.3 09/16/2014 0928   PROT 7.4 03/22/2019 0651   PROT 6.5 11/09/2018 0908   PROT 6.3 (L) 09/16/2014 0928   ALBUMIN 3.7 03/22/2019 0651   ALBUMIN 4.3 11/09/2018 0908   ALBUMIN 3.4 09/16/2014 0928    RADIOGRAPHIC STUDIES: Ct Abdomen Pelvis Wo Contrast  Result Date: 03/01/2019 CLINICAL DATA:  Progressive left-sided abdominal pain. Invasive high-grade urothelial carcinoma with squamous differentiation. EXAM: CT ABDOMEN AND PELVIS WITHOUT CONTRAST TECHNIQUE: Multidetector CT imaging of the abdomen and pelvis was performed following the standard protocol without IV contrast. COMPARISON:  CT scan dated 02/01/2019 FINDINGS: Lower chest: Aortic atherosclerosis. Extensive coronary artery calcifications. Heart size is normal. Minimal atelectasis at the left lung base. No effusions. Hepatobiliary: No focal liver abnormality is seen. No gallstones, gallbladder wall thickening, or biliary dilatation. Pancreas: Unremarkable. No pancreatic ductal dilatation or surrounding inflammatory changes. Spleen: Normal in size without focal abnormality. Adrenals/Urinary Tract: There has been progression of the irregular mass in the left side of the pelvis which probably arises from the superior aspect of a left-sided bladder diverticulum. The mass now measures approximately 4.2 x 4.2 x 4.0 cm. There is persistent left hydronephrosis due to distal ureteral obstruction by the mass. This is unchanged. There is slight soft tissue stranding from the mass to the left superolateral aspect of the dome of the bladder, probably representing tumor, new since the prior exam. There is a new nodular extension of the mass anteriorly seen on image 73 of series 2, 16 mm in diameter. There is new soft tissue stranding extending to the sigmoid portion of the colon best seen on images 76 and 77 of  series 2. The mass also extends into the  left psoas muscle on image 71 of series 2. Stomach/Bowel: There are few diverticula in the sigmoid colon. Other than the tumor stranding toward the sigmoid colon as described above, the bowel appears normal. Appendix is normal. Vascular/Lymphatic: Extensive aortic atherosclerosis. Reproductive: Prostate is unremarkable. Other: No free air or free fluid or abdominal wall hernia. Musculoskeletal: No acute abnormalities. Multilevel degenerative disc disease. Old anterior wedge deformity of T12. IMPRESSION: 1. There has been progression of the irregular mass in the left side of the pelvis which probably arises from the superior aspect of a left-sided bladder diverticulum. The mass now measures approximately 4.2 x 4.2 x 4.0 cm. The mass now has soft tissue stranding to the left psoas muscle, sigmoid portion of the colon, and the dome of the bladder. 2. Persistent left hydronephrosis due to distal ureteral obstruction by the mass. 3. Aortic atherosclerosis and coronary artery calcifications. 4. Sigmoid diverticulosis without diverticulitis. 5. Old anterior wedge deformity of T12. Aortic Atherosclerosis (ICD10-I70.0). Electronically Signed   By: Lorriane Shire M.D.   On: 03/01/2019 07:30   US Venous Img Lower Unilateral Left  Result Date: 03/22/2019 CLINICAL DATA:  Left lower extremity pain and edema since 6 a.m. this morning. Evaluate for DVT. EXAM: LEFT LOWER EXTREMITY VENOUS DOPPLER ULTRASOUND TECHNIQUE: Gray-scale sonography with graded compression, as well as color Doppler and duplex ultrasound were performed to evaluate the lower extremity deep venous systems from the level of the common femoral vein and including the common femoral, femoral, profunda femoral, popliteal and calf veins including the posterior tibial, peroneal and gastrocnemius veins when visible. The superficial great saphenous vein was also interrogated. Spectral Doppler was utilized to evaluate flow at  rest and with distal augmentation maneuvers in the common femoral, femoral and popliteal veins. COMPARISON:  None. FINDINGS: Contralateral Common Femoral Vein: Respiratory phasicity is normal and symmetric with the symptomatic side. No evidence of thrombus. Normal compressibility. There is hypoechoic occlusive thrombus within the left common femoral vein (image 7). There is a minimal amount of nonocclusive thrombus involving the proximal aspect of the greater saphenous vein extending to the saphenofemoral junction (image 10). There is hypoechoic occlusive SVT within the greater saphenous vein at the level the mid thigh (image 34). There is hypoechoic occlusive thrombus within the imaged portions the left deep femoral vein (image 13). There is hypoechoic occlusive thrombus involving the proximal (image 16), mid (image 19) and distal (image 22) aspects of the femoral vein, extending to involve the proximal (image 25) and distal (image 28) aspects of the popliteal vein extending to involve both paired posterior tibial veins (image 31 and both paired peroneal veins (image 33). Other Findings:  None. IMPRESSION: 1. Examination is positive for extensive occlusive DVT extending from the left common femoral vein through the left tibial veins. 2. Occlusive superficial thrombophlebitis involving the greater saphenous vein at the level of the mid thigh. Electronically Signed   By: Sandi Mariscal M.D.   On: 03/22/2019 08:32   Dg C-arm 1-60 Min-no Report  Result Date: 03/17/2019 Fluoroscopy was utilized by the requesting physician.  No radiographic interpretation.    PERFORMANCE STATUS (ECOG) : 1 - Symptomatic but completely ambulatory  Review of Systems Unless otherwise noted, a complete review of systems is negative.  Physical Exam General: NAD, frail appearing, thin Pulmonary: Unlabored Extremities: no edema Skin: no rashes Neurological: Weakness but otherwise nonfocal  IMPRESSION: Routine follow-up visit  today in the clinic.  He was hospitalized 7/started 8/1 for treatment of a DVT.  Patient  feels he is doing well post hospitalization.  He has no concerns during today's visit.  Symptomatically, patient reports that pain is stable.  He continues to live at home and feels he is independent with care.  However, I did receive a message from his niece that family had concerns with his forgetfulness and his ability to provide for self-care in the home.  Home health was requested and I do feel it would be clinically appropriate.  We will also ask that home-based palliative care follow him in the home.  I again reviewed with patient a MOST Form but he was undecided about decisions.  He took at home with him to think about.  I tried calling patient's niece but did not reach her.  PLAN: -Continue current prescription treatment -MOST Form reviewed and will need to be completed at time of future visit -Home health -Referral to community-based palliative care -RTC in 1 month   Patient expressed understanding and was in agreement with this plan. He also understands that He can call the clinic at any time with any questions, concerns, or complaints.     Time Total: 20 minutes  Visit consisted of counseling and education dealing with the complex and emotionally intense issues of symptom management and palliative care in the setting of serious and potentially life-threatening illness.Greater than 50%  of this time was spent counseling and coordinating care related to the above assessment and plan.  Signed by: Altha Harm, PhD, NP-C [phone] (Work Cell)

## 2019-03-28 NOTE — Progress Notes (Signed)
Pt reports has had some diarrhea past 2 days, started antidiarrheal but unsure of name.  States has helped some.

## 2019-03-29 ENCOUNTER — Ambulatory Visit: Payer: PPO

## 2019-03-29 ENCOUNTER — Ambulatory Visit
Admission: RE | Admit: 2019-03-29 | Discharge: 2019-03-29 | Disposition: A | Discharge status: Home / Self Care | Payer: PPO | Source: Ambulatory Visit | Attending: Radiation Oncology | Admitting: Radiation Oncology

## 2019-03-29 ENCOUNTER — Other Ambulatory Visit: Payer: Self-pay

## 2019-03-29 ENCOUNTER — Telehealth: Payer: Self-pay | Admitting: Hospice and Palliative Medicine

## 2019-03-29 ENCOUNTER — Other Ambulatory Visit: Payer: Self-pay | Admitting: Oncology

## 2019-03-29 DIAGNOSIS — R300 Dysuria: Secondary | ICD-10-CM

## 2019-03-29 DIAGNOSIS — Z510 Encounter for antineoplastic radiation therapy: Secondary | ICD-10-CM | POA: Diagnosis not present

## 2019-03-29 DIAGNOSIS — C689 Malignant neoplasm of urinary organ, unspecified: Secondary | ICD-10-CM | POA: Diagnosis not present

## 2019-03-29 NOTE — Telephone Encounter (Signed)
Called and spoke with patient. He complains of urinary urgency and frequency, worse at night. Has occasional burning with urination. No fevers or chills. Patient scheduled for [facility] visit tomorrow. Will obtain UA/C&S.  Case discussed with Sonia Baller, NP.

## 2019-03-30 ENCOUNTER — Ambulatory Visit: Payer: PPO

## 2019-03-30 ENCOUNTER — Other Ambulatory Visit: Payer: Self-pay | Admitting: *Deleted

## 2019-03-30 ENCOUNTER — Ambulatory Visit
Admission: RE | Admit: 2019-03-30 | Discharge: 2019-03-30 | Disposition: A | Discharge status: Home / Self Care | Payer: PPO | Source: Ambulatory Visit | Attending: Radiation Oncology | Admitting: Radiation Oncology

## 2019-03-30 ENCOUNTER — Inpatient Hospital Stay: Payer: PPO

## 2019-03-30 ENCOUNTER — Inpatient Hospital Stay (HOSPITAL_BASED_OUTPATIENT_CLINIC_OR_DEPARTMENT_OTHER): Payer: PPO | Admitting: Oncology

## 2019-03-30 ENCOUNTER — Encounter: Payer: Self-pay | Admitting: Oncology

## 2019-03-30 ENCOUNTER — Other Ambulatory Visit: Payer: Self-pay

## 2019-03-30 VITALS — BP 113/64 | HR 92 | Temp 97.9°F | Resp 18 | Wt 236.0 lb

## 2019-03-30 DIAGNOSIS — R300 Dysuria: Secondary | ICD-10-CM

## 2019-03-30 DIAGNOSIS — C689 Malignant neoplasm of urinary organ, unspecified: Secondary | ICD-10-CM | POA: Diagnosis not present

## 2019-03-30 DIAGNOSIS — Z5111 Encounter for antineoplastic chemotherapy: Secondary | ICD-10-CM | POA: Diagnosis not present

## 2019-03-30 DIAGNOSIS — N3001 Acute cystitis with hematuria: Secondary | ICD-10-CM

## 2019-03-30 DIAGNOSIS — Z510 Encounter for antineoplastic radiation therapy: Secondary | ICD-10-CM | POA: Diagnosis not present

## 2019-03-30 LAB — URINALYSIS, COMPLETE (UACMP) WITH MICROSCOPIC
Bilirubin Urine: NEGATIVE
Glucose, UA: NEGATIVE mg/dL
Ketones, ur: NEGATIVE mg/dL
Nitrite: NEGATIVE
Protein, ur: 30 mg/dL — AB
Specific Gravity, Urine: 1.01 (ref 1.005–1.030)
Squamous Epithelial / HPF: NONE SEEN (ref 0–5)
WBC, UA: >50 WBC/hpf — ABNORMAL HIGH (ref 0–5)
pH: 6 (ref 5.0–8.0)

## 2019-03-30 MED ORDER — DOXYCYCLINE HYCLATE 100 MG PO TABS: 100.0000 mg | ORAL_TABLET | Freq: Two times a day (BID) | ORAL | 0 refills | Status: DC

## 2019-03-30 NOTE — Progress Notes (Signed)
Symptom Management Consult note [facility]  [phone] [phone]  Patient Care Team: [name], MD as PCP - General (Family Medicine) Dingeldein, Remo Lipps, MD as Consulting Physician (Ophthalmology) Hollice Espy, MD as Consulting Physician (Urology) Yolonda Kida, MD as Consulting Physician (Cardiology) Laneta Simmers as Physician Assistant (Urology)   Name of the patient: Bryan Nelson  [id number]  03-04-1932   Date of visit: 03/30/2019   Diagnosis-diffuse large B-cell lymphoma status post 6 cycles R-CHOP and urothelial carcinoma  Chief complaint/ Reason for visit-medication review and dysuria/frequency/hesitancy/nocturia.  Heme/Onc history:  Oncology History Overview Note   Diffuse Large B Cell Lymphoma: Patient was initially diagnosed in August 2015, pathology was noted to be anaplastic subtype.  He completed 6 cycles of R-CHOP chemotherapy in January 2016. PET scan January 2017 revealed no evidence of disease.  2.  Urothelial carcinoma: Confirmed by biopsy.  Patient's most recent CT scan on March 01, 2019 reviewed independently and reported as above with progressive left pelvic mass greater than 4 cm.  Patient appears to have no other evidence of disease.  Case discussed with urology.  Given  the fact that patient is symptomatic with pain and has localized disease, he will benefit from concurrent chemotherapy using weekly cisplatin 40 mg/m along with daily XRT.  Delay cycle 1 of cisplatin today secondary to elevated creatinine.  Continue daily XRT.  Return to clinic in 1 week for further evaluation and reconsideration of cycle 2.     DLBCL (diffuse large B cell lymphoma) ([place])  04/10/2014 Initial Diagnosis   DLBCL (diffuse large B cell lymphoma) ([facility])    Interval history-patient presents to [facility] today for complaints of dysuria and hesitancy that started approximately 1 week ago.  He has history of chronic recurrent  UTIs.  He was treated on 03/01/2019 with Macrobid and started on nitrofurantoin which was switched to Augmentin due to c&s revealing resistance.  He developed angioedema thought to be due to either Augmentin or a new ACE inhibitor and was seen in the emergency department and switched to doxycycline at discharge.  He tolerated doxycycline well.  He was admitted on 03/20/2019 in the hospital with a large left lower extremity DVT requiring stent placement.  He was started on Eliquis.  Today he complains of burning with urination, dysuria, hesitancy and nocturia He has had symptoms for 3 days.  He denies fever, nausea or vomiting. Patient denies back pain, cough, fever, headache and sorethroat. Patient does have a history of recurrent UTI.  Patient does have a history of pyelonephritis.   ECOG FS:1 - Symptomatic but completely ambulatory  Review of systems- Review of Systems  Constitutional: Positive for malaise/fatigue.  Genitourinary: Positive for dysuria and urgency.  Psychiatric/Behavioral: Positive for memory loss.     Current treatment- s/p cycle 1 cisplatin given on 03/20/2019.  Concurrent radiation to pelvis.   Allergies  Allergen Reactions  . Augmentin [Amoxicillin-Pot Clavulanate]     Tongue swelling 2 days after taking  . Ciprofloxacin Diarrhea  . Lisinopril Other (See Comments)    Unknown   . Penicillins Swelling    unknwon reaction.  tolerates amoxicillin Has patient had a PCN reaction causing immediate rash, facial/tongue/throat swelling, SOB or lightheadedness with hypotension: No Has patient had a PCN reaction causing severe rash involving mucus membranes or skin necrosis: No Has patient had a PCN reaction that required hospitalization: No Has patient had a PCN reaction occurring within the last 10 years: Yes  If all of the above answers are "NO", then may proceed with Cephalosporin use.   . Bactrim [Sulfamethoxazole-Trimethoprim] Rash  . Sulfa Antibiotics Itching and Rash      Past Medical History:  Diagnosis Date  . Arteriosclerosis of coronary artery 08/26/2011   Overview:      a.  1999 PCI of the mid LAD with stent.      b.  2002 Cath [place]: EF 56%. RCA-distal 25/25%. Left main-50% ostial.  Left circumflex-25% OM2.  LAD-25% proximal.  75% mid.  25/25% distal. 75% D1.      c.  07/2011 PCI of RCA with DES.[place]   . Bleeding gastrointestinal 08/26/2011   Overview:      a.  Chronic abdominal pain, present improving.      b.  Duodenitis and gastritis by EGD in 2000.      c.  Pylori in 2000, treated.      d.  Gastroesophageal reflux disease.      e.  Diverticular disease.    . BP (high blood pressure) 08/26/2011  . Closed dislocation of right ankle 09/10/2016   Successfully reduced in ER 09/10/2016  . Diffuse large B-cell lymphoma ([place]) 2015   chemo tx's  . GERD (gastroesophageal reflux disease)   . Heart disease   . Helicobacter pylori infection 06/18/2015   by EGD RX 08/18/1999   . History of adenomatous polyp of colon 06/18/2015  . HOH (hard of hearing)    Bilateral Hearing  Aids  . Malleolar fracture (Right) 09/10/2016     Past Surgical History:  Procedure Laterality Date  . ANGIOPLASTY    . CATARACT EXTRACTION    . COLONOSCOPY  2016  . CORONARY ANGIOPLASTY    . CYSTOSCOPY WITH STENT PLACEMENT Left 03/17/2019   Procedure: CYSTOSCOPY WITH STENT PLACEMENT;  Surgeon: Festus Aloe, MD;  Location: [facility] ORS;  Service: Urology;  Laterality: Left;  . EYE SURGERY Bilateral    Cataract Extraction with IOL  . GREEN LIGHT LASER TURP (TRANSURETHRAL RESECTION OF PROSTATE N/A 04/16/2015   Procedure: GREEN LIGHT LASER TURP (TRANSURETHRAL RESECTION OF PROSTATE WITH BLADDER BIOPSY;  Surgeon: Collier Flowers, MD;  Location: [facility] ORS;  Service: Urology;  Laterality: N/A;  . heart stent placement     1998, 2000, 2012  . PERIPHERAL VASCULAR THROMBECTOMY Left 03/22/2019   Procedure: PERIPHERAL VASCULAR THROMBECTOMY and possible IVC filter;  Surgeon: Algernon Huxley, MD;   Location: [place] [facility];  Service: Cardiovascular;  Laterality: Left;  . TRANSURETHRAL RESECTION OF PROSTATE N/A 03/02/2017   Procedure: TRANSURETHRAL RESECTION OF THE PROSTATE (TURP);  Surgeon: Hollice Espy, MD;  Location: [facility] ORS;  Service: Urology;  Laterality: N/A;    Social History   Socioeconomic History  . Marital status: Widowed    Spouse name: Not on file  . Number of children: 0  . Years of education: Not on file  . Highest education level: 12th grade  Occupational History  . Occupation: retired  Scientific laboratory technician  . Financial resource strain: Not very hard  . Food insecurity    Worry: Never true    Inability: Never true  . Transportation needs    Medical: No    Non-medical: No  Tobacco Use  . Smoking status: Former Smoker    Packs/day: 1.50    Years: 12.00    Pack years: 18.00    Types: Cigarettes    Quit date: 08/24/1971    Years since quitting: 47.6  . Smokeless tobacco: Never Used  Substance and Sexual Activity  . Alcohol use: No    Alcohol/week: 0.0 standard drinks  . Drug use: No  . Sexual activity: Not on file  Lifestyle  . Physical activity    Days per week: 0 days    Minutes per session: 0 min  . Stress: Not at all  Relationships  . Social Herbalist on phone: Patient refused    Gets together: Patient refused    Attends religious service: Patient refused    Active member of club or organization: Patient refused    Attends meetings of clubs or organizations: Patient refused    Relationship status: Patient refused  . Intimate partner violence    Fear of current or ex partner: Patient refused    Emotionally abused: Patient refused    Physically abused: Patient refused    Forced sexual activity: Patient refused  Other Topics Concern  . Not on file  Social History Narrative  . Not on file    Family History  Problem Relation Age of Onset  . Osteoporosis Mother   . Alzheimer's disease Father   . Kidney disease Neg Hx   .  Prostate cancer Neg Hx   . Bladder Cancer Neg Hx   . Kidney cancer Neg Hx      Current Outpatient Medications:  .  ALPRAZolam (XANAX) 0.5 MG tablet, Take 1 tablet (0.5 mg total) by mouth at bedtime as needed for anxiety., Disp: 30 tablet, Rfl: 1 .  apixaban (ELIQUIS) 5 MG TABS tablet, Take 1 tablet (5 mg total) by mouth 2 (two) times daily., Disp: 60 tablet, Rfl: 3 .  atorvastatin (LIPITOR) 10 MG tablet, Take 1 tablet (10 mg total) by mouth daily., Disp: 30 tablet, Rfl: 11 .  Cholecalciferol (VITAMIN D3) 1000 units CAPS, Take 1,000 Units by mouth daily., Disp: , Rfl:  .  famotidine (PEPCID) 20 MG tablet, Take 1 tablet (20 mg total) by mouth daily., Disp: 180 tablet, Rfl: 3 .  finasteride (PROSCAR) 5 MG tablet, Take 1 tablet (5 mg total) by mouth daily., Disp: 90 tablet, Rfl: 3 .  gabapentin (NEURONTIN) 300 MG capsule, Take 1 capsule (300 mg total) by mouth at bedtime. Do not take if you take temazepam, Disp: 30 capsule, Rfl: 6 .  hydrochlorothiazide (HYDRODIURIL) 25 MG tablet, Take 0.5 tablets (12.5 mg total) by mouth daily., Disp: , Rfl:  .  isosorbide dinitrate (ISORDIL) 30 MG tablet, Take 30 mg by mouth daily., Disp: , Rfl:  .  Magnesium 250 MG TABS, Take 250 mg by mouth daily., Disp: , Rfl:  .  metoprolol succinate (TOPROL-XL) 25 MG 24 hr tablet, Take 1 tablet (25 mg total) by mouth daily., Disp: 90 tablet, Rfl: 4 .  Multiple Vitamins-Minerals (VISION FORMULA 2 PO), Take by mouth daily., Disp: , Rfl:  .  naproxen sodium (ALEVE) 220 MG tablet, Take 220 mg by mouth daily as needed., Disp: , Rfl:  .  ondansetron (ZOFRAN) 8 MG tablet, Take 1 tablet (8 mg total) by mouth 2 (two) times daily as needed for nausea or vomiting., Disp: 20 tablet, Rfl: 3 .  prochlorperazine (COMPAZINE) 10 MG tablet, Take 1 tablet (10 mg total) by mouth every 6 (six) hours as needed for nausea or vomiting., Disp: 30 tablet, Rfl: 3 .  psyllium (METAMUCIL) 58.6 % packet, Take 1 packet by mouth daily., Disp: , Rfl:  .   zolpidem (AMBIEN) 10 MG tablet, Take 1 tablet (10 mg total) by mouth at bedtime as  needed for sleep., Disp: 30 tablet, Rfl: 0 .  doxycycline (VIBRA-TABS) 100 MG tablet, Take 1 tablet (100 mg total) by mouth 2 (two) times daily., Disp: 20 tablet, Rfl: 0  Physical exam:  Vitals:   03/30/19 1402  BP: 113/64  Pulse: 92  Resp: 18  Temp: 97.9 F (36.6 C)  TempSrc: Tympanic  Weight: 236 lb (107 kg)   Physical Exam Vitals signs and nursing note reviewed.  Cardiovascular:     Rate and Rhythm: Normal rate and regular rhythm.  Pulmonary:     Effort: Pulmonary effort is normal.     Breath sounds: Normal breath sounds.  Neurological:     Mental Status: He is alert.      CMP Latest Ref Rng & Units 03/27/2019  Glucose 70 - 99 mg/dL 103(H)  BUN 8 - 23 mg/dL 52(H)  Creatinine 0.61 - 1.24 mg/dL 2.30(H)  Sodium 135 - 145 mmol/L 139  Potassium 3.5 - 5.1 mmol/L 3.9  Chloride 98 - 111 mmol/L 101  CO2 22 - 32 mmol/L 28  Calcium 8.9 - 10.3 mg/dL 8.8(L)  Total Protein 6.5 - 8.1 g/dL -  Total Bilirubin 0.3 - 1.2 mg/dL -  Alkaline Phos 38 - 126 U/L -  AST 15 - 41 U/L -  ALT 0 - 44 U/L -   CBC Latest Ref Rng & Units 03/27/2019  WBC 4.0 - 10.5 K/uL 9.6  Hemoglobin 13.0 - 17.0 g/dL 10.1(L)  Hematocrit 39.0 - 52.0 % 31.1(L)  Platelets 150 - 400 K/uL 304    No images are attached to the encounter.  US Venous Img Lower Unilateral Left  Result Date: 03/22/2019 CLINICAL DATA:  Left lower extremity pain and edema since 6 a.m. this morning. Evaluate for DVT. EXAM: LEFT LOWER EXTREMITY VENOUS DOPPLER ULTRASOUND TECHNIQUE: Gray-scale sonography with graded compression, as well as color Doppler and duplex ultrasound were performed to evaluate the lower extremity deep venous systems from the level of the common femoral vein and including the common femoral, femoral, profunda femoral, popliteal and calf veins including the posterior tibial, peroneal and gastrocnemius veins when visible. The superficial great  saphenous vein was also interrogated. Spectral Doppler was utilized to evaluate flow at rest and with distal augmentation maneuvers in the common femoral, femoral and popliteal veins. COMPARISON:  None. FINDINGS: Contralateral Common Femoral Vein: Respiratory phasicity is normal and symmetric with the symptomatic side. No evidence of thrombus. Normal compressibility. There is hypoechoic occlusive thrombus within the left common femoral vein (image 7). There is a minimal amount of nonocclusive thrombus involving the proximal aspect of the greater saphenous vein extending to the saphenofemoral junction (image 10). There is hypoechoic occlusive SVT within the greater saphenous vein at the level the mid thigh (image 34). There is hypoechoic occlusive thrombus within the imaged portions the left deep femoral vein (image 13). There is hypoechoic occlusive thrombus involving the proximal (image 16), mid (image 19) and distal (image 22) aspects of the femoral vein, extending to involve the proximal (image 25) and distal (image 28) aspects of the popliteal vein extending to involve both paired posterior tibial veins (image 31 and both paired peroneal veins (image 33). Other Findings:  None. IMPRESSION: 1. Examination is positive for extensive occlusive DVT extending from the left common femoral vein through the left tibial veins. 2. Occlusive superficial thrombophlebitis involving the greater saphenous vein at the level of the mid thigh. Electronically Signed   By: Sandi Mariscal M.D.   On: 03/22/2019 08:32  Dg C-arm 1-60 Min-no Report  Result Date: 03/17/2019 Fluoroscopy was utilized by the requesting physician.  No radiographic interpretation.   Assessment and plan- Patient is a 85 y.o. male who presents for review of current medications and complete a urinalysis due to symptoms of UTI including dysuria.  He does have history of urothelial carcinoma.  Urothelial carcinoma: s/p 1 cycle cisplatin given on 03/20/2019.   He is also receiving radiation concurrently to pelvic mass.  Required cystoscopy and stent placement due to obstructive kidney pelvic mass evident by rising creatinine.  This was placed by Dr. Erlene Quan on 03/17/2019.  Creatinine has improved.  Radiation started on 03/19/2019.  Scheduled to return to clinic for cycle 2 cisplatin on 04/03/2019.  Left lower extremity DVT: Diagnosed on 03/22/2019 when he arrived to the hospital via EMS.  Had thrombolysis, thrombectomy and stent placement by Dr. dew on 03/22/2019 and he was discharged home on 03/24/2019 on eliquis.  Improving.  Dysuria/hesitancy/frequency: We will check UA/urine culture.  Patient has history of recurrent UTIs.  Patient recently was treated with doxycycline 100 mg tablets twice a day for 5 days.  This would have helped him completing his course on 03/21/2019.   Hematuria: Trace hematuria on urinalysis.  Has history of urothelial carcinoma and recently started on Eliquis.  We will continue to monitor his labs and he is instructed to let us know if any he developed visible blood in his urine.  Medication management: Patient would like to review his medications to ensure he is taking the appropriate amount of each.  Plan: Collect UA/urine culture.  Urinalysis positive for UTI. Review medications. Rx doxycycline 100 mg twice daily x7 days.Patient has allergies to Augmentin, sulfa, fluoroquinolones and penicillins.  Given he was recently treated with nitrofurantoin with ineffective coverage, will re-prescribe doxycycline.  We did discuss at length about the development of resistance to antibiotics and given he has several allergies, he will need to take this medication for the full 7 days.  Disposition: RTC daily for radiation. RTC 04/03/2023 last night, labs and consideration of cycle 2 cisplatin. Start doxycycline 100 mg twice daily x7 days today.     Visit Diagnosis 1. Acute cystitis with hematuria     Patient expressed understanding and was  in agreement with this plan. He also understands that He can call clinic at any time with any questions, concerns, or complaints.   Greater than 50% was spent in counseling and coordination of care with this patient including but not limited to discussion of the relevant topics above (See A&P) including, but not limited to diagnosis and management of acute and chronic medical conditions.   Thank you for allowing me to participate in the care of this very pleasant patient.    Jacquelin Hawking, NP [place] [facility] Cell - [phone] Pager- [phone] 04/02/2019 10:28 AM

## 2019-03-31 LAB — URINE CULTURE

## 2019-03-31 NOTE — Progress Notes (Signed)
[facility]  [phone]  [phone]     [facility] DOB: [birth year]  MR#: [id number]  TIR#:[id number]  Patient Care Team: [name], MD as PCP - General (Family Medicine) Dingeldein, Remo Lipps, MD as Consulting Physician (Ophthalmology) Hollice Espy, MD as Consulting Physician (Urology) Yolonda Kida, MD as Consulting Physician (Cardiology) Laneta Simmers as Physician Assistant (Urology)   CHIEF COMPLAINT: History of diffuse large B-cell lymphoma, now with urothelial carcinoma.  INTERVAL HISTORY: Patient returns to clinic today for further evaluation and reconsideration of cycle 2 of weekly cisplatin.  He continues to have flank/back pain similar to when he had a UTI, but otherwise feels well.. He has no neurologic complaints.  He denies any recent fevers or illnesses.  He has a fair appetite and denies weight loss.  He denies any chest pain, shortness of breath, cough, or hemoptysis.  He has no nausea, vomiting, constipation, or diarrhea.  He denies any melena or hematochezia.  He has no urinary complaints.  Patient offers no further specific complaints today.  REVIEW OF SYSTEMS:   Review of Systems  Constitutional: Negative.  Negative for diaphoresis, fever, malaise/fatigue and weight loss.  Respiratory: Negative.  Negative for cough and shortness of breath.   Cardiovascular: Negative.  Negative for chest pain and leg swelling.  Gastrointestinal: Negative.  Negative for abdominal pain, blood in stool and melena.  Genitourinary: Positive for flank pain. Negative for dysuria, frequency, hematuria and urgency.  Musculoskeletal: Positive for back pain. Negative for joint pain.  Skin: Negative.  Negative for rash.  Neurological: Negative.  Negative for dizziness, focal weakness, weakness and headaches.  Psychiatric/Behavioral: The patient has insomnia. The patient is not nervous/anxious.     As per HPI. Otherwise, a  complete review of systems is negative.  ONCOLOGY HISTORY: Oncology History Overview Note   Diffuse Large B Cell Lymphoma: Patient was initially diagnosed in August 2015, pathology was noted to be anaplastic subtype.  He completed 6 cycles of R-CHOP chemotherapy in January 2016. PET scan January 2017 revealed no evidence of disease.  2.  Urothelial carcinoma: Confirmed by biopsy.  Patient's most recent CT scan on March 01, 2019 reviewed independently and reported as above with progressive left pelvic mass greater than 4 cm.  Patient appears to have no other evidence of disease.  Case discussed with urology.  Given  the fact that patient is symptomatic with pain and has localized disease, he will benefit from concurrent chemotherapy using weekly cisplatin 40 mg/m along with daily XRT.  Delay cycle 1 of cisplatin today secondary to elevated creatinine.  Continue daily XRT.  Return to clinic in 1 week for further evaluation and reconsideration of cycle 2.     DLBCL (diffuse large B cell lymphoma) ([place])  04/10/2014 Initial Diagnosis   DLBCL (diffuse large B cell lymphoma) ([place])     PAST MEDICAL HISTORY: Past Medical History:  Diagnosis Date  . Arteriosclerosis of coronary artery 08/26/2011   Overview:      a.  1999 PCI of the mid LAD with stent.      b.  2002 Cath [place]: EF 56%. RCA-distal 25/25%. Left main-50% ostial.  Left circumflex-25% OM2.  LAD-25% proximal.  75% mid.  25/25% distal. 75% D1.      c.  07/2011 PCI of RCA with DES.[place]   . Bleeding gastrointestinal 08/26/2011   Overview:      a.  Chronic abdominal pain, present improving.  b.  Duodenitis and gastritis by EGD in 2000.      c.  Pylori in 2000, treated.      d.  Gastroesophageal reflux disease.      e.  Diverticular disease.    . BP (high blood pressure) 08/26/2011  . Closed dislocation of right ankle 09/10/2016   Successfully reduced in ER 09/10/2016  . Diffuse large B-cell lymphoma ([place]) 2015   chemo tx's  . GERD  (gastroesophageal reflux disease)   . Heart disease   . Helicobacter pylori infection 06/18/2015   by EGD RX 08/18/1999   . History of adenomatous polyp of colon 06/18/2015  . HOH (hard of hearing)    Bilateral Hearing  Aids  . Malleolar fracture (Right) 09/10/2016    PAST SURGICAL HISTORY: Past Surgical History:  Procedure Laterality Date  . ANGIOPLASTY    . CATARACT EXTRACTION    . COLONOSCOPY  2016  . CORONARY ANGIOPLASTY    . CYSTOSCOPY WITH STENT PLACEMENT Left 03/17/2019   Procedure: CYSTOSCOPY WITH STENT PLACEMENT;  Surgeon: Festus Aloe, MD;  Location: [facility] ORS;  Service: Urology;  Laterality: Left;  . EYE SURGERY Bilateral    Cataract Extraction with IOL  . GREEN LIGHT LASER TURP (TRANSURETHRAL RESECTION OF PROSTATE N/A 04/16/2015   Procedure: GREEN LIGHT LASER TURP (TRANSURETHRAL RESECTION OF PROSTATE WITH BLADDER BIOPSY;  Surgeon: Collier Flowers, MD;  Location: [facility] OR[facility];  Service: Urology;  Laterality: N/A;  . heart stent placement     1998, 2000, 2012  . PERIPHERAL VASCULAR THROMBECTOMY Left 03/22/2019   Procedure: PERIPHERAL VASCULAR THROMBECTOMY and possible IVC filter;  Surgeon: Algernon Huxley, MD;  Location: [facility];  Service: Cardiovascular;  Laterality: Left;  . TRANSURETHRAL RESECTION OF PROSTATE N/A 03/02/2017   Procedure: TRANSURETHRAL RESECTION OF THE PROSTATE (TURP);  Surgeon: Hollice Espy, MD;  Location: [facility];  Service: Urology;  Laterality: N/A;    FAMILY HISTORY Family History  Problem Relation Age of Onset  . Osteoporosis Mother   . Alzheimer's disease Father   . Kidney disease Neg Hx   . Prostate cancer Neg Hx   . Bladder Cancer Neg Hx   . Kidney cancer Neg Hx    AVANCED DIRECTIVES:    HEALTH MAINTENANCE: Social History   Tobacco Use  . Smoking status: Former Smoker    Packs/day: 1.50    Years: 12.00    Pack years: 18.00    Types: Cigarettes    Quit date: 08/24/1971    Years since quitting: 47.6  . Smokeless tobacco:  Never Used  Substance Use Topics  . Alcohol use: No    Alcohol/week: 0.0 standard drinks  . Drug use: No    Allergies  Allergen Reactions  . Augmentin [Amoxicillin-Pot Clavulanate]     Tongue swelling 2 days after taking  . Ciprofloxacin Diarrhea  . Lisinopril Other (See Comments)    Unknown   . Penicillins Swelling    unknwon reaction.  tolerates amoxicillin Has patient had a PCN reaction causing immediate rash, facial/tongue/throat swelling, SOB or lightheadedness with hypotension: No Has patient had a PCN reaction causing severe rash involving mucus membranes or skin necrosis: No Has patient had a PCN reaction that required hospitalization: No Has patient had a PCN reaction occurring within the last 10 years: Yes If all of the above answers are "NO", then may proceed with Cephalosporin use.   . Bactrim [Sulfamethoxazole-Trimethoprim] Rash  . Sulfa Antibiotics Itching and Rash    Current Outpatient Medications  Medication Sig Dispense Refill  . ALPRAZolam (XANAX) 0.5 MG tablet Take 1 tablet (0.5 mg total) by mouth at bedtime as needed for anxiety. 30 tablet 1  . apixaban (ELIQUIS) 5 MG TABS tablet Take 1 tablet (5 mg total) by mouth 2 (two) times daily. 60 tablet 3  . atorvastatin (LIPITOR) 10 MG tablet Take 1 tablet (10 mg total) by mouth daily. 30 tablet 11  . Cholecalciferol (VITAMIN D3) 1000 units CAPS Take 1,000 Units by mouth daily.    Marland Kitchen doxycycline (VIBRA-TABS) 100 MG tablet Take 1 tablet (100 mg total) by mouth 2 (two) times daily. 20 tablet 0  . famotidine (PEPCID) 20 MG tablet Take 1 tablet (20 mg total) by mouth daily. 180 tablet 3  . finasteride (PROSCAR) 5 MG tablet Take 1 tablet (5 mg total) by mouth daily. 90 tablet 3  . gabapentin (NEURONTIN) 300 MG capsule Take 1 capsule (300 mg total) by mouth at bedtime. Do not take if you take temazepam 30 capsule 6  . hydrochlorothiazide (HYDRODIURIL) 25 MG tablet Take 0.5 tablets (12.5 mg total) by mouth daily.    .  isosorbide dinitrate (ISORDIL) 30 MG tablet Take 30 mg by mouth daily.    . Magnesium 250 MG TABS Take 250 mg by mouth daily.    . metoprolol succinate (TOPROL-XL) 25 MG 24 hr tablet Take 1 tablet (25 mg total) by mouth daily. 90 tablet 4  . Multiple Vitamins-Minerals (VISION FORMULA 2 PO) Take by mouth daily.    . naproxen sodium (ALEVE) 220 MG tablet Take 220 mg by mouth daily as needed.    . psyllium (METAMUCIL) 58.6 % packet Take 1 packet by mouth daily.    Marland Kitchen zolpidem (AMBIEN) 10 MG tablet Take 1 tablet (10 mg total) by mouth at bedtime as needed for sleep. 30 tablet 0  . ondansetron (ZOFRAN) 8 MG tablet Take 1 tablet (8 mg total) by mouth 2 (two) times daily as needed for nausea or vomiting. (Patient not taking: Reported on 04/03/2019) 20 tablet 3  . prochlorperazine (COMPAZINE) 10 MG tablet Take 1 tablet (10 mg total) by mouth every 6 (six) hours as needed for nausea or vomiting. (Patient not taking: Reported on 04/03/2019) 30 tablet 3   No current facility-administered medications for this visit.     OBJECTIVE: BP 101/65 (BP Location: Left Arm, Patient Position: Sitting, Cuff Size: Normal)   Temp (!) 97.1 F (36.2 C)   Resp 18   Wt 235 lb 14.4 oz (107 kg)   BMI 31.12 kg/m    Body mass index is 31.12 kg/m.    ECOG FS:0 - Asymptomatic  General: Well-developed, well-nourished, no acute distress. Eyes: Pink conjunctiva, anicteric sclera. HEENT: Normocephalic, moist mucous membranes. Lungs: Clear to auscultation bilaterally. Heart: Regular rate and rhythm. No rubs, murmurs, or gallops. Abdomen: Soft, nontender, nondistended. No organomegaly noted, normoactive bowel sounds. Musculoskeletal: No edema, cyanosis, or clubbing. Neuro: Alert, answering all questions appropriately. Cranial nerves grossly intact. Skin: No rashes or petechiae noted. Psych: Normal affect.  LAB RESULTS:  Infusion on 04/03/2019  Component Date Value Ref Range Status  . Color, Urine 04/03/2019 YELLOW* YELLOW  Final  . APPearance 04/03/2019 CLOUDY* CLEAR Final  . Specific Gravity, Urine 04/03/2019 1.012  1.005 - 1.030 Final  . pH 04/03/2019 6.0  5.0 - 8.0 Final  . Glucose, UA 04/03/2019 NEGATIVE  NEGATIVE mg/dL Final  . Hgb urine dipstick 04/03/2019 SMALL* NEGATIVE Final  . Bilirubin Urine 04/03/2019 NEGATIVE  NEGATIVE Final  .  Ketones, ur 04/03/2019 NEGATIVE  NEGATIVE mg/dL Final  . Protein, ur 04/03/2019 30* NEGATIVE mg/dL Final  . Nitrite 04/03/2019 NEGATIVE  NEGATIVE Final  . Chalmers Guest 04/03/2019 LARGE* NEGATIVE Final  . RBC / HPF 04/03/2019 11-20  0 - 5 RBC/hpf Final  . WBC, UA 04/03/2019 >50* 0 - 5 WBC/hpf Final  . Bacteria, UA 04/03/2019 RARE* NONE SEEN Final  . Squamous Epithelial / LPF 04/03/2019 NONE SEEN  0 - 5 Final  . WBC Clumps 04/03/2019 PRESENT   Final  . Mucus 04/03/2019 PRESENT   Final   Performed at [facility], [address], [place], [place] [address]  Appointment on 04/03/2019  Component Date Value Ref Range Status  . Sodium 04/03/2019 137  135 - 145 mmol/L Final  . Potassium 04/03/2019 4.0  3.5 - 5.1 mmol/L Final  . Chloride 04/03/2019 99  98 - 111 mmol/L Final  . CO2 04/03/2019 26  22 - 32 mmol/L Final  . Glucose, Bld 04/03/2019 131* 70 - 99 mg/dL Final  . BUN 04/03/2019 35* 8 - 23 mg/dL Final  . Creatinine, Ser 04/03/2019 1.77* 0.61 - 1.24 mg/dL Final  . Calcium 04/03/2019 9.1  8.9 - 10.3 mg/dL Final  . GFR calc non Af Amer 04/03/2019 34* >60 mL/min Final  . GFR calc Af Amer 04/03/2019 40* >60 mL/min Final  . Anion gap 04/03/2019 12  5 - 15 Final   Performed at [facility], [name] [place], [place] [address]  . WBC 04/03/2019 8.2  4.0 - 10.5 K/uL Final  . RBC 04/03/2019 3.45* 4.22 - 5.81 MIL/uL Final  . Hemoglobin 04/03/2019 9.6* 13.0 - 17.0 g/dL Final  . HCT 04/03/2019 29.3* 39.0 - 52.0 % Final  . MCV 04/03/2019 84.9  80.0 - 100.0 fL Final  . MCH 04/03/2019 27.8  26.0 - 34.0 pg Final  . MCHC 04/03/2019 32.8  30.0 - 36.0 g/dL  Final  . RDW 04/03/2019 14.8  11.5 - 15.5 % Final  . Platelets 04/03/2019 320  150 - 400 K/uL Final  . nRBC 04/03/2019 0.0  0.0 - 0.2 % Final  . Neutrophils Relative % 04/03/2019 77  % Final  . Neutro Abs 04/03/2019 6.3  1.7 - 7.7 K/uL Final  . Lymphocytes Relative 04/03/2019 9  % Final  . Lymphs Abs 04/03/2019 0.7  0.7 - 4.0 K/uL Final  . Monocytes Relative 04/03/2019 9  % Final  . Monocytes Absolute 04/03/2019 0.7  0.1 - 1.0 K/uL Final  . Eosinophils Relative 04/03/2019 4  % Final  . Eosinophils Absolute 04/03/2019 0.4  0.0 - 0.5 K/uL Final  . Basophils Relative 04/03/2019 0  % Final  . Basophils Absolute 04/03/2019 0.0  0.0 - 0.1 K/uL Final  . Immature Granulocytes 04/03/2019 1  % Final  . Abs Immature Granulocytes 04/03/2019 0.05  0.00 - 0.07 K/uL Final   Performed at [facility], [address] [place], [place] [address]    STUDIES: No results found.   ASSESSMENT: History of diffuse large B-cell lymphoma.  PLAN:    1. Diffuse Large B Cell Lymphoma: Patient was initially diagnosed in August 2015, pathology was noted to be anaplastic subtype.  He completed 6 cycles of R-CHOP chemotherapy in January 2016. PET scan January 2017 revealed no evidence of disease.  2.  Urothelial carcinoma: Confirmed by biopsy.  Patient's most recent CT scan on March 01, 2019 reviewed independently and reported as above with progressive left pelvic mass greater than 4 cm.  Patient appears to have no other evidence of disease.  Case discussed with urology.  Given  the fact that patient is symptomatic with pain and has localized disease, he will benefit from concurrent chemotherapy using weekly cisplatin 40 mg/m along with daily XRT.  Proceed with cycle 2 of weekly cisplatin today.  Continue daily XRT.  Return to clinic in 1 week for further evaluation and consideration of cycle 3.  3.  Hydronephrosis: Patient had ureteral stent placed last week. 4.  Renal insufficiency: Patient's creatinine has  improved to 1.77.  Proceed with treatment as above. 5.  Pain: Improved.  Continue fentanyl patch and oxycodone. 6.  Anemia: Patient's hemoglobin continues to slowly trend down and is now 9.6.  Monitor. 7.  DVT: Appreciate vascular surgery input.  Patient will require Eliquis for minimum of 6 months until at least February 2021. 8.  UTI: Patient had positive UA and culture 4 days ago and was placed on doxycycline given multiple medication allergies.  Repeat culture from today is pending.  Continue doxycycline as prescribed.   Patient expressed understanding and was in agreement with this plan. He also understands that He can call clinic at any time with any questions, concerns, or complaints.    Lloyd Huger, MD   04/03/2019 2:14 PM

## 2019-04-02 ENCOUNTER — Other Ambulatory Visit: Payer: Self-pay

## 2019-04-02 ENCOUNTER — Ambulatory Visit
Admission: RE | Admit: 2019-04-02 | Discharge: 2019-04-02 | Disposition: A | Discharge status: Home / Self Care | Payer: PPO | Source: Ambulatory Visit | Attending: Radiation Oncology | Admitting: Radiation Oncology

## 2019-04-02 DIAGNOSIS — C689 Malignant neoplasm of urinary organ, unspecified: Secondary | ICD-10-CM | POA: Diagnosis not present

## 2019-04-02 DIAGNOSIS — Z510 Encounter for antineoplastic radiation therapy: Secondary | ICD-10-CM | POA: Diagnosis not present

## 2019-04-03 ENCOUNTER — Inpatient Hospital Stay: Payer: PPO

## 2019-04-03 ENCOUNTER — Ambulatory Visit
Admission: RE | Admit: 2019-04-03 | Discharge: 2019-04-03 | Disposition: A | Discharge status: Home / Self Care | Payer: PPO | Source: Ambulatory Visit | Attending: Radiation Oncology | Admitting: Radiation Oncology

## 2019-04-03 ENCOUNTER — Inpatient Hospital Stay (HOSPITAL_BASED_OUTPATIENT_CLINIC_OR_DEPARTMENT_OTHER): Payer: PPO | Admitting: Oncology

## 2019-04-03 ENCOUNTER — Other Ambulatory Visit: Payer: Self-pay

## 2019-04-03 ENCOUNTER — Encounter: Payer: Self-pay | Admitting: Oncology

## 2019-04-03 VITALS — BP 101/65 | Temp 97.1°F | Resp 18 | Wt 235.9 lb

## 2019-04-03 DIAGNOSIS — C689 Malignant neoplasm of urinary organ, unspecified: Secondary | ICD-10-CM

## 2019-04-03 DIAGNOSIS — R339 Retention of urine, unspecified: Secondary | ICD-10-CM

## 2019-04-03 DIAGNOSIS — Z510 Encounter for antineoplastic radiation therapy: Secondary | ICD-10-CM | POA: Diagnosis not present

## 2019-04-03 DIAGNOSIS — Z5111 Encounter for antineoplastic chemotherapy: Secondary | ICD-10-CM | POA: Diagnosis not present

## 2019-04-03 LAB — CBC WITH DIFFERENTIAL/PLATELET
Abs Immature Granulocytes: 0.05 10*3/uL (ref 0.00–0.07)
Basophils Absolute: 0 10*3/uL (ref 0.0–0.1)
Basophils Relative: 0 %
Eosinophils Absolute: 0.4 10*3/uL (ref 0.0–0.5)
Eosinophils Relative: 4 %
HCT: 29.3 % — ABNORMAL LOW (ref 39.0–52.0)
Hemoglobin: 9.6 g/dL — ABNORMAL LOW (ref 13.0–17.0)
Immature Granulocytes: 1 %
Lymphocytes Relative: 9 %
Lymphs Abs: 0.7 10*3/uL (ref 0.7–4.0)
MCH: 27.8 pg (ref 26.0–34.0)
MCHC: 32.8 g/dL (ref 30.0–36.0)
MCV: 84.9 fL (ref 80.0–100.0)
Monocytes Absolute: 0.7 10*3/uL (ref 0.1–1.0)
Monocytes Relative: 9 %
Neutro Abs: 6.3 10*3/uL (ref 1.7–7.7)
Neutrophils Relative %: 77 %
Platelets: 320 10*3/uL (ref 150–400)
RBC: 3.45 MIL/uL — ABNORMAL LOW (ref 4.22–5.81)
RDW: 14.8 % (ref 11.5–15.5)
WBC: 8.2 10*3/uL (ref 4.0–10.5)
nRBC: 0 % (ref 0.0–0.2)

## 2019-04-03 LAB — URINALYSIS, COMPLETE (UACMP) WITH MICROSCOPIC
Bilirubin Urine: NEGATIVE
Glucose, UA: NEGATIVE mg/dL
Ketones, ur: NEGATIVE mg/dL
Nitrite: NEGATIVE
Protein, ur: 30 mg/dL — AB
Specific Gravity, Urine: 1.012 (ref 1.005–1.030)
Squamous Epithelial / HPF: NONE SEEN (ref 0–5)
WBC, UA: >50 WBC/hpf — ABNORMAL HIGH (ref 0–5)
pH: 6 (ref 5.0–8.0)

## 2019-04-03 LAB — BASIC METABOLIC PANEL
Anion gap: 12 (ref 5–15)
BUN: 35 mg/dL — ABNORMAL HIGH (ref 8–23)
CO2: 26 mmol/L (ref 22–32)
Calcium: 9.1 mg/dL (ref 8.9–10.3)
Chloride: 99 mmol/L (ref 98–111)
Creatinine, Ser: 1.77 mg/dL — ABNORMAL HIGH (ref 0.61–1.24)
GFR calc Af Amer: 40 mL/min — ABNORMAL LOW (ref >60)
GFR calc non Af Amer: 34 mL/min — ABNORMAL LOW (ref >60)
Glucose, Bld: 131 mg/dL — ABNORMAL HIGH (ref 70–99)
Potassium: 4 mmol/L (ref 3.5–5.1)
Sodium: 137 mmol/L (ref 135–145)

## 2019-04-03 MED ORDER — POTASSIUM CHLORIDE 2 MEQ/ML IV SOLN
Freq: Once | INTRAVENOUS | Status: AC
  Administered 2019-04-03: 10:00:00 via INTRAVENOUS
  Filled 2019-04-03: qty 1000

## 2019-04-03 MED ORDER — SODIUM CHLORIDE 0.9 % IV SOLN
Freq: Once | INTRAVENOUS | Status: AC
  Administered 2019-04-03: 10:00:00 via INTRAVENOUS
  Filled 2019-04-03: qty 250

## 2019-04-03 MED ORDER — SODIUM CHLORIDE 0.9 % IV SOLN
Freq: Once | INTRAVENOUS | Status: AC
  Administered 2019-04-03: 13:00:00 via INTRAVENOUS
  Filled 2019-04-03: qty 5

## 2019-04-03 MED ORDER — PALONOSETRON HCL INJECTION 0.25 MG/5ML
0.2500 mg | Freq: Once | INTRAVENOUS | Status: AC
  Administered 2019-04-03: 0.25 mg via INTRAVENOUS
  Filled 2019-04-03: qty 5

## 2019-04-03 MED ORDER — DOXYCYCLINE HYCLATE 100 MG PO TABS: 100.0000 mg | ORAL_TABLET | Freq: Two times a day (BID) | ORAL | 0 refills | Status: DC

## 2019-04-03 MED ORDER — SODIUM CHLORIDE 0.9 % IV SOLN
35.0000 mg/m2 | Freq: Once | INTRAVENOUS | Status: AC
  Administered 2019-04-03: 85 mg via INTRAVENOUS
  Filled 2019-04-03: qty 85

## 2019-04-03 NOTE — Progress Notes (Signed)
Patient is here today for follow up, had some questions about medications

## 2019-04-04 ENCOUNTER — Other Ambulatory Visit: Payer: Self-pay

## 2019-04-04 ENCOUNTER — Ambulatory Visit
Admission: RE | Admit: 2019-04-04 | Discharge: 2019-04-04 | Disposition: A | Discharge status: Home / Self Care | Payer: PPO | Source: Ambulatory Visit | Attending: Radiation Oncology | Admitting: Radiation Oncology

## 2019-04-04 ENCOUNTER — Other Ambulatory Visit: Payer: PPO | Admitting: Student

## 2019-04-04 DIAGNOSIS — Z510 Encounter for antineoplastic radiation therapy: Secondary | ICD-10-CM | POA: Diagnosis not present

## 2019-04-04 DIAGNOSIS — Z515 Encounter for palliative care: Secondary | ICD-10-CM | POA: Diagnosis not present

## 2019-04-04 DIAGNOSIS — C689 Malignant neoplasm of urinary organ, unspecified: Secondary | ICD-10-CM | POA: Diagnosis not present

## 2019-04-04 NOTE — Progress Notes (Signed)
[facility] Consult Note Telephone: [phone]  Fax: [phone]  PATIENT NAME: [facility] DOB: [birth year] MRN: [id number]  PRIMARY CARE PROVIDER:   [name], MD  REFERRING PROVIDER:  Altha Harm, NP  RESPONSIBLE PARTY: Unk Pinto  ASSESSMENT: Due to the COVID-19 crisis, this visit was done via telemedicine and it was initiated and consent by this patient and or family. Palliative NP explained role of Palliative Medicine in the community. We discussed goals of care, symptom management and advanced care planning. MOST form filled out during visit.   Palliative NP spoke with Wilburn Mylar on phone after zoom call; he expresses that patient was not as forthcoming. He feels that patient is more confused about his medications, needs more assistance in the home. We discussed options such as hired caregivers. He states he and [name] will talk to patient about this.         RECOMMENDATIONS and PLAN:  1. Code status: Full Code, MOST form filled out today.  2. Medical goals of therapy: [name] will continue to receive chemotherapy/radiation. Home health SN requested for medication management.  3. Symptom management: UTI-continue doxycycline 100mg  BID until completed; awaiting culture. Pain- he is currently taking aleve for pain and feels this is managing pain; he is encouraged to let us know if pain worsens.   Palliative Medicine will follow up in 2 weeks or sooner, if needed.    I spent 60 minutes providing this consultation,  from 9:00am to 10:00am. More than 50% of the time in this consultation was spent coordinating communication.   HISTORY OF PRESENT ILLNESS:  B[facility] is a 85 y.o.  male with multiple medical problems including diffuse large B cell lymphoma, urothelial carcinoma, currently receiving concurrent chemotherapy- weekly cisplatin and daily XRT; hydronephrosis s/p stent placement, renal insufficiency, CAD,  GERD, hypertension, DVT. Palliative Care was asked to help address goals of care. [name] reports feeling okay today; he states he did not feel well yesterday after his treatment. He currently lives home alone, still drives. He states he is able to complete his adl's. Niece [name] reports patient having difficulty with medication management; she is filling out pill box for him. Mr. Samaras does report flank pain; U/A, C & S was collected yesterday. He has orders on 04/03/2019 for doxycycline 100mg  BID. He reports burning with urination; denies fever, pain with urination, dysuria, hematuria. He states he has been taking aleve prn pain; he denies pain otherwise. He reports a fair appetite. He states he slept well last night after taking prn xanax. He also has ambien prn ordered but states he has not taken this. He has treatments scheduled through April 25, 2019.   CODE STATUS: Full Code  PPS: 70% HOSPICE ELIGIBILITY/DIAGNOSIS: TBD  PAST MEDICAL HISTORY:  Past Medical History:  Diagnosis Date  . Arteriosclerosis of coronary artery 08/26/2011   Overview:      a.  1999 PCI of the mid LAD with stent.      b.  2002 Cath [facility]: EF 56%. RCA-distal 25/25%. Left main-50% ostial.  Left circumflex-25% OM2.  LAD-25% proximal.  75% mid.  25/25% distal. 75% D1.      c.  07/2011 PCI of RCA with DES.[place]   . Bleeding gastrointestinal 08/26/2011   Overview:      a.  Chronic abdominal pain, present improving.      b.  Duodenitis and gastritis by EGD in 2000.  c.  Pylori in 2000, treated.      d.  Gastroesophageal reflux disease.      e.  Diverticular disease.    . BP (high blood pressure) 08/26/2011  . Closed dislocation of right ankle 09/10/2016   Successfully reduced in ER 09/10/2016  . Diffuse large B-cell lymphoma ([place]) 2015   chemo tx's  . GERD (gastroesophageal reflux disease)   . Heart disease   . Helicobacter pylori infection 06/18/2015   by EGD RX 08/18/1999   . History of adenomatous polyp of colon  06/18/2015  . HOH (hard of hearing)    Bilateral Hearing  Aids  . Malleolar fracture (Right) 09/10/2016    SOCIAL HX:  Social History   Tobacco Use  . Smoking status: Former Smoker    Packs/day: 1.50    Years: 12.00    Pack years: 18.00    Types: Cigarettes    Quit date: 08/24/1971    Years since quitting: 47.6  . Smokeless tobacco: Never Used  Substance Use Topics  . Alcohol use: No    Alcohol/week: 0.0 standard drinks    ALLERGIES:  Allergies  Allergen Reactions  . Augmentin [Amoxicillin-Pot Clavulanate]     Tongue swelling 2 days after taking  . Ciprofloxacin Diarrhea  . Lisinopril Other (See Comments)    Unknown   . Penicillins Swelling    unknwon reaction.  tolerates amoxicillin Has patient had a PCN reaction causing immediate rash, facial/tongue/throat swelling, SOB or lightheadedness with hypotension: No Has patient had a PCN reaction causing severe rash involving mucus membranes or skin necrosis: No Has patient had a PCN reaction that required hospitalization: No Has patient had a PCN reaction occurring within the last 10 years: Yes If all of the above answers are "NO", then may proceed with Cephalosporin use.   . Bactrim [Sulfamethoxazole-Trimethoprim] Rash  . Sulfa Antibiotics Itching and Rash     PERTINENT MEDICATIONS:  Outpatient Encounter Medications as of 04/04/2019  Medication Sig  . ALPRAZolam (XANAX) 0.5 MG tablet Take 1 tablet (0.5 mg total) by mouth at bedtime as needed for anxiety.  Marland Kitchen apixaban (ELIQUIS) 5 MG TABS tablet Take 1 tablet (5 mg total) by mouth 2 (two) times daily.  Marland Kitchen atorvastatin (LIPITOR) 10 MG tablet Take 1 tablet (10 mg total) by mouth daily.  . Cholecalciferol (VITAMIN D3) 1000 units CAPS Take 1,000 Units by mouth daily.  Marland Kitchen doxycycline (VIBRA-TABS) 100 MG tablet Take 1 tablet (100 mg total) by mouth 2 (two) times daily.  . famotidine (PEPCID) 20 MG tablet Take 1 tablet (20 mg total) by mouth daily.  . finasteride (PROSCAR) 5 MG tablet  Take 1 tablet (5 mg total) by mouth daily.  Marland Kitchen gabapentin (NEURONTIN) 300 MG capsule Take 1 capsule (300 mg total) by mouth at bedtime. Do not take if you take temazepam  . hydrochlorothiazide (HYDRODIURIL) 25 MG tablet Take 0.5 tablets (12.5 mg total) by mouth daily.  . isosorbide dinitrate (ISORDIL) 30 MG tablet Take 30 mg by mouth daily.  . Magnesium 250 MG TABS Take 250 mg by mouth daily.  . metoprolol succinate (TOPROL-XL) 25 MG 24 hr tablet Take 1 tablet (25 mg total) by mouth daily.  . Multiple Vitamins-Minerals (VISION FORMULA 2 PO) Take by mouth daily.  . naproxen sodium (ALEVE) 220 MG tablet Take 220 mg by mouth daily as needed.  . ondansetron (ZOFRAN) 8 MG tablet Take 1 tablet (8 mg total) by mouth 2 (two) times daily as needed for nausea or vomiting.  Marland Kitchen  prochlorperazine (COMPAZINE) 10 MG tablet Take 1 tablet (10 mg total) by mouth every 6 (six) hours as needed for nausea or vomiting.  . psyllium (METAMUCIL) 58.6 % packet Take 1 packet by mouth daily.  Marland Kitchen zolpidem (AMBIEN) 10 MG tablet Take 1 tablet (10 mg total) by mouth at bedtime as needed for sleep.  . [DISCONTINUED] benazepril-hydrochlorthiazide (LOTENSIN HCT) 5-6.25 MG tablet Take 1 tablet by mouth once daily   No facility-administered encounter medications on file as of 04/04/2019.     PHYSICAL EXAM:   Physical exam deferred.  Ezekiel Slocumb, NP

## 2019-04-05 ENCOUNTER — Other Ambulatory Visit: Payer: Self-pay

## 2019-04-05 ENCOUNTER — Ambulatory Visit
Admission: RE | Admit: 2019-04-05 | Discharge: 2019-04-05 | Disposition: A | Discharge status: Home / Self Care | Payer: PPO | Source: Ambulatory Visit | Attending: Radiation Oncology | Admitting: Radiation Oncology

## 2019-04-05 DIAGNOSIS — E785 Hyperlipidemia, unspecified: Secondary | ICD-10-CM | POA: Diagnosis not present

## 2019-04-05 DIAGNOSIS — I2510 Atherosclerotic heart disease of native coronary artery without angina pectoris: Secondary | ICD-10-CM | POA: Diagnosis not present

## 2019-04-05 DIAGNOSIS — N136 Pyonephrosis: Secondary | ICD-10-CM | POA: Diagnosis not present

## 2019-04-05 DIAGNOSIS — K219 Gastro-esophageal reflux disease without esophagitis: Secondary | ICD-10-CM | POA: Diagnosis not present

## 2019-04-05 DIAGNOSIS — B9620 Unspecified Escherichia coli [E. coli] as the cause of diseases classified elsewhere: Secondary | ICD-10-CM | POA: Diagnosis not present

## 2019-04-05 DIAGNOSIS — N401 Enlarged prostate with lower urinary tract symptoms: Secondary | ICD-10-CM | POA: Diagnosis not present

## 2019-04-05 DIAGNOSIS — I10 Essential (primary) hypertension: Secondary | ICD-10-CM | POA: Diagnosis not present

## 2019-04-05 DIAGNOSIS — Z9181 History of falling: Secondary | ICD-10-CM | POA: Diagnosis not present

## 2019-04-05 DIAGNOSIS — Z792 Long term (current) use of antibiotics: Secondary | ICD-10-CM | POA: Diagnosis not present

## 2019-04-05 DIAGNOSIS — R338 Other retention of urine: Secondary | ICD-10-CM | POA: Diagnosis not present

## 2019-04-05 DIAGNOSIS — Z87891 Personal history of nicotine dependence: Secondary | ICD-10-CM | POA: Diagnosis not present

## 2019-04-05 DIAGNOSIS — H9193 Unspecified hearing loss, bilateral: Secondary | ICD-10-CM | POA: Diagnosis not present

## 2019-04-05 DIAGNOSIS — C689 Malignant neoplasm of urinary organ, unspecified: Secondary | ICD-10-CM | POA: Diagnosis not present

## 2019-04-05 DIAGNOSIS — R3914 Feeling of incomplete bladder emptying: Secondary | ICD-10-CM | POA: Diagnosis not present

## 2019-04-05 DIAGNOSIS — C8330 Diffuse large B-cell lymphoma, unspecified site: Secondary | ICD-10-CM | POA: Diagnosis not present

## 2019-04-05 DIAGNOSIS — G4700 Insomnia, unspecified: Secondary | ICD-10-CM | POA: Diagnosis not present

## 2019-04-05 DIAGNOSIS — C679 Malignant neoplasm of bladder, unspecified: Secondary | ICD-10-CM | POA: Diagnosis not present

## 2019-04-05 DIAGNOSIS — Z48816 Encounter for surgical aftercare following surgery on the genitourinary system: Secondary | ICD-10-CM | POA: Diagnosis not present

## 2019-04-05 DIAGNOSIS — T783XXD Angioneurotic edema, subsequent encounter: Secondary | ICD-10-CM | POA: Diagnosis not present

## 2019-04-05 DIAGNOSIS — E559 Vitamin D deficiency, unspecified: Secondary | ICD-10-CM | POA: Diagnosis not present

## 2019-04-05 DIAGNOSIS — C7989 Secondary malignant neoplasm of other specified sites: Secondary | ICD-10-CM | POA: Diagnosis not present

## 2019-04-05 DIAGNOSIS — Z48812 Encounter for surgical aftercare following surgery on the circulatory system: Secondary | ICD-10-CM | POA: Diagnosis not present

## 2019-04-05 DIAGNOSIS — Z510 Encounter for antineoplastic radiation therapy: Secondary | ICD-10-CM | POA: Diagnosis not present

## 2019-04-05 NOTE — Progress Notes (Signed)
[facility]  [phone]  [phone]     [name] DOB: Dec 28, 1931  MR#: [id number]  TTS#:[id number]  Patient Care Team: [name], MD as PCP - General (Family Medicine) Dingeldein, Remo Lipps, MD as Consulting Physician (Ophthalmology) Hollice Espy, MD as Consulting Physician (Urology) Yolonda Kida, MD as Consulting Physician (Cardiology) Laneta Simmers as Physician Assistant (Urology)   CHIEF COMPLAINT: History of diffuse large B-cell lymphoma, now with urothelial carcinoma.  INTERVAL HISTORY: Patient returns to clinic today for further evaluation and consideration of cycle 3 of weekly cisplatin.  He continues to have urinary frequency.  He also complains of worsening constipation.  His back/flank pain is still evident, but slightly improved.  He has new onset tremors, but no other neurologic complaints.  He denies any recent fevers or illnesses.  He has a fair appetite and denies weight loss.  He denies any chest pain, shortness of breath, cough, or hemoptysis.  He has no nausea, vomiting, or diarrhea.  He denies any melena or hematochezia.  Patient offers no further specific complaints today.  REVIEW OF SYSTEMS:   Review of Systems  Constitutional: Negative.  Negative for diaphoresis, fever, malaise/fatigue and weight loss.  Respiratory: Negative.  Negative for cough and shortness of breath.   Cardiovascular: Negative.  Negative for chest pain and leg swelling.  Gastrointestinal: Negative.  Negative for abdominal pain, blood in stool and melena.  Genitourinary: Positive for flank pain and frequency. Negative for dysuria, hematuria and urgency.  Musculoskeletal: Positive for back pain. Negative for joint pain.  Skin: Negative.  Negative for rash.  Neurological: Positive for tremors. Negative for dizziness, focal weakness, weakness and headaches.  Psychiatric/Behavioral: The patient has insomnia. The patient is not  nervous/anxious.     As per HPI. Otherwise, a complete review of systems is negative.  ONCOLOGY HISTORY: Oncology History Overview Note   Diffuse Large B Cell Lymphoma: Patient was initially diagnosed in August 2015, pathology was noted to be anaplastic subtype.  He completed 6 cycles of R-CHOP chemotherapy in January 2016. PET scan January 2017 revealed no evidence of disease.  2.  Urothelial carcinoma: Confirmed by biopsy.  Patient's most recent CT scan on March 01, 2019 reviewed independently and reported as above with progressive left pelvic mass greater than 4 cm.  Patient appears to have no other evidence of disease.  Case discussed with urology.  Given  the fact that patient is symptomatic with pain and has localized disease, he will benefit from concurrent chemotherapy using weekly cisplatin 40 mg/m along with daily XRT.  Delay cycle 1 of cisplatin today secondary to elevated creatinine.  Continue daily XRT.  Return to clinic in 1 week for further evaluation and reconsideration of cycle 2.     DLBCL (diffuse large B cell lymphoma) ([place])  04/10/2014 Initial Diagnosis   DLBCL (diffuse large B cell lymphoma) ([place])     PAST MEDICAL HISTORY: Past Medical History:  Diagnosis Date  . Arteriosclerosis of coronary artery 08/26/2011   Overview:      a.  1999 PCI of the mid LAD with stent.      b.  2002 [facility]: EF 56%. RCA-distal 25/25%. Left main-50% ostial.  Left circumflex-25% OM2.  LAD-25% proximal.  75% mid.  25/25% distal. 75% D1.      c.  07/2011 PCI of RCA with DES.[place]   . Bleeding gastrointestinal 08/26/2011   Overview:      a.  Chronic abdominal pain,  present improving.      b.  Duodenitis and gastritis by EGD in 2000.      c.  Pylori in 2000, treated.      d.  Gastroesophageal reflux disease.      e.  Diverticular disease.    . BP (high blood pressure) 08/26/2011  . Closed dislocation of right ankle 09/10/2016   Successfully reduced in ER 09/10/2016  . Diffuse large B-cell  lymphoma ([place]) 2015   chemo tx's  . GERD (gastroesophageal reflux disease)   . Heart disease   . Helicobacter pylori infection 06/18/2015   by EGD RX 08/18/1999   . History of adenomatous polyp of colon 06/18/2015  . HOH (hard of hearing)    Bilateral Hearing  Aids  . Malleolar fracture (Right) 09/10/2016    PAST SURGICAL HISTORY: Past Surgical History:  Procedure Laterality Date  . ANGIOPLASTY    . CATARACT EXTRACTION    . COLONOSCOPY  2016  . CORONARY ANGIOPLASTY    . CYSTOSCOPY WITH STENT PLACEMENT Left 03/17/2019   Procedure: CYSTOSCOPY WITH STENT PLACEMENT;  Surgeon: Festus Aloe, MD;  Location: [facility] ORS;  Service: Urology;  Laterality: Left;  . EYE SURGERY Bilateral    Cataract Extraction with IOL  . GREEN LIGHT LASER TURP (TRANSURETHRAL RESECTION OF PROSTATE N/A 04/16/2015   Procedure: GREEN LIGHT LASER TURP (TRANSURETHRAL RESECTION OF PROSTATE WITH BLADDER BIOPSY;  Surgeon: Collier Flowers, MD;  Location: [facility] OR[facility];  Service: Urology;  Laterality: N/A;  . heart stent placement     1998, 2000, 2012  . PERIPHERAL VASCULAR THROMBECTOMY Left 03/22/2019   Procedure: PERIPHERAL VASCULAR THROMBECTOMY and possible IVC filter;  Surgeon: Algernon Huxley, MD;  Location: [facility];  Service: Cardiovascular;  Laterality: Left;  . TRANSURETHRAL RESECTION OF PROSTATE N/A 03/02/2017   Procedure: TRANSURETHRAL RESECTION OF THE PROSTATE (TURP);  Surgeon: Hollice Espy, MD;  Location: [facility];  Service: Urology;  Laterality: N/A;    FAMILY HISTORY Family History  Problem Relation Age of Onset  . Osteoporosis Mother   . Alzheimer's disease Father   . Kidney disease Neg Hx   . Prostate cancer Neg Hx   . Bladder Cancer Neg Hx   . Kidney cancer Neg Hx    AVANCED DIRECTIVES:    HEALTH MAINTENANCE: Social History   Tobacco Use  . Smoking status: Former Smoker    Packs/day: 1.50    Years: 12.00    Pack years: 18.00    Types: Cigarettes    Quit date: 08/24/1971    Years  since quitting: 47.6  . Smokeless tobacco: Never Used  Substance Use Topics  . Alcohol use: No    Alcohol/week: 0.0 standard drinks  . Drug use: No    Allergies  Allergen Reactions  . Augmentin [Amoxicillin-Pot Clavulanate]     Tongue swelling 2 days after taking  . Ciprofloxacin Diarrhea  . Lisinopril Other (See Comments)    Unknown   . Penicillins Swelling    unknwon reaction.  tolerates amoxicillin Has patient had a PCN reaction causing immediate rash, facial/tongue/throat swelling, SOB or lightheadedness with hypotension: No Has patient had a PCN reaction causing severe rash involving mucus membranes or skin necrosis: No Has patient had a PCN reaction that required hospitalization: No Has patient had a PCN reaction occurring within the last 10 years: Yes If all of the above answers are "NO", then may proceed with Cephalosporin use.   . Bactrim [Sulfamethoxazole-Trimethoprim] Rash  . Sulfa Antibiotics Itching and  Rash    Current Outpatient Medications  Medication Sig Dispense Refill  . ALPRAZolam (XANAX) 0.5 MG tablet Take 1 tablet (0.5 mg total) by mouth at bedtime as needed for anxiety. 30 tablet 1  . apixaban (ELIQUIS) 5 MG TABS tablet Take 1 tablet (5 mg total) by mouth 2 (two) times daily. 60 tablet 3  . atorvastatin (LIPITOR) 10 MG tablet Take 1 tablet (10 mg total) by mouth daily. 30 tablet 11  . Cholecalciferol (VITAMIN D3) 1000 units CAPS Take 1,000 Units by mouth daily.    Marland Kitchen doxycycline (VIBRA-TABS) 100 MG tablet Take 1 tablet (100 mg total) by mouth 2 (two) times daily. 20 tablet 0  . famotidine (PEPCID) 20 MG tablet Take 1 tablet (20 mg total) by mouth daily. 180 tablet 3  . finasteride (PROSCAR) 5 MG tablet Take 1 tablet (5 mg total) by mouth daily. 90 tablet 3  . gabapentin (NEURONTIN) 300 MG capsule Take 1 capsule (300 mg total) by mouth at bedtime. Do not take if you take temazepam 30 capsule 6  . hydrochlorothiazide (HYDRODIURIL) 25 MG tablet Take 0.5 tablets  (12.5 mg total) by mouth daily.    . isosorbide dinitrate (ISORDIL) 30 MG tablet Take 30 mg by mouth daily.    . Magnesium 250 MG TABS Take 250 mg by mouth daily.    . metoprolol succinate (TOPROL-XL) 25 MG 24 hr tablet Take 1 tablet (25 mg total) by mouth daily. 90 tablet 4  . mirabegron ER (MYRBETRIQ) 25 MG TB24 tablet Take 1 tablet (25 mg total) by mouth daily. 30 tablet 5  . Multiple Vitamins-Minerals (VISION FORMULA 2 PO) Take by mouth daily.    . naproxen sodium (ALEVE) 220 MG tablet Take 220 mg by mouth daily as needed.    . ondansetron (ZOFRAN) 8 MG tablet Take 1 tablet (8 mg total) by mouth 2 (two) times daily as needed for nausea or vomiting. 20 tablet 3  . polyethylene glycol powder (CLEARLAX) 17 GM/SCOOP powder Take 1 Container by mouth daily.    . prochlorperazine (COMPAZINE) 10 MG tablet Take 1 tablet (10 mg total) by mouth every 6 (six) hours as needed for nausea or vomiting. 30 tablet 3  . psyllium (METAMUCIL) 58.6 % packet Take 1 packet by mouth daily.    . traMADol (ULTRAM) 50 MG tablet Take by mouth every 6 (six) hours as needed. Patient is Unsure of dose.    . zolpidem (AMBIEN) 10 MG tablet Take 1 tablet (10 mg total) by mouth at bedtime as needed for sleep. 30 tablet 0   No current facility-administered medications for this visit.    Facility-Administered Medications Ordered in Other Visits  Medication Dose Route Frequency Provider Last Rate Last Dose  . 0.9 %  sodium chloride infusion   Intravenous Continuous Lloyd Huger, MD   Stopped at 04/10/19 1057    OBJECTIVE: BP 101/63   Pulse 82   Temp (!) 97.5 F (36.4 C)   Resp 18   Wt 238 lb (108 kg)   BMI 31.40 kg/m    Body mass index is 31.4 kg/m.    ECOG FS:0 - Asymptomatic  General: Well-developed, well-nourished, no acute distress. Eyes: Pink conjunctiva, anicteric sclera. HEENT: Normocephalic, moist mucous membranes. Lungs: Clear to auscultation bilaterally. Heart: Regular rate and rhythm. No rubs,  murmurs, or gallops. Abdomen: Soft, nontender, nondistended. No organomegaly noted, normoactive bowel sounds. Musculoskeletal: No edema, cyanosis, or clubbing. Neuro: Alert, answering all questions appropriately. Cranial nerves grossly intact. Skin:  No rashes or petechiae noted. Psych: Normal affect.  LAB RESULTS:  Orders Only on 04/10/2019  Component Date Value Ref Range Status  . Sodium 04/10/2019 135  135 - 145 mmol/L Final  . Potassium 04/10/2019 4.1  3.5 - 5.1 mmol/L Final  . Chloride 04/10/2019 97* 98 - 111 mmol/L Final  . CO2 04/10/2019 28  22 - 32 mmol/L Final  . Glucose, Bld 04/10/2019 124* 70 - 99 mg/dL Final  . BUN 04/10/2019 29* 8 - 23 mg/dL Final  . Creatinine, Ser 04/10/2019 2.00* 0.61 - 1.24 mg/dL Final  . Calcium 04/10/2019 8.9  8.9 - 10.3 mg/dL Final  . GFR calc non Af Amer 04/10/2019 30* >60 mL/min Final  . GFR calc Af Amer 04/10/2019 34* >60 mL/min Final  . Anion gap 04/10/2019 10  5 - 15 Final   Performed at [facility], [address], [place], [place] [address]  . WBC 04/10/2019 6.4  4.0 - 10.5 K/uL Final  . RBC 04/10/2019 3.45* 4.22 - 5.81 MIL/uL Final  . Hemoglobin 04/10/2019 9.5* 13.0 - 17.0 g/dL Final  . HCT 04/10/2019 29.5* 39.0 - 52.0 % Final  . MCV 04/10/2019 85.5  80.0 - 100.0 fL Final  . MCH 04/10/2019 27.5  26.0 - 34.0 pg Final  . MCHC 04/10/2019 32.2  30.0 - 36.0 g/dL Final  . RDW 04/10/2019 14.6  11.5 - 15.5 % Final  . Platelets 04/10/2019 281  150 - 400 K/uL Final  . nRBC 04/10/2019 0.0  0.0 - 0.2 % Final  . Neutrophils Relative % 04/10/2019 71  % Final  . Neutro Abs 04/10/2019 4.6  1.7 - 7.7 K/uL Final  . Lymphocytes Relative 04/10/2019 9  % Final  . Lymphs Abs 04/10/2019 0.6* 0.7 - 4.0 K/uL Final  . Monocytes Relative 04/10/2019 12  % Final  . Monocytes Absolute 04/10/2019 0.8  0.1 - 1.0 K/uL Final  . Eosinophils Relative 04/10/2019 6  % Final  . Eosinophils Absolute 04/10/2019 0.4  0.0 - 0.5 K/uL Final  . Basophils Relative  04/10/2019 1  % Final  . Basophils Absolute 04/10/2019 0.0  0.0 - 0.1 K/uL Final  . Immature Granulocytes 04/10/2019 1  % Final  . Abs Immature Granulocytes 04/10/2019 0.08* 0.00 - 0.07 K/uL Final   Performed at [facility], [address] [place], [place] [address]    STUDIES: No results found.   ASSESSMENT: History of diffuse large B-cell lymphoma.  PLAN:    1. Diffuse Large B Cell Lymphoma: Patient was initially diagnosed in August 2015, pathology was noted to be anaplastic subtype.  He completed 6 cycles of R-CHOP chemotherapy in January 2016. PET scan January 2017 revealed no evidence of disease.  2.  Urothelial carcinoma: Confirmed by biopsy.  Patient's most recent CT scan on March 01, 2019 reviewed independently and reported as above with progressive left pelvic mass greater than 4 cm.  Patient appears to have no other evidence of disease.  Case discussed with urology.  Given the fact that patient is symptomatic with pain and has localized disease, he will benefit from concurrent chemotherapy using weekly cisplatin 40 mg/m along with daily XRT.  Delay cycle 3 of weekly cisplatin secondary to increased creatinine.  Patient will instead receive IV fluids.  Continue with daily XRT.  Return to clinic in 1 week for further evaluation and reconsideration of cycle 3.   3.  Hydronephrosis: Patient has ureteral stent in place. 4.  Renal insufficiency: Worse today.  Patient's  creatinine is 2.0.  Delay treatment and IV fluids as above. 5.  Pain: Patient is no longer taking narcotics.  Continue tramadol as needed. 6.  Anemia: Hemoglobin is decreased, but stable at 9.5.  Monitor. 7.  DVT: Appreciate vascular surgery input.  Patient will require Eliquis for minimum of 6 months until at least February 2021. 8.  UTI: Urine culture growing 2 separate organisms.  Patient symptoms have improved.  Continue doxycycline and will repeat UA and culture today. 9.  Tremors: Possibly medication induced,  monitor. 10.  Constipation: Have recommended OTC MiraLAX and magnesium citrate.  Patient expressed understanding and was in agreement with this plan. He also understands that He can call clinic at any time with any questions, concerns, or complaints.    Lloyd Huger, MD   04/10/2019 10:54 AM

## 2019-04-06 ENCOUNTER — Other Ambulatory Visit: Payer: Self-pay

## 2019-04-06 ENCOUNTER — Ambulatory Visit
Admission: RE | Admit: 2019-04-06 | Discharge: 2019-04-06 | Disposition: A | Discharge status: Home / Self Care | Payer: PPO | Source: Ambulatory Visit | Attending: Radiation Oncology | Admitting: Radiation Oncology

## 2019-04-06 ENCOUNTER — Other Ambulatory Visit: Payer: Self-pay | Admitting: *Deleted

## 2019-04-06 ENCOUNTER — Other Ambulatory Visit: Payer: Self-pay | Admitting: Oncology

## 2019-04-06 DIAGNOSIS — Z510 Encounter for antineoplastic radiation therapy: Secondary | ICD-10-CM | POA: Diagnosis not present

## 2019-04-06 DIAGNOSIS — C689 Malignant neoplasm of urinary organ, unspecified: Secondary | ICD-10-CM | POA: Diagnosis not present

## 2019-04-06 MED ORDER — MIRABEGRON ER 25 MG PO TB24: 25.0000 mg | ORAL_TABLET | Freq: Every day | ORAL | 5 refills | Status: AC

## 2019-04-07 ENCOUNTER — Ambulatory Visit: Payer: PPO

## 2019-04-07 LAB — URINE CULTURE: Culture: 30000 — AB

## 2019-04-09 ENCOUNTER — Other Ambulatory Visit: Payer: Self-pay

## 2019-04-09 ENCOUNTER — Ambulatory Visit
Admission: RE | Admit: 2019-04-09 | Discharge: 2019-04-09 | Disposition: A | Discharge status: Home / Self Care | Payer: PPO | Source: Ambulatory Visit | Attending: Radiation Oncology | Admitting: Radiation Oncology

## 2019-04-09 DIAGNOSIS — N401 Enlarged prostate with lower urinary tract symptoms: Secondary | ICD-10-CM | POA: Diagnosis not present

## 2019-04-09 DIAGNOSIS — Z48812 Encounter for surgical aftercare following surgery on the circulatory system: Secondary | ICD-10-CM | POA: Diagnosis not present

## 2019-04-09 DIAGNOSIS — E785 Hyperlipidemia, unspecified: Secondary | ICD-10-CM | POA: Diagnosis not present

## 2019-04-09 DIAGNOSIS — I2510 Atherosclerotic heart disease of native coronary artery without angina pectoris: Secondary | ICD-10-CM | POA: Diagnosis not present

## 2019-04-09 DIAGNOSIS — B9620 Unspecified Escherichia coli [E. coli] as the cause of diseases classified elsewhere: Secondary | ICD-10-CM | POA: Diagnosis not present

## 2019-04-09 DIAGNOSIS — Z48816 Encounter for surgical aftercare following surgery on the genitourinary system: Secondary | ICD-10-CM | POA: Diagnosis not present

## 2019-04-09 DIAGNOSIS — Z510 Encounter for antineoplastic radiation therapy: Secondary | ICD-10-CM | POA: Diagnosis not present

## 2019-04-09 DIAGNOSIS — C689 Malignant neoplasm of urinary organ, unspecified: Secondary | ICD-10-CM | POA: Diagnosis not present

## 2019-04-09 DIAGNOSIS — C7989 Secondary malignant neoplasm of other specified sites: Secondary | ICD-10-CM | POA: Diagnosis not present

## 2019-04-09 DIAGNOSIS — Z9181 History of falling: Secondary | ICD-10-CM | POA: Diagnosis not present

## 2019-04-09 DIAGNOSIS — G4700 Insomnia, unspecified: Secondary | ICD-10-CM | POA: Diagnosis not present

## 2019-04-09 DIAGNOSIS — E559 Vitamin D deficiency, unspecified: Secondary | ICD-10-CM | POA: Diagnosis not present

## 2019-04-09 DIAGNOSIS — Z792 Long term (current) use of antibiotics: Secondary | ICD-10-CM | POA: Diagnosis not present

## 2019-04-09 DIAGNOSIS — R3914 Feeling of incomplete bladder emptying: Secondary | ICD-10-CM | POA: Diagnosis not present

## 2019-04-09 DIAGNOSIS — N136 Pyonephrosis: Secondary | ICD-10-CM | POA: Diagnosis not present

## 2019-04-09 DIAGNOSIS — C8330 Diffuse large B-cell lymphoma, unspecified site: Secondary | ICD-10-CM | POA: Diagnosis not present

## 2019-04-09 DIAGNOSIS — R338 Other retention of urine: Secondary | ICD-10-CM | POA: Diagnosis not present

## 2019-04-09 DIAGNOSIS — C679 Malignant neoplasm of bladder, unspecified: Secondary | ICD-10-CM | POA: Diagnosis not present

## 2019-04-09 DIAGNOSIS — K219 Gastro-esophageal reflux disease without esophagitis: Secondary | ICD-10-CM | POA: Diagnosis not present

## 2019-04-09 DIAGNOSIS — I10 Essential (primary) hypertension: Secondary | ICD-10-CM | POA: Diagnosis not present

## 2019-04-09 DIAGNOSIS — Z87891 Personal history of nicotine dependence: Secondary | ICD-10-CM | POA: Diagnosis not present

## 2019-04-09 DIAGNOSIS — T783XXD Angioneurotic edema, subsequent encounter: Secondary | ICD-10-CM | POA: Diagnosis not present

## 2019-04-09 DIAGNOSIS — H9193 Unspecified hearing loss, bilateral: Secondary | ICD-10-CM | POA: Diagnosis not present

## 2019-04-10 ENCOUNTER — Inpatient Hospital Stay: Payer: PPO

## 2019-04-10 ENCOUNTER — Encounter: Payer: Self-pay | Admitting: Oncology

## 2019-04-10 ENCOUNTER — Other Ambulatory Visit: Payer: Self-pay

## 2019-04-10 ENCOUNTER — Ambulatory Visit
Admission: RE | Admit: 2019-04-10 | Discharge: 2019-04-10 | Disposition: A | Discharge status: Home / Self Care | Payer: PPO | Source: Ambulatory Visit | Attending: Radiation Oncology | Admitting: Radiation Oncology

## 2019-04-10 ENCOUNTER — Inpatient Hospital Stay (HOSPITAL_BASED_OUTPATIENT_CLINIC_OR_DEPARTMENT_OTHER): Payer: PPO | Admitting: Oncology

## 2019-04-10 VITALS — BP 101/63 | HR 82 | Temp 97.5°F | Resp 18 | Wt 238.0 lb

## 2019-04-10 DIAGNOSIS — R339 Retention of urine, unspecified: Secondary | ICD-10-CM

## 2019-04-10 DIAGNOSIS — C689 Malignant neoplasm of urinary organ, unspecified: Secondary | ICD-10-CM

## 2019-04-10 DIAGNOSIS — Z5111 Encounter for antineoplastic chemotherapy: Secondary | ICD-10-CM | POA: Diagnosis not present

## 2019-04-10 DIAGNOSIS — Z510 Encounter for antineoplastic radiation therapy: Secondary | ICD-10-CM | POA: Diagnosis not present

## 2019-04-10 LAB — CBC WITH DIFFERENTIAL/PLATELET
Abs Immature Granulocytes: 0.08 10*3/uL — ABNORMAL HIGH (ref 0.00–0.07)
Basophils Absolute: 0 10*3/uL (ref 0.0–0.1)
Basophils Relative: 1 %
Eosinophils Absolute: 0.4 10*3/uL (ref 0.0–0.5)
Eosinophils Relative: 6 %
HCT: 29.5 % — ABNORMAL LOW (ref 39.0–52.0)
Hemoglobin: 9.5 g/dL — ABNORMAL LOW (ref 13.0–17.0)
Immature Granulocytes: 1 %
Lymphocytes Relative: 9 %
Lymphs Abs: 0.6 10*3/uL — ABNORMAL LOW (ref 0.7–4.0)
MCH: 27.5 pg (ref 26.0–34.0)
MCHC: 32.2 g/dL (ref 30.0–36.0)
MCV: 85.5 fL (ref 80.0–100.0)
Monocytes Absolute: 0.8 10*3/uL (ref 0.1–1.0)
Monocytes Relative: 12 %
Neutro Abs: 4.6 10*3/uL (ref 1.7–7.7)
Neutrophils Relative %: 71 %
Platelets: 281 10*3/uL (ref 150–400)
RBC: 3.45 MIL/uL — ABNORMAL LOW (ref 4.22–5.81)
RDW: 14.6 % (ref 11.5–15.5)
WBC: 6.4 10*3/uL (ref 4.0–10.5)
nRBC: 0 % (ref 0.0–0.2)

## 2019-04-10 LAB — URINALYSIS, COMPLETE (UACMP) WITH MICROSCOPIC
Bacteria, UA: NONE SEEN
Bilirubin Urine: NEGATIVE
Glucose, UA: NEGATIVE mg/dL
Ketones, ur: NEGATIVE mg/dL
Nitrite: NEGATIVE
Protein, ur: 30 mg/dL — AB
RBC / HPF: >50 RBC/hpf — ABNORMAL HIGH (ref 0–5)
Specific Gravity, Urine: 1.011 (ref 1.005–1.030)
Squamous Epithelial / HPF: NONE SEEN (ref 0–5)
WBC, UA: >50 WBC/hpf — ABNORMAL HIGH (ref 0–5)
pH: 7 (ref 5.0–8.0)

## 2019-04-10 LAB — BASIC METABOLIC PANEL
Anion gap: 10 (ref 5–15)
BUN: 29 mg/dL — ABNORMAL HIGH (ref 8–23)
CO2: 28 mmol/L (ref 22–32)
Calcium: 8.9 mg/dL (ref 8.9–10.3)
Chloride: 97 mmol/L — ABNORMAL LOW (ref 98–111)
Creatinine, Ser: 2 mg/dL — ABNORMAL HIGH (ref 0.61–1.24)
GFR calc Af Amer: 34 mL/min — ABNORMAL LOW (ref >60)
GFR calc non Af Amer: 30 mL/min — ABNORMAL LOW (ref >60)
Glucose, Bld: 124 mg/dL — ABNORMAL HIGH (ref 70–99)
Potassium: 4.1 mmol/L (ref 3.5–5.1)
Sodium: 135 mmol/L (ref 135–145)

## 2019-04-10 MED ORDER — SODIUM CHLORIDE 0.9 % IV SOLN
INTRAVENOUS | Status: DC
  Administered 2019-04-10: 10:00:00 via INTRAVENOUS
  Filled 2019-04-10 (×2): qty 250

## 2019-04-10 NOTE — Progress Notes (Signed)
Patient is having tremors in his hands.  Constipated with last small bowel movement yesterday.  He has taken Metamucil, prune juice, and a laxative.  Reports also having low abdominal pain for about a week.  Frequent urination worse during the night.  Has lower back pain that worsens during the day.

## 2019-04-11 ENCOUNTER — Other Ambulatory Visit: Payer: Self-pay

## 2019-04-11 ENCOUNTER — Ambulatory Visit
Admission: RE | Admit: 2019-04-11 | Discharge: 2019-04-11 | Disposition: A | Discharge status: Home / Self Care | Payer: PPO | Source: Ambulatory Visit | Attending: Radiation Oncology | Admitting: Radiation Oncology

## 2019-04-11 ENCOUNTER — Other Ambulatory Visit: Payer: Self-pay | Admitting: *Deleted

## 2019-04-11 DIAGNOSIS — C689 Malignant neoplasm of urinary organ, unspecified: Secondary | ICD-10-CM | POA: Diagnosis not present

## 2019-04-11 DIAGNOSIS — Z510 Encounter for antineoplastic radiation therapy: Secondary | ICD-10-CM | POA: Diagnosis not present

## 2019-04-11 MED ORDER — DOXYCYCLINE HYCLATE 100 MG PO TABS: 100.0000 mg | ORAL_TABLET | Freq: Two times a day (BID) | ORAL | 0 refills | Status: DC

## 2019-04-12 ENCOUNTER — Other Ambulatory Visit: Payer: Self-pay

## 2019-04-12 ENCOUNTER — Ambulatory Visit
Admission: RE | Admit: 2019-04-12 | Discharge: 2019-04-12 | Disposition: A | Discharge status: Home / Self Care | Payer: PPO | Source: Ambulatory Visit | Attending: Radiation Oncology | Admitting: Radiation Oncology

## 2019-04-12 DIAGNOSIS — Z510 Encounter for antineoplastic radiation therapy: Secondary | ICD-10-CM | POA: Diagnosis not present

## 2019-04-12 DIAGNOSIS — C689 Malignant neoplasm of urinary organ, unspecified: Secondary | ICD-10-CM | POA: Diagnosis not present

## 2019-04-13 ENCOUNTER — Other Ambulatory Visit: Payer: Self-pay

## 2019-04-13 ENCOUNTER — Ambulatory Visit
Admission: RE | Admit: 2019-04-13 | Discharge: 2019-04-13 | Disposition: A | Discharge status: Home / Self Care | Payer: PPO | Source: Ambulatory Visit | Attending: Radiation Oncology | Admitting: Radiation Oncology

## 2019-04-13 DIAGNOSIS — Z510 Encounter for antineoplastic radiation therapy: Secondary | ICD-10-CM | POA: Diagnosis not present

## 2019-04-13 DIAGNOSIS — C689 Malignant neoplasm of urinary organ, unspecified: Secondary | ICD-10-CM | POA: Diagnosis not present

## 2019-04-13 LAB — URINE CULTURE: Culture: 70000 — AB

## 2019-04-13 NOTE — Progress Notes (Signed)
Bryan Nelson  [phone]  [phone]     Bryan Nelson DOB: 1932-02-05  MR#: [id number]  YPP#:[id number]  Patient Care Team: [name], MD as PCP - General (Family Medicine) Dingeldein, Remo Lipps, MD as Consulting Physician (Ophthalmology) Hollice Espy, MD as Consulting Physician (Urology) Yolonda Kida, MD as Consulting Physician (Cardiology) Laneta Simmers as Physician Assistant (Urology)   CHIEF COMPLAINT: History of diffuse large B-cell lymphoma, now with urothelial carcinoma.  INTERVAL HISTORY: Patient returns to clinic today for further evaluation and reconsideration of cycle 3 of weekly cisplatin.  He continues to have urinary frequency and back pain, but otherwise feels well.  He has insomnia, but blames this on his urinary frequency.  He continues to have a mild tremor, but no other neurologic complaints.  He denies any recent fevers or illnesses.  He has a fair appetite and denies weight loss.  He denies any chest pain, shortness of breath, cough, or hemoptysis.  He has no nausea, vomiting, or diarrhea.  He denies any melena or hematochezia.  Patient offers no further specific complaints today.  REVIEW OF SYSTEMS:   Review of Systems  Constitutional: Negative.  Negative for diaphoresis, fever, malaise/fatigue and weight loss.  Respiratory: Negative.  Negative for cough and shortness of breath.   Cardiovascular: Negative.  Negative for chest pain and leg swelling.  Gastrointestinal: Negative.  Negative for abdominal pain, blood in stool and melena.  Genitourinary: Positive for flank pain and frequency. Negative for dysuria, hematuria and urgency.  Musculoskeletal: Positive for back pain. Negative for joint pain.  Skin: Negative.  Negative for rash.  Neurological: Positive for tremors. Negative for dizziness, focal weakness, weakness and headaches.  Psychiatric/Behavioral: The patient has insomnia. The patient is  not nervous/anxious.     As per HPI. Otherwise, a complete review of systems is negative.  ONCOLOGY HISTORY: Oncology History Overview Note   Diffuse Large B Cell Lymphoma: Patient was initially diagnosed in August 2015, pathology was noted to be anaplastic subtype.  He completed 6 cycles of R-CHOP chemotherapy in January 2016. PET scan January 2017 revealed no evidence of disease.  2.  Urothelial carcinoma: Confirmed by biopsy.  Patient's most recent CT scan on March 01, 2019 reviewed independently and reported as above with progressive left pelvic mass greater than 4 cm.  Patient appears to have no other evidence of disease.  Case discussed with urology.  Given  the fact that patient is symptomatic with pain and has localized disease, he will benefit from concurrent chemotherapy using weekly cisplatin 40 mg/m along with daily XRT.  Delay cycle 1 of cisplatin today secondary to elevated creatinine.  Continue daily XRT.  Return to clinic in 1 week for further evaluation and reconsideration of cycle 2.     DLBCL (diffuse large B cell lymphoma) ([place])  04/10/2014 Initial Diagnosis   DLBCL (diffuse large B cell lymphoma) ([place])     PAST MEDICAL HISTORY: Past Medical History:  Diagnosis Date  . Arteriosclerosis of coronary artery 08/26/2011   Overview:      a.  1999 PCI of the mid LAD with stent.      b.  2002 Cath [place]: EF 56%. RCA-distal 25/25%. Left main-50% ostial.  Left circumflex-25% OM2.  LAD-25% proximal.  75% mid.  25/25% distal. 75% D1.      c.  07/2011 PCI of RCA with DES.[place]   . Bleeding gastrointestinal 08/26/2011   Overview:      a.  Chronic abdominal pain, present improving.      b.  Duodenitis and gastritis by EGD in 2000.      c.  Pylori in 2000, treated.      d.  Gastroesophageal reflux disease.      e.  Diverticular disease.    . BP (high blood pressure) 08/26/2011  . Closed dislocation of right ankle 09/10/2016   Successfully reduced in ER 09/10/2016  . Diffuse large B-cell  lymphoma ([place]) 2015   chemo tx's  . GERD (gastroesophageal reflux disease)   . Heart disease   . Helicobacter pylori infection 06/18/2015   by EGD RX 08/18/1999   . History of adenomatous polyp of colon 06/18/2015  . HOH (hard of hearing)    Bilateral Hearing  Aids  . Malleolar fracture (Right) 09/10/2016    PAST SURGICAL HISTORY: Past Surgical History:  Procedure Laterality Date  . ANGIOPLASTY    . CATARACT EXTRACTION    . COLONOSCOPY  2016  . CORONARY ANGIOPLASTY    . CYSTOSCOPY WITH STENT PLACEMENT Left 03/17/2019   Procedure: CYSTOSCOPY WITH STENT PLACEMENT;  Surgeon: Festus Aloe, MD;  Location: [facility] ORS;  Service: Urology;  Laterality: Left;  . EYE SURGERY Bilateral    Cataract Extraction with IOL  . GREEN LIGHT LASER TURP (TRANSURETHRAL RESECTION OF PROSTATE N/A 04/16/2015   Procedure: GREEN LIGHT LASER TURP (TRANSURETHRAL RESECTION OF PROSTATE WITH BLADDER BIOPSY;  Surgeon: Collier Flowers, MD;  Location: [facility];  Service: Urology;  Laterality: N/A;  . heart stent placement     1998, 2000, 2012  . PERIPHERAL VASCULAR THROMBECTOMY Left 03/22/2019   Procedure: PERIPHERAL VASCULAR THROMBECTOMY and possible IVC filter;  Surgeon: Algernon Huxley, MD;  Location: [facility];  Service: Cardiovascular;  Laterality: Left;  . TRANSURETHRAL RESECTION OF PROSTATE N/A 03/02/2017   Procedure: TRANSURETHRAL RESECTION OF THE PROSTATE (TURP);  Surgeon: Hollice Espy, MD;  Location: [facility];  Service: Urology;  Laterality: N/A;    FAMILY HISTORY Family History  Problem Relation Age of Onset  . Osteoporosis Mother   . Alzheimer's disease Father   . Kidney disease Neg Hx   . Prostate cancer Neg Hx   . Bladder Cancer Neg Hx   . Kidney cancer Neg Hx    AVANCED DIRECTIVES:    HEALTH MAINTENANCE: Social History   Tobacco Use  . Smoking status: Former Smoker    Packs/day: 1.50    Years: 12.00    Pack years: 18.00    Types: Cigarettes    Quit date: 08/24/1971    Years  since quitting: 47.6  . Smokeless tobacco: Never Used  Substance Use Topics  . Alcohol use: No    Alcohol/week: 0.0 standard drinks  . Drug use: No    Allergies  Allergen Reactions  . Augmentin [Amoxicillin-Pot Clavulanate]     Tongue swelling 2 days after taking  . Ciprofloxacin Diarrhea  . Lisinopril Other (See Comments)    Unknown   . Penicillins Swelling    unknwon reaction.  tolerates amoxicillin Has patient had a PCN reaction causing immediate rash, facial/tongue/throat swelling, SOB or lightheadedness with hypotension: No Has patient had a PCN reaction causing severe rash involving mucus membranes or skin necrosis: No Has patient had a PCN reaction that required hospitalization: No Has patient had a PCN reaction occurring within the last 10 years: Yes If all of the above answers are "NO", then may proceed with Cephalosporin use.   . Bactrim [Sulfamethoxazole-Trimethoprim] Rash  . Sulfa  Antibiotics Itching and Rash    Current Outpatient Medications  Medication Sig Dispense Refill  . ALPRAZolam (XANAX) 0.5 MG tablet Take 1 tablet (0.5 mg total) by mouth at bedtime as needed for anxiety. 30 tablet 1  . apixaban (ELIQUIS) 5 MG TABS tablet Take 1 tablet (5 mg total) by mouth 2 (two) times daily. 60 tablet 3  . atorvastatin (LIPITOR) 10 MG tablet Take 1 tablet (10 mg total) by mouth daily. 30 tablet 11  . Cholecalciferol (VITAMIN D3) 1000 units CAPS Take 1,000 Units by mouth daily.    Marland Kitchen doxycycline (VIBRA-TABS) 100 MG tablet Take 1 tablet (100 mg total) by mouth 2 (two) times daily. 14 tablet 0  . famotidine (PEPCID) 20 MG tablet Take 1 tablet (20 mg total) by mouth daily. 180 tablet 3  . finasteride (PROSCAR) 5 MG tablet Take 1 tablet (5 mg total) by mouth daily. 90 tablet 3  . gabapentin (NEURONTIN) 300 MG capsule Take 1 capsule (300 mg total) by mouth at bedtime. Do not take if you take temazepam 30 capsule 6  . hydrochlorothiazide (HYDRODIURIL) 25 MG tablet Take 0.5 tablets  (12.5 mg total) by mouth daily.    Marland Kitchen HYDROcodone-acetaminophen (NORCO) 7.5-325 MG tablet Take 1 tablet by mouth every 6 (six) hours as needed for up to 5 days for moderate pain. 20 tablet 0  . isosorbide dinitrate (ISORDIL) 30 MG tablet Take 30 mg by mouth daily.    . Magnesium 250 MG TABS Take 250 mg by mouth daily.    . metoprolol succinate (TOPROL-XL) 25 MG 24 hr tablet Take 1 tablet (25 mg total) by mouth daily. 90 tablet 4  . mirabegron ER (MYRBETRIQ) 25 MG TB24 tablet Take 1 tablet (25 mg total) by mouth daily. 30 tablet 5  . Multiple Vitamins-Minerals (VISION FORMULA 2 PO) Take by mouth daily.    . naproxen sodium (ALEVE) 220 MG tablet Take 220 mg by mouth daily as needed.    . ondansetron (ZOFRAN) 8 MG tablet Take 1 tablet (8 mg total) by mouth 2 (two) times daily as needed for nausea or vomiting. 20 tablet 3  . polyethylene glycol powder (CLEARLAX) 17 GM/SCOOP powder Take 1 Container by mouth daily.    . prochlorperazine (COMPAZINE) 10 MG tablet Take 1 tablet (10 mg total) by mouth every 6 (six) hours as needed for nausea or vomiting. 30 tablet 3  . psyllium (METAMUCIL) 58.6 % packet Take 1 packet by mouth daily.    . traMADol (ULTRAM) 50 MG tablet Take by mouth every 6 (six) hours as needed. Patient is Unsure of dose.    . zolpidem (AMBIEN) 10 MG tablet Take 1 tablet (10 mg total) by mouth at bedtime as needed for sleep. 30 tablet 0   No current facility-administered medications for this visit.    Facility-Administered Medications Ordered in Other Visits  Medication Dose Route Frequency Provider Last Rate Last Dose  . CISplatin (PLATINOL) 85 mg in sodium chloride 0.9 % 250 mL chemo infusion  35 mg/m2 (Treatment Plan Recorded) Intravenous Once Lloyd Huger, MD      . dextrose 5 % and 0.45% NaCl 1,000 mL with potassium chloride 20 mEq, magnesium sulfate 12 mEq infusion   Intravenous Once Lloyd Huger, MD      . fosaprepitant (EMEND) 150 mg, dexamethasone (DECADRON) 12 mg in  sodium chloride 0.9 % 145 mL IVPB   Intravenous Once Lloyd Huger, MD      . palonosetron (ALOXI) injection 0.25  mg  0.25 mg Intravenous Once Lloyd Huger, MD        OBJECTIVE: BP 111/70   Pulse (!) 108   Temp 99 F (37.2 C) (Tympanic)   Resp 18   Wt 236 lb 4.8 oz (107.2 kg)   BMI 31.18 kg/m    Body mass index is 31.18 kg/m.    ECOG FS:0 - Asymptomatic  General: Well-developed, well-nourished, no acute distress. Eyes: Pink conjunctiva, anicteric sclera. HEENT: Normocephalic, moist mucous membranes. Lungs: Clear to auscultation bilaterally. Heart: Regular rate and rhythm. No rubs, murmurs, or gallops. Abdomen: Soft, nontender, nondistended. No organomegaly noted, normoactive bowel sounds. Musculoskeletal: No edema, cyanosis, or clubbing. Neuro: Alert, answering all questions appropriately. Cranial nerves grossly intact. Skin: No rashes or petechiae noted. Psych: Normal affect.   LAB RESULTS:  Orders Only on 04/17/2019  Component Date Value Ref Range Status  . Sodium 04/17/2019 140  135 - 145 mmol/L Final  . Potassium 04/17/2019 3.9  3.5 - 5.1 mmol/L Final  . Chloride 04/17/2019 103  98 - 111 mmol/L Final  . CO2 04/17/2019 26  22 - 32 mmol/L Final  . Glucose, Bld 04/17/2019 113* 70 - 99 mg/dL Final  . BUN 04/17/2019 32* 8 - 23 mg/dL Final  . Creatinine, Ser 04/17/2019 1.68* 0.61 - 1.24 mg/dL Final  . Calcium 04/17/2019 8.9  8.9 - 10.3 mg/dL Final  . GFR calc non Af Amer 04/17/2019 36* >60 mL/min Final  . GFR calc Af Amer 04/17/2019 42* >60 mL/min Final  . Anion gap 04/17/2019 11  5 - 15 Final   Performed at [facility], [address], [place], [place] [address]  . WBC 04/17/2019 6.1  4.0 - 10.5 K/uL Final  . RBC 04/17/2019 3.30* 4.22 - 5.81 MIL/uL Final  . Hemoglobin 04/17/2019 9.0* 13.0 - 17.0 g/dL Final  . HCT 04/17/2019 28.2* 39.0 - 52.0 % Final  . MCV 04/17/2019 85.5  80.0 - 100.0 fL Final  . MCH 04/17/2019 27.3  26.0 - 34.0 pg Final  . MCHC  04/17/2019 31.9  30.0 - 36.0 g/dL Final  . RDW 04/17/2019 15.1  11.5 - 15.5 % Final  . Platelets 04/17/2019 200  150 - 400 K/uL Final  . nRBC 04/17/2019 0.0  0.0 - 0.2 % Final  . Neutrophils Relative % 04/17/2019 77  % Final  . Neutro Abs 04/17/2019 4.7  1.7 - 7.7 K/uL Final  . Lymphocytes Relative 04/17/2019 5  % Final  . Lymphs Abs 04/17/2019 0.3* 0.7 - 4.0 K/uL Final  . Monocytes Relative 04/17/2019 11  % Final  . Monocytes Absolute 04/17/2019 0.7  0.1 - 1.0 K/uL Final  . Eosinophils Relative 04/17/2019 6  % Final  . Eosinophils Absolute 04/17/2019 0.4  0.0 - 0.5 K/uL Final  . Basophils Relative 04/17/2019 0  % Final  . Basophils Absolute 04/17/2019 0.0  0.0 - 0.1 K/uL Final  . Immature Granulocytes 04/17/2019 1  % Final  . Abs Immature Granulocytes 04/17/2019 0.03  0.00 - 0.07 K/uL Final   Performed at [facility], [address] [place], [place] [address]  Office Visit on 04/16/2019  Component Date Value Ref Range Status  . Color, UA 04/16/2019 yellow   Final  . Clarity, UA 04/16/2019 clear   Final  . Glucose, UA 04/16/2019 Negative  Negative Final  . Bilirubin, UA 04/16/2019 negative   Final  . Ketones, UA 04/16/2019 negative   Final  . Spec Grav, UA 04/16/2019 1.015  1.010 -  1.025 Final  . Blood, UA 04/16/2019 Moderate (non-hemolyzed)   Final  . pH, UA 04/16/2019 6.0  5.0 - 8.0 Final  . Protein, UA 04/16/2019 Positive* Negative Final  . Urobilinogen, UA 04/16/2019 0.2  0.2 or 1.0 E.U./dL Final  . Nitrite, UA 04/16/2019 negative   Final  . Leukocytes, UA 04/16/2019 Moderate (2+)* Negative Final    STUDIES: No results found.   ASSESSMENT: History of diffuse large B-cell lymphoma.  PLAN:    1. Diffuse Large B Cell Lymphoma: Patient was initially diagnosed in August 2015, pathology was noted to be anaplastic subtype.  He completed 6 cycles of R-CHOP chemotherapy in January 2016. PET scan January 2017 revealed no evidence of disease.  2.  Urothelial carcinoma:  Confirmed by biopsy.  Patient's most recent CT scan on March 01, 2019 reviewed independently and reported as above with progressive left pelvic mass greater than 4 cm.  Patient appears to have no other evidence of disease.  Case discussed with urology.  Given the fact that patient is symptomatic with pain and has localized disease, he will benefit from concurrent chemotherapy using weekly cisplatin 40 mg/m along with daily XRT.  Proceed with cycle 3 of weekly cisplatin today.  Continue with daily XRT completing on April 25, 2019.  Return to clinic in 1 week for further evaluation and consideration of cycle 4.   3.  Hydronephrosis: Patient has ureteral stent in place. 4.  Renal insufficiency: Improved, proceed with treatment as above. 5.  Pain: Improved.  Patient received a prescription for hydrocodone from his primary care physician yesterday. 6.  Anemia: Hemoglobin is slowly trending down is now 9.0.  Monitor. 7.  DVT: Appreciate vascular surgery input.  Patient will require Eliquis for minimum of 6 months until at least February 2021. 8.  UTI: Patient symptoms appear unchanged.  UA at primary care physician continue to be positive.  Continue doxycycline and will repeat UA and culture today. 9.  Tremors: Possibly medication induced, monitor. 10.  Constipation: Patient does not complain of this today.  Have recommended OTC MiraLAX and magnesium citrate. 11.  Insomnia: Likely secondary to urinary frequency.  Monitor.  Patient expressed understanding and was in agreement with this plan. He also understands that He can call clinic at any time with any questions, concerns, or complaints.    Lloyd Huger, MD   04/17/2019 10:04 AM

## 2019-04-16 ENCOUNTER — Encounter: Payer: Self-pay | Admitting: Family Medicine

## 2019-04-16 ENCOUNTER — Ambulatory Visit (INDEPENDENT_AMBULATORY_CARE_PROVIDER_SITE_OTHER): Payer: PPO | Admitting: Family Medicine

## 2019-04-16 ENCOUNTER — Ambulatory Visit
Admission: RE | Admit: 2019-04-16 | Discharge: 2019-04-16 | Disposition: A | Discharge status: Home / Self Care | Payer: PPO | Source: Ambulatory Visit | Attending: Radiation Oncology | Admitting: Radiation Oncology

## 2019-04-16 ENCOUNTER — Ambulatory Visit: Payer: PPO

## 2019-04-16 ENCOUNTER — Other Ambulatory Visit: Payer: Self-pay

## 2019-04-16 VITALS — BP 128/58 | HR 98 | Temp 96.9°F | Resp 20 | Wt 239.0 lb

## 2019-04-16 DIAGNOSIS — Z510 Encounter for antineoplastic radiation therapy: Secondary | ICD-10-CM | POA: Diagnosis not present

## 2019-04-16 DIAGNOSIS — C689 Malignant neoplasm of urinary organ, unspecified: Secondary | ICD-10-CM | POA: Diagnosis not present

## 2019-04-16 DIAGNOSIS — N3090 Cystitis, unspecified without hematuria: Secondary | ICD-10-CM | POA: Diagnosis not present

## 2019-04-16 DIAGNOSIS — N390 Urinary tract infection, site not specified: Secondary | ICD-10-CM | POA: Diagnosis not present

## 2019-04-16 DIAGNOSIS — I82412 Acute embolism and thrombosis of left femoral vein: Secondary | ICD-10-CM | POA: Diagnosis not present

## 2019-04-16 LAB — POCT URINALYSIS DIPSTICK
Bilirubin, UA: NEGATIVE
Glucose, UA: NEGATIVE
Ketones, UA: NEGATIVE
Nitrite, UA: NEGATIVE
Protein, UA: POSITIVE — AB
Spec Grav, UA: 1.015 (ref 1.010–1.025)
Urobilinogen, UA: 0.2 E.U./dL
pH, UA: 6 (ref 5.0–8.0)

## 2019-04-16 MED ORDER — FOSFOMYCIN TROMETHAMINE 3 G PO PACK: 3.0000 g | PACK | Freq: Once | ORAL | 0 refills | Status: DC

## 2019-04-16 MED ORDER — HYDROCODONE-ACETAMINOPHEN 7.5-325 MG PO TABS: 1.0000 | ORAL_TABLET | Freq: Four times a day (QID) | ORAL | 0 refills | Status: AC | PRN

## 2019-04-16 NOTE — Discharge Summary (Signed)
[facility]    Discharge Summary  Patient ID:  Bryan Nelson MRN: [id number] DOB/AGE: [birth year] 85 y.o.  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 Arteriosclerosis of coronary artery 08/26/2011   Overview:      a.  1999 PCI of the mid LAD with stent.      b.  2002 Cath [place]: EF 56%. RCA-distal 25/25%. Left main-50% ostial.  Left circumflex-25% OM2.  LAD-25% proximal.  75% mid.  25/25% distal. 75% D1.      c.  07/2011 PCI of RCA with DES.[place]   . Bleeding gastrointestinal 08/26/2011   Overview:      a.  Chronic abdominal pain, present improving.      b.  Duodenitis and gastritis by EGD in 2000.      c.  Pylori in 2000, treated.      d.  Gastroesophageal reflux disease.      e.  Diverticular disease.    . BP (high blood pressure) 08/26/2011  . Closed dislocation of right ankle 09/10/2016   Successfully reduced in ER 09/10/2016  . Diffuse large B-cell lymphoma ([place]) 2015   chemo tx's  . GERD (gastroesophageal reflux disease)   . Heart disease   . Helicobacter pylori infection 06/18/2015   by EGD RX 08/18/1999   . History of adenomatous polyp of colon 06/18/2015  . HOH (hard of hearing)    Bilateral Hearing  Aids  . Malleolar fracture (Right) 09/10/2016   Procedure(s): PERIPHERAL VASCULAR THROMBECTOMY and possible IVC filter  Discharged Condition: good  HPI / Hospital Course:  The patient is an 85 year old male with a past medical history of diffuse large B-cell lymphoma, urothelial cancer, coronary artery disease, hyperlipidemia, hypertension, diverticulitis, GERD with a chief complaint of left lower extremity pain and swelling.  The patient  was found to have an extensive occlusive DVT extending from the left femoral vein to the left tibial veins.  On March 22, 2019 the patient underwent 1. US guidance for vascular access to left popliteal vein 2. Catheter placement into left common iliac vein from left popliteal approach 3. IVC gram and LLE venogram 4.  Catheter directed thrombolysis with 4 mg of tpa to the left iliac vein 5. Mechanical thrombectomy to left external iliac vein and common iliac vein with the penumbra cat 12 device 6. PTA of the left common and external iliac veins with 12 mm diameter by 8 cm length balloon 7. Stent placement to the left common and external iliac veins with 16 mm diameter by 10 cm length Venovo stent  The patient tolerated procedure is transferred from the endovascular suite to the surgical floor without issue.  The patient sign a procedure was unremarkable. Heparin drip was initiated and later transitioned to Eliquis without issue.  During the patient's brief inpatient stay his diet was advanced without complication, he was urinating independently, his discomfort was controlled to the use of p.o. pain medication and he was ambulating at his baseline.  At discharge, the patient was afebrile with stable vital signs.  His physical exam was to be as expected.  Physical exam:  General appearance: alert, cooperative and appears stated age Head: Normocephalic, without obvious abnormality, atraumatic Ears: normal TM's and external  ear canals both ears Neck: no adenopathy, no carotid bruit, no JVD, supple, symmetrical, trachea midline and thyroid not enlarged, symmetric, no tenderness/mass/nodules Back: symmetric, no curvature. ROM normal. No CVA tenderness. Resp: clear to auscultation bilaterally Extremities: extremities normal, atraumatic, no cyanosis or edema and Significantly reduced edema of the left lower extremity.  The popliteal venous access site looks clean without erythema the dressing  was removed and a new dressing was applied.  Labs as below  Complications: None  Consults:  Treatment Team:  Algernon Huxley, MD  Significant Diagnostic Studies: CBC Lab Results  Component Value Date   WBC 6.4 04/10/2019   HGB 9.5 (L) 04/10/2019   HCT 29.5 (L) 04/10/2019   MCV 85.5 04/10/2019   PLT 281 04/10/2019   BMET    Component Value Date/Time   NA 135 04/10/2019 0803   NA 140 11/09/2018 0908   NA 144 09/16/2014 0928   K 4.1 04/10/2019 0803   K 3.5 09/16/2014 0928   CL 97 (L) 04/10/2019 0803   CL 105 09/16/2014 0928   CO2 28 04/10/2019 0803   CO2 31 09/16/2014 0928   GLUCOSE 124 (H) 04/10/2019 0803   GLUCOSE 106 (H) 09/16/2014 0928   BUN 29 (H) 04/10/2019 0803   BUN 19 11/09/2018 0908   BUN 17 09/16/2014 0928   CREATININE 2.00 (H) 04/10/2019 0803   CREATININE 1.01 09/16/2014 0928   CALCIUM 8.9 04/10/2019 0803   CALCIUM 8.4 (L) 09/16/2014 0928   GFRNONAA 30 (L) 04/10/2019 0803   GFRNONAA >60 09/16/2014 0928   GFRNONAA >60 04/30/2014 1121   GFRAA 34 (L) 04/10/2019 0803   GFRAA >60 09/16/2014 0928   GFRAA >60 04/30/2014 1121   COAG Lab Results  Component Value Date   INR 1.0 03/22/2019   INR 0.9 02/21/2019   INR 0.9 05/27/2014   Disposition:  Discharge to :Home  Allergies as of 03/24/2019      Reactions   Augmentin [amoxicillin-pot Clavulanate]    Tongue swelling 2 days after taking   Ciprofloxacin Diarrhea   Lisinopril Other (See Comments)   Unknown    Penicillins Swelling   unknwon reaction.  tolerates amoxicillin Has patient had a PCN reaction causing immediate rash, facial/tongue/throat swelling, SOB or lightheadedness with hypotension: No Has patient had a PCN reaction causing severe rash involving mucus membranes or skin necrosis: No Has patient had a PCN reaction that required hospitalization: No Has patient had a PCN reaction occurring within the last 10 years: Yes If all of the above answers are "NO", then may proceed with Cephalosporin  use.   Bactrim [sulfamethoxazole-trimethoprim] Rash   Sulfa Antibiotics Itching, Rash      Medication List    STOP taking these medications   doxycycline 100 MG tablet Commonly known as: ADOXA     TAKE these medications   ALPRAZolam 0.5 MG tablet Commonly known as: XANAX Take 1 tablet (0.5 mg total) by mouth at bedtime as needed for anxiety.   apixaban 5 MG Tabs tablet Commonly known as: ELIQUIS Take 1 tablet (5 mg total) by mouth 2 (two) times daily.   atorvastatin 10 MG tablet Commonly known as: Lipitor Take 1 tablet (10 mg total) by mouth daily.   famotidine 20 MG tablet Commonly known as: Pepcid Take 1 tablet (20 mg total) by mouth daily. What changed: when to take this   finasteride 5 MG tablet Commonly known as: Proscar Take 1 tablet (5 mg total) by mouth daily.   gabapentin 300 MG  capsule Commonly known as: NEURONTIN Take 1 capsule (300 mg total) by mouth at bedtime. Do not take if you take temazepam   isosorbide dinitrate 30 MG tablet Commonly known as: ISORDIL Take 30 mg by mouth daily.   Magnesium 250 MG Tabs Take 250 mg by mouth daily.   metoprolol succinate 25 MG 24 hr tablet Commonly known as: TOPROL-XL Take 1 tablet (25 mg total) by mouth daily.   naproxen sodium 220 MG tablet Commonly known as: ALEVE Take 220 mg by mouth daily as needed.   ondansetron 8 MG tablet Commonly known as: ZOFRAN Take 1 tablet (8 mg total) by mouth 2 (two) times daily as needed for nausea or vomiting.   prochlorperazine 10 MG tablet Commonly known as: COMPAZINE Take 1 tablet (10 mg total) by mouth every 6 (six) hours as needed for nausea or vomiting.   psyllium 58.6 % packet Commonly known as: METAMUCIL Take 1 packet by mouth daily.   VISION FORMULA 2 PO Take by mouth daily. What changed: Another medication with the same name was removed. Continue taking this medication, and follow the directions you see here.   Vitamin D3 25 MCG (1000 UT) Caps Take 1,000  Units by mouth daily.      Verbal and written Discharge instructions given to the patient. Wound care per Discharge AVS Follow-up Information    Schnier, Dolores Lory, MD In 2 weeks.   Specialties: Vascular Surgery, Cardiology, Radiology, Vascular Surgery Why: for a follow-up appointment  Contact information: [place] [address] [phone]          Signed: Sela Hua, PA-C  04/16/2019, 11:59 AM

## 2019-04-16 NOTE — Progress Notes (Signed)
Patient: Bryan Nelson Male    DOB: 1932/07/28   85 y.o.   MRN: [id number] Visit Date: 04/16/2019  Today's Provider: Lelon Huh, MD   No chief complaint on file.  Subjective:     HPI  Hypertension, follow-up:  BP Readings from Last 3 Encounters:  04/16/19 (!) 128/58  04/10/19 101/63  04/03/19 101/65    He was last seen for hypertension 3 weeks ago.  BP at that visit was 97/58. Management since that visit includes reducing Benazepril/ HCTZ to 1/2 tablet. He reports good compliance with treatment. Patient believes he is no longer taking Benazepril. He is not having side effects.  He is not exercising. He is not adherent to low salt diet.   Outside blood pressures are not checked. He is experiencing none.  Patient denies chest pain, chest pressure/discomfort, claudication, dyspnea, exertional chest pressure/discomfort, fatigue, irregular heart beat, lower extremity edema, near-syncope, orthopnea, palpitations, paroxysmal nocturnal dyspnea, syncope and tachypnea.   Cardiovascular risk factors include advanced age (older than 42 for men, 72 for women) and male gender.  Use of agents associated with hypertension: none.     Weight trend: fluctuating a bit Wt Readings from Last 3 Encounters:  04/16/19 239 lb (108.4 kg)  04/10/19 238 lb (108 kg)  04/03/19 235 lb 14.4 oz (107 kg)    Current diet: well balanced  ------------------------------------------------------------------------  Follow up for DVT:  The patient was last seen for this 3 weeks ago. Changes made at last visit include advising patient to start Eliquis.  He reports good compliance with treatment. He feels that condition is stable. He is not having side effects.   ------------------------------------------------------------------------------------ He is having urinary frequency and urgency. Was started on doxycyline last week by Dr. Grayland Ormond. Urine was cultured with results as follows: Culture  Abnormal  70,000 COLONIES/mL ESCHERICHIA COLI  50,000 COLONIES/mL ENTEROCOCCUS FAECALIS  ORGANISM 1 Confirmed Extended Spectrum Beta-Lactamase Producer (ESBL). In bloodstream infections from ESBL organisms, carbapenems are preferred over piperacillin/tazobactam. They are shown to have a lower risk of mortality.    Report Status 04/13/2019 FINAL   Organism ID, Bacteria ESCHERICHIA COLIAbnormal    Organism ID, Bacteria ENTEROCOCCUS FAECALISAbnormal    Resulting Agency [facility]  Susceptibility   Escherichia coli Enterococcus faecalis    MIC MIC    AMPICILLIN >=32 RESIST... Resistant <=2 SENSITIVE  Sensitive    AMPICILLIN/SULBACTAM 8 SENSITIVE  Sensitive      CEFAZOLIN >=64 RESIST... Resistant      CEFTRIAXONE RESISTANT  Resistant      CIPROFLOXACIN >=4 RESISTANT  Resistant      Extended ESBL POSITIVE  Resistant      GENTAMICIN <=1 SENSITIVE  Sensitive      IMIPENEM <=0.25 SENS... Sensitive      LEVOFLOXACIN   >=8 RESISTANT  Resistant    NITROFURANTOIN 64 INTERMED... Intermediate <=16 SENSIT... Sensitive    PIP/TAZO <=4 SENSITIVE  Sensitive      TRIMETH/SULFA >=320 RESIS... Resistant      VANCOMYCIN   1 SENSITIVE  Sensitive           Susceptibility Comments  Escherichia coli  70,000 COLONIES/mL ESCHERICHIA COLI  Enterococcus faecalis  50,000 COLONIES/mL ENTEROCOCCUS FAECALIS      Specimen Collected: 04/10/19 11:10 Last Resulted: 04/13/19 08:55        Allergies  Allergen Reactions  . Augmentin [Amoxicillin-Pot Clavulanate]     Tongue swelling 2 days after taking  . Ciprofloxacin Diarrhea  . Lisinopril Other (  See Comments)    Unknown   . Penicillins Swelling    unknwon reaction.  tolerates amoxicillin Has patient had a PCN reaction causing immediate rash, facial/tongue/throat swelling, SOB or lightheadedness with hypotension: No Has patient had a PCN reaction causing severe rash involving mucus membranes or skin necrosis: No Has patient had a PCN reaction that  required hospitalization: No Has patient had a PCN reaction occurring within the last 10 years: Yes If all of the above answers are "NO", then may proceed with Cephalosporin use.   . Bactrim [Sulfamethoxazole-Trimethoprim] Rash  . Sulfa Antibiotics Itching and Rash     Current Outpatient Medications:  .  ALPRAZolam (XANAX) 0.5 MG tablet, Take 1 tablet (0.5 mg total) by mouth at bedtime as needed for anxiety., Disp: 30 tablet, Rfl: 1 .  apixaban (ELIQUIS) 5 MG TABS tablet, Take 1 tablet (5 mg total) by mouth 2 (two) times daily., Disp: 60 tablet, Rfl: 3 .  atorvastatin (LIPITOR) 10 MG tablet, Take 1 tablet (10 mg total) by mouth daily., Disp: 30 tablet, Rfl: 11 .  Cholecalciferol (VITAMIN D3) 1000 units CAPS, Take 1,000 Units by mouth daily., Disp: , Rfl:  .  doxycycline (VIBRA-TABS) 100 MG tablet, Take 1 tablet (100 mg total) by mouth 2 (two) times daily., Disp: 14 tablet, Rfl: 0 .  famotidine (PEPCID) 20 MG tablet, Take 1 tablet (20 mg total) by mouth daily., Disp: 180 tablet, Rfl: 3 .  finasteride (PROSCAR) 5 MG tablet, Take 1 tablet (5 mg total) by mouth daily., Disp: 90 tablet, Rfl: 3 .  gabapentin (NEURONTIN) 300 MG capsule, Take 1 capsule (300 mg total) by mouth at bedtime. Do not take if you take temazepam, Disp: 30 capsule, Rfl: 6 .  hydrochlorothiazide (HYDRODIURIL) 25 MG tablet, Take 0.5 tablets (12.5 mg total) by mouth daily., Disp: , Rfl:  .  isosorbide dinitrate (ISORDIL) 30 MG tablet, Take 30 mg by mouth daily., Disp: , Rfl:  .  Magnesium 250 MG TABS, Take 250 mg by mouth daily., Disp: , Rfl:  .  metoprolol succinate (TOPROL-XL) 25 MG 24 hr tablet, Take 1 tablet (25 mg total) by mouth daily., Disp: 90 tablet, Rfl: 4 .  mirabegron ER (MYRBETRIQ) 25 MG TB24 tablet, Take 1 tablet (25 mg total) by mouth daily., Disp: 30 tablet, Rfl: 5 .  Multiple Vitamins-Minerals (VISION FORMULA 2 PO), Take by mouth daily., Disp: , Rfl:  .  naproxen sodium (ALEVE) 220 MG tablet, Take 220 mg by  mouth daily as needed., Disp: , Rfl:  .  ondansetron (ZOFRAN) 8 MG tablet, Take 1 tablet (8 mg total) by mouth 2 (two) times daily as needed for nausea or vomiting., Disp: 20 tablet, Rfl: 3 .  polyethylene glycol powder (CLEARLAX) 17 GM/SCOOP powder, Take 1 Container by mouth daily., Disp: , Rfl:  .  prochlorperazine (COMPAZINE) 10 MG tablet, Take 1 tablet (10 mg total) by mouth every 6 (six) hours as needed for nausea or vomiting., Disp: 30 tablet, Rfl: 3 .  psyllium (METAMUCIL) 58.6 % packet, Take 1 packet by mouth daily., Disp: , Rfl:  .  traMADol (ULTRAM) 50 MG tablet, Take by mouth every 6 (six) hours as needed. Patient is Unsure of dose., Disp: , Rfl:  .  zolpidem (AMBIEN) 10 MG tablet, Take 1 tablet (10 mg total) by mouth at bedtime as needed for sleep., Disp: 30 tablet, Rfl: 0  Review of Systems  Constitutional: Negative for appetite change, chills and fever.  Respiratory: Negative for chest tightness,  shortness of breath and wheezing.   Cardiovascular: Negative for chest pain and palpitations.  Gastrointestinal: Negative for abdominal pain, nausea and vomiting.  Endocrine: Positive for polyuria.  Genitourinary: Positive for dysuria (burning) and frequency.    Social History   Tobacco Use  . Smoking status: Former Smoker    Packs/day: 1.50    Years: 12.00    Pack years: 18.00    Types: Cigarettes    Quit date: 08/24/1971    Years since quitting: 47.6  . Smokeless tobacco: Never Used  Substance Use Topics  . Alcohol use: No    Alcohol/week: 0.0 standard drinks      Objective:   BP (!) 128/58 (BP Location: Left Arm, Patient Position: Sitting, Cuff Size: Large)   Pulse 98   Temp (!) 96.9 F (36.1 C) (Temporal)   Resp 20   Wt 239 lb (108.4 kg)   SpO2 98%   BMI 31.53 kg/m  Vitals:   04/16/19 1557  BP: (!) 128/58  Pulse: 98  Resp: 20  Temp: (!) 96.9 F (36.1 C)  TempSrc: Temporal  SpO2: 98%  Weight: 239 lb (108.4 kg)     Physical Exam   General Appearance:     Alert, cooperative, no distress  Eyes:    PERRL, conjunctiva/corneas clear, EOM's intact       Lungs:     Clear to auscultation bilaterally, respirations unlabored  Heart:    Normal heart rate. Normal rhythm. No murmurs, rubs, or gallops.   MS:   All extremities are intact.   Neurologic:   Awake, alert, oriented x 3. No apparent focal neurological           defect.        Results for orders placed or performed in visit on 04/16/19  POCT Urinalysis Dipstick  Result Value Ref Range   Color, UA yellow    Clarity, UA clear    Glucose, UA Negative Negative   Bilirubin, UA negative    Ketones, UA negative    Spec Grav, UA 1.015 1.010 - 1.025   Blood, UA Moderate (non-hemolyzed)    pH, UA 6.0 5.0 - 8.0   Protein, UA Positive (A) Negative   Urobilinogen, UA 0.2 0.2 or 1.0 E.U./dL   Nitrite, UA negative    Leukocytes, UA Moderate (2+) (A) Negative   Appearance     Odor         Assessment & Plan    1. Recurrent UTI   2. Cystitis Resistant to most oral antibiotic.  - fosfomycin (MONUROL) 3 g PACK; Take 3 g by mouth once for 1 dose.  Dispense: 3 g; Refill: 0 Return to check u/a in 4 days. Consider ID referral.  3. DVT of deep femoral vein, left (HCC) Continue Eliquis   - HYDROcodone-acetaminophen (NORCO) 7.5-325 MG tablet; Take 1 tablet by mouth every 6 (six) hours as needed for up to 5 days for moderate pain.  Dispense: 20 tablet; Refill: 0     Lelon Huh, MD  [facility]

## 2019-04-16 NOTE — Patient Instructions (Signed)
.   Please review the attached list of medications and notify my office if there are any errors.   . Please bring all of your medications to every appointment so we can make sure that our medication list is the same as yours.   . It is especially important to get the annual flu vaccine this year. If you haven't had it already, please go to your pharmacy or call the office as soon as possible to schedule you flu shot.  

## 2019-04-17 ENCOUNTER — Other Ambulatory Visit: Payer: Self-pay

## 2019-04-17 ENCOUNTER — Other Ambulatory Visit: Payer: Self-pay | Admitting: *Deleted

## 2019-04-17 ENCOUNTER — Inpatient Hospital Stay: Payer: PPO

## 2019-04-17 ENCOUNTER — Inpatient Hospital Stay (HOSPITAL_BASED_OUTPATIENT_CLINIC_OR_DEPARTMENT_OTHER): Payer: PPO | Admitting: Oncology

## 2019-04-17 ENCOUNTER — Ambulatory Visit
Admission: RE | Admit: 2019-04-17 | Discharge: 2019-04-17 | Disposition: A | Discharge status: Home / Self Care | Payer: PPO | Source: Ambulatory Visit | Attending: Radiation Oncology | Admitting: Radiation Oncology

## 2019-04-17 VITALS — BP 111/70 | HR 108 | Temp 99.0°F | Resp 18 | Wt 236.3 lb

## 2019-04-17 VITALS — HR 97

## 2019-04-17 DIAGNOSIS — C689 Malignant neoplasm of urinary organ, unspecified: Secondary | ICD-10-CM

## 2019-04-17 DIAGNOSIS — R300 Dysuria: Secondary | ICD-10-CM

## 2019-04-17 DIAGNOSIS — R339 Retention of urine, unspecified: Secondary | ICD-10-CM

## 2019-04-17 DIAGNOSIS — Z510 Encounter for antineoplastic radiation therapy: Secondary | ICD-10-CM | POA: Diagnosis not present

## 2019-04-17 DIAGNOSIS — Z5111 Encounter for antineoplastic chemotherapy: Secondary | ICD-10-CM | POA: Diagnosis not present

## 2019-04-17 LAB — URINALYSIS, COMPLETE (UACMP) WITH MICROSCOPIC
Bacteria, UA: NONE SEEN
Bilirubin Urine: NEGATIVE
Glucose, UA: NEGATIVE mg/dL
Ketones, ur: NEGATIVE mg/dL
Nitrite: NEGATIVE
Protein, ur: 30 mg/dL — AB
Specific Gravity, Urine: 1.013 (ref 1.005–1.030)
Squamous Epithelial / HPF: NONE SEEN (ref 0–5)
WBC, UA: >50 WBC/hpf — ABNORMAL HIGH (ref 0–5)
pH: 6 (ref 5.0–8.0)

## 2019-04-17 LAB — BASIC METABOLIC PANEL
Anion gap: 11 (ref 5–15)
BUN: 32 mg/dL — ABNORMAL HIGH (ref 8–23)
CO2: 26 mmol/L (ref 22–32)
Calcium: 8.9 mg/dL (ref 8.9–10.3)
Chloride: 103 mmol/L (ref 98–111)
Creatinine, Ser: 1.68 mg/dL — ABNORMAL HIGH (ref 0.61–1.24)
GFR calc Af Amer: 42 mL/min — ABNORMAL LOW (ref >60)
GFR calc non Af Amer: 36 mL/min — ABNORMAL LOW (ref >60)
Glucose, Bld: 113 mg/dL — ABNORMAL HIGH (ref 70–99)
Potassium: 3.9 mmol/L (ref 3.5–5.1)
Sodium: 140 mmol/L (ref 135–145)

## 2019-04-17 LAB — CBC WITH DIFFERENTIAL/PLATELET
Abs Immature Granulocytes: 0.03 10*3/uL (ref 0.00–0.07)
Basophils Absolute: 0 10*3/uL (ref 0.0–0.1)
Basophils Relative: 0 %
Eosinophils Absolute: 0.4 10*3/uL (ref 0.0–0.5)
Eosinophils Relative: 6 %
HCT: 28.2 % — ABNORMAL LOW (ref 39.0–52.0)
Hemoglobin: 9 g/dL — ABNORMAL LOW (ref 13.0–17.0)
Immature Granulocytes: 1 %
Lymphocytes Relative: 5 %
Lymphs Abs: 0.3 10*3/uL — ABNORMAL LOW (ref 0.7–4.0)
MCH: 27.3 pg (ref 26.0–34.0)
MCHC: 31.9 g/dL (ref 30.0–36.0)
MCV: 85.5 fL (ref 80.0–100.0)
Monocytes Absolute: 0.7 10*3/uL (ref 0.1–1.0)
Monocytes Relative: 11 %
Neutro Abs: 4.7 10*3/uL (ref 1.7–7.7)
Neutrophils Relative %: 77 %
Platelets: 200 10*3/uL (ref 150–400)
RBC: 3.3 MIL/uL — ABNORMAL LOW (ref 4.22–5.81)
RDW: 15.1 % (ref 11.5–15.5)
WBC: 6.1 10*3/uL (ref 4.0–10.5)
nRBC: 0 % (ref 0.0–0.2)

## 2019-04-17 MED ORDER — POTASSIUM CHLORIDE 2 MEQ/ML IV SOLN
Freq: Once | INTRAVENOUS | Status: AC
  Administered 2019-04-17: 10:00:00 via INTRAVENOUS
  Filled 2019-04-17: qty 1000

## 2019-04-17 MED ORDER — SODIUM CHLORIDE 0.9 % IV SOLN
35.0000 mg/m2 | Freq: Once | INTRAVENOUS | Status: AC
  Administered 2019-04-17: 85 mg via INTRAVENOUS
  Filled 2019-04-17: qty 85

## 2019-04-17 MED ORDER — PALONOSETRON HCL INJECTION 0.25 MG/5ML
0.2500 mg | Freq: Once | INTRAVENOUS | Status: AC
  Administered 2019-04-17: 13:00:00 0.25 mg via INTRAVENOUS
  Filled 2019-04-17: qty 5

## 2019-04-17 MED ORDER — SODIUM CHLORIDE 0.9 % IV SOLN
Freq: Once | INTRAVENOUS | Status: AC
  Administered 2019-04-17: 10:00:00 via INTRAVENOUS
  Filled 2019-04-17: qty 250

## 2019-04-17 MED ORDER — SODIUM CHLORIDE 0.9 % IV SOLN
Freq: Once | INTRAVENOUS | Status: AC
  Administered 2019-04-17: 13:00:00 via INTRAVENOUS
  Filled 2019-04-17: qty 5

## 2019-04-17 NOTE — Progress Notes (Signed)
Patient here today for follow up regarding urothelial carcinoma. Patient reports lower back pain, was seen by pcp and being treated for UTI.

## 2019-04-18 ENCOUNTER — Other Ambulatory Visit: Payer: Self-pay

## 2019-04-18 ENCOUNTER — Telehealth (INDEPENDENT_AMBULATORY_CARE_PROVIDER_SITE_OTHER): Payer: Self-pay | Admitting: Vascular Surgery

## 2019-04-18 ENCOUNTER — Ambulatory Visit
Admission: RE | Admit: 2019-04-18 | Discharge: 2019-04-18 | Disposition: A | Discharge status: Home / Self Care | Payer: PPO | Source: Ambulatory Visit | Attending: Radiation Oncology | Admitting: Radiation Oncology

## 2019-04-18 ENCOUNTER — Encounter: Payer: Self-pay | Admitting: Urology

## 2019-04-18 ENCOUNTER — Ambulatory Visit: Payer: PPO | Admitting: Urology

## 2019-04-18 ENCOUNTER — Ambulatory Visit: Payer: PPO

## 2019-04-18 VITALS — BP 158/78 | HR 94 | Ht 73.0 in | Wt 241.0 lb

## 2019-04-18 DIAGNOSIS — R339 Retention of urine, unspecified: Secondary | ICD-10-CM | POA: Diagnosis not present

## 2019-04-18 DIAGNOSIS — Z510 Encounter for antineoplastic radiation therapy: Secondary | ICD-10-CM | POA: Diagnosis not present

## 2019-04-18 DIAGNOSIS — R350 Frequency of micturition: Secondary | ICD-10-CM

## 2019-04-18 DIAGNOSIS — C679 Malignant neoplasm of bladder, unspecified: Secondary | ICD-10-CM | POA: Diagnosis not present

## 2019-04-18 DIAGNOSIS — C689 Malignant neoplasm of urinary organ, unspecified: Secondary | ICD-10-CM | POA: Diagnosis not present

## 2019-04-18 DIAGNOSIS — C7989 Secondary malignant neoplasm of other specified sites: Secondary | ICD-10-CM | POA: Diagnosis not present

## 2019-04-18 DIAGNOSIS — N3020 Other chronic cystitis without hematuria: Secondary | ICD-10-CM

## 2019-04-18 DIAGNOSIS — N1330 Unspecified hydronephrosis: Secondary | ICD-10-CM

## 2019-04-18 LAB — URINE CULTURE: Culture: <10000 — AB

## 2019-04-18 NOTE — Telephone Encounter (Signed)
It is noted

## 2019-04-18 NOTE — Progress Notes (Signed)
04/18/2019 3:45 PM   Bryan Nelson 06-May-1932 [id number]  Referring provider: [name], MD [address] [place],  [place] [address]  Chief Complaint  Patient presents with  . Follow-up    HPI: 85 year old male with locally advanced bladder cancer with a large pelvic mass returns the office for follow-up.  Notably, he has a personal history of bladder diverticulum, incomplete bladder emptying.  Unfortunately, he developed bladder cancer presumably within the diverticulum which developed into a 4 cm pelvic mass, confirmed by biopsy.  He is now under the care of Dr. Grayland Ormond.  He received a dose of cisplatin but further cycles have been delayed due to elevated creatinine. He recently started radiation for local treatment of his disease.  In the interim, in light of rising creatinine he is found to have left hydronephrosis.  He underwent ureteral stent placement on 03/17/2019 for optimal urinary drainage in order to optimize renal function.  His baseline creatinine year ago was partially 1.2.  With the development of ureteral obstruction from his malignancy, it rose to approximately 2.  No significant recovery of his renal function following stent placement.  Overall, he has persistent severe baseline voiding issues.  He has chronic dysuria, incomplete bladder emptying, frequency amongst others.  This is unchanged from years ago.  He was previously managed by [facility] but has not been doing this lately.  He supposed to be self cathing 3 times daily.  He is currently on Myrbetriq 25 mg which is been helpful.  PMH: Past Medical History:  Diagnosis Date  . Arteriosclerosis of coronary artery 08/26/2011   Overview:      a.  1999 PCI of the mid LAD with stent.      b.  2002 [facility] [place]: EF 56%. RCA-distal 25/25%. Left main-50% ostial.  Left circumflex-25% OM2.  LAD-25% proximal.  75% mid.  25/25% distal. 75% D1.      c.  07/2011 PCI of RCA with DES.[place]   . Bleeding  gastrointestinal 08/26/2011   Overview:      a.  Chronic abdominal pain, present improving.      b.  Duodenitis and gastritis by EGD in 2000.      c.  Pylori in 2000, treated.      d.  Gastroesophageal reflux disease.      e.  Diverticular disease.    . BP (high blood pressure) 08/26/2011  . Closed dislocation of right ankle 09/10/2016   Successfully reduced in ER 09/10/2016  . Diffuse large B-cell lymphoma ([place]) 2015   chemo tx's  . GERD (gastroesophageal reflux disease)   . Heart disease   . Helicobacter pylori infection 06/18/2015   by EGD RX 08/18/1999   . History of adenomatous polyp of colon 06/18/2015  . HOH (hard of hearing)    Bilateral Hearing  Aids  . Malleolar fracture (Right) 09/10/2016    Surgical History: Past Surgical History:  Procedure Laterality Date  . ANGIOPLASTY    . CATARACT EXTRACTION    . COLONOSCOPY  2016  . CORONARY ANGIOPLASTY    . CYSTOSCOPY WITH STENT PLACEMENT Left 03/17/2019   Procedure: CYSTOSCOPY WITH STENT PLACEMENT;  Surgeon: Festus Aloe, MD;  Location: [facility];  Service: Urology;  Laterality: Left;  . EYE SURGERY Bilateral    Cataract Extraction with IOL  . GREEN LIGHT LASER TURP (TRANSURETHRAL RESECTION OF PROSTATE N/A 04/16/2015   Procedure: GREEN LIGHT LASER TURP (TRANSURETHRAL RESECTION OF PROSTATE WITH BLADDER BIOPSY;  Surgeon: Janice Coffin  Elnoria Howard, MD;  Location: [facility];  Service: Urology;  Laterality: N/A;  . heart stent placement     1998, 2000, 2012  . PERIPHERAL VASCULAR THROMBECTOMY Left 03/22/2019   Procedure: PERIPHERAL VASCULAR THROMBECTOMY and possible IVC filter;  Surgeon: Algernon Huxley, MD;  Location: [facility];  Service: Cardiovascular;  Laterality: Left;  . TRANSURETHRAL RESECTION OF PROSTATE N/A 03/02/2017   Procedure: TRANSURETHRAL RESECTION OF THE PROSTATE (TURP);  Surgeon: Hollice Espy, MD;  Location: [facility];  Service: Urology;  Laterality: N/A;    Home Medications:  Allergies as of 04/18/2019      Reactions    Augmentin [amoxicillin-pot Clavulanate]    Tongue swelling 2 days after taking   Ciprofloxacin Diarrhea   Lisinopril Other (See Comments)   Unknown    Penicillins Swelling   unknwon reaction.  tolerates amoxicillin Has patient had a PCN reaction causing immediate rash, facial/tongue/throat swelling, SOB or lightheadedness with hypotension: No Has patient had a PCN reaction causing severe rash involving mucus membranes or skin necrosis: No Has patient had a PCN reaction that required hospitalization: No Has patient had a PCN reaction occurring within the last 10 years: Yes If all of the above answers are "NO", then may proceed with Cephalosporin use.   Bactrim [sulfamethoxazole-trimethoprim] Rash   Sulfa Antibiotics Itching, Rash      Medication List       Accurate as of April 18, 2019 11:59 PM. If you have any questions, ask your nurse or doctor.        ALPRAZolam 0.5 MG tablet Commonly known as: XANAX Take 1 tablet (0.5 mg total) by mouth at bedtime as needed for anxiety.   apixaban 5 MG Tabs tablet Commonly known as: ELIQUIS Take 1 tablet (5 mg total) by mouth 2 (two) times daily.   atorvastatin 10 MG tablet Commonly known as: Lipitor Take 1 tablet (10 mg total) by mouth daily.   ClearLax 17 GM/SCOOP powder Generic drug: polyethylene glycol powder Take 1 Container by mouth daily.   doxycycline 100 MG tablet Commonly known as: VIBRA-TABS Take 1 tablet (100 mg total) by mouth 2 (two) times daily.   famotidine 20 MG tablet Commonly known as: Pepcid Take 1 tablet (20 mg total) by mouth daily.   finasteride 5 MG tablet Commonly known as: Proscar Take 1 tablet (5 mg total) by mouth daily.   fosfomycin 3 g Pack Commonly known as: MONUROL Take 3 g by mouth once for 1 dose.   gabapentin 300 MG capsule Commonly known as: NEURONTIN Take 1 capsule (300 mg total) by mouth at bedtime. Do not take if you take temazepam   hydrochlorothiazide 25 MG tablet Commonly known  as: HYDRODIURIL Take 0.5 tablets (12.5 mg total) by mouth daily.   HYDROcodone-acetaminophen 7.5-325 MG tablet Commonly known as: NORCO Take 1 tablet by mouth every 6 (six) hours as needed for up to 5 days for moderate pain.   isosorbide dinitrate 30 MG tablet Commonly known as: ISORDIL Take 30 mg by mouth daily.   Magnesium 250 MG Tabs Take 250 mg by mouth daily.   metoprolol succinate 25 MG 24 hr tablet Commonly known as: TOPROL-XL Take 1 tablet (25 mg total) by mouth daily.   mirabegron ER 25 MG Tb24 tablet Commonly known as: MYRBETRIQ Take 1 tablet (25 mg total) by mouth daily.   naproxen sodium 220 MG tablet Commonly known as: ALEVE Take 220 mg by mouth daily as needed.   ondansetron 8 MG tablet Commonly  known as: ZOFRAN Take 1 tablet (8 mg total) by mouth 2 (two) times daily as needed for nausea or vomiting.   prochlorperazine 10 MG tablet Commonly known as: COMPAZINE Take 1 tablet (10 mg total) by mouth every 6 (six) hours as needed for nausea or vomiting.   psyllium 58.6 % packet Commonly known as: METAMUCIL Take 1 packet by mouth daily.   traMADol 50 MG tablet Commonly known as: ULTRAM Take by mouth every 6 (six) hours as needed. Patient is Unsure of dose.   VISION FORMULA 2 PO Take by mouth daily.   Vitamin D3 25 MCG (1000 UT) Caps Take 1,000 Units by mouth daily.   zolpidem 10 MG tablet Commonly known as: AMBIEN Take 1 tablet (10 mg total) by mouth at bedtime as needed for sleep.       Allergies:  Allergies  Allergen Reactions  . Augmentin [Amoxicillin-Pot Clavulanate]     Tongue swelling 2 days after taking  . Ciprofloxacin Diarrhea  . Lisinopril Other (See Comments)    Unknown   . Penicillins Swelling    unknwon reaction.  tolerates amoxicillin Has patient had a PCN reaction causing immediate rash, facial/tongue/throat swelling, SOB or lightheadedness with hypotension: No Has patient had a PCN reaction causing severe rash involving mucus  membranes or skin necrosis: No Has patient had a PCN reaction that required hospitalization: No Has patient had a PCN reaction occurring within the last 10 years: Yes If all of the above answers are "NO", then may proceed with Cephalosporin use.   . Bactrim [Sulfamethoxazole-Trimethoprim] Rash  . Sulfa Antibiotics Itching and Rash    Family History: Family History  Problem Relation Age of Onset  . Osteoporosis Mother   . Alzheimer's disease Father   . Kidney disease Neg Hx   . Prostate cancer Neg Hx   . Bladder Cancer Neg Hx   . Kidney cancer Neg Hx     Social History:  reports that he quit smoking about 47 years ago. His smoking use included cigarettes. He has a 18.00 pack-year smoking history. He has never used smokeless tobacco. He reports that he does not drink alcohol or use drugs.  ROS: UROLOGY Frequent Urination?: Yes Hard to postpone urination?: No Burning/pain with urination?: Yes Get up at night to urinate?: Yes Leakage of urine?: No Urine stream starts and stops?: No Trouble starting stream?: No Do you have to strain to urinate?: No Blood in urine?: No Urinary tract infection?: Yes Sexually transmitted disease?: No Injury to kidneys or bladder?: No Painful intercourse?: No Weak stream?: No Erection problems?: No Penile pain?: No  Gastrointestinal Nausea?: No Vomiting?: No Indigestion/heartburn?: No Diarrhea?: No Constipation?: No  Constitutional Fever: No Night sweats?: No Weight loss?: No Fatigue?: No  Skin Skin rash/lesions?: No Itching?: No  Eyes Blurred vision?: No Double vision?: No  Ears/Nose/Throat Sore throat?: No Sinus problems?: No  Hematologic/Lymphatic Swollen glands?: No Easy bruising?: No  Cardiovascular Leg swelling?: No Chest pain?: No  Respiratory Cough?: No Shortness of breath?: No  Endocrine Excessive thirst?: No  Musculoskeletal Back pain?: No Joint pain?: No  Neurological Headaches?: No Dizziness?:  No  Psychologic Depression?: No Anxiety?: No  Physical Exam: BP (!) 158/78   Pulse 94   Ht 6\' 1"  (1.854 m)   Wt 241 lb (109.3 kg)   BMI 31.80 kg/m   Constitutional:  Alert and oriented, No acute distress. HEENT: [place] AT, moist mucus membranes.  Trachea midline, no masses. Cardiovascular: No clubbing, cyanosis, or edema. Respiratory:  Normal respiratory effort, no increased work of breathing. Skin: No rashes, bruises or suspicious lesions. Neurologic: Grossly intact, no focal deficits, moving all 4 extremities. Psychiatric: Normal mood and affect.  Laboratory Data: Most recent creatinine 1.68, slightly improved yesterday.  Urinalysis Results for orders placed or performed in visit on 04/18/19  Microscopic Examination   URINE  Result Value Ref Range   WBC, UA 6-10 (A) 0 - 5 /hpf   RBC 3-10 (A) 0 - 2 /hpf   Epithelial Cells (non renal) 0-10 0 - 10 /hpf   Renal Epithel, UA 0-10 (A) None seen /hpf   Bacteria, UA Few None seen/Few  Urinalysis, Complete  Result Value Ref Range   Specific Gravity, UA 1.020 1.005 - 1.030   pH, UA 6.0 5.0 - 7.5   Color, UA Yellow Yellow   Appearance Ur Cloudy (A) Clear   Leukocytes,UA 1+ (A) Negative   Protein,UA 1+ (A) Negative/Trace   Glucose, UA Negative Negative   Ketones, UA Negative Negative   RBC, UA 1+ (A) Negative   Bilirubin, UA Negative Negative   Urobilinogen, Ur 0.2 0.2 - 1.0 mg/dL   Nitrite, UA Negative Negative   Microscopic Examination See below:     Assessment & Plan:    1. Bladder cancer metastasized to pelvic region [facility]) Pelvic mass, biopsy consistent with mass metastatic high-grade urothelial carcinoma, locally advanced with pelvic mass presumably originated in bladder diverticulum  Only able to tolerate 1 dose of cis-platinum, reconsidering additional doses  Radiation for local control/symptomatic relief.  2. Hydronephrosis, unspecified hydronephrosis type Status post left ureteral stent placement  Some mild  improvement in renal function but not dramatic  Ultimately, he will either need this removed or exchanged.  Will reassess in a few months after repeat follow-up imaging to see if his tumor has responded to treatment.  If not, we will plan on extubating a stent. - Urinalysis, Complete  3. Urinary retention Advised to resume CIC, Supplies as needed  4. Urinary frequency Continue Myrbetriq 25 mg  5. Chronic cystitis Chronic irritative voiding symptoms at baseline, only treat for signs of systemic infection   Return in about 8 weeks (around 06/13/2019) for With possible stent removal, MD follow up.  Hollice Espy, MD  [facility] [address], [place], [place] [address] [phone]

## 2019-04-18 NOTE — Telephone Encounter (Signed)
Lige with [facility] [phone]) Called stating that patient missed his schedule PT for this morning at 10am. No answer at the door or on the phone. AS, CMA

## 2019-04-19 ENCOUNTER — Ambulatory Visit
Admission: RE | Admit: 2019-04-19 | Discharge: 2019-04-19 | Disposition: A | Discharge status: Home / Self Care | Payer: PPO | Source: Ambulatory Visit | Attending: Radiation Oncology | Admitting: Radiation Oncology

## 2019-04-19 ENCOUNTER — Other Ambulatory Visit: Payer: Self-pay

## 2019-04-19 DIAGNOSIS — C689 Malignant neoplasm of urinary organ, unspecified: Secondary | ICD-10-CM | POA: Diagnosis not present

## 2019-04-19 DIAGNOSIS — Z510 Encounter for antineoplastic radiation therapy: Secondary | ICD-10-CM | POA: Diagnosis not present

## 2019-04-19 LAB — URINALYSIS, COMPLETE
Bilirubin, UA: NEGATIVE
Glucose, UA: NEGATIVE
Ketones, UA: NEGATIVE
Nitrite, UA: NEGATIVE
Specific Gravity, UA: 1.02 (ref 1.005–1.030)
Urobilinogen, Ur: 0.2 mg/dL (ref 0.2–1.0)
pH, UA: 6 (ref 5.0–7.5)

## 2019-04-19 LAB — MICROSCOPIC EXAMINATION

## 2019-04-20 ENCOUNTER — Ambulatory Visit
Admission: RE | Admit: 2019-04-20 | Discharge: 2019-04-20 | Disposition: A | Discharge status: Home / Self Care | Payer: PPO | Source: Ambulatory Visit | Attending: Radiation Oncology | Admitting: Radiation Oncology

## 2019-04-20 ENCOUNTER — Ambulatory Visit (INDEPENDENT_AMBULATORY_CARE_PROVIDER_SITE_OTHER): Payer: PPO | Admitting: Family Medicine

## 2019-04-20 ENCOUNTER — Other Ambulatory Visit: Payer: Self-pay

## 2019-04-20 DIAGNOSIS — N390 Urinary tract infection, site not specified: Secondary | ICD-10-CM | POA: Diagnosis not present

## 2019-04-20 DIAGNOSIS — N3090 Cystitis, unspecified without hematuria: Secondary | ICD-10-CM

## 2019-04-20 DIAGNOSIS — R200 Anesthesia of skin: Secondary | ICD-10-CM

## 2019-04-20 DIAGNOSIS — Z510 Encounter for antineoplastic radiation therapy: Secondary | ICD-10-CM | POA: Diagnosis not present

## 2019-04-20 DIAGNOSIS — R202 Paresthesia of skin: Secondary | ICD-10-CM

## 2019-04-20 DIAGNOSIS — C689 Malignant neoplasm of urinary organ, unspecified: Secondary | ICD-10-CM | POA: Diagnosis not present

## 2019-04-20 LAB — POCT URINALYSIS DIPSTICK
Bilirubin, UA: NEGATIVE
Glucose, UA: NEGATIVE
Ketones, UA: NEGATIVE
Nitrite, UA: POSITIVE
Protein, UA: POSITIVE — AB
Spec Grav, UA: 1.01 (ref 1.010–1.025)
Urobilinogen, UA: 0.2 E.U./dL
pH, UA: 6 (ref 5.0–8.0)

## 2019-04-20 MED ORDER — FOSFOMYCIN TROMETHAMINE 3 G PO PACK: 3.0000 g | PACK | Freq: Once | ORAL | 0 refills | Status: AC

## 2019-04-20 MED ORDER — GABAPENTIN 300 MG PO CAPS: 300.0000 mg | ORAL_CAPSULE | Freq: Every day | ORAL | 6 refills | Status: DC

## 2019-04-21 ENCOUNTER — Ambulatory Visit: Payer: PPO

## 2019-04-21 ENCOUNTER — Other Ambulatory Visit: Payer: Self-pay | Admitting: Oncology

## 2019-04-21 NOTE — Progress Notes (Signed)
[place]  [phone]  [phone]     Bryan Nelson DOB: 11/09/1931  MR#: [id number]  ATF#:[id number]  Patient Care Team: [name], MD as PCP - General (Family Medicine) Dingeldein, Remo Lipps, MD as Consulting Physician (Ophthalmology) Hollice Espy, MD as Consulting Physician (Urology) Yolonda Kida, MD as Consulting Physician (Cardiology) Laneta Simmers as Physician Assistant (Urology)   CHIEF COMPLAINT: History of diffuse large B-cell lymphoma, now with urothelial carcinoma.  INTERVAL HISTORY: Patient returns to clinic today for further evaluation and consideration of cycle 4 of weekly cisplatin.  He continues to have significant back pain and insomnia, but admits he is not taking his medications as prescribed. He continues to have a mild tremor, but no other neurologic complaints.  He denies any recent fevers or illnesses.  He has a fair appetite and denies weight loss.  He denies any chest pain, shortness of breath, cough, or hemoptysis.  He has no nausea, vomiting, or diarrhea.  He denies any melena or hematochezia.  Patient offers no further specific complaints today.  REVIEW OF SYSTEMS:   Review of Systems  Constitutional: Negative.  Negative for diaphoresis, fever, malaise/fatigue and weight loss.  Respiratory: Negative.  Negative for cough and shortness of breath.   Cardiovascular: Negative.  Negative for chest pain and leg swelling.  Gastrointestinal: Negative.  Negative for abdominal pain, blood in stool and melena.  Genitourinary: Positive for flank pain and frequency. Negative for dysuria, hematuria and urgency.  Musculoskeletal: Positive for back pain. Negative for joint pain.  Skin: Negative.  Negative for rash.  Neurological: Positive for tremors. Negative for dizziness, focal weakness, weakness and headaches.  Psychiatric/Behavioral: The patient has insomnia. The patient is not nervous/anxious.      As per HPI. Otherwise, a complete review of systems is negative.  ONCOLOGY HISTORY: Oncology History Overview Note   Diffuse Large B Cell Lymphoma: Patient was initially diagnosed in August 2015, pathology was noted to be anaplastic subtype.  He completed 6 cycles of R-CHOP chemotherapy in January 2016. PET scan January 2017 revealed no evidence of disease.  2.  Urothelial carcinoma: Confirmed by biopsy.  Patient's most recent CT scan on March 01, 2019 reviewed independently and reported as above with progressive left pelvic mass greater than 4 cm.  Patient appears to have no other evidence of disease.  Case discussed with urology.  Given  the fact that patient is symptomatic with pain and has localized disease, he will benefit from concurrent chemotherapy using weekly cisplatin 40 mg/m along with daily XRT.  Delay cycle 1 of cisplatin today secondary to elevated creatinine.  Continue daily XRT.  Return to clinic in 1 week for further evaluation and reconsideration of cycle 2.     DLBCL (diffuse large B cell lymphoma) ([place])  04/10/2014 Initial Diagnosis   DLBCL (diffuse large B cell lymphoma) ([place])     PAST MEDICAL HISTORY: Past Medical History:  Diagnosis Date   Arteriosclerosis of coronary artery 08/26/2011   Overview:      a.  1999 PCI of the mid LAD with stent.      b.  2002 Cath [place]: EF 56%. RCA-distal 25/25%. Left main-50% ostial.  Left circumflex-25% OM2.  LAD-25% proximal.  75% mid.  25/25% distal. 75% D1.      c.  07/2011 PCI of RCA with DES.[place]    Bleeding gastrointestinal 08/26/2011   Overview:      a.  Chronic abdominal pain, present improving.  b.  Duodenitis and gastritis by EGD in 2000.      c.  Pylori in 2000, treated.      d.  Gastroesophageal reflux disease.      e.  Diverticular disease.     BP (high blood pressure) 08/26/2011   Closed dislocation of right ankle 09/10/2016   Successfully reduced in ER 09/10/2016   Diffuse large B-cell lymphoma ([place]) 2015    chemo tx's   GERD (gastroesophageal reflux disease)    Heart disease    Helicobacter pylori infection 06/18/2015   by EGD RX 08/18/1999    History of adenomatous polyp of colon 06/18/2015   HOH (hard of hearing)    Bilateral Hearing  Aids   Malleolar fracture (Right) 09/10/2016    PAST SURGICAL HISTORY: Past Surgical History:  Procedure Laterality Date   ANGIOPLASTY     CATARACT EXTRACTION     COLONOSCOPY  2016   CORONARY ANGIOPLASTY     CYSTOSCOPY WITH STENT PLACEMENT Left 03/17/2019   Procedure: CYSTOSCOPY WITH STENT PLACEMENT;  Surgeon: Festus Aloe, MD;  Location: [facility] OR[facility];  Service: Urology;  Laterality: Left;   EYE SURGERY Bilateral    Cataract Extraction with IOL   GREEN LIGHT LASER TURP (TRANSURETHRAL RESECTION OF PROSTATE N/A 04/16/2015   Procedure: GREEN LIGHT LASER TURP (TRANSURETHRAL RESECTION OF PROSTATE WITH BLADDER BIOPSY;  Surgeon: Collier Flowers, MD;  Location: [facility];  Service: Urology;  Laterality: N/A;   heart stent placement     1998, 2000, 2012   PERIPHERAL VASCULAR THROMBECTOMY Left 03/22/2019   Procedure: PERIPHERAL VASCULAR THROMBECTOMY and possible IVC filter;  Surgeon: Algernon Huxley, MD;  Location: [facility];  Service: Cardiovascular;  Laterality: Left;   TRANSURETHRAL RESECTION OF PROSTATE N/A 03/02/2017   Procedure: TRANSURETHRAL RESECTION OF THE PROSTATE (TURP);  Surgeon: Hollice Espy, MD;  Location: [facility];  Service: Urology;  Laterality: N/A;    FAMILY HISTORY Family History  Problem Relation Age of Onset   Osteoporosis Mother    Alzheimer's disease Father    Kidney disease Neg Hx    Prostate cancer Neg Hx    Bladder Cancer Neg Hx    Kidney cancer Neg Hx    AVANCED DIRECTIVES:    HEALTH MAINTENANCE: Social History   Tobacco Use   Smoking status: Former Smoker    Packs/day: 1.50    Years: 12.00    Pack years: 18.00    Types: Cigarettes    Quit date: 08/24/1971    Years since quitting: 47.6    Smokeless tobacco: Never Used  Substance Use Topics   Alcohol use: No    Alcohol/week: 0.0 standard drinks   Drug use: No    Allergies  Allergen Reactions   Augmentin [Amoxicillin-Pot Clavulanate]     Tongue swelling 2 days after taking   Ciprofloxacin Diarrhea   Lisinopril Other (See Comments)    Unknown    Penicillins Swelling    unknwon reaction.  tolerates amoxicillin Has patient had a PCN reaction causing immediate rash, facial/tongue/throat swelling, SOB or lightheadedness with hypotension: No Has patient had a PCN reaction causing severe rash involving mucus membranes or skin necrosis: No Has patient had a PCN reaction that required hospitalization: No Has patient had a PCN reaction occurring within the last 10 years: Yes If all of the above answers are "NO", then may proceed with Cephalosporin use.    Bactrim [Sulfamethoxazole-Trimethoprim] Rash   Sulfa Antibiotics Itching and Rash    Current Outpatient Medications  Medication Sig Dispense Refill   acetaminophen-codeine (TYLENOL #3) 300-30 MG tablet Take by mouth every 4 (four) hours as needed for moderate pain.     ALPRAZolam (XANAX) 0.5 MG tablet Take 1 tablet (0.5 mg total) by mouth at bedtime as needed for anxiety. 30 tablet 1   apixaban (ELIQUIS) 5 MG TABS tablet Take 1 tablet (5 mg total) by mouth 2 (two) times daily. 60 tablet 3   atorvastatin (LIPITOR) 10 MG tablet Take 1 tablet (10 mg total) by mouth daily. 30 tablet 11   Cholecalciferol (VITAMIN D3) 1000 units CAPS Take 1,000 Units by mouth daily.     doxycycline (VIBRA-TABS) 100 MG tablet Take 1 tablet (100 mg total) by mouth 2 (two) times daily. 14 tablet 0   famotidine (PEPCID) 20 MG tablet Take 1 tablet (20 mg total) by mouth daily. 180 tablet 3   fentaNYL (DURAGESIC) 25 MCG/HR Place 1 patch onto the skin every 3 (three) days.     finasteride (PROSCAR) 5 MG tablet Take 1 tablet (5 mg total) by mouth daily. 90 tablet 3   gabapentin  (NEURONTIN) 300 MG capsule Take 1 capsule (300 mg total) by mouth at bedtime. Do not take if you take temazepam 30 capsule 6   hydrochlorothiazide (HYDRODIURIL) 25 MG tablet Take 0.5 tablets (12.5 mg total) by mouth daily.     isosorbide dinitrate (ISORDIL) 30 MG tablet Take 30 mg by mouth daily.     Magnesium 250 MG TABS Take 250 mg by mouth daily.     metoprolol succinate (TOPROL-XL) 25 MG 24 hr tablet Take 1 tablet (25 mg total) by mouth daily. 90 tablet 4   mirabegron ER (MYRBETRIQ) 25 MG TB24 tablet Take 1 tablet (25 mg total) by mouth daily. 30 tablet 5   Multiple Vitamins-Minerals (VISION FORMULA 2 PO) Take by mouth daily.     naproxen sodium (ALEVE) 220 MG tablet Take 220 mg by mouth daily as needed.     ondansetron (ZOFRAN) 8 MG tablet Take 1 tablet (8 mg total) by mouth 2 (two) times daily as needed for nausea or vomiting. 20 tablet 3   Oxycodone HCl 10 MG TABS Take by mouth every 4 (four) hours as needed.     oxyCODONE-acetaminophen (PERCOCET/ROXICET) 5-325 MG tablet Take by mouth every 8 (eight) hours as needed for severe pain.     polyethylene glycol powder (CLEARLAX) 17 GM/SCOOP powder Take 1 Container by mouth daily.     prochlorperazine (COMPAZINE) 10 MG tablet Take 1 tablet (10 mg total) by mouth every 6 (six) hours as needed for nausea or vomiting. 30 tablet 3   psyllium (METAMUCIL) 58.6 % packet Take 1 packet by mouth daily.     zolpidem (AMBIEN) 10 MG tablet Take 1 tablet (10 mg total) by mouth at bedtime as needed for sleep. 30 tablet 0   nitrofurantoin, macrocrystal-monohydrate, (MACROBID) 100 MG capsule Take 1 capsule (100 mg total) by mouth 2 (two) times daily. (Patient not taking: Reported on 04/24/2019) 20 capsule 0   traMADol (ULTRAM) 50 MG tablet Take by mouth every 6 (six) hours as needed. Patient is Unsure of dose.     No current facility-administered medications for this visit.    Facility-Administered Medications Ordered in Other Visits  Medication  Dose Route Frequency Provider Last Rate Last Dose   CISplatin (PLATINOL) 85 mg in sodium chloride 0.9 % 250 mL chemo infusion  35 mg/m2 (Treatment Plan Recorded) Intravenous Once Lloyd Huger, MD  fosaprepitant (EMEND) 150 mg, dexamethasone (DECADRON) 12 mg in sodium chloride 0.9 % 145 mL IVPB   Intravenous Once Grayland Ormond, Kathlene November, MD       palonosetron (ALOXI) injection 0.25 mg  0.25 mg Intravenous Once Lloyd Huger, MD        OBJECTIVE: BP 95/67    Pulse (!) 112    Temp (!) 95.9 F (35.5 C)    Resp 20    Wt 233 lb 4.8 oz (105.8 kg)    BMI 30.78 kg/m    Body mass index is 30.78 kg/m.    ECOG FS:0 - Asymptomatic  General: Well-developed, well-nourished, no acute distress. Eyes: Pink conjunctiva, anicteric sclera. HEENT: Normocephalic, moist mucous membranes. Lungs: Clear to auscultation bilaterally. Heart: Regular rate and rhythm. No rubs, murmurs, or gallops. Abdomen: Soft, nontender, nondistended. No organomegaly noted, normoactive bowel sounds. Musculoskeletal: No edema, cyanosis, or clubbing. Neuro: Alert, answering all questions appropriately. Cranial nerves grossly intact. Skin: No rashes or petechiae noted. Psych: Normal affect.   LAB RESULTS:  Appointment on 04/24/2019  Component Date Value Ref Range Status   Sodium 04/24/2019 134* 135 - 145 mmol/L Final   Potassium 04/24/2019 3.8  3.5 - 5.1 mmol/L Final   Chloride 04/24/2019 96* 98 - 111 mmol/L Final   CO2 04/24/2019 28  22 - 32 mmol/L Final   Glucose, Bld 04/24/2019 151* 70 - 99 mg/dL Final   BUN 04/24/2019 31* 8 - 23 mg/dL Final   Creatinine, Ser 04/24/2019 1.84* 0.61 - 1.24 mg/dL Final   Calcium 04/24/2019 9.0  8.9 - 10.3 mg/dL Final   GFR calc non Af Amer 04/24/2019 33* >60 mL/min Final   GFR calc Af Amer 04/24/2019 38* >60 mL/min Final   Anion gap 04/24/2019 10  5 - 15 Final   Performed at [facility], [address] [place], [place] [address]   WBC 04/24/2019 4.6  4.0  - 10.5 K/uL Final   RBC 04/24/2019 3.37* 4.22 - 5.81 MIL/uL Final   Hemoglobin 04/24/2019 9.3* 13.0 - 17.0 g/dL Final   HCT 04/24/2019 28.4* 39.0 - 52.0 % Final   MCV 04/24/2019 84.3  80.0 - 100.0 fL Final   MCH 04/24/2019 27.6  26.0 - 34.0 pg Final   MCHC 04/24/2019 32.7  30.0 - 36.0 g/dL Final   RDW 04/24/2019 15.1  11.5 - 15.5 % Final   Platelets 04/24/2019 194  150 - 400 K/uL Final   nRBC 04/24/2019 0.0  0.0 - 0.2 % Final   Neutrophils Relative % 04/24/2019 79  % Final   Neutro Abs 04/24/2019 3.7  1.7 - 7.7 K/uL Final   Lymphocytes Relative 04/24/2019 6  % Final   Lymphs Abs 04/24/2019 0.3* 0.7 - 4.0 K/uL Final   Monocytes Relative 04/24/2019 10  % Final   Monocytes Absolute 04/24/2019 0.5  0.1 - 1.0 K/uL Final   Eosinophils Relative 04/24/2019 3  % Final   Eosinophils Absolute 04/24/2019 0.2  0.0 - 0.5 K/uL Final   Basophils Relative 04/24/2019 1  % Final   Basophils Absolute 04/24/2019 0.0  0.0 - 0.1 K/uL Final   Immature Granulocytes 04/24/2019 1  % Final   Abs Immature Granulocytes 04/24/2019 0.04  0.00 - 0.07 K/uL Final   Performed at [facility], [address] [place], [place] [address]    STUDIES: No results found.   ASSESSMENT: History of diffuse large B-cell lymphoma.  PLAN:    1. Diffuse Large B Cell Lymphoma: Patient was initially  diagnosed in August 2015, pathology was noted to be anaplastic subtype.  He completed 6 cycles of R-CHOP chemotherapy in January 2016. PET scan January 2017 revealed no evidence of disease.  2.  Urothelial carcinoma: Confirmed by biopsy.  Patient's most recent CT scan on March 01, 2019 reviewed independently with progressive left pelvic mass greater than 4 cm.  Patient appears to have no other evidence of disease.  Case discussed with urology.  Given the fact that patient is symptomatic with pain and has localized disease, he will benefit from concurrent chemotherapy using weekly cisplatin 40 mg/m along with  daily XRT.  Proceed with cycle 4 of weekly cisplatin today. Continue with daily XRT completing on April 25, 2019.  Return to clinic in 3 weeks for routine evaluation.  Plan to restage with PET scan approximately 6 weeks after the conclusion of his XRT.   3.  Hydronephrosis: Patient has ureteral stent in place. 4.  Renal insufficiency: Creatinine is 1.84 today.  Proceed with treatment as above. 5.  Pain: Worse today.  Patient is not taking his medications as prescribed.  I had a lengthy discussion of which medications he should be taking and discontinued several from his med list.  Currently, he only has fentanyl patch and oxycodone.  Patient also had consultation with palliative care today for further evaluation. 6.  Anemia: Hemoglobin is decreased, but stable at 9.3. 7.  DVT: Appreciate vascular surgery input.  Patient will require Eliquis for minimum of 6 months until at least February 2021. 8.  UTI: Patient symptoms appear unchanged.  UA at primary care physician continue to be positive.  Continue doxycycline as prescribed. 9.  Tremors: Possibly medication induced, monitor. 10.  Constipation: Patient does not complain of this today.  Have recommended OTC MiraLAX and magnesium citrate. 11.  Insomnia: Multifactorial.  Continue Xanax as needed. 12.  Hypotension: Patient will receive an additional 1 L of IV fluids today.  Patient expressed understanding and was in agreement with this plan. He also understands that He can call clinic at any time with any questions, concerns, or complaints.    Lloyd Huger, MD   04/24/2019 12:37 PM

## 2019-04-23 ENCOUNTER — Ambulatory Visit
Admission: RE | Admit: 2019-04-23 | Discharge: 2019-04-23 | Disposition: A | Discharge status: Home / Self Care | Payer: PPO | Source: Ambulatory Visit | Attending: Radiation Oncology | Admitting: Radiation Oncology

## 2019-04-23 ENCOUNTER — Telehealth: Payer: Self-pay

## 2019-04-23 ENCOUNTER — Other Ambulatory Visit: Payer: Self-pay

## 2019-04-23 ENCOUNTER — Ambulatory Visit: Payer: PPO

## 2019-04-23 DIAGNOSIS — Z510 Encounter for antineoplastic radiation therapy: Secondary | ICD-10-CM | POA: Diagnosis not present

## 2019-04-23 DIAGNOSIS — C689 Malignant neoplasm of urinary organ, unspecified: Secondary | ICD-10-CM | POA: Diagnosis not present

## 2019-04-23 LAB — CULTURE, URINE COMPREHENSIVE

## 2019-04-23 MED ORDER — NITROFURANTOIN MONOHYD MACRO 100 MG PO CAPS: 100.0000 mg | ORAL_CAPSULE | Freq: Two times a day (BID) | ORAL | 0 refills | Status: DC

## 2019-04-23 NOTE — Telephone Encounter (Signed)
Pt advised.  RX sent to [facility] [address]   Thanks,   Jamecia Lerman  

## 2019-04-23 NOTE — Telephone Encounter (Signed)
-----   Message from [name], MD sent at 04/23/2019  8:22 AM EDT ----- Only antibiotic uti is sensitive to is nitrofurantoin. Please advise and send prescription for nitrofurantoin 100mg  twice a day for 10 days.

## 2019-04-24 ENCOUNTER — Ambulatory Visit
Admission: RE | Admit: 2019-04-24 | Discharge: 2019-04-24 | Disposition: A | Discharge status: Home / Self Care | Payer: PPO | Source: Ambulatory Visit | Attending: Radiation Oncology | Admitting: Radiation Oncology

## 2019-04-24 ENCOUNTER — Other Ambulatory Visit: Payer: Self-pay

## 2019-04-24 ENCOUNTER — Inpatient Hospital Stay (HOSPITAL_BASED_OUTPATIENT_CLINIC_OR_DEPARTMENT_OTHER): Payer: PPO | Admitting: Oncology

## 2019-04-24 ENCOUNTER — Encounter: Payer: Self-pay | Admitting: Emergency Medicine

## 2019-04-24 ENCOUNTER — Ambulatory Visit: Payer: PPO

## 2019-04-24 ENCOUNTER — Emergency Department
Admission: EM | Admit: 2019-04-24 | Discharge: 2019-04-25 | Disposition: A | Discharge status: Home / Self Care | Payer: PPO | Attending: Emergency Medicine | Admitting: Emergency Medicine

## 2019-04-24 ENCOUNTER — Inpatient Hospital Stay (HOSPITAL_BASED_OUTPATIENT_CLINIC_OR_DEPARTMENT_OTHER): Payer: PPO | Admitting: Hospice and Palliative Medicine

## 2019-04-24 ENCOUNTER — Inpatient Hospital Stay: Payer: PPO | Attending: Oncology

## 2019-04-24 ENCOUNTER — Encounter: Payer: Self-pay | Admitting: Oncology

## 2019-04-24 ENCOUNTER — Inpatient Hospital Stay: Payer: PPO

## 2019-04-24 VITALS — BP 115/66 | HR 82 | Resp 20

## 2019-04-24 VITALS — BP 95/67 | HR 112 | Temp 95.9°F | Resp 20 | Wt 233.3 lb

## 2019-04-24 DIAGNOSIS — R251 Tremor, unspecified: Secondary | ICD-10-CM | POA: Insufficient documentation

## 2019-04-24 DIAGNOSIS — R300 Dysuria: Secondary | ICD-10-CM | POA: Insufficient documentation

## 2019-04-24 DIAGNOSIS — Z9221 Personal history of antineoplastic chemotherapy: Secondary | ICD-10-CM | POA: Insufficient documentation

## 2019-04-24 DIAGNOSIS — Z923 Personal history of irradiation: Secondary | ICD-10-CM | POA: Diagnosis not present

## 2019-04-24 DIAGNOSIS — E876 Hypokalemia: Secondary | ICD-10-CM | POA: Insufficient documentation

## 2019-04-24 DIAGNOSIS — C7951 Secondary malignant neoplasm of bone: Secondary | ICD-10-CM | POA: Insufficient documentation

## 2019-04-24 DIAGNOSIS — R350 Frequency of micturition: Secondary | ICD-10-CM | POA: Insufficient documentation

## 2019-04-24 DIAGNOSIS — Z510 Encounter for antineoplastic radiation therapy: Secondary | ICD-10-CM | POA: Insufficient documentation

## 2019-04-24 DIAGNOSIS — N184 Chronic kidney disease, stage 4 (severe): Secondary | ICD-10-CM | POA: Insufficient documentation

## 2019-04-24 DIAGNOSIS — Z86718 Personal history of other venous thrombosis and embolism: Secondary | ICD-10-CM | POA: Insufficient documentation

## 2019-04-24 DIAGNOSIS — I2510 Atherosclerotic heart disease of native coronary artery without angina pectoris: Secondary | ICD-10-CM | POA: Diagnosis not present

## 2019-04-24 DIAGNOSIS — C8338 Diffuse large B-cell lymphoma, lymph nodes of multiple sites: Secondary | ICD-10-CM | POA: Insufficient documentation

## 2019-04-24 DIAGNOSIS — I959 Hypotension, unspecified: Secondary | ICD-10-CM | POA: Insufficient documentation

## 2019-04-24 DIAGNOSIS — R1900 Intra-abdominal and pelvic swelling, mass and lump, unspecified site: Secondary | ICD-10-CM | POA: Insufficient documentation

## 2019-04-24 DIAGNOSIS — Z8572 Personal history of non-Hodgkin lymphomas: Secondary | ICD-10-CM | POA: Insufficient documentation

## 2019-04-24 DIAGNOSIS — Z79899 Other long term (current) drug therapy: Secondary | ICD-10-CM | POA: Diagnosis not present

## 2019-04-24 DIAGNOSIS — R319 Hematuria, unspecified: Secondary | ICD-10-CM | POA: Insufficient documentation

## 2019-04-24 DIAGNOSIS — C689 Malignant neoplasm of urinary organ, unspecified: Secondary | ICD-10-CM | POA: Insufficient documentation

## 2019-04-24 DIAGNOSIS — Z7901 Long term (current) use of anticoagulants: Secondary | ICD-10-CM | POA: Insufficient documentation

## 2019-04-24 DIAGNOSIS — Z955 Presence of coronary angioplasty implant and graft: Secondary | ICD-10-CM | POA: Diagnosis not present

## 2019-04-24 DIAGNOSIS — I10 Essential (primary) hypertension: Secondary | ICD-10-CM | POA: Diagnosis not present

## 2019-04-24 DIAGNOSIS — N1330 Unspecified hydronephrosis: Secondary | ICD-10-CM | POA: Insufficient documentation

## 2019-04-24 DIAGNOSIS — R531 Weakness: Secondary | ICD-10-CM | POA: Insufficient documentation

## 2019-04-24 DIAGNOSIS — Z8551 Personal history of malignant neoplasm of bladder: Secondary | ICD-10-CM | POA: Insufficient documentation

## 2019-04-24 DIAGNOSIS — Z880 Allergy status to penicillin: Secondary | ICD-10-CM | POA: Insufficient documentation

## 2019-04-24 DIAGNOSIS — T402X5A Adverse effect of other opioids, initial encounter: Secondary | ICD-10-CM | POA: Insufficient documentation

## 2019-04-24 DIAGNOSIS — N281 Cyst of kidney, acquired: Secondary | ICD-10-CM | POA: Insufficient documentation

## 2019-04-24 DIAGNOSIS — Z6831 Body mass index (BMI) 31.0-31.9, adult: Secondary | ICD-10-CM | POA: Insufficient documentation

## 2019-04-24 DIAGNOSIS — Z882 Allergy status to sulfonamides status: Secondary | ICD-10-CM | POA: Insufficient documentation

## 2019-04-24 DIAGNOSIS — B9620 Unspecified Escherichia coli [E. coli] as the cause of diseases classified elsewhere: Secondary | ICD-10-CM | POA: Insufficient documentation

## 2019-04-24 DIAGNOSIS — N4889 Other specified disorders of penis: Secondary | ICD-10-CM | POA: Insufficient documentation

## 2019-04-24 DIAGNOSIS — Z87891 Personal history of nicotine dependence: Secondary | ICD-10-CM | POA: Diagnosis not present

## 2019-04-24 DIAGNOSIS — R7989 Other specified abnormal findings of blood chemistry: Secondary | ICD-10-CM | POA: Insufficient documentation

## 2019-04-24 DIAGNOSIS — G893 Neoplasm related pain (acute) (chronic): Secondary | ICD-10-CM | POA: Diagnosis not present

## 2019-04-24 DIAGNOSIS — R339 Retention of urine, unspecified: Secondary | ICD-10-CM

## 2019-04-24 DIAGNOSIS — M549 Dorsalgia, unspecified: Secondary | ICD-10-CM | POA: Insufficient documentation

## 2019-04-24 DIAGNOSIS — Z881 Allergy status to other antibiotic agents status: Secondary | ICD-10-CM | POA: Insufficient documentation

## 2019-04-24 DIAGNOSIS — R3915 Urgency of urination: Secondary | ICD-10-CM | POA: Diagnosis not present

## 2019-04-24 DIAGNOSIS — Z820 Family history of epilepsy and other diseases of the nervous system: Secondary | ICD-10-CM | POA: Insufficient documentation

## 2019-04-24 DIAGNOSIS — Z888 Allergy status to other drugs, medicaments and biological substances status: Secondary | ICD-10-CM | POA: Insufficient documentation

## 2019-04-24 DIAGNOSIS — Z8262 Family history of osteoporosis: Secondary | ICD-10-CM | POA: Insufficient documentation

## 2019-04-24 DIAGNOSIS — R410 Disorientation, unspecified: Secondary | ICD-10-CM | POA: Insufficient documentation

## 2019-04-24 DIAGNOSIS — I714 Abdominal aortic aneurysm, without rupture: Secondary | ICD-10-CM | POA: Insufficient documentation

## 2019-04-24 DIAGNOSIS — Z9079 Acquired absence of other genital organ(s): Secondary | ICD-10-CM | POA: Insufficient documentation

## 2019-04-24 DIAGNOSIS — K5730 Diverticulosis of large intestine without perforation or abscess without bleeding: Secondary | ICD-10-CM | POA: Insufficient documentation

## 2019-04-24 DIAGNOSIS — Z515 Encounter for palliative care: Secondary | ICD-10-CM

## 2019-04-24 DIAGNOSIS — R5381 Other malaise: Secondary | ICD-10-CM | POA: Diagnosis not present

## 2019-04-24 DIAGNOSIS — G4700 Insomnia, unspecified: Secondary | ICD-10-CM | POA: Insufficient documentation

## 2019-04-24 DIAGNOSIS — D649 Anemia, unspecified: Secondary | ICD-10-CM | POA: Insufficient documentation

## 2019-04-24 DIAGNOSIS — Z66 Do not resuscitate: Secondary | ICD-10-CM | POA: Insufficient documentation

## 2019-04-24 DIAGNOSIS — N323 Diverticulum of bladder: Secondary | ICD-10-CM | POA: Insufficient documentation

## 2019-04-24 DIAGNOSIS — R39198 Other difficulties with micturition: Secondary | ICD-10-CM | POA: Insufficient documentation

## 2019-04-24 DIAGNOSIS — N390 Urinary tract infection, site not specified: Secondary | ICD-10-CM | POA: Insufficient documentation

## 2019-04-24 DIAGNOSIS — Z8719 Personal history of other diseases of the digestive system: Secondary | ICD-10-CM | POA: Insufficient documentation

## 2019-04-24 DIAGNOSIS — K5903 Drug induced constipation: Secondary | ICD-10-CM | POA: Insufficient documentation

## 2019-04-24 LAB — COMPREHENSIVE METABOLIC PANEL
ALT: 19 U/L (ref 0–44)
AST: 23 U/L (ref 15–41)
Albumin: 3.1 g/dL — ABNORMAL LOW (ref 3.5–5.0)
Alkaline Phosphatase: 70 U/L (ref 38–126)
Anion gap: 11 (ref 5–15)
BUN: 29 mg/dL — ABNORMAL HIGH (ref 8–23)
CO2: 22 mmol/L (ref 22–32)
Calcium: 8.2 mg/dL — ABNORMAL LOW (ref 8.9–10.3)
Chloride: 96 mmol/L — ABNORMAL LOW (ref 98–111)
Creatinine, Ser: 1.5 mg/dL — ABNORMAL HIGH (ref 0.61–1.24)
GFR calc Af Amer: 49 mL/min — ABNORMAL LOW (ref >60)
GFR calc non Af Amer: 42 mL/min — ABNORMAL LOW (ref >60)
Glucose, Bld: 153 mg/dL — ABNORMAL HIGH (ref 70–99)
Potassium: 4.4 mmol/L (ref 3.5–5.1)
Sodium: 129 mmol/L — ABNORMAL LOW (ref 135–145)
Total Bilirubin: 0.3 mg/dL (ref 0.3–1.2)
Total Protein: 6.7 g/dL (ref 6.5–8.1)

## 2019-04-24 LAB — BASIC METABOLIC PANEL
Anion gap: 10 (ref 5–15)
BUN: 31 mg/dL — ABNORMAL HIGH (ref 8–23)
CO2: 28 mmol/L (ref 22–32)
Calcium: 9 mg/dL (ref 8.9–10.3)
Chloride: 96 mmol/L — ABNORMAL LOW (ref 98–111)
Creatinine, Ser: 1.84 mg/dL — ABNORMAL HIGH (ref 0.61–1.24)
GFR calc Af Amer: 38 mL/min — ABNORMAL LOW (ref >60)
GFR calc non Af Amer: 33 mL/min — ABNORMAL LOW (ref >60)
Glucose, Bld: 151 mg/dL — ABNORMAL HIGH (ref 70–99)
Potassium: 3.8 mmol/L (ref 3.5–5.1)
Sodium: 134 mmol/L — ABNORMAL LOW (ref 135–145)

## 2019-04-24 LAB — CBC WITH DIFFERENTIAL/PLATELET
Abs Immature Granulocytes: 0.04 10*3/uL (ref 0.00–0.07)
Abs Immature Granulocytes: 0.05 10*3/uL (ref 0.00–0.07)
Basophils Absolute: 0 10*3/uL (ref 0.0–0.1)
Basophils Absolute: 0 10*3/uL (ref 0.0–0.1)
Basophils Relative: 1 %
Basophils Relative: 0 %
Eosinophils Absolute: 0.2 10*3/uL (ref 0.0–0.5)
Eosinophils Absolute: 0 10*3/uL (ref 0.0–0.5)
Eosinophils Relative: 3 %
Eosinophils Relative: 0 %
HCT: 28.4 % — ABNORMAL LOW (ref 39.0–52.0)
HCT: 24.7 % — ABNORMAL LOW (ref 39.0–52.0)
Hemoglobin: 9.3 g/dL — ABNORMAL LOW (ref 13.0–17.0)
Hemoglobin: 8.1 g/dL — ABNORMAL LOW (ref 13.0–17.0)
Immature Granulocytes: 1 %
Immature Granulocytes: 1 %
Lymphocytes Relative: 6 %
Lymphocytes Relative: 4 %
Lymphs Abs: 0.3 10*3/uL — ABNORMAL LOW (ref 0.7–4.0)
Lymphs Abs: 0.1 10*3/uL — ABNORMAL LOW (ref 0.7–4.0)
MCH: 27.6 pg (ref 26.0–34.0)
MCH: 27.4 pg (ref 26.0–34.0)
MCHC: 32.7 g/dL (ref 30.0–36.0)
MCHC: 32.8 g/dL (ref 30.0–36.0)
MCV: 84.3 fL (ref 80.0–100.0)
MCV: 83.4 fL (ref 80.0–100.0)
Monocytes Absolute: 0.5 10*3/uL (ref 0.1–1.0)
Monocytes Absolute: 0.1 10*3/uL (ref 0.1–1.0)
Monocytes Relative: 10 %
Monocytes Relative: 3 %
Neutro Abs: 3.7 10*3/uL (ref 1.7–7.7)
Neutro Abs: 3.3 10*3/uL (ref 1.7–7.7)
Neutrophils Relative %: 79 %
Neutrophils Relative %: 92 %
Platelets: 194 10*3/uL (ref 150–400)
Platelets: 176 10*3/uL (ref 150–400)
RBC: 3.37 MIL/uL — ABNORMAL LOW (ref 4.22–5.81)
RBC: 2.96 MIL/uL — ABNORMAL LOW (ref 4.22–5.81)
RDW: 15.1 % (ref 11.5–15.5)
RDW: 15 % (ref 11.5–15.5)
WBC: 4.6 10*3/uL (ref 4.0–10.5)
WBC: 3.6 10*3/uL — ABNORMAL LOW (ref 4.0–10.5)
nRBC: 0 % (ref 0.0–0.2)
nRBC: 0 % (ref 0.0–0.2)

## 2019-04-24 MED ORDER — POTASSIUM CHLORIDE 2 MEQ/ML IV SOLN
Freq: Once | INTRAVENOUS | Status: AC
  Administered 2019-04-24: 11:00:00 via INTRAVENOUS
  Filled 2019-04-24: qty 1000

## 2019-04-24 MED ORDER — SODIUM CHLORIDE 0.9 % IV SOLN
Freq: Once | INTRAVENOUS | Status: AC
  Administered 2019-04-24: 10:00:00 via INTRAVENOUS
  Filled 2019-04-24: qty 250

## 2019-04-24 MED ORDER — SODIUM CHLORIDE 0.9 % IV BOLUS
1000.0000 mL | Freq: Once | INTRAVENOUS | Status: AC
  Administered 2019-04-24: 22:00:00 1000 mL via INTRAVENOUS

## 2019-04-24 MED ORDER — SODIUM CHLORIDE 0.9 % IV SOLN
Freq: Once | INTRAVENOUS | Status: AC
  Administered 2019-04-24: 13:00:00 via INTRAVENOUS
  Filled 2019-04-24: qty 5

## 2019-04-24 MED ORDER — SODIUM CHLORIDE 0.9 % IV SOLN
35.0000 mg/m2 | Freq: Once | INTRAVENOUS | Status: AC
  Administered 2019-04-24: 13:00:00 85 mg via INTRAVENOUS
  Filled 2019-04-24: qty 85

## 2019-04-24 MED ORDER — HYDROMORPHONE HCL 1 MG/ML IJ SOLN
1.0000 mg | Freq: Once | INTRAMUSCULAR | Status: AC
  Administered 2019-04-24: 1 mg via INTRAVENOUS
  Filled 2019-04-24: qty 1

## 2019-04-24 MED ORDER — PALONOSETRON HCL INJECTION 0.25 MG/5ML
0.2500 mg | Freq: Once | INTRAVENOUS | Status: AC
  Administered 2019-04-24: 13:00:00 0.25 mg via INTRAVENOUS
  Filled 2019-04-24: qty 5

## 2019-04-24 NOTE — ED Triage Notes (Signed)
Pt via EMS from home, pt states he feels that he is retaining urine. Pt tried to self-cath himself but the cath would not advance and blood was in his urine. Pt last voided during his chemo treatment this am. Pt has a hx of prostate issues and bladder cancer.

## 2019-04-24 NOTE — ED Notes (Signed)
Pt attempting to urinate at this time

## 2019-04-24 NOTE — ED Notes (Signed)
Pt states "i've got to get something done here". Pt states "I can't keep on like this". Pt informed that he is to get liter of fluids to dilute urine. Pt states "how long will that take". Pt informed to keep arm straight and fluids will infuse in approx 50 minutes. Call bell at side. Urinal in pt's hand.

## 2019-04-24 NOTE — Progress Notes (Signed)
Patient had multiple pain medications prescribed by other MDs.  He was confused on what pain medication he should be taking.  The only pain medication that was prescribed by Dr. Grayland Ormond is the Fentanyl and Oxycodone 10 mg.   The other pain medications which consisted of Oxycodone 5/325 (quantity of 7), Hydrocodone 7.5-325 mg (quantity of 5), and Tylenol #3 (quantity of 2) were properly disposed, witnessed by Geraldine Solar, CMA.

## 2019-04-24 NOTE — ED Notes (Signed)
Pt on toilet, states he needs a few more minutes on commode before pain medication is administered. Pt informed this RN will return in approx 10 minutes to administer pain medication.

## 2019-04-24 NOTE — Progress Notes (Signed)
Patient reports having new low right side back pain that started after reaching for something yesterday, 10/10 on pain scale today.  Patient is confused about his pain medications and he brings. multiple pain medication with him today that are prescribed by different MDs.  There is a bottle of oxycodone 100mg  prescribed by Dr. Grayland Ormond that he has not taken.  The one he is taking is for Tylenol #3 prescribed by Perlie Gold, DDS and it does not help with the pain.  Also brings in a bottle of oxycodone 5/325 mg (not taking) prescribed by Lenise Arena, MD.  He states that something needs to be done about the back pain but also doesn't want to take pain medications because it is "addicting".

## 2019-04-24 NOTE — ED Provider Notes (Signed)
[facility] Emergency Department Provider Note  ____________________________________________  Time seen: Approximately 10:23 PM  I have reviewed the triage vital signs and the nursing notes.   HISTORY  Chief Complaint Hematuria    HPI REVEL STELLMACH is a 85 y.o. male with a history of B-cell lymphoma, bladder cancer, diverticulitis who comes the ED complaining of urinary urgency and feeling like he is unable to pass his urine as well as blood in the urine.  All started within the last few hours.  He had chemo and radiation therapy for his bladder at the cancer center this morning.   Denies fever chills body ache chest pain shortness of breath.  No abdominal pain or back pain.  Symptoms are intermittent without aggravating or alleviating factors.  Nonradiating.     Past Medical History:  Diagnosis Date  . Arteriosclerosis of coronary artery 08/26/2011   Overview:      a.  1999 PCI of the mid LAD with stent.      b.  2002 Cath [place]: EF 56%. RCA-distal 25/25%. Left main-50% ostial.  Left circumflex-25% OM2.  LAD-25% proximal.  75% mid.  25/25% distal. 75% D1.      c.  07/2011 PCI of RCA with DES.   . Bleeding gastrointestinal 08/26/2011   Overview:      a.  Chronic abdominal pain, present improving.      b.  Duodenitis and gastritis by EGD in 2000.      c.  Pylori in 2000, treated.      d.  Gastroesophageal reflux disease.      e.  Diverticular disease.    . BP (high blood pressure) 08/26/2011  . Closed dislocation of right ankle 09/10/2016   Successfully reduced in ER 09/10/2016  . Diffuse large B-cell lymphoma ([place]) 2015   chemo tx's  . GERD (gastroesophageal reflux disease)   . Heart disease   . Helicobacter pylori infection 06/18/2015   by EGD RX 08/18/1999   . History of adenomatous polyp of colon 06/18/2015  . HOH (hard of hearing)    Bilateral Hearing  Aids  . Malleolar fracture (Right) 09/10/2016     Patient Active Problem List   Diagnosis  Date Noted  . DVT of deep femoral vein, left ([place]) 03/24/2019  . Angioedema 03/16/2019  . Goals of care, counseling/discussion 03/03/2019  . Urothelial carcinoma ([place]) 03/03/2019  . BPH (benign prostatic hyperplasia) 03/02/2017  . Recurrent UTI 12/21/2015  . Urinary retention 09/24/2015  . Narrowing of intervertebral disc space 06/18/2015  . Diverticulitis 06/18/2015  . Esophageal reflux 06/18/2015  . Familial multiple lipoprotein-type hyperlipidemia 06/18/2015  . Insomnia 06/18/2015  . Vitamin D deficiency 06/18/2015  . DLBCL (diffuse large B cell lymphoma) ([place]) 04/10/2014  . Lymphoma ([place]) 04/10/2014  . Adenopathy 02/19/2014  . ED (erectile dysfunction) of organic origin 08/03/2012  . Incomplete bladder emptying 08/03/2012  . Enlarged prostate with lower urinary tract symptoms (LUTS) 08/03/2012  . Breath shortness 12/09/2011  . Shortness of breath 12/09/2011  . Arteriosclerosis of coronary artery 08/26/2011  . Bleeding gastrointestinal 08/26/2011  . HLD (hyperlipidemia) 08/26/2011  . Essential hypertension 08/26/2011  . Peripheral nerve disease 08/26/2011  . Tobacco use 08/26/2011  . CAD (coronary artery disease) 08/26/2011  . Coronary atherosclerosis 08/26/2011  . Peripheral neuropathy 08/26/2011  . Benign prostatic hyperplasia without lower urinary tract symptoms 08/26/2011  . Hypertension 08/26/2011  . Gastrointestinal hemorrhage 08/26/2011  . Gastrointestinal hemorrhage, unspecified 08/26/2011  . Hyperlipidemia, unspecified 08/26/2011  Past Surgical History:  Procedure Laterality Date  . ANGIOPLASTY    . CATARACT EXTRACTION    . COLONOSCOPY  2016  . CORONARY ANGIOPLASTY    . CYSTOSCOPY WITH STENT PLACEMENT Left 03/17/2019   Procedure: CYSTOSCOPY WITH STENT PLACEMENT;  Surgeon: Festus Aloe, MD;  Location: [facility] ORS;  Service: Urology;  Laterality: Left;  . EYE SURGERY Bilateral    Cataract Extraction with IOL  . GREEN LIGHT LASER TURP (TRANSURETHRAL  RESECTION OF PROSTATE N/A 04/16/2015   Procedure: GREEN LIGHT LASER TURP (TRANSURETHRAL RESECTION OF PROSTATE WITH BLADDER BIOPSY;  Surgeon: Collier Flowers, MD;  Location: [facility] ORS;  Service: Urology;  Laterality: N/A;  . heart stent placement     1998, 2000, 2012  . PERIPHERAL VASCULAR THROMBECTOMY Left 03/22/2019   Procedure: PERIPHERAL VASCULAR THROMBECTOMY and possible IVC filter;  Surgeon: Algernon Huxley, MD;  Location: [place];  Service: Cardiovascular;  Laterality: Left;  . TRANSURETHRAL RESECTION OF PROSTATE N/A 03/02/2017   Procedure: TRANSURETHRAL RESECTION OF THE PROSTATE (TURP);  Surgeon: Hollice Espy, MD;  Location: [facility] OR[facility];  Service: Urology;  Laterality: N/A;     Prior to Admission medications   Medication Sig Start Date End Date Taking? Authorizing Provider  acetaminophen-codeine (TYLENOL #3) 300-30 MG tablet Take by mouth every 4 (four) hours as needed for moderate pain.    [provider]  ALPRAZolam Duanne Moron) 0.5 MG tablet Take 1 tablet (0.5 mg total) by mouth at bedtime as needed for anxiety. 03/20/19   Lloyd Huger, MD  apixaban (ELIQUIS) 5 MG TABS tablet Take 1 tablet (5 mg total) by mouth 2 (two) times daily. 03/24/19   Elmore Guise, MD  atorvastatin (LIPITOR) 10 MG tablet Take 1 tablet (10 mg total) by mouth daily. 03/17/19 03/16/20  Dustin Flock, MD  Cholecalciferol (VITAMIN D3) 1000 units CAPS Take 1,000 Units by mouth daily.    [provider]  doxycycline (VIBRA-TABS) 100 MG tablet Take 1 tablet (100 mg total) by mouth 2 (two) times daily. 04/11/19   Lloyd Huger, MD  famotidine (PEPCID) 20 MG tablet Take 1 tablet (20 mg total) by mouth daily. 03/24/19   Elmore Guise, MD  fentaNYL (DURAGESIC) 25 MCG/HR Place 1 patch onto the skin every 3 (three) days.    [provider]  finasteride (PROSCAR) 5 MG tablet Take 1 tablet (5 mg total) by mouth daily. 11/01/18   Zara Council A, PA-C  gabapentin (NEURONTIN) 300 MG  capsule Take 1 capsule (300 mg total) by mouth at bedtime. Do not take if you take temazepam 04/20/19   [name], MD  hydrochlorothiazide (HYDRODIURIL) 25 MG tablet Take 0.5 tablets (12.5 mg total) by mouth daily. 03/26/19   [name], MD  isosorbide dinitrate (ISORDIL) 30 MG tablet Take 30 mg by mouth daily.    [provider]  Magnesium 250 MG TABS Take 250 mg by mouth daily.    [provider]  metoprolol succinate (TOPROL-XL) 25 MG 24 hr tablet Take 1 tablet (25 mg total) by mouth daily. 10/19/18   [name], MD  mirabegron ER (MYRBETRIQ) 25 MG TB24 tablet Take 1 tablet (25 mg total) by mouth daily. 04/06/19   Noreene Filbert, MD  Multiple Vitamins-Minerals (VISION FORMULA 2 PO) Take by mouth daily.    [provider]  naproxen sodium (ALEVE) 220 MG tablet Take 220 mg by mouth daily as needed.    [provider]  nitrofurantoin, macrocrystal-monohydrate, (MACROBID) 100 MG capsule  Take 1 capsule (100 mg total) by mouth 2 (two) times daily. 04/23/19   [name], MD  ondansetron (ZOFRAN) 8 MG tablet Take 1 tablet (8 mg total) by mouth 2 (two) times daily as needed for nausea or vomiting. 03/20/19   Lloyd Huger, MD  Oxycodone HCl 10 MG TABS Take by mouth every 4 (four) hours as needed.    [provider]  oxyCODONE-acetaminophen (PERCOCET/ROXICET) 5-325 MG tablet Take by mouth every 8 (eight) hours as needed for severe pain.    [provider]  polyethylene glycol powder (CLEARLAX) 17 GM/SCOOP powder Take 1 Container by mouth daily.    [provider]  prochlorperazine (COMPAZINE) 10 MG tablet Take 1 tablet (10 mg total) by mouth every 6 (six) hours as needed for nausea or vomiting. 03/20/19   Lloyd Huger, MD  psyllium (METAMUCIL) 58.6 % packet Take 1 packet by mouth daily.    [provider]  traMADol (ULTRAM) 50 MG tablet Take by mouth every 6 (six) hours as needed. Patient is Unsure of dose.     [provider]  zolpidem (AMBIEN) 10 MG tablet Take 1 tablet (10 mg total) by mouth at bedtime as needed for sleep. 03/27/19 04/26/19  Lloyd Huger, MD  benazepril-hydrochlorthiazide (LOTENSIN HCT) 5-6.25 MG tablet Take 1 tablet by mouth once daily 10/19/18 03/16/19  [name], MD     Allergies Augmentin [amoxicillin-pot clavulanate], Ciprofloxacin, Lisinopril, Penicillins, Bactrim [sulfamethoxazole-trimethoprim], and Sulfa antibiotics   Family History  Problem Relation Age of Onset  . Osteoporosis Mother   . Alzheimer's disease Father   . Kidney disease Neg Hx   . Prostate cancer Neg Hx   . Bladder Cancer Neg Hx   . Kidney cancer Neg Hx     Social History Social History   Tobacco Use  . Smoking status: Former Smoker    Packs/day: 1.50    Years: 12.00    Pack years: 18.00    Types: Cigarettes    Quit date: 08/24/1971    Years since quitting: 47.6  . Smokeless tobacco: Never Used  Substance Use Topics  . Alcohol use: No    Alcohol/week: 0.0 standard drinks  . Drug use: No    Review of Systems  Constitutional:   No fever or chills.  ENT:   No sore throat. No rhinorrhea. Cardiovascular:   No chest pain or syncope. Respiratory:   No dyspnea or cough. Gastrointestinal:   Negative for abdominal pain, vomiting and diarrhea.  Musculoskeletal:   Negative for focal pain or swelling All other systems reviewed and are negative except as documented above in ROS and HPI.  ____________________________________________   PHYSICAL EXAM:  VITAL SIGNS: ED Triage Vitals  Enc Vitals Group     BP 04/24/19 2126 (!) 142/101     Pulse Rate 04/24/19 2126 (!) 108     Resp 04/24/19 2126 (!) 23     Temp 04/24/19 2126 97.9 F (36.6 C)     Temp src --      SpO2 04/24/19 2126 96 %     Weight 04/24/19 2131 235 lb (106.6 kg)     Height 04/24/19 2131 6\' 1"  (1.854 m)     Head Circumference --      Peak Flow --      Pain Score 04/24/19 2129 0     Pain Loc --       Pain Edu? --      Excl. in [place]? --  Vital signs reviewed, nursing assessments reviewed.   Constitutional:   Alert and oriented. Non-toxic appearance. Eyes:   Conjunctivae are normal. EOMI. PERRL. ENT      Head:   Normocephalic and atraumatic.      Nose:   No congestion/rhinnorhea.       Mouth/Throat:   MMM, no pharyngeal erythema. No peritonsillar mass.       Neck:   No meningismus. Full ROM. Hematological/Lymphatic/Immunilogical:   No cervical lymphadenopathy. Cardiovascular:   RRR. Symmetric bilateral radial and DP pulses.  No murmurs. Cap refill less than 2 seconds. Respiratory:   Normal respiratory effort without tachypnea/retractions. Breath sounds are clear and equal bilaterally. No wheezes/rales/rhonchi. Gastrointestinal:   Soft with mild suprapubic tenderness. Non distended. There is no CVA tenderness.  No rebound, rigidity, or guarding.  Musculoskeletal:   Normal range of motion in all extremities. No joint effusions.  No lower extremity tenderness.  No edema. Neurologic:   Normal speech and language.  Motor grossly intact. No acute focal neurologic deficits are appreciated.  Skin:    Skin is warm, dry and intact. No rash noted.  No petechiae, purpura, or bullae.  ____________________________________________    LABS (pertinent positives/negatives) (all labs ordered are listed, but only abnormal results are displayed) Labs Reviewed  URINE CULTURE  COMPREHENSIVE METABOLIC PANEL  CBC WITH DIFFERENTIAL/PLATELET  URINALYSIS, COMPLETE (UACMP) WITH MICROSCOPIC   ____________________________________________   EKG    ____________________________________________    RADIOLOGY  No results found.  ____________________________________________   PROCEDURES Procedures  ____________________________________________    CLINICAL IMPRESSION / ASSESSMENT AND PLAN / ED COURSE  Medications ordered in the ED: Medications  sodium chloride 0.9 % bolus 1,000 mL (has no  administration in time range)    Pertinent labs & imaging results that were available during my care of the patient were reviewed by me and considered in my medical decision making (see chart for details).  Bryan Nelson was evaluated in Emergency Department on 04/24/2019 for the symptoms described in the history of present illness. He was evaluated in the context of the global COVID-19 pandemic, which necessitated consideration that the patient might be at risk for infection with the SARS-CoV-2 virus that causes COVID-19. Institutional protocols and algorithms that pertain to the evaluation of patients at risk for COVID-19 are in a state of rapid change based on information released by regulatory bodies including the CDC and federal and state organizations. These policies and algorithms were followed during the patient's care in the ED.   Patient presents with urinary frequency/urgency, reported hematuria.  This is not unexpected after having bladder radiation therapy today, but if he is having bleeding in his bladder, this may be causing a lot of discomfort.  Give IV fluids for hydration and urinary dilution.  Check labs and reassess ability to void.  May need Foley if unable to spontaneously void after hydration.      ____________________________________________   FINAL CLINICAL IMPRESSION(S) / ED DIAGNOSES    Final diagnoses:  Hematuria, unspecified type     ED Discharge Orders    None      Portions of this note were generated with dragon dictation software. Dictation errors may occur despite best attempts at proofreading.   Carrie Mew, MD 04/24/19 2226

## 2019-04-24 NOTE — ED Notes (Signed)
MD into reassess.

## 2019-04-25 ENCOUNTER — Inpatient Hospital Stay (HOSPITAL_BASED_OUTPATIENT_CLINIC_OR_DEPARTMENT_OTHER): Payer: PPO | Admitting: Hospice and Palliative Medicine

## 2019-04-25 ENCOUNTER — Ambulatory Visit: Payer: PPO

## 2019-04-25 ENCOUNTER — Other Ambulatory Visit: Payer: Self-pay

## 2019-04-25 ENCOUNTER — Ambulatory Visit
Admission: RE | Admit: 2019-04-25 | Discharge: 2019-04-25 | Disposition: A | Discharge status: Home / Self Care | Payer: PPO | Source: Ambulatory Visit | Attending: Radiation Oncology | Admitting: Radiation Oncology

## 2019-04-25 ENCOUNTER — Telehealth: Payer: Self-pay | Admitting: Urology

## 2019-04-25 VITALS — BP 144/78 | HR 88 | Temp 98.2°F | Resp 18

## 2019-04-25 DIAGNOSIS — G893 Neoplasm related pain (acute) (chronic): Secondary | ICD-10-CM

## 2019-04-25 DIAGNOSIS — C689 Malignant neoplasm of urinary organ, unspecified: Secondary | ICD-10-CM | POA: Diagnosis not present

## 2019-04-25 DIAGNOSIS — Z7189 Other specified counseling: Secondary | ICD-10-CM | POA: Diagnosis not present

## 2019-04-25 LAB — URINALYSIS, COMPLETE (UACMP) WITH MICROSCOPIC
Bacteria, UA: NONE SEEN
RBC / HPF: >50 RBC/hpf (ref 0–5)
Specific Gravity, Urine: 1.02 (ref 1.005–1.030)
Squamous Epithelial / HPF: NONE SEEN (ref 0–5)

## 2019-04-25 NOTE — ED Notes (Signed)
Pt ringing call bell again to report "I just don't want to do this anymore" regarding frequent urination. Pt provided with reassurance and assisted with removing pants.

## 2019-04-25 NOTE — Telephone Encounter (Signed)
Bryan Nelson is speaking to him.

## 2019-04-25 NOTE — Discharge Instructions (Addendum)
Please continue your regular medications and continue drinking plenty of clear fluids.  Call Dr. Cherrie Gauze office for a follow up appointment.  Return to the emergency department if you develop new or worsening symptoms that concern you.

## 2019-04-25 NOTE — Progress Notes (Addendum)
Symptom Management [place]  [phone] [phone]  Patient Care Team: [name], MD as PCP - General (Family Medicine) Dingeldein, Remo Lipps, MD as Consulting Physician (Ophthalmology) Hollice Espy, MD as Consulting Physician (Urology) Yolonda Kida, MD as Consulting Physician (Cardiology) Laneta Simmers as Physician Assistant (Urology)   Name of the patient: Bryan Nelson  [id number]  Dec 31, 1931   Date of visit: 04/25/19  Diagnosis-  high-grade urothelial carcinoma   Chief complaint/ Reason for visit-penile pain  Heme/Onc history:  Oncology History Overview Note   Diffuse Large B Cell Lymphoma: Patient was initially diagnosed in August 2015, pathology was noted to be anaplastic subtype.  He completed 6 cycles of R-CHOP chemotherapy in January 2016. PET scan January 2017 revealed no evidence of disease.  2.  Urothelial carcinoma: Confirmed by biopsy.  Patient's most recent CT scan on March 01, 2019 reviewed independently and reported as above with progressive left pelvic mass greater than 4 cm.  Patient appears to have no other evidence of disease.  Case discussed with urology.  Given  the fact that patient is symptomatic with pain and has localized disease, he will benefit from concurrent chemotherapy using weekly cisplatin 40 mg/m along with daily XRT.  Delay cycle 1 of cisplatin today secondary to elevated creatinine.  Continue daily XRT.  Return to clinic in 1 week for further evaluation and reconsideration of cycle 2.     DLBCL (diffuse large B cell lymphoma) ([facility])  04/10/2014 Initial Diagnosis   DLBCL (diffuse large B cell lymphoma) ([facility])     Interval history-patient was seen in the clinic yesterday and had complaints of pain but has been noncompliant with pain medications.  He presented last night to the ER with complaints of hematuria thought likely secondary to effect from radiation.  It sounds as if he  was having some difficulty voiding and there was question of possible need for Foley.  Today he presents to the clinic with complaints of penile pain.  UA was checked but analysis was obscured by turbidity and discoloration.  Patient has been self catheterizing and draining large clots.  Denies any neurologic complaints. Denies recent fevers or illnesses. Denies any easy bleeding or bruising. Reports good appetite and denies weight loss. Denies chest pain. Denies any nausea, vomiting, constipation, or diarrhea. Denies urinary complaints. Patient offers no further specific complaints today.  ECOG FS:2 - Symptomatic, <50% confined to bed  Review of systems- Review of Systems  Constitutional: Positive for weight loss. Negative for chills, fever and malaise/fatigue.  Respiratory: Negative for cough.   Genitourinary: Positive for frequency and hematuria. Negative for dysuria, flank pain and urgency.     Current treatment-cisplatin and RT  Allergies  Allergen Reactions   Augmentin [Amoxicillin-Pot Clavulanate]     Tongue swelling 2 days after taking   Ciprofloxacin Diarrhea   Lisinopril Other (See Comments)    Unknown    Penicillins Swelling    unknwon reaction.  tolerates amoxicillin Has patient had a PCN reaction causing immediate rash, facial/tongue/throat swelling, SOB or lightheadedness with hypotension: No Has patient had a PCN reaction causing severe rash involving mucus membranes or skin necrosis: No Has patient had a PCN reaction that required hospitalization: No Has patient had a PCN reaction occurring within the last 10 years: Yes If all of the above answers are "NO", then may proceed with Cephalosporin use.    Bactrim [Sulfamethoxazole-Trimethoprim] Rash   Sulfa Antibiotics Itching and Rash  Past Medical History:  Diagnosis Date   Arteriosclerosis of coronary artery 08/26/2011   Overview:      a.  1999 PCI of the mid LAD with stent.      b.  2002 Cath [facility]: EF  56%. RCA-distal 25/25%. Left main-50% ostial.  Left circumflex-25% OM2.  LAD-25% proximal.  75% mid.  25/25% distal. 75% D1.      c.  07/2011 PCI of RCA with DES.    Bleeding gastrointestinal 08/26/2011   Overview:      a.  Chronic abdominal pain, present improving.      b.  Duodenitis and gastritis by EGD in 2000.      c.  Pylori in 2000, treated.      d.  Gastroesophageal reflux disease.      e.  Diverticular disease.     BP (high blood pressure) 08/26/2011   Closed dislocation of right ankle 09/10/2016   Successfully reduced in ER 09/10/2016   Diffuse large B-cell lymphoma ([facility]) 2015   chemo tx's   GERD (gastroesophageal reflux disease)    Heart disease    Helicobacter pylori infection 06/18/2015   by EGD RX 08/18/1999    History of adenomatous polyp of colon 06/18/2015   HOH (hard of hearing)    Bilateral Hearing  Aids   Malleolar fracture (Right) 09/10/2016    Past Surgical History:  Procedure Laterality Date   ANGIOPLASTY     CATARACT EXTRACTION     COLONOSCOPY  2016   CORONARY ANGIOPLASTY     CYSTOSCOPY WITH STENT PLACEMENT Left 03/17/2019   Procedure: CYSTOSCOPY WITH STENT PLACEMENT;  Surgeon: Festus Aloe, MD;  Location: [facility];  Service: Urology;  Laterality: Left;   EYE SURGERY Bilateral    Cataract Extraction with IOL   GREEN LIGHT LASER TURP (TRANSURETHRAL RESECTION OF PROSTATE N/A 04/16/2015   Procedure: GREEN LIGHT LASER TURP (TRANSURETHRAL RESECTION OF PROSTATE WITH BLADDER BIOPSY;  Surgeon: Collier Flowers, MD;  Location: [facility];  Service: Urology;  Laterality: N/A;   heart stent placement     1998, 2000, 2012   PERIPHERAL VASCULAR THROMBECTOMY Left 03/22/2019   Procedure: PERIPHERAL VASCULAR THROMBECTOMY and possible IVC filter;  Surgeon: Algernon Huxley, MD;  Location: [facility];  Service: Cardiovascular;  Laterality: Left;   TRANSURETHRAL RESECTION OF PROSTATE N/A 03/02/2017   Procedure: TRANSURETHRAL RESECTION OF THE PROSTATE  (TURP);  Surgeon: Hollice Espy, MD;  Location: [facility];  Service: Urology;  Laterality: N/A;    Social History   Socioeconomic History   Marital status: Widowed    Spouse name: Not on file   Number of children: 0   Years of education: Not on file   Highest education level: 12th grade  Occupational History   Occupation: retired  Scientist, 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   Food insecurity    Worry: Never true    Inability: Never true   Transportation needs    Medical: No    Non-medical: No  Tobacco Use   Smoking status: Former Smoker    Packs/day: 1.50    Years: 12.00    Pack years: 18.00    Types: Cigarettes    Quit date: 08/24/1971    Years since quitting: 47.7   Smokeless tobacco: Never Used  Substance and Sexual Activity   Alcohol use: No    Alcohol/week: 0.0 standard drinks   Drug use: No   Sexual activity: Not on file  Lifestyle  Physical activity    Days per week: 0 days    Minutes per session: 0 min   Stress: Not at all  Relationships   Social connections    Talks on phone: Patient refused    Gets together: Patient refused    Attends religious service: Patient refused    Active member of club or organization: Patient refused    Attends meetings of clubs or organizations: Patient refused    Relationship status: Patient refused   Intimate partner violence    Fear of current or ex partner: Patient refused    Emotionally abused: Patient refused    Physically abused: Patient refused    Forced sexual activity: Patient refused  Other Topics Concern   Not on file  Social History Narrative   Not on file    Family History  Problem Relation Age of Onset   Osteoporosis Mother    Alzheimer's disease Father    Kidney disease Neg Hx    Prostate cancer Neg Hx    Bladder Cancer Neg Hx    Kidney cancer Neg Hx      Current Outpatient Medications:    acetaminophen-codeine (TYLENOL #3) 300-30 MG tablet, Take by mouth  every 4 (four) hours as needed for moderate pain., Disp: , Rfl:    ALPRAZolam (XANAX) 0.5 MG tablet, Take 1 tablet (0.5 mg total) by mouth at bedtime as needed for anxiety., Disp: 30 tablet, Rfl: 1   apixaban (ELIQUIS) 5 MG TABS tablet, Take 1 tablet (5 mg total) by mouth 2 (two) times daily., Disp: 60 tablet, Rfl: 3   atorvastatin (LIPITOR) 10 MG tablet, Take 1 tablet (10 mg total) by mouth daily., Disp: 30 tablet, Rfl: 11   Cholecalciferol (VITAMIN D3) 1000 units CAPS, Take 1,000 Units by mouth daily., Disp: , Rfl:    doxycycline (VIBRA-TABS) 100 MG tablet, Take 1 tablet (100 mg total) by mouth 2 (two) times daily., Disp: 14 tablet, Rfl: 0   famotidine (PEPCID) 20 MG tablet, Take 1 tablet (20 mg total) by mouth daily., Disp: 180 tablet, Rfl: 3   fentaNYL (DURAGESIC) 25 MCG/HR, Place 1 patch onto the skin every 3 (three) days., Disp: , Rfl:    finasteride (PROSCAR) 5 MG tablet, Take 1 tablet (5 mg total) by mouth daily., Disp: 90 tablet, Rfl: 3   gabapentin (NEURONTIN) 300 MG capsule, Take 1 capsule (300 mg total) by mouth at bedtime. Do not take if you take temazepam, Disp: 30 capsule, Rfl: 6   hydrochlorothiazide (HYDRODIURIL) 25 MG tablet, Take 0.5 tablets (12.5 mg total) by mouth daily., Disp: , Rfl:    isosorbide dinitrate (ISORDIL) 30 MG tablet, Take 30 mg by mouth daily., Disp: , Rfl:    Magnesium 250 MG TABS, Take 250 mg by mouth daily., Disp: , Rfl:    metoprolol succinate (TOPROL-XL) 25 MG 24 hr tablet, Take 1 tablet (25 mg total) by mouth daily., Disp: 90 tablet, Rfl: 4   mirabegron ER (MYRBETRIQ) 25 MG TB24 tablet, Take 1 tablet (25 mg total) by mouth daily., Disp: 30 tablet, Rfl: 5   Multiple Vitamins-Minerals (VISION FORMULA 2 PO), Take by mouth daily., Disp: , Rfl:    naproxen sodium (ALEVE) 220 MG tablet, Take 220 mg by mouth daily as needed., Disp: , Rfl:    nitrofurantoin, macrocrystal-monohydrate, (MACROBID) 100 MG capsule, Take 1 capsule (100 mg total) by mouth  2 (two) times daily., Disp: 20 capsule, Rfl: 0   ondansetron (ZOFRAN) 8 MG tablet, Take 1 tablet (8  mg total) by mouth 2 (two) times daily as needed for nausea or vomiting., Disp: 20 tablet, Rfl: 3   Oxycodone HCl 10 MG TABS, Take by mouth every 4 (four) hours as needed., Disp: , Rfl:    oxyCODONE-acetaminophen (PERCOCET/ROXICET) 5-325 MG tablet, Take by mouth every 8 (eight) hours as needed for severe pain., Disp: , Rfl:    polyethylene glycol powder (CLEARLAX) 17 GM/SCOOP powder, Take 1 Container by mouth daily., Disp: , Rfl:    prochlorperazine (COMPAZINE) 10 MG tablet, Take 1 tablet (10 mg total) by mouth every 6 (six) hours as needed for nausea or vomiting., Disp: 30 tablet, Rfl: 3   psyllium (METAMUCIL) 58.6 % packet, Take 1 packet by mouth daily., Disp: , Rfl:    traMADol (ULTRAM) 50 MG tablet, Take by mouth every 6 (six) hours as needed. Patient is Unsure of dose., Disp: , Rfl:    zolpidem (AMBIEN) 10 MG tablet, Take 1 tablet (10 mg total) by mouth at bedtime as needed for sleep., Disp: 30 tablet, Rfl: 0  Physical exam: There were no vitals filed for this visit. Physical Exam Constitutional:      Appearance: Normal appearance.  Cardiovascular:     Rate and Rhythm: Normal rate and regular rhythm.     Pulses: Normal pulses.     Heart sounds: Normal heart sounds.  Pulmonary:     Effort: Pulmonary effort is normal.     Breath sounds: Normal breath sounds.  Abdominal:     General: Bowel sounds are normal.     Palpations: Abdomen is soft.  Skin:    General: Skin is warm and dry.  Neurological:     General: No focal deficit present.     Mental Status: He is alert and oriented to person, place, and time.      CMP Latest Ref Rng & Units 04/24/2019  Glucose 70 - 99 mg/dL 153(H)  BUN 8 - 23 mg/dL 29(H)  Creatinine 0.61 - 1.24 mg/dL 1.50(H)  Sodium 135 - 145 mmol/L 129(L)  Potassium 3.5 - 5.1 mmol/L 4.4  Chloride 98 - 111 mmol/L 96(L)  CO2 22 - 32 mmol/L 22  Calcium 8.9 -  10.3 mg/dL 8.2(L)  Total Protein 6.5 - 8.1 g/dL 6.7  Total Bilirubin 0.3 - 1.2 mg/dL 0.3  Alkaline Phos 38 - 126 U/L 70  AST 15 - 41 U/L 23  ALT 0 - 44 U/L 19   CBC Latest Ref Rng & Units 04/24/2019  WBC 4.0 - 10.5 K/uL 3.6(L)  Hemoglobin 13.0 - 17.0 g/dL 8.1(L)  Hematocrit 39.0 - 52.0 % 24.7(L)  Platelets 150 - 400 K/uL 176    No images are attached to the encounter.  No results found.  Assessment and plan- Patient is a 85 y.o. male with high-grade urothelial carcinoma currently on cisplatin and RT who was seen in the ER yesterday due to hematuria and presents today to the clinic due to penile pain.  1.  Urothelial cancer -currently on treatment with cisplatin and RT.  Followed by Dr. Grayland Ormond.  Next appointment Dr. Grayland Ormond is on 9/22.  2.  Hematuria -likely related to bladder cancer and/or radiation.  Symptoms are complicated by use of Eliquis due to recent extensive DVT.  It is also unclear how much Eliquis he is actually taking as nephew says that there are times when he is probably taking more than prescribed.  I called and spoke with the PA in the urology practice for Dr. Erlene Quan and follow-up urology visit  will be scheduled.  3.  Urinary retention -I offered Foley placement but patient declined.  He had a Foley for several months in the past and nephew says that he became so frustrated with it that he pulled out the catheter with the balloon inflated and swore he would never allow another one to be placed.  Nephew feels that patient is competent with intermittent self-catheterization and patient has an adequate supply of those kits in the home.  4.  Penile pain -suspect trauma from self-catheterization.  Patient was unsure if his kits included lubrication and says that he has some discomfort with insertion of the urinary catheter.  He was sent home today with lubrication.  5.  MCI -patient has had worsening confusion per family over the past several months.  Will order TSH, B12, RBC  folate, RPR, and MRI brain WWO contrast to evaluate for organic causes. Brain metastases from urothelial carcinoma would be extremely rare. MME done in the clinic today and patient surprisingly scored 30/30.    6.  Social - I met privately with patient's nephew, who is his POA. Nephew is concerned that patient is having increasing difficulty with self-care in the home.  He is unable to take medications consistently despite nephew filling a pillbox each week.  Nephew says that patient is consulting a 1940s medical text as a reference on if he should or should not be adhering to the medical plan.  Today, we discussed trying to get him more help in the home or patient might eventually require placement in assisted living.  Nephew plans to talk to patient about options.  It sounds like there are financial resources available to obtain whatever help patient might need.  7.  ACP -nephew confirms that patient would not want to be resuscitated or have his life prolonged artificially machines.  I completed a DNR order and sent it home with patient.     Patient expressed understanding and was in agreement with this plan. He also understands that He can call clinic at any time with any questions, concerns, or complaints.   Thank you for allowing me to participate in the care of this very pleasant patient.   Time Total: 45 minutes  Visit consisted of counseling and education dealing with the complex and emotionally intense issues of symptom management and palliative care in the setting of serious and potentially life-threatening illness.Greater than 50%  of this time was spent counseling and coordinating care related to the above assessment and plan.  Signed by: Altha Harm, PhD, NP-C [phone] (Work Cell)

## 2019-04-25 NOTE — ED Notes (Signed)
Pt informed he is going to be discharged home. Pt states "I don't know how i'm going to get there". Pt has an I phone with programmed numbers for emergency. Pt assisted with dialing I phone for ride home.

## 2019-04-25 NOTE — Progress Notes (Signed)
[place]  Telephone:([phone] [phone]   Name: Bryan Nelson Date: 04/25/2019 MRN: [id number]  DOB: 01-05-1932  Patient Care Team: [name], MD as PCP - General (Family Medicine) Dingeldein, Remo Lipps, MD as Consulting Physician (Ophthalmology) Hollice Espy, MD as Consulting Physician (Urology) Yolonda Kida, MD as Consulting Physician (Cardiology) Laneta Simmers as Physician Assistant (Urology)    REASON FOR CONSULTATION: Palliative Care consult requested for this 85 y.o. male with multiple medical problems including history of diffuse large B-cell lymphoma (initially diagnosed August 2015) who is status post 6 cycles of R-CHOP chemotherapy.  Abdominal CT scan February 01, 2019 revealed a 5 cm soft tissue mass along the left pelvic sidewall obstructing the left ureter.  Biopsy revealed high-grade urothelial carcinoma with squamous differentiation.  Patient was referred to palliative care to help address goals.   SOCIAL HISTORY:     reports that he quit smoking about 47 years ago. His smoking use included cigarettes. He has a 18.00 pack-year smoking history. He has never used smokeless tobacco. He reports that he does not drink alcohol or use drugs.   Patient is a widower.  He has no children.  He lives at home alone.  Patient's nephew is involved in his care.  ADVANCE DIRECTIVES:  Patient reports that his nephew is his healthcare power of attorney  CODE STATUS:   PAST MEDICAL HISTORY: Past Medical History:  Diagnosis Date  . Arteriosclerosis of coronary artery 08/26/2011   Overview:      a.  1999 PCI of the mid LAD with stent.      b.  2002 Cath [facility]: EF 56%. RCA-distal 25/25%. Left main-50% ostial.  Left circumflex-25% OM2.  LAD-25% proximal.  75% mid.  25/25% distal. 75% D1.      c.  07/2011 PCI of RCA with DES.[place]   . Bleeding gastrointestinal 08/26/2011   Overview:      a.  Chronic  abdominal pain, present improving.      b.  Duodenitis and gastritis by EGD in 2000.      c.  Pylori in 2000, treated.      d.  Gastroesophageal reflux disease.      e.  Diverticular disease.    . BP (high blood pressure) 08/26/2011  . Closed dislocation of right ankle 09/10/2016   Successfully reduced in ER 09/10/2016  . Diffuse large B-cell lymphoma ([place]) 2015   chemo tx's  . GERD (gastroesophageal reflux disease)   . Heart disease   . Helicobacter pylori infection 06/18/2015   by EGD RX 08/18/1999   . History of adenomatous polyp of colon 06/18/2015  . HOH (hard of hearing)    Bilateral Hearing  Aids  . Malleolar fracture (Right) 09/10/2016    PAST SURGICAL HISTORY:  Past Surgical History:  Procedure Laterality Date  . ANGIOPLASTY    . CATARACT EXTRACTION    . COLONOSCOPY  2016  . CORONARY ANGIOPLASTY    . CYSTOSCOPY WITH STENT PLACEMENT Left 03/17/2019   Procedure: CYSTOSCOPY WITH STENT PLACEMENT;  Surgeon: Festus Aloe, MD;  Location: [facility] OR[facility];  Service: Urology;  Laterality: Left;  . EYE SURGERY Bilateral    Cataract Extraction with IOL  . GREEN LIGHT LASER TURP (TRANSURETHRAL RESECTION OF PROSTATE N/A 04/16/2015   Procedure: GREEN LIGHT LASER TURP (TRANSURETHRAL RESECTION OF PROSTATE WITH BLADDER BIOPSY;  Surgeon: Collier Flowers, MD;  Location: [facility];  Service: Urology;  Laterality: N/A;  .  heart stent placement     1998, 2000, 2012  . PERIPHERAL VASCULAR THROMBECTOMY Left 03/22/2019   Procedure: PERIPHERAL VASCULAR THROMBECTOMY and possible IVC filter;  Surgeon: Algernon Huxley, MD;  Location: [facility] [facility];  Service: Cardiovascular;  Laterality: Left;  . TRANSURETHRAL RESECTION OF PROSTATE N/A 03/02/2017   Procedure: TRANSURETHRAL RESECTION OF THE PROSTATE (TURP);  Surgeon: Hollice Espy, MD;  Location: [facility];  Service: Urology;  Laterality: N/A;    HEMATOLOGY/ONCOLOGY HISTORY:  Oncology History Overview Note   Diffuse Large B Cell Lymphoma: Patient was  initially diagnosed in August 2015, pathology was noted to be anaplastic subtype.  He completed 6 cycles of R-CHOP chemotherapy in January 2016. PET scan January 2017 revealed no evidence of disease.  2.  Urothelial carcinoma: Confirmed by biopsy.  Patient's most recent CT scan on March 01, 2019 reviewed independently and reported as above with progressive left pelvic mass greater than 4 cm.  Patient appears to have no other evidence of disease.  Case discussed with urology.  Given  the fact that patient is symptomatic with pain and has localized disease, he will benefit from concurrent chemotherapy using weekly cisplatin 40 mg/m along with daily XRT.  Delay cycle 1 of cisplatin today secondary to elevated creatinine.  Continue daily XRT.  Return to clinic in 1 week for further evaluation and reconsideration of cycle 2.     DLBCL (diffuse large B cell lymphoma) ([place])  04/10/2014 Initial Diagnosis   DLBCL (diffuse large B cell lymphoma) ([facility])     ALLERGIES:  is allergic to augmentin [amoxicillin-pot clavulanate]; ciprofloxacin; lisinopril; penicillins; bactrim [sulfamethoxazole-trimethoprim]; and sulfa antibiotics.  MEDICATIONS:  Current Outpatient Medications  Medication Sig Dispense Refill  . acetaminophen-codeine (TYLENOL #3) 300-30 MG tablet Take by mouth every 4 (four) hours as needed for moderate pain.    Marland Kitchen ALPRAZolam (XANAX) 0.5 MG tablet Take 1 tablet (0.5 mg total) by mouth at bedtime as needed for anxiety. 30 tablet 1  . apixaban (ELIQUIS) 5 MG TABS tablet Take 1 tablet (5 mg total) by mouth 2 (two) times daily. 60 tablet 3  . atorvastatin (LIPITOR) 10 MG tablet Take 1 tablet (10 mg total) by mouth daily. 30 tablet 11  . Cholecalciferol (VITAMIN D3) 1000 units CAPS Take 1,000 Units by mouth daily.    Marland Kitchen doxycycline (VIBRA-TABS) 100 MG tablet Take 1 tablet (100 mg total) by mouth 2 (two) times daily. 14 tablet 0  . famotidine (PEPCID) 20 MG tablet Take 1 tablet (20 mg total) by mouth daily.  180 tablet 3  . fentaNYL (DURAGESIC) 25 MCG/HR Place 1 patch onto the skin every 3 (three) days.    . finasteride (PROSCAR) 5 MG tablet Take 1 tablet (5 mg total) by mouth daily. 90 tablet 3  . gabapentin (NEURONTIN) 300 MG capsule Take 1 capsule (300 mg total) by mouth at bedtime. Do not take if you take temazepam 30 capsule 6  . hydrochlorothiazide (HYDRODIURIL) 25 MG tablet Take 0.5 tablets (12.5 mg total) by mouth daily.    . isosorbide dinitrate (ISORDIL) 30 MG tablet Take 30 mg by mouth daily.    . Magnesium 250 MG TABS Take 250 mg by mouth daily.    . metoprolol succinate (TOPROL-XL) 25 MG 24 hr tablet Take 1 tablet (25 mg total) by mouth daily. 90 tablet 4  . mirabegron ER (MYRBETRIQ) 25 MG TB24 tablet Take 1 tablet (25 mg total) by mouth daily. 30 tablet 5  . Multiple Vitamins-Minerals (VISION FORMULA 2  PO) Take by mouth daily.    . naproxen sodium (ALEVE) 220 MG tablet Take 220 mg by mouth daily as needed.    . nitrofurantoin, macrocrystal-monohydrate, (MACROBID) 100 MG capsule Take 1 capsule (100 mg total) by mouth 2 (two) times daily. 20 capsule 0  . ondansetron (ZOFRAN) 8 MG tablet Take 1 tablet (8 mg total) by mouth 2 (two) times daily as needed for nausea or vomiting. 20 tablet 3  . Oxycodone HCl 10 MG TABS Take by mouth every 4 (four) hours as needed.    Marland Kitchen oxyCODONE-acetaminophen (PERCOCET/ROXICET) 5-325 MG tablet Take by mouth every 8 (eight) hours as needed for severe pain.    . polyethylene glycol powder (CLEARLAX) 17 GM/SCOOP powder Take 1 Container by mouth daily.    . prochlorperazine (COMPAZINE) 10 MG tablet Take 1 tablet (10 mg total) by mouth every 6 (six) hours as needed for nausea or vomiting. 30 tablet 3  . psyllium (METAMUCIL) 58.6 % packet Take 1 packet by mouth daily.    . traMADol (ULTRAM) 50 MG tablet Take by mouth every 6 (six) hours as needed. Patient is Unsure of dose.    . zolpidem (AMBIEN) 10 MG tablet Take 1 tablet (10 mg total) by mouth at bedtime as needed  for sleep. 30 tablet 0   No current facility-administered medications for this visit.     VITAL SIGNS: There were no vitals taken for this visit. There were no vitals filed for this visit.  Estimated body mass index is 31 kg/m as calculated from the following:   Height as of an earlier encounter on 04/24/19: 6\' 1"  (1.854 m).   Weight as of an earlier encounter on 04/24/19: 235 lb (106.6 kg).  LABS: CBC:    Component Value Date/Time   WBC 3.6 (L) 04/24/2019 2214   HGB 8.1 (L) 04/24/2019 2214   HGB 13.1 08/10/2018 0821   HCT 24.7 (L) 04/24/2019 2214   HCT 38.9 08/10/2018 0821   PLT 176 04/24/2019 2214   PLT 240 08/10/2018 0821   MCV 83.4 04/24/2019 2214   MCV 88 08/10/2018 0821   MCV 91 09/16/2014 0928   NEUTROABS 3.3 04/24/2019 2214   NEUTROABS 2.4 05/07/2016 1055   NEUTROABS 2.3 09/16/2014 0928   LYMPHSABS 0.1 (L) 04/24/2019 2214   LYMPHSABS 0.8 05/07/2016 1055   LYMPHSABS 0.5 (L) 09/16/2014 0928   MONOABS 0.1 04/24/2019 2214   MONOABS 0.7 09/16/2014 0928   EOSABS 0.0 04/24/2019 2214   EOSABS 0.5 (H) 05/07/2016 1055   EOSABS 0.2 09/16/2014 0928   BASOSABS 0.0 04/24/2019 2214   BASOSABS 0.0 05/07/2016 1055   BASOSABS 0.1 09/16/2014 0928   Comprehensive Metabolic Panel:    Component Value Date/Time   NA 129 (L) 04/24/2019 2214   NA 140 11/09/2018 0908   NA 144 09/16/2014 0928   K 4.4 04/24/2019 2214   K 3.5 09/16/2014 0928   CL 96 (L) 04/24/2019 2214   CL 105 09/16/2014 0928   CO2 22 04/24/2019 2214   CO2 31 09/16/2014 0928   BUN 29 (H) 04/24/2019 2214   BUN 19 11/09/2018 0908   BUN 17 09/16/2014 0928   CREATININE 1.50 (H) 04/24/2019 2214   CREATININE 1.01 09/16/2014 0928   GLUCOSE 153 (H) 04/24/2019 2214   GLUCOSE 106 (H) 09/16/2014 0928   CALCIUM 8.2 (L) 04/24/2019 2214   CALCIUM 8.4 (L) 09/16/2014 0928   AST 23 04/24/2019 2214   AST 20 09/16/2014 0928   ALT 19 04/24/2019 2214  ALT 33 09/16/2014 0928   ALKPHOS 70 04/24/2019 2214   ALKPHOS 76 09/16/2014  0928   BILITOT 0.3 04/24/2019 2214   BILITOT 0.3 11/09/2018 0908   BILITOT 0.3 09/16/2014 0928   PROT 6.7 04/24/2019 2214   PROT 6.5 11/09/2018 0908   PROT 6.3 (L) 09/16/2014 0928   ALBUMIN 3.1 (L) 04/24/2019 2214   ALBUMIN 4.3 11/09/2018 0908   ALBUMIN 3.4 09/16/2014 0928    RADIOGRAPHIC STUDIES: No results found.  PERFORMANCE STATUS (ECOG) : 1 - Symptomatic but completely ambulatory  Review of Systems Unless otherwise noted, a complete review of systems is negative.  Physical Exam General: NAD, frail appearing, thin Pulmonary: Unlabored Extremities: no edema Skin: no rashes Neurological: Weakness but otherwise nonfocal  IMPRESSION: Routine follow-up visit today in the clinic.  Patient continues to have some confusion related to medications.  It is unclear what and how often patient is taking medications at home.  Both Dr. Grayland Ormond and I went over his current pain medications in detail.  I labeled his fentanyl patches to facilitate administration.  It sounds like patient had not changed his current patch in over a week.  I appreciate home-based palliative care following.  We are unable to reach nephew by phone today.  I do note that a conversation has occurred with recommendations for sitters in the home.  This would also be my recommendation as I fear that patient living alone at home may be problematic if his cognition further declines.  Will need to readdress MOST form when we can reach nephew  PLAN: -Continue current scope of treatment -oxycodone 10mg  Q4H prn for BTP -Fentanyl TD 37mcg Q72H -RTC in 1 month   Patient expressed understanding and was in agreement with this plan. He also understands that He can call the clinic at any time with any questions, concerns, or complaints.     Time Total: 20 minutes  Visit consisted of counseling and education dealing with the complex and emotionally intense issues of symptom management and palliative care in the setting of  serious and potentially life-threatening illness.Greater than 50%  of this time was spent counseling and coordinating care related to the above assessment and plan.  Signed by: Altha Harm, PhD, NP-C [phone] (Work Cell)

## 2019-04-26 ENCOUNTER — Ambulatory Visit: Payer: PPO

## 2019-04-26 ENCOUNTER — Other Ambulatory Visit: Payer: Self-pay | Admitting: *Deleted

## 2019-04-26 ENCOUNTER — Telehealth: Payer: Self-pay | Admitting: Hospice and Palliative Medicine

## 2019-04-26 DIAGNOSIS — I10 Essential (primary) hypertension: Secondary | ICD-10-CM | POA: Diagnosis not present

## 2019-04-26 DIAGNOSIS — E785 Hyperlipidemia, unspecified: Secondary | ICD-10-CM | POA: Diagnosis not present

## 2019-04-26 DIAGNOSIS — Z9181 History of falling: Secondary | ICD-10-CM | POA: Diagnosis not present

## 2019-04-26 DIAGNOSIS — K219 Gastro-esophageal reflux disease without esophagitis: Secondary | ICD-10-CM | POA: Diagnosis not present

## 2019-04-26 DIAGNOSIS — Z792 Long term (current) use of antibiotics: Secondary | ICD-10-CM | POA: Diagnosis not present

## 2019-04-26 DIAGNOSIS — Z48812 Encounter for surgical aftercare following surgery on the circulatory system: Secondary | ICD-10-CM | POA: Diagnosis not present

## 2019-04-26 DIAGNOSIS — C679 Malignant neoplasm of bladder, unspecified: Secondary | ICD-10-CM | POA: Diagnosis not present

## 2019-04-26 DIAGNOSIS — C8330 Diffuse large B-cell lymphoma, unspecified site: Secondary | ICD-10-CM | POA: Diagnosis not present

## 2019-04-26 DIAGNOSIS — Z87891 Personal history of nicotine dependence: Secondary | ICD-10-CM | POA: Diagnosis not present

## 2019-04-26 DIAGNOSIS — R338 Other retention of urine: Secondary | ICD-10-CM | POA: Diagnosis not present

## 2019-04-26 DIAGNOSIS — I2510 Atherosclerotic heart disease of native coronary artery without angina pectoris: Secondary | ICD-10-CM | POA: Diagnosis not present

## 2019-04-26 DIAGNOSIS — H9193 Unspecified hearing loss, bilateral: Secondary | ICD-10-CM | POA: Diagnosis not present

## 2019-04-26 DIAGNOSIS — E559 Vitamin D deficiency, unspecified: Secondary | ICD-10-CM | POA: Diagnosis not present

## 2019-04-26 DIAGNOSIS — N401 Enlarged prostate with lower urinary tract symptoms: Secondary | ICD-10-CM | POA: Diagnosis not present

## 2019-04-26 DIAGNOSIS — Z48816 Encounter for surgical aftercare following surgery on the genitourinary system: Secondary | ICD-10-CM | POA: Diagnosis not present

## 2019-04-26 DIAGNOSIS — G4700 Insomnia, unspecified: Secondary | ICD-10-CM | POA: Diagnosis not present

## 2019-04-26 DIAGNOSIS — N136 Pyonephrosis: Secondary | ICD-10-CM | POA: Diagnosis not present

## 2019-04-26 DIAGNOSIS — R410 Disorientation, unspecified: Secondary | ICD-10-CM

## 2019-04-26 DIAGNOSIS — R3914 Feeling of incomplete bladder emptying: Secondary | ICD-10-CM | POA: Diagnosis not present

## 2019-04-26 DIAGNOSIS — C7989 Secondary malignant neoplasm of other specified sites: Secondary | ICD-10-CM | POA: Diagnosis not present

## 2019-04-26 DIAGNOSIS — B9620 Unspecified Escherichia coli [E. coli] as the cause of diseases classified elsewhere: Secondary | ICD-10-CM | POA: Diagnosis not present

## 2019-04-26 DIAGNOSIS — T783XXD Angioneurotic edema, subsequent encounter: Secondary | ICD-10-CM | POA: Diagnosis not present

## 2019-04-26 LAB — URINE CULTURE: Culture: <10000 — AB

## 2019-04-26 NOTE — Addendum Note (Signed)
Addended by: Altha Harm R on: 04/26/2019 10:20 AM   Modules accepted: Orders

## 2019-04-27 ENCOUNTER — Other Ambulatory Visit: Payer: Self-pay

## 2019-04-27 ENCOUNTER — Other Ambulatory Visit: Payer: Self-pay | Admitting: Hospice and Palliative Medicine

## 2019-04-27 ENCOUNTER — Inpatient Hospital Stay (HOSPITAL_BASED_OUTPATIENT_CLINIC_OR_DEPARTMENT_OTHER): Payer: PPO | Admitting: Hospice and Palliative Medicine

## 2019-04-27 DIAGNOSIS — Z515 Encounter for palliative care: Secondary | ICD-10-CM

## 2019-04-27 DIAGNOSIS — R410 Disorientation, unspecified: Secondary | ICD-10-CM

## 2019-04-27 DIAGNOSIS — C689 Malignant neoplasm of urinary organ, unspecified: Secondary | ICD-10-CM

## 2019-04-27 NOTE — Progress Notes (Signed)
Virtual Visit via Telephone Note  I connected with Bryan Nelson on 04/27/19 at  1:00 PM EDT by telephone and verified that I am speaking with the correct person using two identifiers.   I discussed the limitations, risks, security and privacy concerns of performing an evaluation and management service by telephone and the availability of in person appointments. I also discussed with the patient that there may be a patient responsible charge related to this service. The patient expressed understanding and agreed to proceed.   History of Present Illness: Palliative Care consult requested for this 85 y.o. male with multiple medical problems including history of diffuse large B-cell lymphoma (initially diagnosed August 2015) who is status post 6 cycles of R-CHOP chemotherapy.  Abdominal CT scan February 01, 2019 revealed a 5 cm soft tissue mass along the left pelvic sidewall obstructing the left ureter.  Biopsy revealed high-grade urothelial carcinoma with squamous differentiation.  Patient was referred to palliative care to help address goals.   Observations/Objective: I had a lengthy phone call with patient and niece.  Together, we reviewed each of patient's medications on the [facility].  Both patient and family want to streamline and discontinue any medications that are no longer necessary or may be exacerbating confusion.  Family are looking into hiring caregivers that can help facilitate medication administration.  Patient denies any acute changes or concerns.  Family feels that he is actually much better today overall.   Assessment and Plan: In an attempt to reduce pill burden and discontinue any medications that might be potentiating confusion will discontinue the following medications:  -Tyelnol #3 -alprazolam -doxycycline -gabapentin  -MV -Naproxen -Percocet -Tramadol -Ambien  Could consider discontination of pepcid and Lipitor if okay with PCP in attempt to further reduce pill burden.    Follow Up Instructions: RTC in 2-3 weeks or sooner if necessary   I discussed the assessment and treatment plan with the patient. The patient was provided an opportunity to ask questions and all were answered. The patient agreed with the plan and demonstrated an understanding of the instructions.   The patient was advised to call back or seek an in-person evaluation if the symptoms worsen or if the condition fails to improve as anticipated.  I provided 60 minutes of non-face-to-face time during this encounter.   Irean Hong, NP

## 2019-04-27 NOTE — Progress Notes (Signed)
Meds reviewed with family. Full note to follow.

## 2019-04-27 NOTE — Telephone Encounter (Signed)
I spoke with patient by phone to review plan for MRI. He was in agreement. He says that he has not had any hematuria today. He has been able to void normally. He still has some penile burning but that could be secondary to trauma from catheterization. He is empirically covered by Macrobid started in the ER.   I also spoke with patient's niece by phone. I updated her on my conversation with patient and nephew. She has also seen worsening confusion and has had some concerns with patient's self care. We discussed options for hiring help in the home. I sent her a list of local personal care agencies.   Case and plan discussed with Dr. Grayland Ormond.   Time Total: 30 minutes  Signed by: Altha Harm, PhD, NP-C [phone] (Work Cell)

## 2019-05-01 ENCOUNTER — Other Ambulatory Visit: Payer: Self-pay | Admitting: *Deleted

## 2019-05-01 ENCOUNTER — Other Ambulatory Visit: Payer: Self-pay | Admitting: Hospice and Palliative Medicine

## 2019-05-01 ENCOUNTER — Telehealth: Payer: Self-pay | Admitting: Hospice and Palliative Medicine

## 2019-05-01 DIAGNOSIS — R109 Unspecified abdominal pain: Secondary | ICD-10-CM

## 2019-05-01 DIAGNOSIS — C689 Malignant neoplasm of urinary organ, unspecified: Secondary | ICD-10-CM

## 2019-05-01 NOTE — Telephone Encounter (Signed)
Spoke with patient's niece by phone.  She reports that patient has had severe and intractable right flank pain over the weekend.  She is concerned that it could be a kidney stone.  No fever or chills reported.  He does have some urinary frequency but that is unchanged from his baseline.  Patient is on Macrobid from his most recent ER visit.  He has known left-sided hydronephrosis with previous stent.  Patient has been taking 1-2 oxycodone tablets, and family report that that has helped the pain without adverse effects.  Case and plan discussed with Dr. Grayland Ormond  Plan: -Stat abdominal CT with and without contrast -Increase oxycodone 10mg  1-2 tablets Q4H prn for pain -Consider increasing fentanyl to 36mcg Q72H -Clinic visit in 1 to 2 days to review results and plan to give IV fluids to flush kidneys and recheck CMP and CBC -Delay MRI of the brain to at least next week

## 2019-05-02 ENCOUNTER — Ambulatory Visit: Payer: PPO

## 2019-05-02 ENCOUNTER — Other Ambulatory Visit: Payer: Self-pay

## 2019-05-02 ENCOUNTER — Telehealth (INDEPENDENT_AMBULATORY_CARE_PROVIDER_SITE_OTHER): Payer: Self-pay

## 2019-05-02 ENCOUNTER — Ambulatory Visit
Admission: RE | Admit: 2019-05-02 | Discharge: 2019-05-02 | Disposition: A | Discharge status: Home / Self Care | Payer: PPO | Source: Ambulatory Visit | Attending: Oncology | Admitting: Oncology

## 2019-05-02 DIAGNOSIS — K5730 Diverticulosis of large intestine without perforation or abscess without bleeding: Secondary | ICD-10-CM | POA: Diagnosis not present

## 2019-05-02 DIAGNOSIS — R109 Unspecified abdominal pain: Secondary | ICD-10-CM | POA: Diagnosis not present

## 2019-05-02 DIAGNOSIS — N281 Cyst of kidney, acquired: Secondary | ICD-10-CM | POA: Diagnosis not present

## 2019-05-02 MED ORDER — IOHEXOL 300 MG/ML  SOLN
75.0000 mL | Freq: Once | INTRAMUSCULAR | Status: AC | PRN
  Administered 2019-05-02: 12:00:00 75 mL via INTRAVENOUS

## 2019-05-02 NOTE — Telephone Encounter (Signed)
Alyson Locket. PT from [facility] had called to report the patient cancel the visit for today due to having a lot of pain due to possible kidney stones

## 2019-05-03 ENCOUNTER — Ambulatory Visit
Admission: RE | Admit: 2019-05-03 | Discharge: 2019-05-03 | Disposition: A | Discharge status: Home / Self Care | Payer: PPO | Source: Ambulatory Visit | Attending: Radiation Oncology | Admitting: Radiation Oncology

## 2019-05-03 ENCOUNTER — Inpatient Hospital Stay: Payer: PPO

## 2019-05-03 ENCOUNTER — Other Ambulatory Visit: Payer: Self-pay

## 2019-05-03 ENCOUNTER — Encounter: Payer: Self-pay | Admitting: Radiation Oncology

## 2019-05-03 ENCOUNTER — Telehealth: Payer: Self-pay | Admitting: Hospice and Palliative Medicine

## 2019-05-03 ENCOUNTER — Other Ambulatory Visit: Payer: Self-pay | Admitting: *Deleted

## 2019-05-03 ENCOUNTER — Inpatient Hospital Stay (HOSPITAL_BASED_OUTPATIENT_CLINIC_OR_DEPARTMENT_OTHER): Payer: PPO | Admitting: Nurse Practitioner

## 2019-05-03 VITALS — BP 168/100 | HR 88 | Temp 98.4°F | Resp 20 | Wt 222.0 lb

## 2019-05-03 DIAGNOSIS — C689 Malignant neoplasm of urinary organ, unspecified: Secondary | ICD-10-CM

## 2019-05-03 DIAGNOSIS — R531 Weakness: Secondary | ICD-10-CM | POA: Diagnosis not present

## 2019-05-03 DIAGNOSIS — K5730 Diverticulosis of large intestine without perforation or abscess without bleeding: Secondary | ICD-10-CM | POA: Diagnosis not present

## 2019-05-03 DIAGNOSIS — C7951 Secondary malignant neoplasm of bone: Secondary | ICD-10-CM | POA: Diagnosis not present

## 2019-05-03 DIAGNOSIS — N281 Cyst of kidney, acquired: Secondary | ICD-10-CM | POA: Diagnosis not present

## 2019-05-03 DIAGNOSIS — N4889 Other specified disorders of penis: Secondary | ICD-10-CM | POA: Diagnosis not present

## 2019-05-03 DIAGNOSIS — Z66 Do not resuscitate: Secondary | ICD-10-CM | POA: Diagnosis not present

## 2019-05-03 DIAGNOSIS — K5903 Drug induced constipation: Secondary | ICD-10-CM | POA: Diagnosis not present

## 2019-05-03 DIAGNOSIS — I714 Abdominal aortic aneurysm, without rupture: Secondary | ICD-10-CM | POA: Diagnosis not present

## 2019-05-03 DIAGNOSIS — R109 Unspecified abdominal pain: Secondary | ICD-10-CM

## 2019-05-03 DIAGNOSIS — R410 Disorientation, unspecified: Secondary | ICD-10-CM | POA: Diagnosis not present

## 2019-05-03 DIAGNOSIS — T402X5A Adverse effect of other opioids, initial encounter: Secondary | ICD-10-CM | POA: Diagnosis not present

## 2019-05-03 DIAGNOSIS — G893 Neoplasm related pain (acute) (chronic): Secondary | ICD-10-CM

## 2019-05-03 DIAGNOSIS — R251 Tremor, unspecified: Secondary | ICD-10-CM | POA: Diagnosis not present

## 2019-05-03 DIAGNOSIS — R1900 Intra-abdominal and pelvic swelling, mass and lump, unspecified site: Secondary | ICD-10-CM | POA: Diagnosis not present

## 2019-05-03 DIAGNOSIS — D649 Anemia, unspecified: Secondary | ICD-10-CM

## 2019-05-03 DIAGNOSIS — N184 Chronic kidney disease, stage 4 (severe): Secondary | ICD-10-CM | POA: Diagnosis not present

## 2019-05-03 DIAGNOSIS — N323 Diverticulum of bladder: Secondary | ICD-10-CM | POA: Diagnosis not present

## 2019-05-03 DIAGNOSIS — C8338 Diffuse large B-cell lymphoma, lymph nodes of multiple sites: Secondary | ICD-10-CM | POA: Diagnosis not present

## 2019-05-03 DIAGNOSIS — M549 Dorsalgia, unspecified: Secondary | ICD-10-CM | POA: Diagnosis not present

## 2019-05-03 DIAGNOSIS — Z510 Encounter for antineoplastic radiation therapy: Secondary | ICD-10-CM | POA: Diagnosis not present

## 2019-05-03 DIAGNOSIS — R300 Dysuria: Secondary | ICD-10-CM | POA: Diagnosis not present

## 2019-05-03 DIAGNOSIS — G4700 Insomnia, unspecified: Secondary | ICD-10-CM | POA: Diagnosis not present

## 2019-05-03 DIAGNOSIS — I2510 Atherosclerotic heart disease of native coronary artery without angina pectoris: Secondary | ICD-10-CM | POA: Diagnosis not present

## 2019-05-03 DIAGNOSIS — N390 Urinary tract infection, site not specified: Secondary | ICD-10-CM | POA: Diagnosis not present

## 2019-05-03 DIAGNOSIS — R7989 Other specified abnormal findings of blood chemistry: Secondary | ICD-10-CM | POA: Diagnosis not present

## 2019-05-03 DIAGNOSIS — N1330 Unspecified hydronephrosis: Secondary | ICD-10-CM | POA: Diagnosis not present

## 2019-05-03 LAB — COMPREHENSIVE METABOLIC PANEL
ALT: 24 U/L (ref 0–44)
AST: 17 U/L (ref 15–41)
Albumin: 3.1 g/dL — ABNORMAL LOW (ref 3.5–5.0)
Alkaline Phosphatase: 73 U/L (ref 38–126)
Anion gap: 11 (ref 5–15)
BUN: 40 mg/dL — ABNORMAL HIGH (ref 8–23)
CO2: 24 mmol/L (ref 22–32)
Calcium: 8.2 mg/dL — ABNORMAL LOW (ref 8.9–10.3)
Chloride: 97 mmol/L — ABNORMAL LOW (ref 98–111)
Creatinine, Ser: 1.85 mg/dL — ABNORMAL HIGH (ref 0.61–1.24)
GFR calc Af Amer: 38 mL/min — ABNORMAL LOW (ref >60)
GFR calc non Af Amer: 32 mL/min — ABNORMAL LOW (ref >60)
Glucose, Bld: 119 mg/dL — ABNORMAL HIGH (ref 70–99)
Potassium: 3.4 mmol/L — ABNORMAL LOW (ref 3.5–5.1)
Sodium: 132 mmol/L — ABNORMAL LOW (ref 135–145)
Total Bilirubin: 0.4 mg/dL (ref 0.3–1.2)
Total Protein: 6.5 g/dL (ref 6.5–8.1)

## 2019-05-03 LAB — CBC WITH DIFFERENTIAL/PLATELET
Abs Immature Granulocytes: 0.04 10*3/uL (ref 0.00–0.07)
Basophils Absolute: 0 10*3/uL (ref 0.0–0.1)
Basophils Relative: 0 %
Eosinophils Absolute: 0 10*3/uL (ref 0.0–0.5)
Eosinophils Relative: 0 %
HCT: 21.9 % — ABNORMAL LOW (ref 39.0–52.0)
Hemoglobin: 7.1 g/dL — ABNORMAL LOW (ref 13.0–17.0)
Immature Granulocytes: 1 %
Lymphocytes Relative: 2 %
Lymphs Abs: 0.2 10*3/uL — ABNORMAL LOW (ref 0.7–4.0)
MCH: 27.5 pg (ref 26.0–34.0)
MCHC: 32.4 g/dL (ref 30.0–36.0)
MCV: 84.9 fL (ref 80.0–100.0)
Monocytes Absolute: 0.4 10*3/uL (ref 0.1–1.0)
Monocytes Relative: 6 %
Neutro Abs: 5.7 10*3/uL (ref 1.7–7.7)
Neutrophils Relative %: 91 %
Platelets: 93 10*3/uL — ABNORMAL LOW (ref 150–400)
RBC: 2.58 MIL/uL — ABNORMAL LOW (ref 4.22–5.81)
RDW: 16.4 % — ABNORMAL HIGH (ref 11.5–15.5)
WBC: 6.3 10*3/uL (ref 4.0–10.5)
nRBC: 0 % (ref 0.0–0.2)

## 2019-05-03 MED ORDER — MORPHINE SULFATE 4 MG/ML IJ SOLN
4.0000 mg | Freq: Once | INTRAMUSCULAR | Status: AC
  Administered 2019-05-03: 4 mg via INTRAVENOUS
  Filled 2019-05-03: qty 1

## 2019-05-03 MED ORDER — DEXAMETHASONE SODIUM PHOSPHATE 10 MG/ML IJ SOLN
INTRAMUSCULAR | Status: AC
  Filled 2019-05-03: qty 1

## 2019-05-03 MED ORDER — DEXAMETHASONE 4 MG PO TABS: 8.0000 mg | ORAL_TABLET | Freq: Every day | ORAL | 0 refills | Status: DC

## 2019-05-03 MED ORDER — MORPHINE SULFATE (PF) 2 MG/ML IV SOLN
INTRAVENOUS | Status: AC
  Filled 2019-05-03: qty 2

## 2019-05-03 MED ORDER — SODIUM CHLORIDE 0.9 % IV SOLN: 10.0000 mg | Freq: Once | INTRAVENOUS | Status: DC

## 2019-05-03 MED ORDER — DEXAMETHASONE SODIUM PHOSPHATE 10 MG/ML IJ SOLN
10.0000 mg | Freq: Once | INTRAMUSCULAR | Status: AC
  Administered 2019-05-03: 10 mg via INTRAVENOUS

## 2019-05-03 MED ORDER — SODIUM CHLORIDE 0.9 % IV SOLN
Freq: Once | INTRAVENOUS | Status: AC
  Administered 2019-05-03: 12:00:00 via INTRAVENOUS
  Filled 2019-05-03: qty 250

## 2019-05-03 MED ORDER — FENTANYL 50 MCG/HR TD PT72: 1.0000 | MEDICATED_PATCH | TRANSDERMAL | 0 refills | Status: DC

## 2019-05-03 NOTE — Telephone Encounter (Signed)
Patient called on-call oncologist overnight due to intractable pain.  I spoke with his niece by phone this morning.  He continues to have severe back pain.  I reviewed with her the results of the CT scan showing a new T12 lesion.  Case discussed with Dr. Grayland Ormond and Dr. Baruch Gouty. Will bring him in to the clinic for labs, IVF, and pain meds. Will also have him evaluated for consideration of RT.

## 2019-05-03 NOTE — Patient Instructions (Signed)
.   Please review the attached list of medications and notify my office if there are any errors.   . Please bring all of your medications to every appointment so we can make sure that our medication list is the same as yours.   . It is especially important to get the annual flu vaccine this year. If you haven't had it already, please go to your pharmacy or call the office as soon as possible to schedule you flu shot.  

## 2019-05-03 NOTE — Progress Notes (Signed)
Radiation Oncology Follow up Note  Name: Bryan Nelson   Date:   05/03/2019 MRN:  [id number] DOB: 12/02/1931    This 85 y.o. male presents to the clinic today for evaluation of T12 metastatic lesion causing destruction and a significant pain and patient with known B-cell lymphoma as well as high-grade urothelial carcinoma.  REFERRING PROVIDER: [name], MD  HPI: Patient is a 85 year old male well-known to our department having been treated with palliative radiation therapy for high-grade urothelial carcinoma causing pain and discomfort in his pelvic region.  He is been seen in the emergency room with increasing back pain.  Recent CT scan demonstrated.  A lytic osseous metastatic focus at T12 posterior elements which was essentially new.  I was asked by symptom management to evaluate for possible palliative radiation therapy.  Patient is seen today.  He is in significant pain is yet not yet seen by symptom management.  He is ambulating well without problems.  COMPLICATIONS OF TREATMENT: none  FOLLOW UP COMPLIANCE: keeps appointments   PHYSICAL EXAM:  BP (!) 168/100   Pulse 88   Temp 98.4 F (36.9 C)   Resp 20   Wt 222 lb (100.7 kg)   BMI 29.29 kg/m  Motor or sensory and DTR levels are equal and symmetric in lower extremities.  Proprioception is intact.  Well-developed well-nourished patient in NAD. HEENT reveals PERLA, EOMI, discs not visualized.  Oral cavity is clear. No oral mucosal lesions are identified. Neck is clear without evidence of cervical or supraclavicular adenopathy. Lungs are clear to A&P. Cardiac examination is essentially unremarkable with regular rate and rhythm without murmur rub or thrill. Abdomen is benign with no organomegaly or masses noted. Motor sensory and DTR levels are equal and symmetric in the upper and lower extremities. Cranial nerves II through XII are grossly intact. Proprioception is intact. No peripheral adenopathy or edema is identified. No motor  or sensory levels are noted. Crude visual fields are within normal range.  RADIOLOGY RESULTS: CT scans reviewed compatible with above-stated findings  PLAN: At this time elected ahead with palliative radiation therapy to his T12 region.  Would plan on delivering 3000 cGy in 10 fractions.  Risks and benefits of treatment occluding possible skin reaction fatigue possible onset of diarrhea all were discussed in detail.  Based on his significant pain I have opted to simulate him today and will start treatment early next week.  Patient comprehends my treatment plan well.  I would like to take this opportunity to thank you for allowing me to participate in the care of your patient.Noreene Filbert, MD

## 2019-05-03 NOTE — Progress Notes (Signed)
[facility]pt[facility] [place]  [phone] [phone]  Patient Care Team: [name], MD as PCP - General (Family Medicine) Dingeldein, Remo Lipps, MD as Consulting Physician (Ophthalmology) Hollice Espy, MD as Consulting Physician (Urology) Yolonda Kida, MD as Consulting Physician (Cardiology) Laneta Simmers as Physician Assistant (Urology)   Name of the patient: Bryan Nelson  [id number]  03/30/1932   Date of visit: 05/03/19  Diagnosis-diffuse large B-cell lymphoma & left pelvic urothelial carcinoma  Chief complaint/ Reason for visit- Pain associated with metastatic malignancy  Oncology History:  1.  Urothelial carcinoma-confirmed by biopsy.  CT scan in July showed progressive left pelvic mass greater than 4 cm.  Symptomatic with pain.  Recommendation was for concurrent chemotherapy with cisplatin and daily radiation.  2.  History of diffuse large B-cell lymphoma  Interval history-Bryan Nelson, 85 year old male with above history of diffuse large B-cell lymphoma, presents to symptom management clinic for reports of intractable pain.  He localizes pain to right flank/back, described as 10 of 10/severe pain.  Currently on Macrobid from recent ER visit.  Urinary frequency at baseline.  Known left-sided hydronephrosis from previous stent.  Has been taking 1-2 oxycodone tablets which have not been significantly improved pain.  Pain worsened by any movement.  Due to symptoms, he was sent for CT scan on 05/02/2019 by Dr. Adin Hector which demonstrated destructive lytic osseous metastasis in the right T12 measuring 3.7 x 2.2 x 4 cm, that was new, and chronic mild to moderate T12 vertebral compression fracture, and likely source of pain. Denies loss of bowel or bladder function.   ECOG FS:2 - Symptomatic, <50% confined to bed  Review of systems- Review of Systems  Constitutional: Positive for malaise/fatigue. Negative for  chills, fever and weight loss.  Respiratory: Negative for cough and shortness of breath.   Cardiovascular: Negative for chest pain and palpitations.  Gastrointestinal: Negative for abdominal pain, constipation, diarrhea and vomiting.  Genitourinary: Negative for dysuria and flank pain.  Musculoskeletal: Positive for back pain. Negative for falls.  Neurological: Negative for dizziness and weakness.  Psychiatric/Behavioral: Negative for depression. The patient has insomnia. The patient is not nervous/anxious.      Current treatment-cisplatin-radiation  Allergies  Allergen Reactions   Augmentin [Amoxicillin-Pot Clavulanate]     Tongue swelling 2 days after taking   Ciprofloxacin Diarrhea   Lisinopril Other (See Comments)    Unknown    Penicillins Swelling    unknwon reaction.  tolerates amoxicillin Has patient had a PCN reaction causing immediate rash, facial/tongue/throat swelling, SOB or lightheadedness with hypotension: No Has patient had a PCN reaction causing severe rash involving mucus membranes or skin necrosis: No Has patient had a PCN reaction that required hospitalization: No Has patient had a PCN reaction occurring within the last 10 years: Yes If all of the above answers are "NO", then may proceed with Cephalosporin use.    Bactrim [Sulfamethoxazole-Trimethoprim] Rash   Sulfa Antibiotics Itching and Rash    Past Medical History:  Diagnosis Date   Arteriosclerosis of coronary artery 08/26/2011   Overview:      a.  1999 PCI of the mid LAD with stent.      b.  2002 Cath [place]: EF 56%. RCA-distal 25/25%. Left main-50% ostial.  Left circumflex-25% OM2.  LAD-25% proximal.  75% mid.  25/25% distal. 75% D1.      c.  07/2011 PCI of RCA with DES.[place]    Bleeding gastrointestinal 08/26/2011   Overview:  a.  Chronic abdominal pain, present improving.      b.  Duodenitis and gastritis by EGD in 2000.      c.  Pylori in 2000, treated.      d.  Gastroesophageal reflux  disease.      e.  Diverticular disease.     BP (high blood pressure) 08/26/2011   Closed dislocation of right ankle 09/10/2016   Successfully reduced in ER 09/10/2016   Diffuse large B-cell lymphoma ([place]) 2015   chemo tx's   GERD (gastroesophageal reflux disease)    Heart disease    Helicobacter pylori infection 06/18/2015   by EGD RX 08/18/1999    History of adenomatous polyp of colon 06/18/2015   HOH (hard of hearing)    Bilateral Hearing  Aids   Malleolar fracture (Right) 09/10/2016    Past Surgical History:  Procedure Laterality Date   ANGIOPLASTY     CATARACT EXTRACTION     COLONOSCOPY  2016   CORONARY ANGIOPLASTY     CYSTOSCOPY WITH STENT PLACEMENT Left 03/17/2019   Procedure: CYSTOSCOPY WITH STENT PLACEMENT;  Surgeon: Festus Aloe, MD;  Location: [facility];  Service: Urology;  Laterality: Left;   EYE SURGERY Bilateral    Cataract Extraction with IOL   GREEN LIGHT LASER TURP (TRANSURETHRAL RESECTION OF PROSTATE N/A 04/16/2015   Procedure: GREEN LIGHT LASER TURP (TRANSURETHRAL RESECTION OF PROSTATE WITH BLADDER BIOPSY;  Surgeon: Collier Flowers, MD;  Location: [facility];  Service: Urology;  Laterality: N/A;   heart stent placement     1998, 2000, 2012   PERIPHERAL VASCULAR THROMBECTOMY Left 03/22/2019   Procedure: PERIPHERAL VASCULAR THROMBECTOMY and possible IVC filter;  Surgeon: Algernon Huxley, MD;  Location: [place] [facility];  Service: Cardiovascular;  Laterality: Left;   TRANSURETHRAL RESECTION OF PROSTATE N/A 03/02/2017   Procedure: TRANSURETHRAL RESECTION OF THE PROSTATE (TURP);  Surgeon: Hollice Espy, MD;  Location: [facility];  Service: Urology;  Laterality: N/A;    Social History   Socioeconomic History   Marital status: Widowed    Spouse name: Not on file   Number of children: 0   Years of education: Not on file   Highest education level: 12th grade  Occupational History   Occupation: retired  Scientist, product/process development strain:  Not very hard   Food insecurity    Worry: Never true    Inability: Never true   Transportation needs    Medical: No    Non-medical: No  Tobacco Use   Smoking status: Former Smoker    Packs/day: 1.50    Years: 12.00    Pack years: 18.00    Types: Cigarettes    Quit date: 08/24/1971    Years since quitting: 47.7   Smokeless tobacco: Never Used  Substance and Sexual Activity   Alcohol use: No    Alcohol/week: 0.0 standard drinks   Drug use: No   Sexual activity: Not on file  Lifestyle   Physical activity    Days per week: 0 days    Minutes per session: 0 min   Stress: Not at all  Relationships   Social connections    Talks on phone: Patient refused    Gets together: Patient refused    Attends religious service: Patient refused    Active member of club or organization: Patient refused    Attends meetings of clubs or organizations: Patient refused    Relationship status: Patient refused   Intimate partner violence  Fear of current or ex partner: Patient refused    Emotionally abused: Patient refused    Physically abused: Patient refused    Forced sexual activity: Patient refused  Other Topics Concern   Not on file  Social History Narrative   Not on file    Family History  Problem Relation Age of Onset   Osteoporosis Mother    Alzheimer's disease Father    Kidney disease Neg Hx    Prostate cancer Neg Hx    Bladder Cancer Neg Hx    Kidney cancer Neg Hx      Current Outpatient Medications:    apixaban (ELIQUIS) 5 MG TABS tablet, Take 1 tablet (5 mg total) by mouth 2 (two) times daily., Disp: 60 tablet, Rfl: 3   atorvastatin (LIPITOR) 10 MG tablet, Take 1 tablet (10 mg total) by mouth daily., Disp: 30 tablet, Rfl: 11   Cholecalciferol (VITAMIN D3) 1000 units CAPS, Take 1,000 Units by mouth daily., Disp: , Rfl:    famotidine (PEPCID) 20 MG tablet, Take 1 tablet (20 mg total) by mouth daily., Disp: 180 tablet, Rfl: 3   fentaNYL (DURAGESIC) 25  MCG/HR, Place 1 patch onto the skin every 3 (three) days., Disp: , Rfl:    finasteride (PROSCAR) 5 MG tablet, Take 1 tablet (5 mg total) by mouth daily., Disp: 90 tablet, Rfl: 3   hydrochlorothiazide (HYDRODIURIL) 25 MG tablet, Take 0.5 tablets (12.5 mg total) by mouth daily., Disp: , Rfl:    isosorbide dinitrate (ISORDIL) 30 MG tablet, Take 30 mg by mouth daily., Disp: , Rfl:    Magnesium 250 MG TABS, Take 250 mg by mouth daily., Disp: , Rfl:    metoprolol succinate (TOPROL-XL) 25 MG 24 hr tablet, Take 1 tablet (25 mg total) by mouth daily., Disp: 90 tablet, Rfl: 4   mirabegron ER (MYRBETRIQ) 25 MG TB24 tablet, Take 1 tablet (25 mg total) by mouth daily., Disp: 30 tablet, Rfl: 5   nitrofurantoin, macrocrystal-monohydrate, (MACROBID) 100 MG capsule, Take 1 capsule (100 mg total) by mouth 2 (two) times daily., Disp: 20 capsule, Rfl: 0   ondansetron (ZOFRAN) 8 MG tablet, Take 1 tablet (8 mg total) by mouth 2 (two) times daily as needed for nausea or vomiting., Disp: 20 tablet, Rfl: 3   Oxycodone HCl 10 MG TABS, Take by mouth every 4 (four) hours as needed., Disp: , Rfl:    polyethylene glycol powder (CLEARLAX) 17 GM/SCOOP powder, Take 1 Container by mouth daily., Disp: , Rfl:    prochlorperazine (COMPAZINE) 10 MG tablet, Take 1 tablet (10 mg total) by mouth every 6 (six) hours as needed for nausea or vomiting., Disp: 30 tablet, Rfl: 3   psyllium (METAMUCIL) 58.6 % packet, Take 1 packet by mouth daily., Disp: , Rfl:   Physical exam: There were no vitals filed for this visit. Physical Exam Constitutional:      General: He is in acute distress.     Comments: Painful appearing- in infusion suite, in recliner  Cardiovascular:     Rate and Rhythm: Regular rhythm. Tachycardia present.  Pulmonary:     Effort: Pulmonary effort is normal.     Breath sounds: Normal breath sounds.  Abdominal:     General: There is no distension.     Tenderness: There is no abdominal tenderness. There is no  right CVA tenderness or left CVA tenderness.  Musculoskeletal:        General: Tenderness (@ T12) present. No deformity.  Skin:    General: Skin  is warm and dry.  Neurological:     Mental Status: He is oriented to person, place, and time.  Psychiatric:        Mood and Affect: Mood normal.        Behavior: Behavior normal.      CMP Latest Ref Rng & Units 04/24/2019  Glucose 70 - 99 mg/dL 153(H)  BUN 8 - 23 mg/dL 29(H)  Creatinine 0.61 - 1.24 mg/dL 1.50(H)  Sodium 135 - 145 mmol/L 129(L)  Potassium 3.5 - 5.1 mmol/L 4.4  Chloride 98 - 111 mmol/L 96(L)  CO2 22 - 32 mmol/L 22  Calcium 8.9 - 10.3 mg/dL 8.2(L)  Total Protein 6.5 - 8.1 g/dL 6.7  Total Bilirubin 0.3 - 1.2 mg/dL 0.3  Alkaline Phos 38 - 126 U/L 70  AST 15 - 41 U/L 23  ALT 0 - 44 U/L 19   CBC Latest Ref Rng & Units 04/24/2019  WBC 4.0 - 10.5 K/uL 3.6(L)  Hemoglobin 13.0 - 17.0 g/dL 8.1(L)  Hematocrit 39.0 - 52.0 % 24.7(L)  Platelets 150 - 400 K/uL 176    No images are attached to the encounter.  Ct Abdomen Pelvis W Contrast  Result Date: 05/02/2019 CLINICAL DATA:  Abdominal pain. Left pelvic urothelial carcinoma status post concurrent chemotherapy and radiation therapy. Restaging. Additional history of diffuse large B-cell lymphoma diagnosed in 2015 status post chemotherapy. EXAM: CT ABDOMEN AND PELVIS WITH CONTRAST TECHNIQUE: Multidetector CT imaging of the abdomen and pelvis was performed using the standard protocol following bolus administration of intravenous contrast. CONTRAST:  73mL OMNIPAQUE IOHEXOL 300 MG/ML  SOLN COMPARISON:  03/01/2019 CT abdomen/pelvis. FINDINGS: Lower chest: No significant pulmonary nodules or acute consolidative airspace disease. Coronary atherosclerosis. Hepatobiliary: Normal liver size. No liver mass. Normal gallbladder with no radiopaque cholelithiasis. No biliary ductal dilatation. Pancreas: Normal, with no mass or duct dilation. Spleen: Normal size. No mass. Adrenals/Urinary Tract: Normal  adrenals. Left nephroureteral stent is well positioned with the proximal pigtail portion in the upper left renal collecting system and the distal pigtail portion in posterior bladder lumen. No hydronephrosis. Simple bilateral renal cysts, largest 4.9 cm in the posterior upper right kidney. There is an infiltrative heterogeneously enhancing 5.0 x 4.5 cm upper left pelvic mass encasing the upper left pelvic ureter and partially encasing the proximal left external iliac vein with associated new left common and external iliac vein stent (series 2/image 67), previously 5.2 x 4.5 cm using similar measurement technique, not substantially changed. This mass is also intimately associated with the superior margin of a left posterior bladder diverticulum. Relatively collapsed bladder. No bladder stones. Stomach/Bowel: Normal non-distended stomach. Normal caliber small bowel with no small bowel wall thickening. Normal appendix. Oral contrast transits to the colon. Moderate left colonic diverticulosis, with no large bowel wall thickening or acute pericolonic fat stranding. Vascular/Lymphatic: Atherosclerotic abdominal aorta with 3.1 cm infrarenal abdominal aortic aneurysm, stable. Patent portal, splenic, hepatic and renal veins. No pathologically enlarged lymph nodes in the abdomen or pelvis. Reproductive: Top-normal size prostate. Other: No pneumoperitoneum, ascites or focal fluid collection. Musculoskeletal: There is a mildly expansile destructive lytic osseous lesion in the posterior right T12 elements (series 2/image 19), essentially new, measuring approximately 3.7 x 2.2 x 4.0 cm. Moderate thoracolumbar spondylosis. Chronic mild-to-moderate T12 vertebral compression fracture. IMPRESSION: 1. Destructive lytic osseous metastasis in the right T12 posterior elements, essentially new. 2. Locally infiltrative left pelvic tumor encasing the upper left pelvic ureter has not substantially changed in size. 3. Well-positioned left  nephroureteral stent  with no residual left hydronephrosis. 4. Infrarenal 3.1 cm Abdominal Aortic Aneurysm (ICD10-I71.9). Recommend follow-up aortic ultrasound in 3 years. This recommendation follows ACR consensus guidelines: White Paper of the ACR Incidental Findings Committee II on Vascular Findings. J Am Coll Radiol 2013; 59:977-414. 5.  Aortic Atherosclerosis (ICD10-I70.0). Electronically Signed   By: Ilona Sorrel M.D.   On: 05/02/2019 12:36    Assessment and plan- Patient is a 85 y.o. male currently receiving treatment for high-grade urothelial carcinoma, who presents to symptom management clinic for back pain.  1.  Pain secondary to metastatic malignancy-imaging showed lytic lesion at T12 with associated compression fracture.  Significant pain in clinic today.  Has seen Dr. Baruch Gouty to consider palliative radiation with plan to start next week. Morphine in clinic today IV with significant improvement in pain.  Increase transdermal fentanyl to 50 mcg/72 hours.  Continue oxycodone 1-2 tablets every 4 hours as needed for breakthrough pain.  Start dexamethasone- 10 mg IV in clinic today followed by 8mg  daily for 5 days.  Discussed risks of opioid-induced constipation and advised to continue a bowel prophylaxis with goal of bowel movement every to every other day.  Follow-up with Dr. Baruch Gouty for initiation of palliative radiation.   2. Osseous metastasis-Dr. Grayland Ormond recommend starting Zometa.  Consider oral calcium and vitamin D in the future.  3.  Chemotherapy-induced anemia-hemoglobin 7.1.  Likely secondary to chemotherapy.  Given that he is symptomatic, we will plan for transfusion of 1 unit of PRBCs tomorrow.  Disposition:  rtc tomorrow for blood transfusion. Follow up for pain with Billey Chang, NP outpatient palliative care, at that time.   Visit Diagnosis 1. Symptomatic anemia   2. Pain due to malignant neoplasm metastatic to bone (HCC)   3. Osseous metastasis ([place])    Patient expressed  understanding and was in agreement with this plan. He also understands that He can call clinic at any time with any questions, concerns, or complaints.   Thank you for allowing me to participate in the care of this very pleasant patient.   Visit consisted of counseling and education dealing with the complex and emotionally intense issues of symptom management and palliative care in the setting of serious and potentially life-threatening illness. Greater than 50%  of this time was spent counseling and coordinating care related to the above assessment and plan.  Signed by: Beckey Rutter, DNP, AGNP-C [place] [facility] [phone] (work cell) [phone] (office)

## 2019-05-03 NOTE — Progress Notes (Signed)
Nurse visit only to recheck u/a. Not seen by MD Results for orders placed or performed in visit on 04/20/19  Result Value Ref Range   Color, UA     Clarity, UA     Glucose, UA Negative Negative   Bilirubin, UA Negative    Ketones, UA Negative    Spec Grav, UA 1.010 1.010 - 1.025   Blood, UA Large    pH, UA 6.0 5.0 - 8.0   Protein, UA Positive (A) Negative   Urobilinogen, UA 0.2 0.2 or 1.0 E.U./dL   Nitrite, UA Positive    Leukocytes, UA Large (3+) (A) Negative   Appearance     Odor     1. Recurrent UTI  - CULTURE, URINE COMPREHENSIVE  - fosfomycin (MONUROL) 3 g PACK; Take 3 g by mouth once for 1 dose.  Dispense: 3 g; Refill: 0

## 2019-05-04 ENCOUNTER — Inpatient Hospital Stay: Payer: PPO

## 2019-05-04 ENCOUNTER — Other Ambulatory Visit: Payer: Self-pay | Admitting: Oncology

## 2019-05-04 ENCOUNTER — Other Ambulatory Visit: Payer: Self-pay

## 2019-05-04 ENCOUNTER — Other Ambulatory Visit: Payer: Self-pay | Admitting: Nurse Practitioner

## 2019-05-04 ENCOUNTER — Inpatient Hospital Stay (HOSPITAL_BASED_OUTPATIENT_CLINIC_OR_DEPARTMENT_OTHER): Payer: PPO | Admitting: Hospice and Palliative Medicine

## 2019-05-04 VITALS — BP 118/56 | HR 81 | Temp 96.4°F | Resp 18

## 2019-05-04 DIAGNOSIS — C689 Malignant neoplasm of urinary organ, unspecified: Secondary | ICD-10-CM

## 2019-05-04 DIAGNOSIS — D649 Anemia, unspecified: Secondary | ICD-10-CM

## 2019-05-04 DIAGNOSIS — R339 Retention of urine, unspecified: Secondary | ICD-10-CM

## 2019-05-04 DIAGNOSIS — G893 Neoplasm related pain (acute) (chronic): Secondary | ICD-10-CM | POA: Diagnosis not present

## 2019-05-04 DIAGNOSIS — C7951 Secondary malignant neoplasm of bone: Secondary | ICD-10-CM | POA: Diagnosis not present

## 2019-05-04 LAB — CBC WITH DIFFERENTIAL/PLATELET
Abs Immature Granulocytes: 0.05 10*3/uL (ref 0.00–0.07)
Basophils Absolute: 0 10*3/uL (ref 0.0–0.1)
Basophils Relative: 0 %
Eosinophils Absolute: 0 10*3/uL (ref 0.0–0.5)
Eosinophils Relative: 0 %
HCT: 23.7 % — ABNORMAL LOW (ref 39.0–52.0)
Hemoglobin: 7.7 g/dL — ABNORMAL LOW (ref 13.0–17.0)
Immature Granulocytes: 1 %
Lymphocytes Relative: 5 %
Lymphs Abs: 0.3 10*3/uL — ABNORMAL LOW (ref 0.7–4.0)
MCH: 27.6 pg (ref 26.0–34.0)
MCHC: 32.5 g/dL (ref 30.0–36.0)
MCV: 84.9 fL (ref 80.0–100.0)
Monocytes Absolute: 0.5 10*3/uL (ref 0.1–1.0)
Monocytes Relative: 7 %
Neutro Abs: 6 10*3/uL (ref 1.7–7.7)
Neutrophils Relative %: 87 %
Platelets: 111 10*3/uL — ABNORMAL LOW (ref 150–400)
RBC: 2.79 MIL/uL — ABNORMAL LOW (ref 4.22–5.81)
RDW: 16.7 % — ABNORMAL HIGH (ref 11.5–15.5)
WBC: 7 10*3/uL (ref 4.0–10.5)
nRBC: 0 % (ref 0.0–0.2)

## 2019-05-04 LAB — PREPARE RBC (CROSSMATCH)

## 2019-05-04 LAB — ABO/RH: ABO/RH(D): O POS

## 2019-05-04 MED ORDER — ACETAMINOPHEN 325 MG PO TABS
650.0000 mg | ORAL_TABLET | Freq: Once | ORAL | Status: AC
  Administered 2019-05-04: 12:00:00 650 mg via ORAL
  Filled 2019-05-04: qty 2

## 2019-05-04 MED ORDER — SODIUM CHLORIDE 0.9% IV SOLUTION
250.0000 mL | Freq: Once | INTRAVENOUS | Status: AC
  Administered 2019-05-04: 10:00:00 250 mL via INTRAVENOUS
  Filled 2019-05-04: qty 250

## 2019-05-04 MED ORDER — SODIUM CHLORIDE 0.9 % IV SOLN
Freq: Once | INTRAVENOUS | Status: AC
  Administered 2019-05-04: 14:00:00 via INTRAVENOUS
  Filled 2019-05-04: qty 250

## 2019-05-04 MED ORDER — DIPHENHYDRAMINE HCL 25 MG PO CAPS
25.0000 mg | ORAL_CAPSULE | Freq: Once | ORAL | Status: AC
  Administered 2019-05-04: 25 mg via ORAL
  Filled 2019-05-04: qty 1

## 2019-05-04 MED ORDER — ZOLEDRONIC ACID 4 MG/5ML IV CONC
3.3000 mg | Freq: Once | INTRAVENOUS | Status: AC
  Administered 2019-05-04: 15:00:00 3.3 mg via INTRAVENOUS
  Filled 2019-05-04: qty 4.13

## 2019-05-04 MED ORDER — MORPHINE SULFATE 2 MG/ML IJ SOLN
4.0000 mg | Freq: Once | INTRAMUSCULAR | Status: AC
  Administered 2019-05-04: 10:00:00 4 mg via INTRAVENOUS
  Filled 2019-05-04: qty 2
  Filled 2019-05-04: qty 1

## 2019-05-04 NOTE — Progress Notes (Signed)
Symptom Management [place]  [phone] Fax:(336) [phone]  Patient Care Team: [name], MD as PCP - General (Family Medicine) Dingeldein, Remo Lipps, MD as Consulting Physician (Ophthalmology) Hollice Espy, MD as Consulting Physician (Urology) Yolonda Kida, MD as Consulting Physician (Cardiology) Laneta Simmers as Physician Assistant (Urology)   Name of the patient: Bryan Nelson  [id number]  May 22, 1932   Date of visit: 05/04/19  Diagnosis-  high-grade urothelial carcinoma   Chief complaint/ Reason for visit- back pain  Heme/Onc history:  Oncology History Overview Note   Diffuse Large B Cell Lymphoma: Patient was initially diagnosed in August 2015, pathology was noted to be anaplastic subtype.  Bryan Nelson completed 6 cycles of R-CHOP chemotherapy in January 2016. PET scan January 2017 revealed no evidence of disease.  2.  Urothelial carcinoma: Confirmed by biopsy.  Patient's most recent CT scan on March 01, 2019 reviewed independently and reported as above with progressive left pelvic mass greater than 4 cm.  Patient appears to have no other evidence of disease.  Case discussed with urology.  Given  the fact that patient is symptomatic with pain and has localized disease, Bryan Nelson will benefit from concurrent chemotherapy using weekly cisplatin 40 mg/m along with daily XRT.  Delay cycle 1 of cisplatin today secondary to elevated creatinine.  Continue daily XRT.  Return to clinic in 1 week for further evaluation and reconsideration of cycle 2.     DLBCL (diffuse large B cell lymphoma) ([place])  04/10/2014 Initial Diagnosis   DLBCL (diffuse large B cell lymphoma) ([facility])     Interval history-patient was seen in the clinic yesterday and had complaints of pain but has been noncompliant with pain medications.  Bryan Nelson presented last night to the ER with complaints of hematuria thought likely secondary to effect from radiation.  It sounds as if Bryan Nelson  was having some difficulty voiding and there was question of possible need for Foley.  Today Bryan Nelson presents to the clinic with complaints of penile pain.  UA was checked but analysis was obscured by turbidity and discoloration.  Patient has been self catheterizing and draining large clots.  Denies any neurologic complaints. Denies recent fevers or illnesses. Denies any easy bleeding or bruising. Reports good appetite and denies weight loss. Denies chest pain. Denies any nausea, vomiting, constipation, or diarrhea. Denies urinary complaints. Patient offers no further specific complaints today.  ECOG FS:2 - Symptomatic, <50% confined to bed  Review of systems- Review of Systems  Constitutional: Negative for chills, fever, malaise/fatigue and weight loss.  Respiratory: Negative for cough.   Genitourinary: Positive for frequency. Negative for dysuria, flank pain, hematuria and urgency.  Musculoskeletal: Positive for back pain. Negative for falls.  Neurological: Negative for dizziness and weakness.     Current treatment-cisplatin and RT  Allergies  Allergen Reactions   Augmentin [Amoxicillin-Pot Clavulanate]     Tongue swelling 2 days after taking   Ciprofloxacin Diarrhea   Lisinopril Other (See Comments)    Unknown    Penicillins Swelling    unknwon reaction.  tolerates amoxicillin Has patient had a PCN reaction causing immediate rash, facial/tongue/throat swelling, SOB or lightheadedness with hypotension: No Has patient had a PCN reaction causing severe rash involving mucus membranes or skin necrosis: No Has patient had a PCN reaction that required hospitalization: No Has patient had a PCN reaction occurring within the last 10 years: Yes If all of the above answers are "NO", then may proceed with Cephalosporin use.  Bactrim [Sulfamethoxazole-Trimethoprim] Rash   Sulfa Antibiotics Itching and Rash    Past Medical History:  Diagnosis Date   Arteriosclerosis of coronary artery  08/26/2011   Overview:      a.  1999 PCI of the mid LAD with stent.      b.  2002 Cath [place]: EF 56%. RCA-distal 25/25%. Left main-50% ostial.  Left circumflex-25% OM2.  LAD-25% proximal.  75% mid.  25/25% distal. 75% D1.      c.  07/2011 PCI of RCA with DES.[place]    Bleeding gastrointestinal 08/26/2011   Overview:      a.  Chronic abdominal pain, present improving.      b.  Duodenitis and gastritis by EGD in 2000.      c.  Pylori in 2000, treated.      d.  Gastroesophageal reflux disease.      e.  Diverticular disease.     BP (high blood pressure) 08/26/2011   Closed dislocation of right ankle 09/10/2016   Successfully reduced in ER 09/10/2016   Diffuse large B-cell lymphoma ([place]) 2015   chemo tx's   GERD (gastroesophageal reflux disease)    Heart disease    Helicobacter pylori infection 06/18/2015   by EGD RX 08/18/1999    History of adenomatous polyp of colon 06/18/2015   HOH (hard of hearing)    Bilateral Hearing  Aids   Malleolar fracture (Right) 09/10/2016    Past Surgical History:  Procedure Laterality Date   ANGIOPLASTY     CATARACT EXTRACTION     COLONOSCOPY  2016   CORONARY ANGIOPLASTY     CYSTOSCOPY WITH STENT PLACEMENT Left 03/17/2019   Procedure: CYSTOSCOPY WITH STENT PLACEMENT;  Surgeon: Festus Aloe, MD;  Location: [facility];  Service: Urology;  Laterality: Left;   EYE SURGERY Bilateral    Cataract Extraction with IOL   GREEN LIGHT LASER TURP (TRANSURETHRAL RESECTION OF PROSTATE N/A 04/16/2015   Procedure: GREEN LIGHT LASER TURP (TRANSURETHRAL RESECTION OF PROSTATE WITH BLADDER BIOPSY;  Surgeon: Collier Flowers, MD;  Location: [facility];  Service: Urology;  Laterality: N/A;   heart stent placement     1998, 2000, 2012   PERIPHERAL VASCULAR THROMBECTOMY Left 03/22/2019   Procedure: PERIPHERAL VASCULAR THROMBECTOMY and possible IVC filter;  Surgeon: Algernon Huxley, MD;  Location: [place] [facility];  Service: Cardiovascular;  Laterality: Left;    TRANSURETHRAL RESECTION OF PROSTATE N/A 03/02/2017   Procedure: TRANSURETHRAL RESECTION OF THE PROSTATE (TURP);  Surgeon: Hollice Espy, MD;  Location: [facility];  Service: Urology;  Laterality: N/A;    Social History   Socioeconomic History   Marital status: Widowed    Spouse name: Not on file   Number of children: 0   Years of education: Not on file   Highest education level: 12th grade  Occupational History   Occupation: retired  Scientist, 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   Food insecurity    Worry: Never true    Inability: Never true   Transportation needs    Medical: No    Non-medical: No  Tobacco Use   Smoking status: Former Smoker    Packs/day: 1.50    Years: 12.00    Pack years: 18.00    Types: Cigarettes    Quit date: 08/24/1971    Years since quitting: 47.7   Smokeless tobacco: Never Used  Substance and Sexual Activity   Alcohol use: No    Alcohol/week: 0.0 standard drinks   Drug  use: No   Sexual activity: Not on file  Lifestyle   Physical activity    Days per week: 0 days    Minutes per session: 0 min   Stress: Not at all  Relationships   Social connections    Talks on phone: Patient refused    Gets together: Patient refused    Attends religious service: Patient refused    Active member of club or organization: Patient refused    Attends meetings of clubs or organizations: Patient refused    Relationship status: Patient refused   Intimate partner violence    Fear of current or ex partner: Patient refused    Emotionally abused: Patient refused    Physically abused: Patient refused    Forced sexual activity: Patient refused  Other Topics Concern   Not on file  Social History Narrative   Not on file    Family History  Problem Relation Age of Onset   Osteoporosis Mother    Alzheimer's disease Father    Kidney disease Neg Hx    Prostate cancer Neg Hx    Bladder Cancer Neg Hx    Kidney cancer Neg Hx       Current Outpatient Medications:    apixaban (ELIQUIS) 5 MG TABS tablet, Take 1 tablet (5 mg total) by mouth 2 (two) times daily., Disp: 60 tablet, Rfl: 3   atorvastatin (LIPITOR) 10 MG tablet, Take 1 tablet (10 mg total) by mouth daily., Disp: 30 tablet, Rfl: 11   Cholecalciferol (VITAMIN D3) 1000 units CAPS, Take 1,000 Units by mouth daily., Disp: , Rfl:    dexamethasone (DECADRON) 4 MG tablet, Take 2 tablets (8 mg total) by mouth daily for 5 days., Disp: 10 tablet, Rfl: 0   famotidine (PEPCID) 20 MG tablet, Take 1 tablet (20 mg total) by mouth daily., Disp: 180 tablet, Rfl: 3   fentaNYL (DURAGESIC) 50 MCG/HR, Place 1 patch onto the skin every 3 (three) days., Disp: 5 patch, Rfl: 0   finasteride (PROSCAR) 5 MG tablet, Take 1 tablet (5 mg total) by mouth daily., Disp: 90 tablet, Rfl: 3   hydrochlorothiazide (HYDRODIURIL) 25 MG tablet, Take 0.5 tablets (12.5 mg total) by mouth daily., Disp: , Rfl:    isosorbide dinitrate (ISORDIL) 30 MG tablet, Take 30 mg by mouth daily., Disp: , Rfl:    Magnesium 250 MG TABS, Take 250 mg by mouth daily., Disp: , Rfl:    metoprolol succinate (TOPROL-XL) 25 MG 24 hr tablet, Take 1 tablet (25 mg total) by mouth daily., Disp: 90 tablet, Rfl: 4   mirabegron ER (MYRBETRIQ) 25 MG TB24 tablet, Take 1 tablet (25 mg total) by mouth daily., Disp: 30 tablet, Rfl: 5   nitrofurantoin, macrocrystal-monohydrate, (MACROBID) 100 MG capsule, Take 1 capsule (100 mg total) by mouth 2 (two) times daily., Disp: 20 capsule, Rfl: 0   ondansetron (ZOFRAN) 8 MG tablet, Take 1 tablet (8 mg total) by mouth 2 (two) times daily as needed for nausea or vomiting., Disp: 20 tablet, Rfl: 3   Oxycodone HCl 10 MG TABS, Take by mouth every 4 (four) hours as needed., Disp: , Rfl:    polyethylene glycol powder (CLEARLAX) 17 GM/SCOOP powder, Take 1 Container by mouth daily., Disp: , Rfl:    prochlorperazine (COMPAZINE) 10 MG tablet, Take 1 tablet (10 mg total) by mouth every 6  (six) hours as needed for nausea or vomiting., Disp: 30 tablet, Rfl: 3   psyllium (METAMUCIL) 58.6 % packet, Take 1 packet by mouth daily.,  Disp: , Rfl:  No current facility-administered medications for this visit.   Facility-Administered Medications Ordered in Other Visits:    acetaminophen (TYLENOL) tablet 650 mg, 650 mg, Oral, Once, Verlon Au, NP   diphenhydrAMINE (BENADRYL) capsule 25 mg, 25 mg, Oral, Once, Verlon Au, NP  Physical exam: There were no vitals filed for this visit. Physical Exam Constitutional:      Appearance: Normal appearance.  Cardiovascular:     Rate and Rhythm: Normal rate and regular rhythm.     Pulses: Normal pulses.     Heart sounds: Normal heart sounds.  Pulmonary:     Effort: Pulmonary effort is normal.     Breath sounds: Normal breath sounds.  Abdominal:     General: Bowel sounds are normal.     Palpations: Abdomen is soft.  Skin:    General: Skin is warm and dry.  Neurological:     General: No focal deficit present.     Mental Status: Bryan Nelson is alert and oriented to person, place, and time.      CMP Latest Ref Rng & Units 05/03/2019  Glucose 70 - 99 mg/dL 119(H)  BUN 8 - 23 mg/dL 40(H)  Creatinine 0.61 - 1.24 mg/dL 1.85(H)  Sodium 135 - 145 mmol/L 132(L)  Potassium 3.5 - 5.1 mmol/L 3.4(L)  Chloride 98 - 111 mmol/L 97(L)  CO2 22 - 32 mmol/L 24  Calcium 8.9 - 10.3 mg/dL 8.2(L)  Total Protein 6.5 - 8.1 g/dL 6.5  Total Bilirubin 0.3 - 1.2 mg/dL 0.4  Alkaline Phos 38 - 126 U/L 73  AST 15 - 41 U/L 17  ALT 0 - 44 U/L 24   CBC Latest Ref Rng & Units 05/04/2019  WBC 4.0 - 10.5 K/uL 7.0  Hemoglobin 13.0 - 17.0 g/dL 7.7(L)  Hematocrit 39.0 - 52.0 % 23.7(L)  Platelets 150 - 400 K/uL 111(L)    No images are attached to the encounter.  Ct Abdomen Pelvis W Contrast  Result Date: 05/02/2019 CLINICAL DATA:  Abdominal pain. Left pelvic urothelial carcinoma status post concurrent chemotherapy and radiation therapy. Restaging. Additional  history of diffuse large B-cell lymphoma diagnosed in 2015 status post chemotherapy. EXAM: CT ABDOMEN AND PELVIS WITH CONTRAST TECHNIQUE: Multidetector CT imaging of the abdomen and pelvis was performed using the standard protocol following bolus administration of intravenous contrast. CONTRAST:  79m OMNIPAQUE IOHEXOL 300 MG/ML  SOLN COMPARISON:  03/01/2019 CT abdomen/pelvis. FINDINGS: Lower chest: No significant pulmonary nodules or acute consolidative airspace disease. Coronary atherosclerosis. Hepatobiliary: Normal liver size. No liver mass. Normal gallbladder with no radiopaque cholelithiasis. No biliary ductal dilatation. Pancreas: Normal, with no mass or duct dilation. Spleen: Normal size. No mass. Adrenals/Urinary Tract: Normal adrenals. Left nephroureteral stent is well positioned with the proximal pigtail portion in the upper left renal collecting system and the distal pigtail portion in posterior bladder lumen. No hydronephrosis. Simple bilateral renal cysts, largest 4.9 cm in the posterior upper right kidney. There is an infiltrative heterogeneously enhancing 5.0 x 4.5 cm upper left pelvic mass encasing the upper left pelvic ureter and partially encasing the proximal left external iliac vein with associated new left common and external iliac vein stent (series 2/image 67), previously 5.2 x 4.5 cm using similar measurement technique, not substantially changed. This mass is also intimately associated with the superior margin of a left posterior bladder diverticulum. Relatively collapsed bladder. No bladder stones. Stomach/Bowel: Normal non-distended stomach. Normal caliber small bowel with no small bowel wall thickening. Normal appendix. Oral contrast transits  to the colon. Moderate left colonic diverticulosis, with no large bowel wall thickening or acute pericolonic fat stranding. Vascular/Lymphatic: Atherosclerotic abdominal aorta with 3.1 cm infrarenal abdominal aortic aneurysm, stable. Patent portal,  splenic, hepatic and renal veins. No pathologically enlarged lymph nodes in the abdomen or pelvis. Reproductive: Top-normal size prostate. Other: No pneumoperitoneum, ascites or focal fluid collection. Musculoskeletal: There is a mildly expansile destructive lytic osseous lesion in the posterior right T12 elements (series 2/image 19), essentially new, measuring approximately 3.7 x 2.2 x 4.0 cm. Moderate thoracolumbar spondylosis. Chronic mild-to-moderate T12 vertebral compression fracture. IMPRESSION: 1. Destructive lytic osseous metastasis in the right T12 posterior elements, essentially new. 2. Locally infiltrative left pelvic tumor encasing the upper left pelvic ureter has not substantially changed in size. 3. Well-positioned left nephroureteral stent with no residual left hydronephrosis. 4. Infrarenal 3.1 cm Abdominal Aortic Aneurysm (ICD10-I71.9). Recommend follow-up aortic ultrasound in 3 years. This recommendation follows ACR consensus guidelines: White Paper of the ACR Incidental Findings Committee II on Vascular Findings. J Am Coll Radiol 2013; 44:628-638. 5.  Aortic Atherosclerosis (ICD10-I70.0). Electronically Signed   By: Ilona Sorrel M.D.   On: 05/02/2019 12:36    Assessment and plan- Patient is a 85 y.o. male with high-grade urothelial carcinoma currently on cisplatin and RT who was seen in the ER yesterday due to hematuria and presents today to the clinic due to penile pain.  1.  Urothelial cancer - Bryan Nelson is s/p cisplatin and RT.  Found to have new T12 lesion on CT. Patient is pending initiation of RT. Followed by Dr. Grayland Ormond and Dr. Baruch Gouty.    2.  Back pain-secondary to T12 met.  Patient is pending initiation of RT next week.  Pain is generally improved today.  Bryan Nelson is currently rating pain 5-7 out of 10.  Bryan Nelson did receive dose of morphine in the infusion area.  Transdermal fentanyl was increased yesterday to 50 mcg every 72 hours and should just now be reaching Cmax.  Continue use of oxycodone as  needed for BTP. Continue dexamethasone. Continue prophylactic bowel regimen. We will pursue Zometa dose, which may also help with pain management. Could also consider trial of calcitonin if needed.   Called and discussed weekend pain management with niece, Jocelyn Lamer.  In the past 24 hours, patient has only needed one dose of oxycodone but took two tablets (9m). Discussed use of oxycodone every three hours as needed (1-2 tablets). I also recommended starting scheduled acetaminophen [id number].   3.  Acute on chronic anemia -Bryan Nelson is refusing 1 unit packed red blood cells today.  Follow CBC.   4. Dispo -RTC next week  Case and plan discussed with Dr. FiGrayland Ormond  Patient expressed understanding and was in agreement with this plan. Bryan Nelson also understands that Bryan Nelson can call clinic at any time with any questions, concerns, or complaints.   Thank you for allowing me to participate in the care of this very pleasant patient.   Time Total: 25 minutes  Visit consisted of counseling and education dealing with the complex and emotionally intense issues of symptom management and palliative care in the setting of serious and potentially life-threatening illness.Greater than 50%  of this time was spent counseling and coordinating care related to the above assessment and plan.  Signed by: JoAltha HarmPhD, NP-C [phone] Cell)

## 2019-05-04 NOTE — Progress Notes (Signed)
Per Elvera Maria NP, pt to receive Zometa today. Per Almyra Free CMA per Dr. Grayland Ormond okay to proceed with calcium of 8.2.   pt reports having blood in urine last night along with Frequency and burning when urinating. pt states this has improved and is not experiencing it at this time. Lindi Adie NP aware. NNO at this time, pt educated to monitor symptoms per NP orders and to call clinic if pt has any concerns or questions, or to go to ER if it is an emergency. Pt verbalizes understanding.  Pt stable at discharge.

## 2019-05-05 ENCOUNTER — Other Ambulatory Visit: Payer: Self-pay

## 2019-05-05 ENCOUNTER — Emergency Department
Admission: EM | Admit: 2019-05-05 | Discharge: 2019-05-05 | Disposition: A | Discharge status: Home / Self Care | Payer: PPO | Attending: Emergency Medicine | Admitting: Emergency Medicine

## 2019-05-05 ENCOUNTER — Emergency Department: Payer: PPO

## 2019-05-05 DIAGNOSIS — Z5321 Procedure and treatment not carried out due to patient leaving prior to being seen by health care provider: Secondary | ICD-10-CM | POA: Insufficient documentation

## 2019-05-05 DIAGNOSIS — R109 Unspecified abdominal pain: Secondary | ICD-10-CM | POA: Diagnosis not present

## 2019-05-05 DIAGNOSIS — K5900 Constipation, unspecified: Secondary | ICD-10-CM | POA: Diagnosis not present

## 2019-05-05 LAB — COMPREHENSIVE METABOLIC PANEL
ALT: 29 U/L (ref 0–44)
AST: 26 U/L (ref 15–41)
Albumin: 3.5 g/dL (ref 3.5–5.0)
Alkaline Phosphatase: 76 U/L (ref 38–126)
Anion gap: 17 — ABNORMAL HIGH (ref 5–15)
BUN: 52 mg/dL — ABNORMAL HIGH (ref 8–23)
CO2: 21 mmol/L — ABNORMAL LOW (ref 22–32)
Calcium: 8.6 mg/dL — ABNORMAL LOW (ref 8.9–10.3)
Chloride: 97 mmol/L — ABNORMAL LOW (ref 98–111)
Creatinine, Ser: 2.44 mg/dL — ABNORMAL HIGH (ref 0.61–1.24)
GFR calc Af Amer: 27 mL/min — ABNORMAL LOW (ref >60)
GFR calc non Af Amer: 23 mL/min — ABNORMAL LOW (ref >60)
Glucose, Bld: 177 mg/dL — ABNORMAL HIGH (ref 70–99)
Potassium: 3.8 mmol/L (ref 3.5–5.1)
Sodium: 135 mmol/L (ref 135–145)
Total Bilirubin: 0.4 mg/dL (ref 0.3–1.2)
Total Protein: 7.1 g/dL (ref 6.5–8.1)

## 2019-05-05 LAB — TYPE AND SCREEN
ABO/RH(D): O POS
ABO/RH(D): O POS
Antibody Screen: NEGATIVE
Antibody Screen: NEGATIVE
Unit division: 0

## 2019-05-05 LAB — BPAM RBC
Blood Product Expiration Date: 202009122359
ISSUE DATE / TIME: 202009111216
Unit Type and Rh: 9500

## 2019-05-05 LAB — CBC WITH DIFFERENTIAL/PLATELET
Abs Immature Granulocytes: 0.04 10*3/uL (ref 0.00–0.07)
Basophils Absolute: 0 10*3/uL (ref 0.0–0.1)
Basophils Relative: 0 %
Eosinophils Absolute: 0 10*3/uL (ref 0.0–0.5)
Eosinophils Relative: 0 %
HCT: 28.6 % — ABNORMAL LOW (ref 39.0–52.0)
Hemoglobin: 9.4 g/dL — ABNORMAL LOW (ref 13.0–17.0)
Immature Granulocytes: 1 %
Lymphocytes Relative: 5 %
Lymphs Abs: 0.3 10*3/uL — ABNORMAL LOW (ref 0.7–4.0)
MCH: 28.1 pg (ref 26.0–34.0)
MCHC: 32.9 g/dL (ref 30.0–36.0)
MCV: 85.6 fL (ref 80.0–100.0)
Monocytes Absolute: 0.3 10*3/uL (ref 0.1–1.0)
Monocytes Relative: 6 %
Neutro Abs: 5.2 10*3/uL (ref 1.7–7.7)
Neutrophils Relative %: 88 %
Platelets: 147 10*3/uL — ABNORMAL LOW (ref 150–400)
RBC: 3.34 MIL/uL — ABNORMAL LOW (ref 4.22–5.81)
RDW: 16.9 % — ABNORMAL HIGH (ref 11.5–15.5)
WBC: 5.8 10*3/uL (ref 4.0–10.5)
nRBC: 0 % (ref 0.0–0.2)

## 2019-05-05 NOTE — ED Triage Notes (Signed)
Pt states he is constipated, pt states he has not had a bowel movement in 2 days. Pt is receiving treatment for bladder cancer. Pt's niece states pt did have blood in last bowel movement and no hemorrhoids noted per pt. Niece states pt did have a blood transfusion yesterday.

## 2019-05-07 ENCOUNTER — Inpatient Hospital Stay (HOSPITAL_BASED_OUTPATIENT_CLINIC_OR_DEPARTMENT_OTHER): Payer: PPO | Admitting: Hospice and Palliative Medicine

## 2019-05-07 ENCOUNTER — Inpatient Hospital Stay: Payer: PPO

## 2019-05-07 ENCOUNTER — Other Ambulatory Visit: Payer: Self-pay

## 2019-05-07 ENCOUNTER — Other Ambulatory Visit: Payer: Self-pay | Admitting: *Deleted

## 2019-05-07 ENCOUNTER — Ambulatory Visit: Payer: PPO

## 2019-05-07 VITALS — BP 130/65 | HR 102 | Temp 98.1°F | Resp 18 | Wt 223.0 lb

## 2019-05-07 DIAGNOSIS — E860 Dehydration: Secondary | ICD-10-CM

## 2019-05-07 DIAGNOSIS — G4700 Insomnia, unspecified: Secondary | ICD-10-CM

## 2019-05-07 DIAGNOSIS — C7951 Secondary malignant neoplasm of bone: Secondary | ICD-10-CM

## 2019-05-07 DIAGNOSIS — K5903 Drug induced constipation: Secondary | ICD-10-CM | POA: Diagnosis not present

## 2019-05-07 DIAGNOSIS — Z95828 Presence of other vascular implants and grafts: Secondary | ICD-10-CM

## 2019-05-07 DIAGNOSIS — N3001 Acute cystitis with hematuria: Secondary | ICD-10-CM

## 2019-05-07 DIAGNOSIS — R300 Dysuria: Secondary | ICD-10-CM

## 2019-05-07 DIAGNOSIS — G893 Neoplasm related pain (acute) (chronic): Secondary | ICD-10-CM | POA: Diagnosis not present

## 2019-05-07 DIAGNOSIS — C689 Malignant neoplasm of urinary organ, unspecified: Secondary | ICD-10-CM

## 2019-05-07 DIAGNOSIS — Z510 Encounter for antineoplastic radiation therapy: Secondary | ICD-10-CM | POA: Diagnosis not present

## 2019-05-07 LAB — CBC WITH DIFFERENTIAL/PLATELET
Abs Immature Granulocytes: 0.06 10*3/uL (ref 0.00–0.07)
Basophils Absolute: 0 10*3/uL (ref 0.0–0.1)
Basophils Relative: 0 %
Eosinophils Absolute: 0 10*3/uL (ref 0.0–0.5)
Eosinophils Relative: 0 %
HCT: 27.9 % — ABNORMAL LOW (ref 39.0–52.0)
Hemoglobin: 9 g/dL — ABNORMAL LOW (ref 13.0–17.0)
Immature Granulocytes: 1 %
Lymphocytes Relative: 3 %
Lymphs Abs: 0.2 10*3/uL — ABNORMAL LOW (ref 0.7–4.0)
MCH: 28.3 pg (ref 26.0–34.0)
MCHC: 32.3 g/dL (ref 30.0–36.0)
MCV: 87.7 fL (ref 80.0–100.0)
Monocytes Absolute: 0.2 10*3/uL (ref 0.1–1.0)
Monocytes Relative: 3 %
Neutro Abs: 5.9 10*3/uL (ref 1.7–7.7)
Neutrophils Relative %: 93 %
Platelets: 151 10*3/uL (ref 150–400)
RBC: 3.18 MIL/uL — ABNORMAL LOW (ref 4.22–5.81)
RDW: 17.6 % — ABNORMAL HIGH (ref 11.5–15.5)
WBC: 6.3 10*3/uL (ref 4.0–10.5)
nRBC: 0 % (ref 0.0–0.2)

## 2019-05-07 LAB — URINALYSIS, COMPLETE (UACMP) WITH MICROSCOPIC
Bilirubin Urine: NEGATIVE
Glucose, UA: NEGATIVE mg/dL
Ketones, ur: NEGATIVE mg/dL
Nitrite: POSITIVE — AB
Protein, ur: 30 mg/dL — AB
Specific Gravity, Urine: 1.013 (ref 1.005–1.030)
Squamous Epithelial / HPF: NONE SEEN (ref 0–5)
WBC, UA: >50 WBC/hpf — ABNORMAL HIGH (ref 0–5)
pH: 7 (ref 5.0–8.0)

## 2019-05-07 LAB — COMPREHENSIVE METABOLIC PANEL
ALT: 26 U/L (ref 0–44)
AST: 21 U/L (ref 15–41)
Albumin: 3.4 g/dL — ABNORMAL LOW (ref 3.5–5.0)
Alkaline Phosphatase: 78 U/L (ref 38–126)
Anion gap: 13 (ref 5–15)
BUN: 47 mg/dL — ABNORMAL HIGH (ref 8–23)
CO2: 24 mmol/L (ref 22–32)
Calcium: 8.2 mg/dL — ABNORMAL LOW (ref 8.9–10.3)
Chloride: 102 mmol/L (ref 98–111)
Creatinine, Ser: 2.04 mg/dL — ABNORMAL HIGH (ref 0.61–1.24)
GFR calc Af Amer: 33 mL/min — ABNORMAL LOW (ref >60)
GFR calc non Af Amer: 29 mL/min — ABNORMAL LOW (ref >60)
Glucose, Bld: 144 mg/dL — ABNORMAL HIGH (ref 70–99)
Potassium: 3.2 mmol/L — ABNORMAL LOW (ref 3.5–5.1)
Sodium: 139 mmol/L (ref 135–145)
Total Bilirubin: 0.3 mg/dL (ref 0.3–1.2)
Total Protein: 7.2 g/dL (ref 6.5–8.1)

## 2019-05-07 MED ORDER — TRAZODONE HCL 50 MG PO TABS: 25.0000 mg | ORAL_TABLET | Freq: Every day | ORAL | 0 refills | Status: DC

## 2019-05-07 MED ORDER — DEXAMETHASONE 4 MG PO TABS: 4.0000 mg | ORAL_TABLET | Freq: Every day | ORAL | 0 refills | Status: AC

## 2019-05-07 MED ORDER — SODIUM CHLORIDE 0.9% FLUSH
10.0000 mL | Freq: Once | INTRAVENOUS | Status: AC
  Filled 2019-05-07: qty 10

## 2019-05-07 MED ORDER — SENNA 8.6 MG PO TABS: 1.0000 | ORAL_TABLET | Freq: Every day | ORAL | 0 refills | Status: AC | PRN

## 2019-05-07 MED ORDER — POTASSIUM CHLORIDE CRYS ER 20 MEQ PO TBCR: 20.0000 meq | EXTENDED_RELEASE_TABLET | Freq: Every day | ORAL | 0 refills | Status: AC

## 2019-05-07 MED ORDER — SODIUM CHLORIDE 0.9 % IV SOLN
Freq: Once | INTRAVENOUS | Status: AC
  Administered 2019-05-07: 14:00:00 via INTRAVENOUS
  Filled 2019-05-07: qty 250

## 2019-05-07 MED ORDER — OXYCODONE HCL 10 MG PO TABS: 10.0000 mg | ORAL_TABLET | ORAL | 0 refills | Status: AC | PRN

## 2019-05-07 MED ORDER — OXYCODONE HCL 5 MG PO TABS
10.0000 mg | ORAL_TABLET | Freq: Once | ORAL | Status: AC
  Administered 2019-05-07: 10 mg via ORAL
  Filled 2019-05-07: qty 2

## 2019-05-07 NOTE — Progress Notes (Signed)
[facility] [place]  Telephone:([phone]) [phone] Fax:(336) [phone]  Patient Care Team: [name], MD as PCP - General (Family Medicine) Dingeldein, Remo Lipps, MD as Consulting Physician (Ophthalmology) Hollice Espy, MD as Consulting Physician (Urology) Yolonda Kida, MD as Consulting Physician (Cardiology) Laneta Simmers as Physician Assistant (Urology)   Name of the patient: Bryan Nelson  [id number]  1931-11-13   Date of visit: 05/07/19  Diagnosis-  high-grade urothelial carcinoma   Chief complaint/ Reason for visit- back pain  Heme/Onc history:  Oncology History Overview Note   Diffuse Large B Cell Lymphoma: Patient was initially diagnosed in August 2015, pathology was noted to be anaplastic subtype.  He completed 6 cycles of R-CHOP chemotherapy in January 2016. PET scan January 2017 revealed no evidence of disease.  2.  Urothelial carcinoma: Confirmed by biopsy.  Patient's most recent CT scan on March 01, 2019 reviewed independently and reported as above with progressive left pelvic mass greater than 4 cm.  Patient appears to have no other evidence of disease.  Case discussed with urology.  Given  the fact that patient is symptomatic with pain and has localized disease, he will benefit from concurrent chemotherapy using weekly cisplatin 40 mg/m along with daily XRT.  Delay cycle 1 of cisplatin today secondary to elevated creatinine.  Continue daily XRT.  Return to clinic in 1 week for further evaluation and reconsideration of cycle 2.     DLBCL (diffuse large B cell lymphoma) ([place])  04/10/2014 Initial Diagnosis   DLBCL (diffuse large B cell lymphoma) ([facility])     Interval history-patient was seen in the ER over the weekend due to constipation.  He sat in the ER for several hours and then had a spontaneous bowel movement without intervention and then subsequently eloped.  Patient was seen in the clinic today for follow-up from ER  visit.  He reports continued back pain and is pending initiation of RT tomorrow.  Patient continues to take oxycodone 1 to 2 tablets (managed by niece) every 4 hours around-the-clock.  Patient complains of urinary burning, urgency, and frequency but denies fever or chills.  Symptoms are chronic and patient recently completed a course of Macrobid.  Patient also endorses insomnia and requests a medication to help him initiate and maintain sleep.  Denies any neurologic complaints. Denies recent fevers or illnesses. Denies any easy bleeding or bruising. Reports good appetite and denies weight loss. Denies chest pain. Denies any nausea, vomiting, constipation, or diarrhea. Denies urinary complaints. Patient offers no further specific complaints today.  ECOG FS:2 - Symptomatic, <50% confined to bed  Review of systems- Review of Systems  Constitutional: Negative for chills, fever, malaise/fatigue and weight loss.  Respiratory: Negative for cough.   Genitourinary: Positive for dysuria, frequency and urgency. Negative for flank pain and hematuria.  Musculoskeletal: Positive for back pain. Negative for falls.  Neurological: Negative for dizziness and weakness.     Current treatment-cisplatin and RT  Allergies  Allergen Reactions   Augmentin [Amoxicillin-Pot Clavulanate]     Tongue swelling 2 days after taking   Ciprofloxacin Diarrhea   Lisinopril Other (See Comments)    Unknown    Penicillins Swelling    unknwon reaction.  tolerates amoxicillin Has patient had a PCN reaction causing immediate rash, facial/tongue/throat swelling, SOB or lightheadedness with hypotension: No Has patient had a PCN reaction causing severe rash involving mucus membranes or skin necrosis: No Has patient had a PCN reaction that required hospitalization: No Has  patient had a PCN reaction occurring within the last 10 years: Yes If all of the above answers are "NO", then may proceed with Cephalosporin use.    Bactrim  [Sulfamethoxazole-Trimethoprim] Rash   Sulfa Antibiotics Itching and Rash    Past Medical History:  Diagnosis Date   Arteriosclerosis of coronary artery 08/26/2011   Overview:      a.  1999 PCI of the mid LAD with stent.      b.  2002 [facility] [place]: EF 56%. RCA-distal 25/25%. Left main-50% ostial.  Left circumflex-25% OM2.  LAD-25% proximal.  75% mid.  25/25% distal. 75% D1.      c.  07/2011 PCI of RCA with DES.[place]    Bleeding gastrointestinal 08/26/2011   Overview:      a.  Chronic abdominal pain, present improving.      b.  Duodenitis and gastritis by EGD in 2000.      c.  Pylori in 2000, treated.      d.  Gastroesophageal reflux disease.      e.  Diverticular disease.     BP (high blood pressure) 08/26/2011   Closed dislocation of right ankle 09/10/2016   Successfully reduced in ER 09/10/2016   Diffuse large B-cell lymphoma ([place]) 2015   chemo tx's   GERD (gastroesophageal reflux disease)    Heart disease    Helicobacter pylori infection 06/18/2015   by EGD RX 08/18/1999    History of adenomatous polyp of colon 06/18/2015   HOH (hard of hearing)    Bilateral Hearing  Aids   Malleolar fracture (Right) 09/10/2016    Past Surgical History:  Procedure Laterality Date   ANGIOPLASTY     CATARACT EXTRACTION     COLONOSCOPY  2016   CORONARY ANGIOPLASTY     CYSTOSCOPY WITH STENT PLACEMENT Left 03/17/2019   Procedure: CYSTOSCOPY WITH STENT PLACEMENT;  Surgeon: Festus Aloe, MD;  Location: [facility];  Service: Urology;  Laterality: Left;   EYE SURGERY Bilateral    Cataract Extraction with IOL   GREEN LIGHT LASER TURP (TRANSURETHRAL RESECTION OF PROSTATE N/A 04/16/2015   Procedure: GREEN LIGHT LASER TURP (TRANSURETHRAL RESECTION OF PROSTATE WITH BLADDER BIOPSY;  Surgeon: Collier Flowers, MD;  Location: [facility];  Service: Urology;  Laterality: N/A;   heart stent placement     1998, 2000, 2012   PERIPHERAL VASCULAR THROMBECTOMY Left 03/22/2019   Procedure: PERIPHERAL  VASCULAR THROMBECTOMY and possible IVC filter;  Surgeon: Algernon Huxley, MD;  Location: [facility];  Service: Cardiovascular;  Laterality: Left;   TRANSURETHRAL RESECTION OF PROSTATE N/A 03/02/2017   Procedure: TRANSURETHRAL RESECTION OF THE PROSTATE (TURP);  Surgeon: Hollice Espy, MD;  Location: [facility];  Service: Urology;  Laterality: N/A;    Social History   Socioeconomic History   Marital status: Widowed    Spouse name: Not on file   Number of children: 0   Years of education: Not on file   Highest education level: 12th grade  Occupational History   Occupation: retired  Scientist, 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   Food insecurity    Worry: Never true    Inability: Never true   Transportation needs    Medical: No    Non-medical: No  Tobacco Use   Smoking status: Former Smoker    Packs/day: 1.50    Years: 12.00    Pack years: 18.00    Types: Cigarettes    Quit date: 08/24/1971    Years  since quitting: 47.7   Smokeless tobacco: Never Used  Substance and Sexual Activity   Alcohol use: No    Alcohol/week: 0.0 standard drinks   Drug use: No   Sexual activity: Not on file  Lifestyle   Physical activity    Days per week: 0 days    Minutes per session: 0 min   Stress: Not at all  Relationships   Social connections    Talks on phone: Patient refused    Gets together: Patient refused    Attends religious service: Patient refused    Active member of club or organization: Patient refused    Attends meetings of clubs or organizations: Patient refused    Relationship status: Patient refused   Intimate partner violence    Fear of current or ex partner: Patient refused    Emotionally abused: Patient refused    Physically abused: Patient refused    Forced sexual activity: Patient refused  Other Topics Concern   Not on file  Social History Narrative   Not on file    Family History  Problem Relation Age of Onset   Osteoporosis  Mother    Alzheimer's disease Father    Kidney disease Neg Hx    Prostate cancer Neg Hx    Bladder Cancer Neg Hx    Kidney cancer Neg Hx      Current Outpatient Medications:    apixaban (ELIQUIS) 5 MG TABS tablet, Take 1 tablet (5 mg total) by mouth 2 (two) times daily., Disp: 60 tablet, Rfl: 3   atorvastatin (LIPITOR) 10 MG tablet, Take 1 tablet (10 mg total) by mouth daily., Disp: 30 tablet, Rfl: 11   Cholecalciferol (VITAMIN D3) 1000 units CAPS, Take 1,000 Units by mouth daily., Disp: , Rfl:    dexamethasone (DECADRON) 4 MG tablet, Take 2 tablets (8 mg total) by mouth daily for 5 days., Disp: 10 tablet, Rfl: 0   famotidine (PEPCID) 20 MG tablet, Take 1 tablet (20 mg total) by mouth daily., Disp: 180 tablet, Rfl: 3   fentaNYL (DURAGESIC) 50 MCG/HR, Place 1 patch onto the skin every 3 (three) days., Disp: 5 patch, Rfl: 0   finasteride (PROSCAR) 5 MG tablet, Take 1 tablet (5 mg total) by mouth daily., Disp: 90 tablet, Rfl: 3   hydrochlorothiazide (HYDRODIURIL) 25 MG tablet, Take 0.5 tablets (12.5 mg total) by mouth daily., Disp: , Rfl:    isosorbide dinitrate (ISORDIL) 30 MG tablet, Take 30 mg by mouth daily., Disp: , Rfl:    Magnesium 250 MG TABS, Take 250 mg by mouth daily., Disp: , Rfl:    metoprolol succinate (TOPROL-XL) 25 MG 24 hr tablet, Take 1 tablet (25 mg total) by mouth daily., Disp: 90 tablet, Rfl: 4   mirabegron ER (MYRBETRIQ) 25 MG TB24 tablet, Take 1 tablet (25 mg total) by mouth daily., Disp: 30 tablet, Rfl: 5   nitrofurantoin, macrocrystal-monohydrate, (MACROBID) 100 MG capsule, Take 1 capsule (100 mg total) by mouth 2 (two) times daily., Disp: 20 capsule, Rfl: 0   ondansetron (ZOFRAN) 8 MG tablet, Take 1 tablet (8 mg total) by mouth 2 (two) times daily as needed for nausea or vomiting., Disp: 20 tablet, Rfl: 3   Oxycodone HCl 10 MG TABS, Take by mouth every 4 (four) hours as needed., Disp: , Rfl:    polyethylene glycol powder (CLEARLAX) 17 GM/SCOOP  powder, Take 1 Container by mouth daily., Disp: , Rfl:    prochlorperazine (COMPAZINE) 10 MG tablet, Take 1 tablet (10 mg total) by  mouth every 6 (six) hours as needed for nausea or vomiting., Disp: 30 tablet, Rfl: 3   psyllium (METAMUCIL) 58.6 % packet, Take 1 packet by mouth daily., Disp: , Rfl:  No current facility-administered medications for this visit.   Facility-Administered Medications Ordered in Other Visits:    0.9 %  sodium chloride infusion, , Intravenous, Once, Daishaun Ayre, Vonna Kotyk R, NP   oxyCODONE (Oxy IR/ROXICODONE) immediate release tablet 10 mg, 10 mg, Oral, Once, Zaylei Mullane, Vonna Kotyk R, NP   sodium chloride flush (NS) 0.9 % injection 10 mL, 10 mL, Intravenous, Once, Lan Mcneill, Kirt Boys, NP  Physical exam:  Vitals:   05/07/19 1328  BP: 130/65  Pulse: (!) 102  Resp: 18  Temp: 98.1 F (36.7 C)  TempSrc: Tympanic  Weight: 223 lb (101.2 kg)   Physical Exam Constitutional:      Appearance: Normal appearance.  Cardiovascular:     Rate and Rhythm: Normal rate and regular rhythm.     Pulses: Normal pulses.     Heart sounds: Normal heart sounds.  Pulmonary:     Effort: Pulmonary effort is normal.     Breath sounds: Normal breath sounds.  Abdominal:     General: Bowel sounds are normal.     Palpations: Abdomen is soft.  Skin:    General: Skin is warm and dry.  Neurological:     General: No focal deficit present.     Mental Status: He is alert and oriented to person, place, and time.      CMP Latest Ref Rng & Units 05/07/2019  Glucose 70 - 99 mg/dL 144(H)  BUN 8 - 23 mg/dL 47(H)  Creatinine 0.61 - 1.24 mg/dL 2.04(H)  Sodium 135 - 145 mmol/L 139  Potassium 3.5 - 5.1 mmol/L 3.2(L)  Chloride 98 - 111 mmol/L 102  CO2 22 - 32 mmol/L 24  Calcium 8.9 - 10.3 mg/dL 8.2(L)  Total Protein 6.5 - 8.1 g/dL 7.2  Total Bilirubin 0.3 - 1.2 mg/dL 0.3  Alkaline Phos 38 - 126 U/L 78  AST 15 - 41 U/L 21  ALT 0 - 44 U/L 26   CBC Latest Ref Rng & Units 05/07/2019  WBC 4.0 - 10.5 K/uL  6.3  Hemoglobin 13.0 - 17.0 g/dL 9.0(L)  Hematocrit 39.0 - 52.0 % 27.9(L)  Platelets 150 - 400 K/uL 151    No images are attached to the encounter.  Ct Abdomen Pelvis W Contrast  Result Date: 05/02/2019 CLINICAL DATA:  Abdominal pain. Left pelvic urothelial carcinoma status post concurrent chemotherapy and radiation therapy. Restaging. Additional history of diffuse large B-cell lymphoma diagnosed in 2015 status post chemotherapy. EXAM: CT ABDOMEN AND PELVIS WITH CONTRAST TECHNIQUE: Multidetector CT imaging of the abdomen and pelvis was performed using the standard protocol following bolus administration of intravenous contrast. CONTRAST:  69m OMNIPAQUE IOHEXOL 300 MG/ML  SOLN COMPARISON:  03/01/2019 CT abdomen/pelvis. FINDINGS: Lower chest: No significant pulmonary nodules or acute consolidative airspace disease. Coronary atherosclerosis. Hepatobiliary: Normal liver size. No liver mass. Normal gallbladder with no radiopaque cholelithiasis. No biliary ductal dilatation. Pancreas: Normal, with no mass or duct dilation. Spleen: Normal size. No mass. Adrenals/Urinary Tract: Normal adrenals. Left nephroureteral stent is well positioned with the proximal pigtail portion in the upper left renal collecting system and the distal pigtail portion in posterior bladder lumen. No hydronephrosis. Simple bilateral renal cysts, largest 4.9 cm in the posterior upper right kidney. There is an infiltrative heterogeneously enhancing 5.0 x 4.5 cm upper left pelvic mass encasing the upper left pelvic  ureter and partially encasing the proximal left external iliac vein with associated new left common and external iliac vein stent (series 2/image 67), previously 5.2 x 4.5 cm using similar measurement technique, not substantially changed. This mass is also intimately associated with the superior margin of a left posterior bladder diverticulum. Relatively collapsed bladder. No bladder stones. Stomach/Bowel: Normal non-distended  stomach. Normal caliber small bowel with no small bowel wall thickening. Normal appendix. Oral contrast transits to the colon. Moderate left colonic diverticulosis, with no large bowel wall thickening or acute pericolonic fat stranding. Vascular/Lymphatic: Atherosclerotic abdominal aorta with 3.1 cm infrarenal abdominal aortic aneurysm, stable. Patent portal, splenic, hepatic and renal veins. No pathologically enlarged lymph nodes in the abdomen or pelvis. Reproductive: Top-normal size prostate. Other: No pneumoperitoneum, ascites or focal fluid collection. Musculoskeletal: There is a mildly expansile destructive lytic osseous lesion in the posterior right T12 elements (series 2/image 19), essentially new, measuring approximately 3.7 x 2.2 x 4.0 cm. Moderate thoracolumbar spondylosis. Chronic mild-to-moderate T12 vertebral compression fracture. IMPRESSION: 1. Destructive lytic osseous metastasis in the right T12 posterior elements, essentially new. 2. Locally infiltrative left pelvic tumor encasing the upper left pelvic ureter has not substantially changed in size. 3. Well-positioned left nephroureteral stent with no residual left hydronephrosis. 4. Infrarenal 3.1 cm Abdominal Aortic Aneurysm (ICD10-I71.9). Recommend follow-up aortic ultrasound in 3 years. This recommendation follows ACR consensus guidelines: White Paper of the ACR Incidental Findings Committee II on Vascular Findings. J Am Coll Radiol 2013; 66:599-357. 5.  Aortic Atherosclerosis (ICD10-I70.0). Electronically Signed   By: Ilona Sorrel M.D.   On: 05/02/2019 12:36   Dg Abd 2 Views  Result Date: 05/05/2019 CLINICAL DATA:  Constipated EXAM: ABDOMEN - 2 VIEW COMPARISON:  CT 05/02/2019 FINDINGS: Lung bases are clear. Left ureteral stent remains in place. Nonobstructed bowel gas pattern with large amount of stool throughout the colon. Stool mixed with residual enteral contrast within the colon and rectum. Left iliac stent IMPRESSION: Nonobstructed gas  pattern with large amount of stool throughout the colon. Stool mixed with residual enteral contrast in the distal colon and rectum Electronically Signed   By: Donavan Foil M.D.   On: 05/05/2019 20:49    Assessment and plan- Patient is a 85 y.o. male with high-grade urothelial carcinoma currently on cisplatin and RT who was seen in the ER yesterday due to hematuria and presents today to the clinic due to penile pain.  1.  Urothelial cancer - he is s/p cisplatin and RT.  Found to have new T12 lesion on CT. Patient is pending initiation of RT tomorrow. Followed by Dr. Grayland Ormond and Dr. Baruch Gouty.    2.  Back pain-secondary to T12 met.  Overall, pain is improved on fentanyl, oxycodone (1-2 tablets), acetaminophen and dexamethasone.  However, patient is still having severe back pain and is my hope that pain will further improve with RT. we will continue course of dexamethasone but decrease dose to 4 mg daily x5 days.  Will continue and refill oxycodone 10-68m Q4H PRN for pain (#90). PDMP reviewed.   3.  Constipation -opioid induced.  Continue MiraLAX daily.  Start Senokot 1-2 tabs daily.   4.  Acute on CKD -likely exacerbated by contrasted CT last week.  Serum creatinine appears to have peaked on 9/12 at 2.44 and is down to 2.04 today.  Patient and family report that he is not drinking an adequate amount of fluids.  Will give IV fluids today and follow CMP. Cancel MRI brain.   5.  Hypokalemia - Kdur 3mq x2 days  6.  Urinary burning/frequency/urgency - check UA/culture  7.  Insomnia - start trazodone 25-556mQHS PRN  8.  Social - I called and spoke with his niece. She was updated on findings today and plan. All questions answered. We again discussed hiring caregivers to assist with his care at home.    8. Disposition -RTC 3-4 days  Case and plan discussed with Dr. FiGrayland Ormond  Patient expressed understanding and was in agreement with this plan. He also understands that He can call clinic at any  time with any questions, concerns, or complaints.   Thank you for allowing me to participate in the care of this very pleasant patient.   Time Total: 25 minutes  Visit consisted of counseling and education dealing with the complex and emotionally intense issues of symptom management and palliative care in the setting of serious and potentially life-threatening illness.Greater than 50%  of this time was spent counseling and coordinating care related to the above assessment and plan.  Signed by: JoAltha HarmPhD, NP-C [phone] Cell)

## 2019-05-08 ENCOUNTER — Other Ambulatory Visit: Payer: Self-pay

## 2019-05-08 ENCOUNTER — Other Ambulatory Visit: Payer: Self-pay | Admitting: Hospice and Palliative Medicine

## 2019-05-08 ENCOUNTER — Telehealth: Payer: Self-pay | Admitting: Hospice and Palliative Medicine

## 2019-05-08 ENCOUNTER — Ambulatory Visit
Admission: RE | Admit: 2019-05-08 | Discharge: 2019-05-08 | Disposition: A | Discharge status: Home / Self Care | Payer: PPO | Source: Ambulatory Visit | Attending: Radiation Oncology | Admitting: Radiation Oncology

## 2019-05-08 DIAGNOSIS — Z510 Encounter for antineoplastic radiation therapy: Secondary | ICD-10-CM | POA: Diagnosis not present

## 2019-05-08 DIAGNOSIS — C7951 Secondary malignant neoplasm of bone: Secondary | ICD-10-CM | POA: Diagnosis not present

## 2019-05-08 DIAGNOSIS — C689 Malignant neoplasm of urinary organ, unspecified: Secondary | ICD-10-CM | POA: Diagnosis not present

## 2019-05-08 MED ORDER — NITROFURANTOIN MONOHYD MACRO 100 MG PO CAPS: 100.0000 mg | ORAL_CAPSULE | Freq: Two times a day (BID) | ORAL | 0 refills | Status: AC

## 2019-05-08 NOTE — Telephone Encounter (Signed)
I spoke with patient's niece by phone.  Patient is again constipated and feeling similarly to how he was when he presented to the ER over the weekend.  Niece plans to give him a dose of mag citrate today to see if that helps.  I had previously recommended Senokot but patient has yet to start it.  Again discussed the importance of a daily bowel regimen in the setting of opioids.

## 2019-05-08 NOTE — Progress Notes (Signed)
UA noted. Urine culture pending but will go ahead and restart Macrobid. Patient has allergies listed to many other antibiotic classes. Patient may need chronic suppressive antibiotics. However, it is difficult to differentiate how much of the symptoms are truly from infection vs effects of radiation and cancer. He is also being followed by urology.   I called and spoke with patient's community pharmacist. PA will be required for future oxycodone refills.

## 2019-05-09 ENCOUNTER — Inpatient Hospital Stay: Payer: PPO | Admitting: Hospice and Palliative Medicine

## 2019-05-09 ENCOUNTER — Telehealth: Payer: Self-pay | Admitting: Hospice and Palliative Medicine

## 2019-05-09 ENCOUNTER — Other Ambulatory Visit: Payer: Self-pay | Admitting: *Deleted

## 2019-05-09 ENCOUNTER — Ambulatory Visit
Admission: RE | Admit: 2019-05-09 | Discharge: 2019-05-09 | Disposition: A | Discharge status: Home / Self Care | Payer: PPO | Source: Ambulatory Visit | Attending: Radiation Oncology | Admitting: Radiation Oncology

## 2019-05-09 ENCOUNTER — Other Ambulatory Visit: Payer: Self-pay

## 2019-05-09 ENCOUNTER — Telehealth (INDEPENDENT_AMBULATORY_CARE_PROVIDER_SITE_OTHER): Payer: Self-pay

## 2019-05-09 DIAGNOSIS — Z510 Encounter for antineoplastic radiation therapy: Secondary | ICD-10-CM | POA: Diagnosis not present

## 2019-05-09 DIAGNOSIS — N390 Urinary tract infection, site not specified: Secondary | ICD-10-CM

## 2019-05-09 DIAGNOSIS — Z1624 Resistance to multiple antibiotics: Secondary | ICD-10-CM

## 2019-05-09 LAB — URINE CULTURE: Culture: >=100000 — AB

## 2019-05-09 NOTE — Telephone Encounter (Signed)
Urine culture noted. Patient only with intermediate sensitivity to Macrobid. He has multiple allergies to other antibiotics. Case discussed with Dr. Grayland Ormond. Will refer patient to ID for evaluation and treatment. Will also plan to refer to allergist to explore allergies that might elucidate future treatment options.   Called and updated patient's niece. She says patient is doing better with sleep. Pain is still problematic in the AM but I am hopeful that he will improve with RT. Could otherwise consider increasing LAO.

## 2019-05-09 NOTE — Telephone Encounter (Signed)
PT Bryan Nelson) call to report the patient canceled visit for today due to having a lot of pain from cancer which has spread to the bone.

## 2019-05-10 ENCOUNTER — Inpatient Hospital Stay (HOSPITAL_BASED_OUTPATIENT_CLINIC_OR_DEPARTMENT_OTHER): Payer: PPO | Admitting: Hospice and Palliative Medicine

## 2019-05-10 ENCOUNTER — Ambulatory Visit
Admission: RE | Admit: 2019-05-10 | Discharge: 2019-05-10 | Disposition: A | Discharge status: Home / Self Care | Payer: PPO | Source: Ambulatory Visit | Attending: Radiation Oncology | Admitting: Radiation Oncology

## 2019-05-10 ENCOUNTER — Other Ambulatory Visit: Payer: Self-pay

## 2019-05-10 ENCOUNTER — Inpatient Hospital Stay: Payer: PPO

## 2019-05-10 ENCOUNTER — Encounter: Payer: Self-pay | Admitting: Hospice and Palliative Medicine

## 2019-05-10 VITALS — BP 128/63 | HR 101 | Temp 98.4°F | Resp 20

## 2019-05-10 DIAGNOSIS — G893 Neoplasm related pain (acute) (chronic): Secondary | ICD-10-CM

## 2019-05-10 DIAGNOSIS — Z510 Encounter for antineoplastic radiation therapy: Secondary | ICD-10-CM | POA: Diagnosis not present

## 2019-05-10 DIAGNOSIS — N390 Urinary tract infection, site not specified: Secondary | ICD-10-CM

## 2019-05-10 DIAGNOSIS — C7951 Secondary malignant neoplasm of bone: Secondary | ICD-10-CM

## 2019-05-10 DIAGNOSIS — C689 Malignant neoplasm of urinary organ, unspecified: Secondary | ICD-10-CM | POA: Diagnosis not present

## 2019-05-10 DIAGNOSIS — R339 Retention of urine, unspecified: Secondary | ICD-10-CM

## 2019-05-10 LAB — CBC WITH DIFFERENTIAL/PLATELET
Abs Immature Granulocytes: 0.07 10*3/uL (ref 0.00–0.07)
Basophils Absolute: 0 10*3/uL (ref 0.0–0.1)
Basophils Relative: 0 %
Eosinophils Absolute: 0 10*3/uL (ref 0.0–0.5)
Eosinophils Relative: 0 %
HCT: 27.9 % — ABNORMAL LOW (ref 39.0–52.0)
Hemoglobin: 8.8 g/dL — ABNORMAL LOW (ref 13.0–17.0)
Immature Granulocytes: 1 %
Lymphocytes Relative: 3 %
Lymphs Abs: 0.2 10*3/uL — ABNORMAL LOW (ref 0.7–4.0)
MCH: 28.1 pg (ref 26.0–34.0)
MCHC: 31.5 g/dL (ref 30.0–36.0)
MCV: 89.1 fL (ref 80.0–100.0)
Monocytes Absolute: 0.3 10*3/uL (ref 0.1–1.0)
Monocytes Relative: 5 %
Neutro Abs: 5.9 10*3/uL (ref 1.7–7.7)
Neutrophils Relative %: 91 %
Platelets: 123 10*3/uL — ABNORMAL LOW (ref 150–400)
RBC: 3.13 MIL/uL — ABNORMAL LOW (ref 4.22–5.81)
RDW: 19.2 % — ABNORMAL HIGH (ref 11.5–15.5)
WBC: 6.5 10*3/uL (ref 4.0–10.5)
nRBC: 0 % (ref 0.0–0.2)

## 2019-05-10 LAB — BASIC METABOLIC PANEL
Anion gap: 12 (ref 5–15)
BUN: 52 mg/dL — ABNORMAL HIGH (ref 8–23)
CO2: 21 mmol/L — ABNORMAL LOW (ref 22–32)
Calcium: 7.8 mg/dL — ABNORMAL LOW (ref 8.9–10.3)
Chloride: 105 mmol/L (ref 98–111)
Creatinine, Ser: 2.08 mg/dL — ABNORMAL HIGH (ref 0.61–1.24)
GFR calc Af Amer: 33 mL/min — ABNORMAL LOW (ref >60)
GFR calc non Af Amer: 28 mL/min — ABNORMAL LOW (ref >60)
Glucose, Bld: 149 mg/dL — ABNORMAL HIGH (ref 70–99)
Potassium: 3.9 mmol/L (ref 3.5–5.1)
Sodium: 138 mmol/L (ref 135–145)

## 2019-05-10 MED ORDER — OXYCODONE HCL 5 MG PO TABS
10.0000 mg | ORAL_TABLET | Freq: Once | ORAL | Status: AC
  Administered 2019-05-10: 10 mg via ORAL
  Filled 2019-05-10: qty 2

## 2019-05-10 NOTE — Progress Notes (Signed)
Sympt[facility] [place]  [phone] [phone]  Patient Care Team: [name], MD as PCP - General (Family Medicine) Dingeldein, Remo Lipps, MD as Consulting Physician (Ophthalmology) Hollice Espy, MD as Consulting Physician (Urology) Yolonda Kida, MD as Consulting Physician (Cardiology) Laneta Simmers as Physician Assistant (Urology)   Name of the patient: Bryan Nelson  [id number]  11/29/1931   Date of visit: 05/10/19  Diagnosis-  high-grade urothelial carcinoma   Chief complaint/ Reason for visit- back pain  Heme/Onc history:  Oncology History Overview Note   Diffuse Large B Cell Lymphoma: Patient was initially diagnosed in August 2015, pathology was noted to be anaplastic subtype.  He completed 6 cycles of R-CHOP chemotherapy in January 2016. PET scan January 2017 revealed no evidence of disease.  2.  Urothelial carcinoma: Confirmed by biopsy.  Patient's most recent CT scan on March 01, 2019 reviewed independently and reported as above with progressive left pelvic mass greater than 4 cm.  Patient appears to have no other evidence of disease.  Case discussed with urology.  Given  the fact that patient is symptomatic with pain and has localized disease, he will benefit from concurrent chemotherapy using weekly cisplatin 40 mg/m along with daily XRT.  Delay cycle 1 of cisplatin today secondary to elevated creatinine.  Continue daily XRT.  Return to clinic in 1 week for further evaluation and reconsideration of cycle 2.     DLBCL (diffuse large B cell lymphoma) ([place])  04/10/2014 Initial Diagnosis   DLBCL (diffuse large B cell lymphoma) ([facility])     Interval history-patient returns to the clinic today for follow-up of neoplasm related pain.  Patient continues to have severe and persistent back pain related to his T12 lesion.  He is currently receiving RT to the lesion.  Patient is taking oxycodone several times a  day at home and says that is helping to control his pain at home.  He has had intermittent constipation but denies any other issues with the medications.  Patient reports that he feels he is drinking adequate fluid and oral intake is reportedly good.  Denies fever chills.  Does continue to have urinary frequency.  Denies any neurologic complaints. Denies recent fevers or illnesses. Denies any easy bleeding or bruising. Reports good appetite and denies weight loss. Denies chest pain. Denies any nausea, vomiting, constipation, or diarrhea. Patient offers no further specific complaints today.  ECOG FS:2 - Symptomatic, <50% confined to bed  Review of systems- Review of Systems  Constitutional: Negative for chills, fever, malaise/fatigue and weight loss.  Respiratory: Negative for cough.   Genitourinary: Positive for dysuria, frequency and urgency. Negative for flank pain and hematuria.  Musculoskeletal: Positive for back pain. Negative for falls.  Neurological: Negative for dizziness and weakness.     Current treatment-cisplatin and RT  Allergies  Allergen Reactions   Augmentin [Amoxicillin-Pot Clavulanate]     Tongue swelling 2 days after taking   Ciprofloxacin Diarrhea   Lisinopril Other (See Comments)    Unknown    Penicillins Swelling    unknwon reaction.  tolerates amoxicillin Has patient had a PCN reaction causing immediate rash, facial/tongue/throat swelling, SOB or lightheadedness with hypotension: No Has patient had a PCN reaction causing severe rash involving mucus membranes or skin necrosis: No Has patient had a PCN reaction that required hospitalization: No Has patient had a PCN reaction occurring within the last 10 years: Yes If all of the above answers are "NO", then may  proceed with Cephalosporin use.    Bactrim [Sulfamethoxazole-Trimethoprim] Rash   Sulfa Antibiotics Itching and Rash    Past Medical History:  Diagnosis Date   Arteriosclerosis of coronary  artery 08/26/2011   Overview:      a.  1999 PCI of the mid LAD with stent.      b.  2002 Cath [place]: EF 56%. RCA-distal 25/25%. Left main-50% ostial.  Left circumflex-25% OM2.  LAD-25% proximal.  75% mid.  25/25% distal. 75% D1.      c.  07/2011 PCI of RCA with DES.[place]    Bleeding gastrointestinal 08/26/2011   Overview:      a.  Chronic abdominal pain, present improving.      b.  Duodenitis and gastritis by EGD in 2000.      c.  Pylori in 2000, treated.      d.  Gastroesophageal reflux disease.      e.  Diverticular disease.     BP (high blood pressure) 08/26/2011   Closed dislocation of right ankle 09/10/2016   Successfully reduced in ER 09/10/2016   Diffuse large B-cell lymphoma ([place]) 2015   chemo tx's   GERD (gastroesophageal reflux disease)    Heart disease    Helicobacter pylori infection 06/18/2015   by EGD RX 08/18/1999    History of adenomatous polyp of colon 06/18/2015   HOH (hard of hearing)    Bilateral Hearing  Aids   Malleolar fracture (Right) 09/10/2016    Past Surgical History:  Procedure Laterality Date   ANGIOPLASTY     CATARACT EXTRACTION     COLONOSCOPY  2016   CORONARY ANGIOPLASTY     CYSTOSCOPY WITH STENT PLACEMENT Left 03/17/2019   Procedure: CYSTOSCOPY WITH STENT PLACEMENT;  Surgeon: Festus Aloe, MD;  Location: [facility];  Service: Urology;  Laterality: Left;   EYE SURGERY Bilateral    Cataract Extraction with IOL   GREEN LIGHT LASER TURP (TRANSURETHRAL RESECTION OF PROSTATE N/A 04/16/2015   Procedure: GREEN LIGHT LASER TURP (TRANSURETHRAL RESECTION OF PROSTATE WITH BLADDER BIOPSY;  Surgeon: Collier Flowers, MD;  Location: [facility];  Service: Urology;  Laterality: N/A;   heart stent placement     1998, 2000, 2012   PERIPHERAL VASCULAR THROMBECTOMY Left 03/22/2019   Procedure: PERIPHERAL VASCULAR THROMBECTOMY and possible IVC filter;  Surgeon: Algernon Huxley, MD;  Location: [place] [facility];  Service: Cardiovascular;  Laterality: Left;    TRANSURETHRAL RESECTION OF PROSTATE N/A 03/02/2017   Procedure: TRANSURETHRAL RESECTION OF THE PROSTATE (TURP);  Surgeon: Hollice Espy, MD;  Location: [facility];  Service: Urology;  Laterality: N/A;    Social History   Socioeconomic History   Marital status: Widowed    Spouse name: Not on file   Number of children: 0   Years of education: Not on file   Highest education level: 12th grade  Occupational History   Occupation: retired  Scientist, 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   Food insecurity    Worry: Never true    Inability: Never true   Transportation needs    Medical: No    Non-medical: No  Tobacco Use   Smoking status: Former Smoker    Packs/day: 1.50    Years: 12.00    Pack years: 18.00    Types: Cigarettes    Quit date: 08/24/1971    Years since quitting: 47.7   Smokeless tobacco: Never Used  Substance and Sexual Activity   Alcohol use: No  Alcohol/week: 0.0 standard drinks   Drug use: No   Sexual activity: Not on file  Lifestyle   Physical activity    Days per week: 0 days    Minutes per session: 0 min   Stress: Not at all  Relationships   Social connections    Talks on phone: Patient refused    Gets together: Patient refused    Attends religious service: Patient refused    Active member of club or organization: Patient refused    Attends meetings of clubs or organizations: Patient refused    Relationship status: Patient refused   Intimate partner violence    Fear of current or ex partner: Patient refused    Emotionally abused: Patient refused    Physically abused: Patient refused    Forced sexual activity: Patient refused  Other Topics Concern   Not on file  Social History Narrative   Not on file    Family History  Problem Relation Age of Onset   Osteoporosis Mother    Alzheimer's disease Father    Kidney disease Neg Hx    Prostate cancer Neg Hx    Bladder Cancer Neg Hx    Kidney cancer Neg Hx       Current Outpatient Medications:    apixaban (ELIQUIS) 5 MG TABS tablet, Take 1 tablet (5 mg total) by mouth 2 (two) times daily., Disp: 60 tablet, Rfl: 3   atorvastatin (LIPITOR) 10 MG tablet, Take 1 tablet (10 mg total) by mouth daily., Disp: 30 tablet, Rfl: 11   Cholecalciferol (VITAMIN D3) 1000 units CAPS, Take 1,000 Units by mouth daily., Disp: , Rfl:    dexamethasone (DECADRON) 4 MG tablet, Take 1 tablet (4 mg total) by mouth daily for 5 days., Disp: 5 tablet, Rfl: 0   famotidine (PEPCID) 20 MG tablet, Take 1 tablet (20 mg total) by mouth daily., Disp: 180 tablet, Rfl: 3   fentaNYL (DURAGESIC) 50 MCG/HR, Place 1 patch onto the skin every 3 (three) days., Disp: 5 patch, Rfl: 0   finasteride (PROSCAR) 5 MG tablet, Take 1 tablet (5 mg total) by mouth daily., Disp: 90 tablet, Rfl: 3   hydrochlorothiazide (HYDRODIURIL) 25 MG tablet, Take 0.5 tablets (12.5 mg total) by mouth daily., Disp: , Rfl:    isosorbide dinitrate (ISORDIL) 30 MG tablet, Take 30 mg by mouth daily., Disp: , Rfl:    Magnesium 250 MG TABS, Take 250 mg by mouth daily., Disp: , Rfl:    metoprolol succinate (TOPROL-XL) 25 MG 24 hr tablet, Take 1 tablet (25 mg total) by mouth daily., Disp: 90 tablet, Rfl: 4   mirabegron ER (MYRBETRIQ) 25 MG TB24 tablet, Take 1 tablet (25 mg total) by mouth daily., Disp: 30 tablet, Rfl: 5   nitrofurantoin, macrocrystal-monohydrate, (MACROBID) 100 MG capsule, Take 1 capsule (100 mg total) by mouth 2 (two) times daily., Disp: 20 capsule, Rfl: 0   ondansetron (ZOFRAN) 8 MG tablet, Take 1 tablet (8 mg total) by mouth 2 (two) times daily as needed for nausea or vomiting., Disp: 20 tablet, Rfl: 3   Oxycodone HCl 10 MG TABS, Take 1-2 tablets (10-20 mg total) by mouth every 4 (four) hours as needed., Disp: 90 tablet, Rfl: 0   polyethylene glycol powder (CLEARLAX) 17 GM/SCOOP powder, Take 1 Container by mouth daily., Disp: , Rfl:    potassium chloride SA (K-DUR) 20 MEQ tablet, Take 1  tablet (20 mEq total) by mouth daily., Disp: 2 tablet, Rfl: 0   prochlorperazine (COMPAZINE) 10 MG tablet,  Take 1 tablet (10 mg total) by mouth every 6 (six) hours as needed for nausea or vomiting., Disp: 30 tablet, Rfl: 3   psyllium (METAMUCIL) 58.6 % packet, Take 1 packet by mouth daily., Disp: , Rfl:    senna (SENOKOT) 8.6 MG TABS tablet, Take 1 tablet (8.6 mg total) by mouth daily as needed for mild constipation., Disp: 120 tablet, Rfl: 0   traZODone (DESYREL) 50 MG tablet, Take 0.5-1 tablets (25-50 mg total) by mouth at bedtime., Disp: 30 tablet, Rfl: 0 No current facility-administered medications for this visit.   Facility-Administered Medications Ordered in Other Visits:    sodium chloride flush (NS) 0.9 % injection 10 mL, 10 mL, Intravenous, Once, Yarelli Decelles, Kirt Boys, NP  Physical exam:  Vitals:   05/10/19 1349  BP: 128/63  Pulse: (!) 101  Resp: 20  Temp: 98.4 F (36.9 C)  TempSrc: Tympanic   Physical Exam Constitutional:      Appearance: Normal appearance.  Eyes:     Comments: Right pupil dilated  Cardiovascular:     Rate and Rhythm: Normal rate and regular rhythm.     Pulses: Normal pulses.     Heart sounds: Normal heart sounds.  Pulmonary:     Effort: Pulmonary effort is normal.     Breath sounds: Normal breath sounds.  Abdominal:     General: Bowel sounds are normal.     Palpations: Abdomen is soft.  Skin:    General: Skin is warm and dry.  Neurological:     General: No focal deficit present.     Mental Status: He is alert and oriented to person, place, and time.      CMP Latest Ref Rng & Units 05/10/2019  Glucose 70 - 99 mg/dL 149(H)  BUN 8 - 23 mg/dL 52(H)  Creatinine 0.61 - 1.24 mg/dL 2.08(H)  Sodium 135 - 145 mmol/L 138  Potassium 3.5 - 5.1 mmol/L 3.9  Chloride 98 - 111 mmol/L 105  CO2 22 - 32 mmol/L 21(L)  Calcium 8.9 - 10.3 mg/dL 7.8(L)  Total Protein 6.5 - 8.1 g/dL -  Total Bilirubin 0.3 - 1.2 mg/dL -  Alkaline Phos 38 - 126 U/L -  AST 15 -  41 U/L -  ALT 0 - 44 U/L -   CBC Latest Ref Rng & Units 05/10/2019  WBC 4.0 - 10.5 K/uL 6.5  Hemoglobin 13.0 - 17.0 g/dL 8.8(L)  Hematocrit 39.0 - 52.0 % 27.9(L)  Platelets 150 - 400 K/uL 123(L)    No images are attached to the encounter.  Ct Abdomen Pelvis W Contrast  Result Date: 05/02/2019 CLINICAL DATA:  Abdominal pain. Left pelvic urothelial carcinoma status post concurrent chemotherapy and radiation therapy. Restaging. Additional history of diffuse large B-cell lymphoma diagnosed in 2015 status post chemotherapy. EXAM: CT ABDOMEN AND PELVIS WITH CONTRAST TECHNIQUE: Multidetector CT imaging of the abdomen and pelvis was performed using the standard protocol following bolus administration of intravenous contrast. CONTRAST:  22m OMNIPAQUE IOHEXOL 300 MG/ML  SOLN COMPARISON:  03/01/2019 CT abdomen/pelvis. FINDINGS: Lower chest: No significant pulmonary nodules or acute consolidative airspace disease. Coronary atherosclerosis. Hepatobiliary: Normal liver size. No liver mass. Normal gallbladder with no radiopaque cholelithiasis. No biliary ductal dilatation. Pancreas: Normal, with no mass or duct dilation. Spleen: Normal size. No mass. Adrenals/Urinary Tract: Normal adrenals. Left nephroureteral stent is well positioned with the proximal pigtail portion in the upper left renal collecting system and the distal pigtail portion in posterior bladder lumen. No hydronephrosis. Simple bilateral renal cysts, largest  4.9 cm in the posterior upper right kidney. There is an infiltrative heterogeneously enhancing 5.0 x 4.5 cm upper left pelvic mass encasing the upper left pelvic ureter and partially encasing the proximal left external iliac vein with associated new left common and external iliac vein stent (series 2/image 67), previously 5.2 x 4.5 cm using similar measurement technique, not substantially changed. This mass is also intimately associated with the superior margin of a left posterior bladder  diverticulum. Relatively collapsed bladder. No bladder stones. Stomach/Bowel: Normal non-distended stomach. Normal caliber small bowel with no small bowel wall thickening. Normal appendix. Oral contrast transits to the colon. Moderate left colonic diverticulosis, with no large bowel wall thickening or acute pericolonic fat stranding. Vascular/Lymphatic: Atherosclerotic abdominal aorta with 3.1 cm infrarenal abdominal aortic aneurysm, stable. Patent portal, splenic, hepatic and renal veins. No pathologically enlarged lymph nodes in the abdomen or pelvis. Reproductive: Top-normal size prostate. Other: No pneumoperitoneum, ascites or focal fluid collection. Musculoskeletal: There is a mildly expansile destructive lytic osseous lesion in the posterior right T12 elements (series 2/image 19), essentially new, measuring approximately 3.7 x 2.2 x 4.0 cm. Moderate thoracolumbar spondylosis. Chronic mild-to-moderate T12 vertebral compression fracture. IMPRESSION: 1. Destructive lytic osseous metastasis in the right T12 posterior elements, essentially new. 2. Locally infiltrative left pelvic tumor encasing the upper left pelvic ureter has not substantially changed in size. 3. Well-positioned left nephroureteral stent with no residual left hydronephrosis. 4. Infrarenal 3.1 cm Abdominal Aortic Aneurysm (ICD10-I71.9). Recommend follow-up aortic ultrasound in 3 years. This recommendation follows ACR consensus guidelines: White Paper of the ACR Incidental Findings Committee II on Vascular Findings. J Am Coll Radiol 2013; 14:431-540. 5.  Aortic Atherosclerosis (ICD10-I70.0). Electronically Signed   By: Ilona Sorrel M.D.   On: 05/02/2019 12:36   Dg Abd 2 Views  Result Date: 05/05/2019 CLINICAL DATA:  Constipated EXAM: ABDOMEN - 2 VIEW COMPARISON:  CT 05/02/2019 FINDINGS: Lung bases are clear. Left ureteral stent remains in place. Nonobstructed bowel gas pattern with large amount of stool throughout the colon. Stool mixed with  residual enteral contrast within the colon and rectum. Left iliac stent IMPRESSION: Nonobstructed gas pattern with large amount of stool throughout the colon. Stool mixed with residual enteral contrast in the distal colon and rectum Electronically Signed   By: Donavan Foil M.D.   On: 05/05/2019 20:49    Assessment and plan- Patient is a 85 y.o. male with high-grade urothelial carcinoma currently on cisplatin and RT who was seen in the ER yesterday due to hematuria and presents today to the clinic due to penile pain.  1.  Urothelial cancer - he is s/p cisplatin and RT.  Found to have new T12 lesion on CT. Patient is currently receiving radiation. Followed by Dr. Grayland Ormond and Dr. Baruch Gouty.    2.  Back pain-secondary to T12 met.  Patient continues to have persistent pain but feels that it is improved some on current regimen of fentanyl, oxycodone (1-2 tablets), acetaminophen and dexamethasone.  It is my hope that RT will stabilize pain and allow [place] to wean opioids.  We will give him an extra dose of oxycodone today in the clinic.  3.  Constipation -opioid induced.  Continue MiraLAX daily.  Increase Senokot to 2 tablets twice daily.  4.  CKD 3-4  -Creatinine 1.5-2.08 at baseline. Discussed with nephew. Current GFR may reflect new baseline. Consider referral to nephrology to follow. Don't think that patient or family would be keen on the idea of HD.   5.  UTI - E coli on culture.  Only intermediate sensitivity to Macrobid.  Not sure if Macrobid will concentrate enough in the urine to be effective in the setting of CKD 3-4.  Given resistance on culture and allergies to other antibiotic classes, a referral was made to ID.  7.  Insomnia -improved on trazodone  8.  Social - I called and updated patient's nephew.  8. Disposition -RTC next week to see [facility]  Case and plan discussed with Dr. Grayland Ormond.   Patient expressed understanding and was in agreement with this plan. He also understands that He can  call clinic at any time with any questions, concerns, or complaints.   Thank you for allowing me to participate in the care of this very pleasant patient.   Time Total: 15 minutes  Visit consisted of counseling and education dealing with the complex and emotionally intense issues of symptom management and palliative care in the setting of serious and potentially life-threatening illness.Greater than 50%  of this time was spent counseling and coordinating care related to the above assessment and plan.  Signed by: Altha Harm, PhD, NP-C [phone] (Work Cell)

## 2019-05-10 NOTE — Progress Notes (Signed)
[place]  [phone]  [phone]     Bryan Nelson DOB: 1932-06-20  MR#: [id number]  AYT#:[id number]  Patient Care Team: [name], MD as PCP - General (Family Medicine) Dingeldein, Remo Lipps, MD as Consulting Physician (Ophthalmology) Hollice Espy, MD as Consulting Physician (Urology) Yolonda Kida, MD as Consulting Physician (Cardiology) Laneta Simmers as Physician Assistant (Urology)   CHIEF COMPLAINT: History of diffuse large B-cell lymphoma, now with urothelial carcinoma.  INTERVAL HISTORY: Patient returns to clinic today for further evaluation.  He has multiple complaints including continued back pain as well as urinary frequency.  He has persistent weakness and fatigue.  He has no neurologic complaints.  He denies any recent fevers or illnesses.  He has a fair appetite and denies weight loss.  He denies any chest pain, shortness of breath, cough, or hemoptysis.  He has no nausea, vomiting, or diarrhea.  He continues to have constipation.  He denies any melena or hematochezia.  Patient states he feels generally terrible, but offers no further specific complaints.  REVIEW OF SYSTEMS:   Review of Systems  Constitutional: Positive for malaise/fatigue. Negative for diaphoresis, fever and weight loss.  Respiratory: Negative.  Negative for cough and shortness of breath.   Cardiovascular: Negative.  Negative for chest pain and leg swelling.  Gastrointestinal: Positive for constipation. Negative for abdominal pain, blood in stool and melena.  Genitourinary: Positive for flank pain and frequency. Negative for dysuria, hematuria and urgency.  Musculoskeletal: Positive for back pain. Negative for joint pain.  Skin: Negative.  Negative for rash.  Neurological: Positive for tremors and weakness. Negative for dizziness, focal weakness and headaches.  Psychiatric/Behavioral: The patient is not nervous/anxious and does not have  insomnia.     As per HPI. Otherwise, a complete review of systems is negative.  ONCOLOGY HISTORY: Oncology History Overview Note   Diffuse Large B Cell Lymphoma: Patient was initially diagnosed in August 2015, pathology was noted to be anaplastic subtype.  He completed 6 cycles of R-CHOP chemotherapy in January 2016. PET scan January 2017 revealed no evidence of disease.  2.  Urothelial carcinoma: Confirmed by biopsy.  Patient's most recent CT scan on March 01, 2019 reviewed independently and reported as above with progressive left pelvic mass greater than 4 cm.  Patient appears to have no other evidence of disease.  Case discussed with urology.  Given  the fact that patient is symptomatic with pain and has localized disease, he will benefit from concurrent chemotherapy using weekly cisplatin 40 mg/m along with daily XRT.  Delay cycle 1 of cisplatin today secondary to elevated creatinine.  Continue daily XRT.  Return to clinic in 1 week for further evaluation and reconsideration of cycle 2.     DLBCL (diffuse large B cell lymphoma) ([place])  04/10/2014 Initial Diagnosis   DLBCL (diffuse large B cell lymphoma) ([place])     PAST MEDICAL HISTORY: Past Medical History:  Diagnosis Date  . Arteriosclerosis of coronary artery 08/26/2011   Overview:      a.  1999 PCI of the mid LAD with stent.      b.  2002 Cath [facility]: EF 56%. RCA-distal 25/25%. Left main-50% ostial.  Left circumflex-25% OM2.  LAD-25% proximal.  75% mid.  25/25% distal. 75% D1.      c.  07/2011 PCI of RCA with DES.[place]   . Bleeding gastrointestinal 08/26/2011   Overview:      a.  Chronic abdominal pain, present improving.  b.  Duodenitis and gastritis by EGD in 2000.      c.  Pylori in 2000, treated.      d.  Gastroesophageal reflux disease.      e.  Diverticular disease.    . BP (high blood pressure) 08/26/2011  . Closed dislocation of right ankle 09/10/2016   Successfully reduced in ER 09/10/2016  . Diffuse large B-cell lymphoma ([place])  2015   chemo tx's  . GERD (gastroesophageal reflux disease)   . Heart disease   . Helicobacter pylori infection 06/18/2015   by EGD RX 08/18/1999   . History of adenomatous polyp of colon 06/18/2015  . HOH (hard of hearing)    Bilateral Hearing  Aids  . Malleolar fracture (Right) 09/10/2016    PAST SURGICAL HISTORY: Past Surgical History:  Procedure Laterality Date  . ANGIOPLASTY    . CATARACT EXTRACTION    . COLONOSCOPY  2016  . CORONARY ANGIOPLASTY    . CYSTOSCOPY WITH STENT PLACEMENT Left 03/17/2019   Procedure: CYSTOSCOPY WITH STENT PLACEMENT;  Surgeon: Festus Aloe, MD;  Location: [facility] ORS;  Service: Urology;  Laterality: Left;  . EYE SURGERY Bilateral    Cataract Extraction with IOL  . GREEN LIGHT LASER TURP (TRANSURETHRAL RESECTION OF PROSTATE N/A 04/16/2015   Procedure: GREEN LIGHT LASER TURP (TRANSURETHRAL RESECTION OF PROSTATE WITH BLADDER BIOPSY;  Surgeon: Collier Flowers, MD;  Location: [facility] ORS;  Service: Urology;  Laterality: N/A;  . heart stent placement     1998, 2000, 2012  . PERIPHERAL VASCULAR THROMBECTOMY Left 03/22/2019   Procedure: PERIPHERAL VASCULAR THROMBECTOMY and possible IVC filter;  Surgeon: Algernon Huxley, MD;  Location: [facility];  Service: Cardiovascular;  Laterality: Left;  . TRANSURETHRAL RESECTION OF PROSTATE N/A 03/02/2017   Procedure: TRANSURETHRAL RESECTION OF THE PROSTATE (TURP);  Surgeon: Hollice Espy, MD;  Location: [facility] OR[facility];  Service: Urology;  Laterality: N/A;    FAMILY HISTORY Family History  Problem Relation Age of Onset  . Osteoporosis Mother   . Alzheimer's disease Father   . Kidney disease Neg Hx   . Prostate cancer Neg Hx   . Bladder Cancer Neg Hx   . Kidney cancer Neg Hx    AVANCED DIRECTIVES:    HEALTH MAINTENANCE: Social History   Tobacco Use  . Smoking status: Former Smoker    Packs/day: 1.50    Years: 12.00    Pack years: 18.00    Types: Cigarettes    Quit date: 08/24/1971    Years since quitting:  47.7  . Smokeless tobacco: Never Used  Substance Use Topics  . Alcohol use: No    Alcohol/week: 0.0 standard drinks  . Drug use: No    Allergies  Allergen Reactions  . Augmentin [Amoxicillin-Pot Clavulanate]     Tongue swelling 2 days after taking  . Ciprofloxacin Diarrhea  . Lisinopril Other (See Comments)    Unknown   . Penicillins Swelling    unknwon reaction.  tolerates amoxicillin Has patient had a PCN reaction causing immediate rash, facial/tongue/throat swelling, SOB or lightheadedness with hypotension: No Has patient had a PCN reaction causing severe rash involving mucus membranes or skin necrosis: No Has patient had a PCN reaction that required hospitalization: No Has patient had a PCN reaction occurring within the last 10 years: Yes If all of the above answers are "NO", then may proceed with Cephalosporin use.   . Bactrim [Sulfamethoxazole-Trimethoprim] Rash  . Sulfa Antibiotics Itching and Rash    Current Outpatient Medications  Medication Sig Dispense Refill  . apixaban (ELIQUIS) 5 MG TABS tablet Take 1 tablet (5 mg total) by mouth 2 (two) times daily. 60 tablet 3  . atorvastatin (LIPITOR) 10 MG tablet Take 1 tablet (10 mg total) by mouth daily. 30 tablet 11  . Cholecalciferol (VITAMIN D3) 1000 units CAPS Take 1,000 Units by mouth daily.    . famotidine (PEPCID) 20 MG tablet Take 1 tablet (20 mg total) by mouth daily. 180 tablet 3  . fentaNYL (DURAGESIC) 50 MCG/HR Place 1 patch onto the skin every 3 (three) days. 5 patch 0  . finasteride (PROSCAR) 5 MG tablet Take 1 tablet (5 mg total) by mouth daily. 90 tablet 3  . hydrochlorothiazide (HYDRODIURIL) 25 MG tablet Take 0.5 tablets (12.5 mg total) by mouth daily.    . isosorbide dinitrate (ISORDIL) 30 MG tablet Take 30 mg by mouth daily.    . Magnesium 250 MG TABS Take 250 mg by mouth daily.    . metoprolol succinate (TOPROL-XL) 25 MG 24 hr tablet Take 1 tablet (25 mg total) by mouth daily. 90 tablet 4  . mirabegron ER  (MYRBETRIQ) 25 MG TB24 tablet Take 1 tablet (25 mg total) by mouth daily. 30 tablet 5  . nitrofurantoin, macrocrystal-monohydrate, (MACROBID) 100 MG capsule Take 1 capsule (100 mg total) by mouth 2 (two) times daily. 20 capsule 0  . ondansetron (ZOFRAN) 8 MG tablet Take 1 tablet (8 mg total) by mouth 2 (two) times daily as needed for nausea or vomiting. 20 tablet 3  . Oxycodone HCl 10 MG TABS Take 1-2 tablets (10-20 mg total) by mouth every 4 (four) hours as needed. 90 tablet 0  . polyethylene glycol powder (CLEARLAX) 17 GM/SCOOP powder Take 1 Container by mouth daily.    . potassium chloride SA (K-DUR) 20 MEQ tablet Take 1 tablet (20 mEq total) by mouth daily. 2 tablet 0  . prochlorperazine (COMPAZINE) 10 MG tablet Take 1 tablet (10 mg total) by mouth every 6 (six) hours as needed for nausea or vomiting. 30 tablet 3  . psyllium (METAMUCIL) 58.6 % packet Take 1 packet by mouth daily.    Marland Kitchen senna (SENOKOT) 8.6 MG TABS tablet Take 1 tablet (8.6 mg total) by mouth daily as needed for mild constipation. 120 tablet 0  . traZODone (DESYREL) 50 MG tablet Take 0.5-1 tablets (25-50 mg total) by mouth at bedtime. 30 tablet 0  . naloxegol oxalate (MOVANTIK) 12.5 MG TABS tablet Take 1 tablet (12.5 mg total) by mouth daily. 30 tablet 2   No current facility-administered medications for this visit.    Facility-Administered Medications Ordered in Other Visits  Medication Dose Route Frequency Provider Last Rate Last Dose  . sodium chloride flush (NS) 0.9 % injection 10 mL  10 mL Intravenous Once Borders, Kirt Boys, NP        OBJECTIVE: BP (!) 95/53 (BP Location: Right Arm, Patient Position: Sitting)   Pulse 100   Temp (!) 97.4 F (36.3 C) (Tympanic)    There is no height or weight on file to calculate BMI.    ECOG FS:1 - Symptomatic but completely ambulatory  General: Well-developed, well-nourished, no acute distress.  Appears uncomfortable. Eyes: Pink conjunctiva, anicteric sclera. HEENT: Normocephalic,  moist mucous membranes. Lungs: Clear to auscultation bilaterally. Heart: Regular rate and rhythm. No rubs, murmurs, or gallops. Abdomen: Soft, nontender, nondistended. No organomegaly noted, normoactive bowel sounds. Musculoskeletal: No edema, cyanosis, or clubbing. Neuro: Alert, answering all questions appropriately. Cranial nerves grossly intact. Skin: No  rashes or petechiae noted. Psych: Normal affect.  LAB RESULTS:  Appointment on 05/15/2019  Component Date Value Ref Range Status  . Sodium 05/15/2019 132* 135 - 145 mmol/L Final  . Potassium 05/15/2019 3.9  3.5 - 5.1 mmol/L Final  . Chloride 05/15/2019 93* 98 - 111 mmol/L Final  . CO2 05/15/2019 25  22 - 32 mmol/L Final  . Glucose, Bld 05/15/2019 134* 70 - 99 mg/dL Final  . BUN 05/15/2019 37* 8 - 23 mg/dL Final  . Creatinine, Ser 05/15/2019 1.88* 0.61 - 1.24 mg/dL Final  . Calcium 05/15/2019 8.7* 8.9 - 10.3 mg/dL Final  . GFR calc non Af Amer 05/15/2019 32* >60 mL/min Final  . GFR calc Af Amer 05/15/2019 37* >60 mL/min Final  . Anion gap 05/15/2019 14  5 - 15 Final   Performed at [facility], [address] [place], [place] [address]  . WBC 05/15/2019 6.2  4.0 - 10.5 K/uL Final  . RBC 05/15/2019 3.22* 4.22 - 5.81 MIL/uL Final  . Hemoglobin 05/15/2019 9.1* 13.0 - 17.0 g/dL Final  . HCT 05/15/2019 28.3* 39.0 - 52.0 % Final  . MCV 05/15/2019 87.9  80.0 - 100.0 fL Final  . MCH 05/15/2019 28.3  26.0 - 34.0 pg Final  . MCHC 05/15/2019 32.2  30.0 - 36.0 g/dL Final  . RDW 05/15/2019 18.5* 11.5 - 15.5 % Final  . Platelets 05/15/2019 139* 150 - 400 K/uL Final  . nRBC 05/15/2019 0.0  0.0 - 0.2 % Final  . Neutrophils Relative % 05/15/2019 80  % Final  . Neutro Abs 05/15/2019 5.0  1.7 - 7.7 K/uL Final  . Lymphocytes Relative 05/15/2019 6  % Final  . Lymphs Abs 05/15/2019 0.4* 0.7 - 4.0 K/uL Final  . Monocytes Relative 05/15/2019 11  % Final  . Monocytes Absolute 05/15/2019 0.7  0.1 - 1.0 K/uL Final  . Eosinophils Relative  05/15/2019 1  % Final  . Eosinophils Absolute 05/15/2019 0.0  0.0 - 0.5 K/uL Final  . Basophils Relative 05/15/2019 0  % Final  . Basophils Absolute 05/15/2019 0.0  0.0 - 0.1 K/uL Final  . Immature Granulocytes 05/15/2019 2  % Final  . Abs Immature Granulocytes 05/15/2019 0.09* 0.00 - 0.07 K/uL Final   Performed at [facility], [address] [place], [place] [address]    STUDIES: No results found.   ASSESSMENT: Stage IV urothelial carcinoma.  PLAN:    1. Diffuse Large B Cell Lymphoma: Patient was initially diagnosed in August 2015, pathology was noted to be anaplastic subtype.  He completed 6 cycles of R-CHOP chemotherapy in January 2016. PET scan January 2017 revealed no evidence of disease.  2.  Urothelial carcinoma: Confirmed by biopsy.  Patient last received chemotherapy with weekly cisplatin on April 24, 2019.  He has now completed XRT to his pelvic mass, but continues XRT to the metastatic lesion noted in his thoracic spine.  CT scan results from May 02, 2019 reviewed independently with essentially unchanged left pelvic mass encasing pelvic ureter.  He was also noted to have a new metastatic lesion in his T12 vertebrae and is now undergoing XRT for this.  No further chemotherapy is planned at this time.  Will repeat PET scan first week of November for further evaluation.  Patient will follow-up 1 to 2 days later.   3.  Hydronephrosis: Patient has ureteral stent in place. 4.  Renal insufficiency: Creatinine is 1.88 today.  Proceed with 1 L of IV fluids today. 5.  Pain: Continue with current narcotic regimen.  Proceed with XRT as above.  Appreciate palliative care input.   6.  Anemia: Hemoglobin decreased, but stable at 9.1. 7.  DVT: Appreciate vascular surgery input.  Patient will require Eliquis for minimum of 6 months until at least February 2021. 8.  UTI: Case discussed with infectious disease as well as urology and it was determined that patient may just be  chronically colonized and not have an active infection.  Patient was evaluated by infectious disease today.  Proceed with self-catheterization twice per day.   9.  Tremors: Patient does not complain of this today.  Possibly medication induced, monitor. 10.  Constipation: Continue OTC MiraLAX and magnesium citrate. 11.  Insomnia: Multifactorial.  Continue Xanax as needed.  Patient expressed understanding and was in agreement with this plan. He also understands that He can call clinic at any time with any questions, concerns, or complaints.    Lloyd Huger, MD   05/15/2019 4:44 PM

## 2019-05-11 ENCOUNTER — Other Ambulatory Visit: Payer: Self-pay

## 2019-05-11 ENCOUNTER — Ambulatory Visit
Admission: RE | Admit: 2019-05-11 | Discharge: 2019-05-11 | Disposition: A | Discharge status: Home / Self Care | Payer: PPO | Source: Ambulatory Visit | Attending: Radiation Oncology | Admitting: Radiation Oncology

## 2019-05-11 DIAGNOSIS — Z510 Encounter for antineoplastic radiation therapy: Secondary | ICD-10-CM | POA: Diagnosis not present

## 2019-05-12 ENCOUNTER — Ambulatory Visit: Payer: PPO

## 2019-05-14 ENCOUNTER — Encounter: Payer: Self-pay | Admitting: Oncology

## 2019-05-14 ENCOUNTER — Ambulatory Visit
Admission: RE | Admit: 2019-05-14 | Discharge: 2019-05-14 | Disposition: A | Discharge status: Home / Self Care | Payer: PPO | Source: Ambulatory Visit | Attending: Radiation Oncology | Admitting: Radiation Oncology

## 2019-05-14 ENCOUNTER — Other Ambulatory Visit: Payer: Self-pay

## 2019-05-14 ENCOUNTER — Other Ambulatory Visit: Payer: Self-pay | Admitting: Hospice and Palliative Medicine

## 2019-05-14 DIAGNOSIS — Z510 Encounter for antineoplastic radiation therapy: Secondary | ICD-10-CM | POA: Diagnosis not present

## 2019-05-14 DIAGNOSIS — C689 Malignant neoplasm of urinary organ, unspecified: Secondary | ICD-10-CM | POA: Diagnosis not present

## 2019-05-14 DIAGNOSIS — C7951 Secondary malignant neoplasm of bone: Secondary | ICD-10-CM | POA: Diagnosis not present

## 2019-05-14 MED ORDER — NALOXEGOL OXALATE 12.5 MG PO TABS: 12.5000 mg | ORAL_TABLET | Freq: Every day | ORAL | 2 refills | Status: AC

## 2019-05-14 NOTE — Progress Notes (Signed)
Patient pre screened today he states he has questions for the Doctor but will ask them himself. He also stated he was still taking lotensin which shows as discontinued on his medication list.

## 2019-05-14 NOTE — Progress Notes (Signed)
I spoke with patient's niece by phone.  He continues to have difficulty with constipation despite increasing his daily bowel regimen.  He has been taking Senokot, MiraLAX, mag citrate, Dulcolax suppositories, and still requiring frequent enemas.  Presumably, constipation is opioid induced.  Niece is interested in trial of a peripheral opioid antagonist.  Will start Movantik.  Patient also continues to have urinary frequency but denies fever or chills.  Denies burning.  He has been referred to ID and has an appointment with him scheduled later this week.  Discussed with Dr. Delaine Lame and will try to reschedule ID appointment for tomorrow morning.  Plan: -Movantik 12.5mg  daily -D/C other laxatives -ID referral  Case and plan discussed with Dr. Grayland Ormond.

## 2019-05-15 ENCOUNTER — Inpatient Hospital Stay (HOSPITAL_BASED_OUTPATIENT_CLINIC_OR_DEPARTMENT_OTHER): Payer: PPO | Admitting: Oncology

## 2019-05-15 ENCOUNTER — Other Ambulatory Visit: Payer: Self-pay

## 2019-05-15 ENCOUNTER — Telehealth (INDEPENDENT_AMBULATORY_CARE_PROVIDER_SITE_OTHER): Payer: Self-pay | Admitting: Vascular Surgery

## 2019-05-15 ENCOUNTER — Encounter: Payer: Self-pay | Admitting: Infectious Diseases

## 2019-05-15 ENCOUNTER — Ambulatory Visit
Admission: RE | Admit: 2019-05-15 | Discharge: 2019-05-15 | Disposition: A | Discharge status: Home / Self Care | Payer: PPO | Source: Ambulatory Visit | Attending: Radiation Oncology | Admitting: Radiation Oncology

## 2019-05-15 ENCOUNTER — Ambulatory Visit: Payer: PPO | Attending: Infectious Diseases | Admitting: Infectious Diseases

## 2019-05-15 ENCOUNTER — Inpatient Hospital Stay: Payer: PPO

## 2019-05-15 ENCOUNTER — Inpatient Hospital Stay (HOSPITAL_BASED_OUTPATIENT_CLINIC_OR_DEPARTMENT_OTHER): Payer: PPO | Admitting: Hospice and Palliative Medicine

## 2019-05-15 VITALS — BP 100/62 | HR 81

## 2019-05-15 VITALS — BP 98/64 | HR 116 | Temp 98.3°F | Ht 73.0 in

## 2019-05-15 VITALS — BP 95/53 | HR 100 | Temp 97.4°F

## 2019-05-15 DIAGNOSIS — N1330 Unspecified hydronephrosis: Secondary | ICD-10-CM | POA: Diagnosis not present

## 2019-05-15 DIAGNOSIS — C689 Malignant neoplasm of urinary organ, unspecified: Secondary | ICD-10-CM

## 2019-05-15 DIAGNOSIS — Z881 Allergy status to other antibiotic agents status: Secondary | ICD-10-CM

## 2019-05-15 DIAGNOSIS — Z888 Allergy status to other drugs, medicaments and biological substances status: Secondary | ICD-10-CM

## 2019-05-15 DIAGNOSIS — N138 Other obstructive and reflux uropathy: Secondary | ICD-10-CM

## 2019-05-15 DIAGNOSIS — G893 Neoplasm related pain (acute) (chronic): Secondary | ICD-10-CM

## 2019-05-15 DIAGNOSIS — Z515 Encounter for palliative care: Secondary | ICD-10-CM

## 2019-05-15 DIAGNOSIS — C688 Malignant neoplasm of overlapping sites of urinary organs: Secondary | ICD-10-CM

## 2019-05-15 DIAGNOSIS — R351 Nocturia: Secondary | ICD-10-CM | POA: Diagnosis not present

## 2019-05-15 DIAGNOSIS — N390 Urinary tract infection, site not specified: Secondary | ICD-10-CM | POA: Diagnosis not present

## 2019-05-15 DIAGNOSIS — T402X5S Adverse effect of other opioids, sequela: Secondary | ICD-10-CM

## 2019-05-15 DIAGNOSIS — Z1611 Resistance to penicillins: Secondary | ICD-10-CM | POA: Diagnosis not present

## 2019-05-15 DIAGNOSIS — R8271 Bacteriuria: Secondary | ICD-10-CM

## 2019-05-15 DIAGNOSIS — Z9079 Acquired absence of other genital organ(s): Secondary | ICD-10-CM

## 2019-05-15 DIAGNOSIS — N401 Enlarged prostate with lower urinary tract symptoms: Secondary | ICD-10-CM | POA: Diagnosis not present

## 2019-05-15 DIAGNOSIS — Z510 Encounter for antineoplastic radiation therapy: Secondary | ICD-10-CM | POA: Diagnosis not present

## 2019-05-15 DIAGNOSIS — K5903 Drug induced constipation: Secondary | ICD-10-CM | POA: Diagnosis not present

## 2019-05-15 DIAGNOSIS — Z1623 Resistance to quinolones and fluoroquinolones: Secondary | ICD-10-CM | POA: Diagnosis not present

## 2019-05-15 DIAGNOSIS — N189 Chronic kidney disease, unspecified: Secondary | ICD-10-CM

## 2019-05-15 DIAGNOSIS — B9620 Unspecified Escherichia coli [E. coli] as the cause of diseases classified elsewhere: Secondary | ICD-10-CM

## 2019-05-15 DIAGNOSIS — R339 Retention of urine, unspecified: Secondary | ICD-10-CM

## 2019-05-15 DIAGNOSIS — Z86718 Personal history of other venous thrombosis and embolism: Secondary | ICD-10-CM

## 2019-05-15 DIAGNOSIS — D649 Anemia, unspecified: Secondary | ICD-10-CM

## 2019-05-15 DIAGNOSIS — C679 Malignant neoplasm of bladder, unspecified: Secondary | ICD-10-CM

## 2019-05-15 DIAGNOSIS — Z960 Presence of urogenital implants: Secondary | ICD-10-CM

## 2019-05-15 DIAGNOSIS — C7951 Secondary malignant neoplasm of bone: Secondary | ICD-10-CM

## 2019-05-15 DIAGNOSIS — Z87891 Personal history of nicotine dependence: Secondary | ICD-10-CM

## 2019-05-15 DIAGNOSIS — Z880 Allergy status to penicillin: Secondary | ICD-10-CM

## 2019-05-15 DIAGNOSIS — Z87448 Personal history of other diseases of urinary system: Secondary | ICD-10-CM

## 2019-05-15 DIAGNOSIS — C8330 Diffuse large B-cell lymphoma, unspecified site: Secondary | ICD-10-CM

## 2019-05-15 DIAGNOSIS — Z8619 Personal history of other infectious and parasitic diseases: Secondary | ICD-10-CM

## 2019-05-15 LAB — CBC WITH DIFFERENTIAL/PLATELET
Abs Immature Granulocytes: 0.09 10*3/uL — ABNORMAL HIGH (ref 0.00–0.07)
Basophils Absolute: 0 10*3/uL (ref 0.0–0.1)
Basophils Relative: 0 %
Eosinophils Absolute: 0 10*3/uL (ref 0.0–0.5)
Eosinophils Relative: 1 %
HCT: 28.3 % — ABNORMAL LOW (ref 39.0–52.0)
Hemoglobin: 9.1 g/dL — ABNORMAL LOW (ref 13.0–17.0)
Immature Granulocytes: 2 %
Lymphocytes Relative: 6 %
Lymphs Abs: 0.4 10*3/uL — ABNORMAL LOW (ref 0.7–4.0)
MCH: 28.3 pg (ref 26.0–34.0)
MCHC: 32.2 g/dL (ref 30.0–36.0)
MCV: 87.9 fL (ref 80.0–100.0)
Monocytes Absolute: 0.7 10*3/uL (ref 0.1–1.0)
Monocytes Relative: 11 %
Neutro Abs: 5 10*3/uL (ref 1.7–7.7)
Neutrophils Relative %: 80 %
Platelets: 139 10*3/uL — ABNORMAL LOW (ref 150–400)
RBC: 3.22 MIL/uL — ABNORMAL LOW (ref 4.22–5.81)
RDW: 18.5 % — ABNORMAL HIGH (ref 11.5–15.5)
WBC: 6.2 10*3/uL (ref 4.0–10.5)
nRBC: 0 % (ref 0.0–0.2)

## 2019-05-15 LAB — BASIC METABOLIC PANEL
Anion gap: 14 (ref 5–15)
BUN: 37 mg/dL — ABNORMAL HIGH (ref 8–23)
CO2: 25 mmol/L (ref 22–32)
Calcium: 8.7 mg/dL — ABNORMAL LOW (ref 8.9–10.3)
Chloride: 93 mmol/L — ABNORMAL LOW (ref 98–111)
Creatinine, Ser: 1.88 mg/dL — ABNORMAL HIGH (ref 0.61–1.24)
GFR calc Af Amer: 37 mL/min — ABNORMAL LOW (ref >60)
GFR calc non Af Amer: 32 mL/min — ABNORMAL LOW (ref >60)
Glucose, Bld: 134 mg/dL — ABNORMAL HIGH (ref 70–99)
Potassium: 3.9 mmol/L (ref 3.5–5.1)
Sodium: 132 mmol/L — ABNORMAL LOW (ref 135–145)

## 2019-05-15 MED ORDER — OXYCODONE HCL 5 MG PO TABS
10.0000 mg | ORAL_TABLET | Freq: Once | ORAL | Status: AC
  Administered 2019-05-15: 11:00:00 10 mg via ORAL
  Filled 2019-05-15: qty 2

## 2019-05-15 MED ORDER — SODIUM CHLORIDE 0.9 % IV SOLN
Freq: Once | INTRAVENOUS | Status: AC
  Administered 2019-05-15: 11:00:00 via INTRAVENOUS
  Filled 2019-05-15: qty 250

## 2019-05-15 NOTE — Progress Notes (Addendum)
[place]  Telephone:([phone] [phone]   Name: Bryan Nelson Date: 05/15/2019 MRN: [id number]  DOB: [birth year]  Patient Care Team: [name], MD as PCP - General (Family Medicine) Dingeldein, Remo Lipps, MD as Consulting Physician (Ophthalmology) Hollice Espy, MD as Consulting Physician (Urology) Yolonda Kida, MD as Consulting Physician (Cardiology) Laneta Simmers as Physician Assistant (Urology)    REASON FOR CONSULTATION: Palliative Care consult requested for this 85 y.o. male with multiple medical problems including history of diffuse large B-cell lymphoma (initially diagnosed August 2015) who is status post 6 cycles of R-CHOP chemotherapy.  Abdominal CT scan February 01, 2019 revealed a 5 cm soft tissue mass along the left pelvic sidewall obstructing the left ureter.  Biopsy revealed high-grade urothelial carcinoma with squamous differentiation.  Patient was referred to palliative care to help address goals.   SOCIAL HISTORY:     reports that he quit smoking about 47 years ago. His smoking use included cigarettes. He has a 18.00 pack-year smoking history. He has never used smokeless tobacco. He reports that he does not drink alcohol or use drugs.   Patient is a widower.  He has no children.  He lives at home alone.  Patient's nephew is involved in his care.  ADVANCE DIRECTIVES:  Patient reports that his nephew is his healthcare power of attorney  CODE STATUS: DNR (04/25/2019)  PAST MEDICAL HISTORY: Past Medical History:  Diagnosis Date   Arteriosclerosis of coronary artery 08/26/2011   Overview:      a.  1999 PCI of the mid LAD with stent.      b.  2002 Cath [facility]: EF 56%. RCA-distal 25/25%. Left main-50% ostial.  Left circumflex-25% OM2.  LAD-25% proximal.  75% mid.  25/25% distal. 75% D1.      c.  07/2011 PCI of RCA with DES.[place]    Bleeding gastrointestinal 08/26/2011   Overview:      a.   Chronic abdominal pain, present improving.      b.  Duodenitis and gastritis by EGD in 2000.      c.  Pylori in 2000, treated.      d.  Gastroesophageal reflux disease.      e.  Diverticular disease.     BP (high blood pressure) 08/26/2011   Closed dislocation of right ankle 09/10/2016   Successfully reduced in ER 09/10/2016   Diffuse large B-cell lymphoma ([place]) 2015   chemo tx's   GERD (gastroesophageal reflux disease)    Heart disease    Helicobacter pylori infection 06/18/2015   by EGD RX 08/18/1999    History of adenomatous polyp of colon 06/18/2015   HOH (hard of hearing)    Bilateral Hearing  Aids   Malleolar fracture (Right) 09/10/2016    PAST SURGICAL HISTORY:  Past Surgical History:  Procedure Laterality Date   ANGIOPLASTY     CATARACT EXTRACTION     COLONOSCOPY  2016   CORONARY ANGIOPLASTY     CYSTOSCOPY WITH STENT PLACEMENT Left 03/17/2019   Procedure: CYSTOSCOPY WITH STENT PLACEMENT;  Surgeon: Festus Aloe, MD;  Location: [facility];  Service: Urology;  Laterality: Left;   EYE SURGERY Bilateral    Cataract Extraction with IOL   GREEN LIGHT LASER TURP (TRANSURETHRAL RESECTION OF PROSTATE N/A 04/16/2015   Procedure: GREEN LIGHT LASER TURP (TRANSURETHRAL RESECTION OF PROSTATE WITH BLADDER BIOPSY;  Surgeon: Collier Flowers, MD;  Location: [facility];  Service: Urology;  Laterality: N/A;  heart stent placement     1998, 2000, 2012   PERIPHERAL VASCULAR THROMBECTOMY Left 03/22/2019   Procedure: PERIPHERAL VASCULAR THROMBECTOMY and possible IVC filter;  Surgeon: Algernon Huxley, MD;  Location: [place];  Service: Cardiovascular;  Laterality: Left;   TRANSURETHRAL RESECTION OF PROSTATE N/A 03/02/2017   Procedure: TRANSURETHRAL RESECTION OF THE PROSTATE (TURP);  Surgeon: Hollice Espy, MD;  Location: [facility];  Service: Urology;  Laterality: N/A;    HEMATOLOGY/ONCOLOGY HISTORY:  Oncology History Overview Note   Diffuse Large B Cell Lymphoma: Patient was  initially diagnosed in August 2015, pathology was noted to be anaplastic subtype.  He completed 6 cycles of R-CHOP chemotherapy in January 2016. PET scan January 2017 revealed no evidence of disease.  2.  Urothelial carcinoma: Confirmed by biopsy.  Patient's most recent CT scan on March 01, 2019 reviewed independently and reported as above with progressive left pelvic mass greater than 4 cm.  Patient appears to have no other evidence of disease.  Case discussed with urology.  Given  the fact that patient is symptomatic with pain and has localized disease, he will benefit from concurrent chemotherapy using weekly cisplatin 40 mg/m along with daily XRT.  Delay cycle 1 of cisplatin today secondary to elevated creatinine.  Continue daily XRT.  Return to clinic in 1 week for further evaluation and reconsideration of cycle 2.     DLBCL (diffuse large B cell lymphoma) ([place])  04/10/2014 Initial Diagnosis   DLBCL (diffuse large B cell lymphoma) ([facility])     ALLERGIES:  is allergic to augmentin [amoxicillin-pot clavulanate]; ciprofloxacin; lisinopril; penicillins; bactrim [sulfamethoxazole-trimethoprim]; and sulfa antibiotics.  MEDICATIONS:  Current Outpatient Medications  Medication Sig Dispense Refill   apixaban (ELIQUIS) 5 MG TABS tablet Take 1 tablet (5 mg total) by mouth 2 (two) times daily. 60 tablet 3   atorvastatin (LIPITOR) 10 MG tablet Take 1 tablet (10 mg total) by mouth daily. 30 tablet 11   Cholecalciferol (VITAMIN D3) 1000 units CAPS Take 1,000 Units by mouth daily.     famotidine (PEPCID) 20 MG tablet Take 1 tablet (20 mg total) by mouth daily. 180 tablet 3   fentaNYL (DURAGESIC) 50 MCG/HR Place 1 patch onto the skin every 3 (three) days. 5 patch 0   finasteride (PROSCAR) 5 MG tablet Take 1 tablet (5 mg total) by mouth daily. 90 tablet 3   hydrochlorothiazide (HYDRODIURIL) 25 MG tablet Take 0.5 tablets (12.5 mg total) by mouth daily.     isosorbide dinitrate (ISORDIL) 30 MG tablet Take  30 mg by mouth daily.     Magnesium 250 MG TABS Take 250 mg by mouth daily.     metoprolol succinate (TOPROL-XL) 25 MG 24 hr tablet Take 1 tablet (25 mg total) by mouth daily. 90 tablet 4   mirabegron ER (MYRBETRIQ) 25 MG TB24 tablet Take 1 tablet (25 mg total) by mouth daily. 30 tablet 5   naloxegol oxalate (MOVANTIK) 12.5 MG TABS tablet Take 1 tablet (12.5 mg total) by mouth daily. 30 tablet 2   nitrofurantoin, macrocrystal-monohydrate, (MACROBID) 100 MG capsule Take 1 capsule (100 mg total) by mouth 2 (two) times daily. 20 capsule 0   ondansetron (ZOFRAN) 8 MG tablet Take 1 tablet (8 mg total) by mouth 2 (two) times daily as needed for nausea or vomiting. 20 tablet 3   Oxycodone HCl 10 MG TABS Take 1-2 tablets (10-20 mg total) by mouth every 4 (four) hours as needed. 90 tablet 0   polyethylene glycol powder (  CLEARLAX) 17 GM/SCOOP powder Take 1 Container by mouth daily.     potassium chloride SA (K-DUR) 20 MEQ tablet Take 1 tablet (20 mEq total) by mouth daily. 2 tablet 0   prochlorperazine (COMPAZINE) 10 MG tablet Take 1 tablet (10 mg total) by mouth every 6 (six) hours as needed for nausea or vomiting. 30 tablet 3   psyllium (METAMUCIL) 58.6 % packet Take 1 packet by mouth daily.     senna (SENOKOT) 8.6 MG TABS tablet Take 1 tablet (8.6 mg total) by mouth daily as needed for mild constipation. 120 tablet 0   traZODone (DESYREL) 50 MG tablet Take 0.5-1 tablets (25-50 mg total) by mouth at bedtime. 30 tablet 0   No current facility-administered medications for this visit.    Facility-Administered Medications Ordered in Other Visits  Medication Dose Route Frequency Provider Last Rate Last Dose   sodium chloride flush (NS) 0.9 % injection 10 mL  10 mL Intravenous Once Lety Cullens, Kirt Boys, NP        VITAL SIGNS: There were no vitals taken for this visit. There were no vitals filed for this visit.  Estimated body mass index is 29.42 kg/m as calculated from the following:   Height  as of an earlier encounter on 05/15/19: 6\' 1"  (8.841 m).   Weight as of 05/07/19: 223 lb (101.2 kg).  LABS: CBC:    Component Value Date/Time   WBC 6.2 05/15/2019 0921   HGB 9.1 (L) 05/15/2019 0921   HGB 13.1 08/10/2018 0821   HCT 28.3 (L) 05/15/2019 0921   HCT 38.9 08/10/2018 0821   PLT 139 (L) 05/15/2019 0921   PLT 240 08/10/2018 0821   MCV 87.9 05/15/2019 0921   MCV 88 08/10/2018 0821   MCV 91 09/16/2014 0928   NEUTROABS 5.0 05/15/2019 0921   NEUTROABS 2.4 05/07/2016 1055   NEUTROABS 2.3 09/16/2014 0928   LYMPHSABS 0.4 (L) 05/15/2019 0921   LYMPHSABS 0.8 05/07/2016 1055   LYMPHSABS 0.5 (L) 09/16/2014 0928   MONOABS 0.7 05/15/2019 0921   MONOABS 0.7 09/16/2014 0928   EOSABS 0.0 05/15/2019 0921   EOSABS 0.5 (H) 05/07/2016 1055   EOSABS 0.2 09/16/2014 0928   BASOSABS 0.0 05/15/2019 0921   BASOSABS 0.0 05/07/2016 1055   BASOSABS 0.1 09/16/2014 0928   Comprehensive Metabolic Panel:    Component Value Date/Time   NA 132 (L) 05/15/2019 0921   NA 140 11/09/2018 0908   NA 144 09/16/2014 0928   K 3.9 05/15/2019 0921   K 3.5 09/16/2014 0928   CL 93 (L) 05/15/2019 0921   CL 105 09/16/2014 0928   CO2 25 05/15/2019 0921   CO2 31 09/16/2014 0928   BUN 37 (H) 05/15/2019 0921   BUN 19 11/09/2018 0908   BUN 17 09/16/2014 0928   CREATININE 1.88 (H) 05/15/2019 0921   CREATININE 1.01 09/16/2014 0928   GLUCOSE 134 (H) 05/15/2019 0921   GLUCOSE 106 (H) 09/16/2014 0928   CALCIUM 8.7 (L) 05/15/2019 0921   CALCIUM 8.4 (L) 09/16/2014 0928   AST 21 05/07/2019 1257   AST 20 09/16/2014 0928   ALT 26 05/07/2019 1257   ALT 33 09/16/2014 0928   ALKPHOS 78 05/07/2019 1257   ALKPHOS 76 09/16/2014 0928   BILITOT 0.3 05/07/2019 1257   BILITOT 0.3 11/09/2018 0908   BILITOT 0.3 09/16/2014 0928   PROT 7.2 05/07/2019 1257   PROT 6.5 11/09/2018 0908   PROT 6.3 (L) 09/16/2014 0928   ALBUMIN 3.4 (L) 05/07/2019 1257  ALBUMIN 4.3 11/09/2018 0908   ALBUMIN 3.4 09/16/2014 0786    RADIOGRAPHIC  STUDIES: Ct Abdomen Pelvis W Contrast  Result Date: 05/02/2019 CLINICAL DATA:  Abdominal pain. Left pelvic urothelial carcinoma status post concurrent chemotherapy and radiation therapy. Restaging. Additional history of diffuse large B-cell lymphoma diagnosed in 2015 status post chemotherapy. EXAM: CT ABDOMEN AND PELVIS WITH CONTRAST TECHNIQUE: Multidetector CT imaging of the abdomen and pelvis was performed using the standard protocol following bolus administration of intravenous contrast. CONTRAST:  11mL OMNIPAQUE IOHEXOL 300 MG/ML  SOLN COMPARISON:  03/01/2019 CT abdomen/pelvis. FINDINGS: Lower chest: No significant pulmonary nodules or acute consolidative airspace disease. Coronary atherosclerosis. Hepatobiliary: Normal liver size. No liver mass. Normal gallbladder with no radiopaque cholelithiasis. No biliary ductal dilatation. Pancreas: Normal, with no mass or duct dilation. Spleen: Normal size. No mass. Adrenals/Urinary Tract: Normal adrenals. Left nephroureteral stent is well positioned with the proximal pigtail portion in the upper left renal collecting system and the distal pigtail portion in posterior bladder lumen. No hydronephrosis. Simple bilateral renal cysts, largest 4.9 cm in the posterior upper right kidney. There is an infiltrative heterogeneously enhancing 5.0 x 4.5 cm upper left pelvic mass encasing the upper left pelvic ureter and partially encasing the proximal left external iliac vein with associated new left common and external iliac vein stent (series 2/image 67), previously 5.2 x 4.5 cm using similar measurement technique, not substantially changed. This mass is also intimately associated with the superior margin of a left posterior bladder diverticulum. Relatively collapsed bladder. No bladder stones. Stomach/Bowel: Normal non-distended stomach. Normal caliber small bowel with no small bowel wall thickening. Normal appendix. Oral contrast transits to the colon. Moderate left colonic  diverticulosis, with no large bowel wall thickening or acute pericolonic fat stranding. Vascular/Lymphatic: Atherosclerotic abdominal aorta with 3.1 cm infrarenal abdominal aortic aneurysm, stable. Patent portal, splenic, hepatic and renal veins. No pathologically enlarged lymph nodes in the abdomen or pelvis. Reproductive: Top-normal size prostate. Other: No pneumoperitoneum, ascites or focal fluid collection. Musculoskeletal: There is a mildly expansile destructive lytic osseous lesion in the posterior right T12 elements (series 2/image 19), essentially new, measuring approximately 3.7 x 2.2 x 4.0 cm. Moderate thoracolumbar spondylosis. Chronic mild-to-moderate T12 vertebral compression fracture. IMPRESSION: 1. Destructive lytic osseous metastasis in the right T12 posterior elements, essentially new. 2. Locally infiltrative left pelvic tumor encasing the upper left pelvic ureter has not substantially changed in size. 3. Well-positioned left nephroureteral stent with no residual left hydronephrosis. 4. Infrarenal 3.1 cm Abdominal Aortic Aneurysm (ICD10-I71.9). Recommend follow-up aortic ultrasound in 3 years. This recommendation follows ACR consensus guidelines: White Paper of the ACR Incidental Findings Committee II on Vascular Findings. J Am Coll Radiol 2013; 75:449-201. 5.  Aortic Atherosclerosis (ICD10-I70.0). Electronically Signed   By: Ilona Sorrel M.D.   On: 05/02/2019 12:36   Dg Abd 2 Views  Result Date: 05/05/2019 CLINICAL DATA:  Constipated EXAM: ABDOMEN - 2 VIEW COMPARISON:  CT 05/02/2019 FINDINGS: Lung bases are clear. Left ureteral stent remains in place. Nonobstructed bowel gas pattern with large amount of stool throughout the colon. Stool mixed with residual enteral contrast within the colon and rectum. Left iliac stent IMPRESSION: Nonobstructed gas pattern with large amount of stool throughout the colon. Stool mixed with residual enteral contrast in the distal colon and rectum Electronically  Signed   By: Donavan Foil M.D.   On: 05/05/2019 20:49    PERFORMANCE STATUS (ECOG) : 1 - Symptomatic but completely ambulatory  Review of Systems Unless otherwise noted, a  complete review of systems is negative.  Physical Exam General: NAD, frail appearing, thin Pulmonary: Unlabored Extremities: no edema Skin: no rashes Neurological: Weakness but otherwise nonfocal  IMPRESSION: Routine follow-up visit today in the clinic.  Patient saw Dr. Delaine Lame this morning, who discussed treatment options with Dr. Erlene Quan, Dr. Grayland Ormond and myself.  It is felt the patient is likely colonized and would not benefit from antibiotics and less he develops fever or chills.  Plan is for self-catheterization to ensure full bladder emptying.  Patient's pain seems to be improved although he still requires as needed oxycodone.  Niece tells me that he has required significantly less oxycodone over the past week.  Symptomatically, he is most distressed by constipation and urinary frequency.  I sent a prescription yesterday to start Agar for opioid-induced constipation and is my hope that that will help relieve symptoms and improve his quality of life.  Patient asked about his end-of-life process.  He says that he fears being a burden to his family.  He denies SI.  I note plan is to hire caregivers in the home per niece.  I spoke with niece by phone.  Case and plan discussed with Dr. Grayland Ormond.  PLAN: -Continue current scope of treatment -oxycodone 10mg  Q4H prn for BTP -Fentanyl TD 40mcg Q72H -Movantik 12.5 mg daily.  Will increase to 25 mg daily if needed.  -RTC in 1 week   Patient expressed understanding and was in agreement with this plan. He also understands that He can call the clinic at any time with any questions, concerns, or complaints.     Time Total: 20 minutes  Visit consisted of counseling and education dealing with the complex and emotionally intense issues of symptom  management and palliative care in the setting of serious and potentially life-threatening illness.Greater than 50%  of this time was spent counseling and coordinating care related to the above assessment and plan.  Signed by: Altha Harm, PhD, NP-C [phone] (Work Cell)

## 2019-05-15 NOTE — Progress Notes (Signed)
NAME: Bryan Nelson  DOB: [birth year]  MRN: [id number]  Date/Time: 05/15/2019 8:23 AM  REQUESTING PROVIDER Altha Harm Subjective:  REASON FOR CONSULT: UTI with E. coli ?Patient here with his niece.  History from the chart and some from the patient. Bryan Nelson is a 85 y.o.male with large B-cell lymphoma diagnosed August 2015 and status post 6 cycles of R-CHOP,a complicated genitourinary history,  recently diagnosed urothelial carcinoma with left hydronephrosis status post stent is here to see me because his urine has E. coli which is resistant to pretty much all the oral antibiotics. Patient's main complaint is frequent urination especially at nighttime.  His other complaint is constipation.  He does not have any burning when passing urine no fever no blood in the urine or pain abdomen.  He has  back pain at the mid thoracic level.  Patient has a complicated genitourinary history.  In 2015 because of BPH and urinary retention he had suprapubic catheter for a period of time.  That was removed and he was asked to do self-catheterization.  And then in August 2016 he underwent cystoscopy and greenlight laser ablation prostatectomy by Dr. Elnoria Howard.  Even after the surgery patient was having trouble passing urine and used to self catheterize at least 3 times a day.  Then in 2018 and 1 of the CT abdomen there was a large bladder diverticulum noted along with prostate hypertrophy and obstruction. In July 2018 he underwent TURP by Dr. Erlene Quan. He has been followed by urology closely and has had multiple courses of antibiotics for bacteria found in the urine pretty much all the time since 2017.    He has had enterococcus, Citrobacter, Pseudomonas, E. coli and Klebsiella.  He has never had bacteria in his blood. June 2020 he was complaining of left lower abdominal pain and a CAT scan that was done on 02/01/2019 showed an irregular 4.9 and 5 cm soft tissue lesion along the left pelvic sidewall incorporating and  obstructing the left ureter.  This also appeared to involve the posterior left a bladder diverticulum and sigmoid colon. He underwent IR guided biopsy of the pelvic mass on 02/21/2019 And it was an invasive high-grade urothelial carcinoma with squamous differentiation. He Started radiation therapy March 06, 2019 and also weekly cisplatin palliative therapy.  Was followed by Dr. Grayland Ormond. On 03/17/2019 he had a cystoscopy and left ureteral stent placement by Dr. Junious Silk.  He also was found to have extensive left lower extremity DVT and underwent on 03/22/2019 catheter directed thrombolysis of 4 mg TPA to the left iliac vein, mechanical thrombectomy to the left external iliac vein and common iliac vein and then stent placement to the left common and external iliac veins by Dr.Dew.  He also has T12 mets and back pain due to that and has been on oxycodone and dexamethasone.  He received radiation therapy for that as well. Patient meanwhile stopped doing intermittent catheterization for the past few months.  He now has frequent urination especially at nighttime. He also has constipation which is opioid induced and was on MiraLAX and now started on Movantik.  Past Medical History:  Diagnosis Date  . Arteriosclerosis of coronary artery 08/26/2011   Overview:      a.  1999 PCI of the mid LAD with stent.      b.  2002 [facility]: EF 56%. RCA-distal 25/25%. Left main-50% ostial.  Left circumflex-25% OM2.  LAD-25% proximal.  75% mid.  25/25% distal. 75% D1.  c.  07/2011 PCI of RCA with DES.[facility] [place]   . Bleeding gastrointestinal 08/26/2011   Overview:      a.  Chronic abdominal pain, present improving.      b.  Duodenitis and gastritis by EGD in 2000.      c.  Pylori in 2000, treated.      d.  Gastroesophageal reflux disease.      e.  Diverticular disease.    . BP (high blood pressure) 08/26/2011  . Closed dislocation of right ankle 09/10/2016   Successfully reduced in ER 09/10/2016  . Diffuse large B-cell lymphoma  ([place]) 2015   chemo tx's  . GERD (gastroesophageal reflux disease)   . Heart disease   . Helicobacter pylori infection 06/18/2015   by EGD RX 08/18/1999   . History of adenomatous polyp of colon 06/18/2015  . HOH (hard of hearing)    Bilateral Hearing  Aids  . Malleolar fracture (Right) 09/10/2016    Past Surgical History:  Procedure Laterality Date  . ANGIOPLASTY    . CATARACT EXTRACTION    . COLONOSCOPY  2016  . CORONARY ANGIOPLASTY    . CYSTOSCOPY WITH STENT PLACEMENT Left 03/17/2019   Procedure: CYSTOSCOPY WITH STENT PLACEMENT;  Surgeon: Festus Aloe, MD;  Location: [facility];  Service: Urology;  Laterality: Left;  . EYE SURGERY Bilateral    Cataract Extraction with IOL  . GREEN LIGHT LASER TURP (TRANSURETHRAL RESECTION OF PROSTATE N/A 04/16/2015   Procedure: GREEN LIGHT LASER TURP (TRANSURETHRAL RESECTION OF PROSTATE WITH BLADDER BIOPSY;  Surgeon: Collier Flowers, MD;  Location: [facility];  Service: Urology;  Laterality: N/A;  . heart stent placement     1998, 2000, 2012  . PERIPHERAL VASCULAR THROMBECTOMY Left 03/22/2019   Procedure: PERIPHERAL VASCULAR THROMBECTOMY and possible IVC filter;  Surgeon: Algernon Huxley, MD;  Location: [facility];  Service: Cardiovascular;  Laterality: Left;  . TRANSURETHRAL RESECTION OF PROSTATE N/A 03/02/2017   Procedure: TRANSURETHRAL RESECTION OF THE PROSTATE (TURP);  Surgeon: Hollice Espy, MD;  Location: [facility];  Service: Urology;  Laterality: N/A;    Social History   Socioeconomic History  . Marital status: Widowed    Spouse name: Not on file  . Number of children: 0  . Years of education: Not on file  . Highest education level: 12th grade  Occupational History  . Occupation: retired  Scientific laboratory technician  . Financial resource strain: Not very hard  . Food insecurity    Worry: Never true    Inability: Never true  . Transportation needs    Medical: No    Non-medical: No  Tobacco Use  . Smoking status: Former Smoker     Packs/day: 1.50    Years: 12.00    Pack years: 18.00    Types: Cigarettes    Quit date: 08/24/1971    Years since quitting: 47.7  . Smokeless tobacco: Never Used  Substance and Sexual Activity  . Alcohol use: No    Alcohol/week: 0.0 standard drinks  . Drug use: No  . Sexual activity: Not on file  Lifestyle  . Physical activity    Days per week: 0 days    Minutes per session: 0 min  . Stress: Not at all  Relationships  . Social Herbalist on phone: Patient refused    Gets together: Patient refused    Attends religious service: Patient refused    Active member of club or organization: Patient refused    Attends  meetings of clubs or organizations: Patient refused    Relationship status: Patient refused  . Intimate partner violence    Fear of current or ex partner: Patient refused    Emotionally abused: Patient refused    Physically abused: Patient refused    Forced sexual activity: Patient refused  Other Topics Concern  . Not on file  Social History Narrative  . Not on file    Family History  Problem Relation Age of Onset  . Osteoporosis Mother   . Alzheimer's disease Father   . Kidney disease Neg Hx   . Prostate cancer Neg Hx   . Bladder Cancer Neg Hx   . Kidney cancer Neg Hx    Allergies  Allergen Reactions  . Augmentin [Amoxicillin-Pot Clavulanate]     Tongue swelling 2 days after taking  . Ciprofloxacin Diarrhea  . Lisinopril Other (See Comments)    Unknown   . Penicillins Swelling    unknwon reaction.  tolerates amoxicillin Has patient had a PCN reaction causing immediate rash, facial/tongue/throat swelling, SOB or lightheadedness with hypotension: No Has patient had a PCN reaction causing severe rash involving mucus membranes or skin necrosis: No Has patient had a PCN reaction that required hospitalization: No Has patient had a PCN reaction occurring within the last 10 years: Yes If all of the above answers are "NO", then may proceed with  Cephalosporin use.   . Bactrim [Sulfamethoxazole-Trimethoprim] Rash  . Sulfa Antibiotics Itching and Rash    ? Current Outpatient Medications  Medication Sig Dispense Refill  . apixaban (ELIQUIS) 5 MG TABS tablet Take 1 tablet (5 mg total) by mouth 2 (two) times daily. 60 tablet 3  . atorvastatin (LIPITOR) 10 MG tablet Take 1 tablet (10 mg total) by mouth daily. 30 tablet 11  . Cholecalciferol (VITAMIN D3) 1000 units CAPS Take 1,000 Units by mouth daily.    . famotidine (PEPCID) 20 MG tablet Take 1 tablet (20 mg total) by mouth daily. 180 tablet 3  . fentaNYL (DURAGESIC) 50 MCG/HR Place 1 patch onto the skin every 3 (three) days. 5 patch 0  . finasteride (PROSCAR) 5 MG tablet Take 1 tablet (5 mg total) by mouth daily. 90 tablet 3  . hydrochlorothiazide (HYDRODIURIL) 25 MG tablet Take 0.5 tablets (12.5 mg total) by mouth daily.    . isosorbide dinitrate (ISORDIL) 30 MG tablet Take 30 mg by mouth daily.    . Magnesium 250 MG TABS Take 250 mg by mouth daily.    . metoprolol succinate (TOPROL-XL) 25 MG 24 hr tablet Take 1 tablet (25 mg total) by mouth daily. 90 tablet 4  . mirabegron ER (MYRBETRIQ) 25 MG TB24 tablet Take 1 tablet (25 mg total) by mouth daily. 30 tablet 5  . naloxegol oxalate (MOVANTIK) 12.5 MG TABS tablet Take 1 tablet (12.5 mg total) by mouth daily. 30 tablet 2  . nitrofurantoin, macrocrystal-monohydrate, (MACROBID) 100 MG capsule Take 1 capsule (100 mg total) by mouth 2 (two) times daily. 20 capsule 0  . ondansetron (ZOFRAN) 8 MG tablet Take 1 tablet (8 mg total) by mouth 2 (two) times daily as needed for nausea or vomiting. 20 tablet 3  . Oxycodone HCl 10 MG TABS Take 1-2 tablets (10-20 mg total) by mouth every 4 (four) hours as needed. 90 tablet 0  . polyethylene glycol powder (CLEARLAX) 17 GM/SCOOP powder Take 1 Container by mouth daily.    . potassium chloride SA (K-DUR) 20 MEQ tablet Take 1 tablet (20 mEq total) by mouth  daily. 2 tablet 0  . prochlorperazine (COMPAZINE)  10 MG tablet Take 1 tablet (10 mg total) by mouth every 6 (six) hours as needed for nausea or vomiting. 30 tablet 3  . psyllium (METAMUCIL) 58.6 % packet Take 1 packet by mouth daily.    Marland Kitchen senna (SENOKOT) 8.6 MG TABS tablet Take 1 tablet (8.6 mg total) by mouth daily as needed for mild constipation. 120 tablet 0  . traZODone (DESYREL) 50 MG tablet Take 0.5-1 tablets (25-50 mg total) by mouth at bedtime. 30 tablet 0   No current facility-administered medications for this visit.    Facility-Administered Medications Ordered in Other Visits  Medication Dose Route Frequency Provider Last Rate Last Dose  . sodium chloride flush (NS) 0.9 % injection 10 mL  10 mL Intravenous Once Borders, Kirt Boys, NP         Abtx:  Anti-infectives (From admission, onward)   None      REVIEW OF SYSTEMS:  Const: negative fever, negative chills, negative weight loss Eyes: negative diplopia or visual changes, negative eye pain ENT: negative coryza, negative sore throat Resp: negative cough, hemoptysis, dyspnea Cards: negative for chest pain, palpitations, lower extremity edema GU: Positive for frequency, but no dysuria and hematuria GI: No abdominal pain, diarrhea, bleeding, has severe constipation Skin: negative for rash and pruritus Heme: negative for easy bruising and gum/nose bleeding MS: Fatigue, muscle weakness, back pain Neurolo:negative for headaches, dizziness, vertigo, memory problems  Psych: Patient depressed and says he does not want any more treatment Endocrine: No history of diabetes or thyroid issues Allergy/Immunology-as noted above  Objective:  VITALS:  BP 98/64 (BP Location: Left Arm, Patient Position: Sitting, Cuff Size: Large)   Pulse (!) 116   Temp 98.3 F (36.8 C) (Oral)   Ht 6\' 1"  (1.854 m)   BMI 29.42 kg/m  PHYSICAL EXAM:  General: Alert, cooperative, hard of hearing, pale in wheelchair Head: Normocephalic, without obvious abnormality, atraumatic. Eyes: Conjunctivae clear,  anicteric sclerae. Pupils are equal ENT Nares normal. No drainage or sinus tenderness. Lips, mucosa, and tongue normal. No Thrush Neck: Supple, symmetrical, no adenopathy, thyroid: non tender no carotid bruit and no JVD. Back: No CVA tenderness. Lungs: Bilateral air entry Heart: Regular rate and rhythm, no murmur, rub or gallop. Abdomen: Did not examine is in the wheelchair  extremities: atraumatic, no cyanosis. No edema. No clubbing Skin: No rashes or lesions. Or bruising Lymph: Cervical, supraclavicular normal. Neurologic: Grossly non-focal Pertinent Labs Lab Results CBC    Component Value Date/Time   WBC 6.5 05/10/2019 1330   RBC 3.13 (L) 05/10/2019 1330   HGB 8.8 (L) 05/10/2019 1330   HGB 13.1 08/10/2018 0821   HCT 27.9 (L) 05/10/2019 1330   HCT 38.9 08/10/2018 0821   PLT 123 (L) 05/10/2019 1330   PLT 240 08/10/2018 0821   MCV 89.1 05/10/2019 1330   MCV 88 08/10/2018 0821   MCV 91 09/16/2014 0928   MCH 28.1 05/10/2019 1330   MCHC 31.5 05/10/2019 1330   RDW 19.2 (H) 05/10/2019 1330   RDW 14.4 08/10/2018 0821   RDW 16.4 (H) 09/16/2014 0928   LYMPHSABS 0.2 (L) 05/10/2019 1330   LYMPHSABS 0.8 05/07/2016 1055   LYMPHSABS 0.5 (L) 09/16/2014 0928   MONOABS 0.3 05/10/2019 1330   MONOABS 0.7 09/16/2014 0928   EOSABS 0.0 05/10/2019 1330   EOSABS 0.5 (H) 05/07/2016 1055   EOSABS 0.2 09/16/2014 0928   BASOSABS 0.0 05/10/2019 1330   BASOSABS 0.0 05/07/2016 1055   BASOSABS  0.1 09/16/2014 0928    CMP Latest Ref Rng & Units 05/10/2019 05/07/2019 05/05/2019  Glucose 70 - 99 mg/dL 149(H) 144(H) 177(H)  BUN 8 - 23 mg/dL 52(H) 47(H) 52(H)  Creatinine 0.61 - 1.24 mg/dL 2.08(H) 2.04(H) 2.44(H)  Sodium 135 - 145 mmol/L 138 139 135  Potassium 3.5 - 5.1 mmol/L 3.9 3.2(L) 3.8  Chloride 98 - 111 mmol/L 105 102 97(L)  CO2 22 - 32 mmol/L 21(L) 24 21(L)  Calcium 8.9 - 10.3 mg/dL 7.8(L) 8.2(L) 8.6(L)  Total Protein 6.5 - 8.1 g/dL - 7.2 7.1  Total Bilirubin 0.3 - 1.2 mg/dL - 0.3 0.4   Alkaline Phos 38 - 126 U/L - 78 76  AST 15 - 41 U/L - 21 26  ALT 0 - 44 U/L - 26 29      Microbiology: Recent Results (from the past 240 hour(s))  Urine Culture     Status: Abnormal   Collection Time: 05/07/19  3:10 PM   Specimen: Urine, Clean Catch  Result Value Ref Range Status   Specimen Description   Final    URINE, CLEAN CATCH Performed at [facility], [address] [place], [place] [address]    Special Requests   Final    NONE Performed at [facility], [address] [place], [place] [address]    Culture >=100,000 COLONIES/mL ESCHERICHIA COLI (A)  Final   Report Status 05/09/2019 FINAL  Final   Organism ID, Bacteria ESCHERICHIA COLI (A)  Final      Susceptibility   Escherichia coli - MIC*    AMPICILLIN >=32 RESISTANT Resistant     CEFAZOLIN 32 INTERMEDIATE Intermediate     CEFTRIAXONE <=1 SENSITIVE Sensitive     CIPROFLOXACIN >=4 RESISTANT Resistant     GENTAMICIN <=1 SENSITIVE Sensitive     IMIPENEM <=0.25 SENSITIVE Sensitive     NITROFURANTOIN 64 INTERMEDIATE Intermediate     TRIMETH/SULFA >=320 RESISTANT Resistant     AMPICILLIN/SULBACTAM 4 SENSITIVE Sensitive     PIP/TAZO 8 SENSITIVE Sensitive     * >=100,000 COLONIES/mL ESCHERICHIA COLI    IMAGING RESULTS: Reviewed CT abdomen and pelvis personally I have personally reviewed the films ? Impression/Recommendation ?85 year old male with a complicated urological history with BPH, bladder diverticulum, recurrent TURP, CIC in the past, recently diagnosed urothelial carcinoma the bladder diverticulum with metastasis, on cisplatin once weekly and radiation therapy presents with frequent urination.  E. coli bacteria in the urine.  Patient is colonized with bacteria for many years now.  He has been treated with multiple courses of antibiotics in the past.  He currently has an E. coli from 05/07/2019 ?Which is resistant to ampicillin, quinolones, Bactrim and intermediate to nitrofurantoin.  Patient's  only complaint is frequency in urination especially at nighttime. I strongly suspect this is because of incomplete emptying with underlying bladder outlet obstruction as well as diverticulum and malignancy he may be retaining urine.  He has also not done any intermittent catheterization in the past few months.  More than antibiotic he will need his bladder to be emptied.  After discussing with Dr. Erlene Quan has been decided the patient will restart intermittent catheterization.  He needs to catheterize at least twice a day especially before going to bed.  Patient will monitor his temperature and his urine output.  If he has to undergo any procedure like cystoscopy or if he develops fever or frank hematuria then he will need antibiotic treatment.  Currently antibiotics will not provide much help as patient does  not have symptoms of urinary tract infection. He does have a left hydronephrosis with a stent which is another source for colonization . He is asked to stay well-hydrated.  The constipation can also be contributing to increased frequency as well as fecal bacteria colonization in the bladder Discontinue nitrofurantoin.  Bladder carcinoma with metastasis.  Getting radiation and chemotherapy.  Left hydronephrosis secondary to obstruction from the bladder tumor status post stent.  CKD secondary to the above.  Bladder outlet obstruction secondary to prostate hypertrophy on finasteride and mirabegron  Anemia  T12 mets getting radiation  Severe constipation.  Opioid induced .started on Movantik.  ___________________________________________________ Discussed with patient, and his niece.  Also communicated with oncologist Dr. Grayland Ormond and Vonna Kotyk border, urologist Dr. Erlene Quan  More than 50% of the 50-minute visit spent on counseling and coordinating his care.  I will follow along. note:  This document was prepared using Dragon voice recognition software and may include unintentional dictation  errors.

## 2019-05-15 NOTE — Telephone Encounter (Signed)
Bryan Nelson-PT with [facility] [phone] called to inform us of a missed PT apt with patient today. AS, CMA

## 2019-05-15 NOTE — Patient Instructions (Addendum)
You are here because of frequent urination and e.coli in the urine. You have urothelial cancer, left hydronephrosis and left ureteral stent- the e.coli could be colonizing the bladder and stent- also you had self catheterized many years ago and as recently as a few weeks ago. It may be prudent to do a bladder scan= will speak with heme onc to se ewhether they can do ceftriaoxne IV every day at the infusion site  I discussed with Dr.Brandon and Dr.Finnegan and the plan is for you to self catheterize twice a day and document the amount of urine you are getting out. We will avoid antibiotics currently as your symptoms of frequent urination is due to bladder diverticulum/chronic outlet obstruction with incomplete emptying  and retained urine. If your symptoms get worse following this or you start to have fever or see blood in the stool we will then give you antibiotic- At this moment antibiotics will not help much

## 2019-05-16 ENCOUNTER — Ambulatory Visit: Payer: PPO | Admitting: Physician Assistant

## 2019-05-16 ENCOUNTER — Other Ambulatory Visit: Payer: Self-pay

## 2019-05-16 ENCOUNTER — Ambulatory Visit
Admission: RE | Admit: 2019-05-16 | Discharge: 2019-05-16 | Disposition: A | Discharge status: Home / Self Care | Payer: PPO | Source: Ambulatory Visit | Attending: Radiation Oncology | Admitting: Radiation Oncology

## 2019-05-16 ENCOUNTER — Encounter: Payer: Self-pay | Admitting: Physician Assistant

## 2019-05-16 VITALS — BP 101/57 | HR 118 | Ht 73.0 in | Wt 235.0 lb

## 2019-05-16 DIAGNOSIS — R350 Frequency of micturition: Secondary | ICD-10-CM

## 2019-05-16 DIAGNOSIS — Z510 Encounter for antineoplastic radiation therapy: Secondary | ICD-10-CM | POA: Diagnosis not present

## 2019-05-16 DIAGNOSIS — N390 Urinary tract infection, site not specified: Secondary | ICD-10-CM

## 2019-05-16 LAB — URINALYSIS, COMPLETE
Bilirubin, UA: NEGATIVE
Glucose, UA: NEGATIVE
Ketones, UA: NEGATIVE
Nitrite, UA: NEGATIVE
Specific Gravity, UA: 1.02 (ref 1.005–1.030)
Urobilinogen, Ur: 0.2 mg/dL (ref 0.2–1.0)
pH, UA: 6.5 (ref 5.0–7.5)

## 2019-05-16 LAB — BLADDER SCAN AMB NON-IMAGING: Scan Result: 0

## 2019-05-16 LAB — MICROSCOPIC EXAMINATION
Bacteria, UA: NONE SEEN
RBC, Urine: NONE SEEN /hpf (ref 0–2)
WBC, UA: >30 /hpf — AB (ref 0–5)

## 2019-05-16 NOTE — Progress Notes (Signed)
05/16/2019 7:54 AM   Bryan Nelson December 18, 1931 [id number]  CC: Urinary frequency  HPI: Bryan Nelson is a 85 y.o. male who presents today for evaluation of possible UTI. He is an established [facility] patient who last saw Dr. Erlene Quan on 04/18/2019 with extensive past urological history significant for urinary retention and incomplete bladder emptying with a large bladder diverticulum and invasive high-grade urothelial carcinoma currently being managed by Dr. Grayland Ormond at the cancer center. He CICs three times daily.  He has been seen by multiple providers in the past month with complaints of urinary frequency and discomfort. He is known to be colonized with bacteria. His oncology and ID teams have recommended against the use of antibiotics unless he develops fever and chills, as his oncologic history is known to cause irritative voiding symptoms.  He reports that he has been unable to drain urine from his bladder occasionally over the past several days. Also, he reports continued urinary frequency approximately every 5 minutes. He is concerned that he is retaining urine due to his low output each time he caths.  He does have a history of recurrent UTI, most recently 8 days ago and treated with Macrobid.  In-office UA today positive for 2+ blood, 2+ protein, and 1+ leukocyte esterase; urine microscopy with >30 WBCs/HPF. PVR 25mL.  PMH: Past Medical History:  Diagnosis Date   Arteriosclerosis of coronary artery 08/26/2011   Overview:      a.  1999 PCI of the mid LAD with stent.      b.  2002 Cath [facility]: EF 56%. RCA-distal 25/25%. Left main-50% ostial.  Left circumflex-25% OM2.  LAD-25% proximal.  75% mid.  25/25% distal. 75% D1.      c.  07/2011 PCI of RCA with DES.[place]    Bleeding gastrointestinal 08/26/2011   Overview:      a.  Chronic abdominal pain, present improving.      b.  Duodenitis and gastritis by EGD in 2000.      c.  Pylori in 2000, treated.      d.  Gastroesophageal reflux  disease.      e.  Diverticular disease.     BP (high blood pressure) 08/26/2011   Closed dislocation of right ankle 09/10/2016   Successfully reduced in ER 09/10/2016   Diffuse large B-cell lymphoma ([place]) 2015   chemo tx's   GERD (gastroesophageal reflux disease)    Heart disease    Helicobacter pylori infection 06/18/2015   by EGD RX 08/18/1999    History of adenomatous polyp of colon 06/18/2015   HOH (hard of hearing)    Bilateral Hearing  Aids   Malleolar fracture (Right) 09/10/2016    Surgical History: Past Surgical History:  Procedure Laterality Date   ANGIOPLASTY     CATARACT EXTRACTION     COLONOSCOPY  2016   CORONARY ANGIOPLASTY     CYSTOSCOPY WITH STENT PLACEMENT Left 03/17/2019   Procedure: CYSTOSCOPY WITH STENT PLACEMENT;  Surgeon: Festus Aloe, MD;  Location: [facility];  Service: Urology;  Laterality: Left;   EYE SURGERY Bilateral    Cataract Extraction with IOL   GREEN LIGHT LASER TURP (TRANSURETHRAL RESECTION OF PROSTATE N/A 04/16/2015   Procedure: GREEN LIGHT LASER TURP (TRANSURETHRAL RESECTION OF PROSTATE WITH BLADDER BIOPSY;  Surgeon: Collier Flowers, MD;  Location: [facility];  Service: Urology;  Laterality: N/A;   heart stent placement     1998, 2000, 2012   PERIPHERAL VASCULAR THROMBECTOMY Left 03/22/2019   Procedure: PERIPHERAL  VASCULAR THROMBECTOMY and possible IVC filter;  Surgeon: Algernon Huxley, MD;  Location: [facility];  Service: Cardiovascular;  Laterality: Left;   TRANSURETHRAL RESECTION OF PROSTATE N/A 03/02/2017   Procedure: TRANSURETHRAL RESECTION OF THE PROSTATE (TURP);  Surgeon: Hollice Espy, MD;  Location: [facility];  Service: Urology;  Laterality: N/A;    Home Medications:  Allergies as of 05/16/2019      Reactions   Augmentin [amoxicillin-pot Clavulanate]    Tongue swelling 2 days after taking   Ciprofloxacin Diarrhea   Lisinopril Other (See Comments)   Unknown    Penicillins Swelling   unknwon reaction.  tolerates  amoxicillin Has patient had a PCN reaction causing immediate rash, facial/tongue/throat swelling, SOB or lightheadedness with hypotension: No Has patient had a PCN reaction causing severe rash involving mucus membranes or skin necrosis: No Has patient had a PCN reaction that required hospitalization: No Has patient had a PCN reaction occurring within the last 10 years: Yes If all of the above answers are "NO", then may proceed with Cephalosporin use.   Bactrim [sulfamethoxazole-trimethoprim] Rash   Sulfa Antibiotics Itching, Rash      Medication List       Accurate as of May 16, 2019 11:59 PM. If you have any questions, ask your nurse or doctor.        apixaban 5 MG Tabs tablet Commonly known as: ELIQUIS Take 1 tablet (5 mg total) by mouth 2 (two) times daily.   atorvastatin 10 MG tablet Commonly known as: Lipitor Take 1 tablet (10 mg total) by mouth daily.   ClearLax 17 GM/SCOOP powder Generic drug: polyethylene glycol powder Take 1 Container by mouth daily.   famotidine 20 MG tablet Commonly known as: Pepcid Take 1 tablet (20 mg total) by mouth daily.   fentaNYL 50 MCG/HR Commonly known as: [place] 1 patch onto the skin every 3 (three) days.   finasteride 5 MG tablet Commonly known as: Proscar Take 1 tablet (5 mg total) by mouth daily.   hydrochlorothiazide 25 MG tablet Commonly known as: HYDRODIURIL Take 0.5 tablets (12.5 mg total) by mouth daily.   isosorbide dinitrate 30 MG tablet Commonly known as: ISORDIL Take 30 mg by mouth daily.   Magnesium 250 MG Tabs Take 250 mg by mouth daily.   metoprolol succinate 25 MG 24 hr tablet Commonly known as: TOPROL-XL Take 1 tablet (25 mg total) by mouth daily.   mirabegron ER 25 MG Tb24 tablet Commonly known as: MYRBETRIQ Take 1 tablet (25 mg total) by mouth daily.   naloxegol oxalate 12.5 MG Tabs tablet Commonly known as: Movantik Take 1 tablet (12.5 mg total) by mouth daily.   nitrofurantoin  (macrocrystal-monohydrate) 100 MG capsule Commonly known as: Macrobid Take 1 capsule (100 mg total) by mouth 2 (two) times daily.   ondansetron 8 MG tablet Commonly known as: ZOFRAN Take 1 tablet (8 mg total) by mouth 2 (two) times daily as needed for nausea or vomiting.   Oxycodone HCl 10 MG Tabs Take 1-2 tablets (10-20 mg total) by mouth every 4 (four) hours as needed.   potassium chloride SA 20 MEQ tablet Commonly known as: K-DUR Take 1 tablet (20 mEq total) by mouth daily.   prochlorperazine 10 MG tablet Commonly known as: COMPAZINE Take 1 tablet (10 mg total) by mouth every 6 (six) hours as needed for nausea or vomiting.   psyllium 58.6 % packet Commonly known as: METAMUCIL Take 1 packet by mouth daily.   senna 8.6 MG  Tabs tablet Commonly known as: SENOKOT Take 1 tablet (8.6 mg total) by mouth daily as needed for mild constipation.   traZODone 50 MG tablet Commonly known as: DESYREL Take 0.5-1 tablets (25-50 mg total) by mouth at bedtime.   Vitamin D3 25 MCG (1000 UT) Caps Take 1,000 Units by mouth daily.       Allergies:  Allergies  Allergen Reactions   Augmentin [Amoxicillin-Pot Clavulanate]     Tongue swelling 2 days after taking   Ciprofloxacin Diarrhea   Lisinopril Other (See Comments)    Unknown    Penicillins Swelling    unknwon reaction.  tolerates amoxicillin Has patient had a PCN reaction causing immediate rash, facial/tongue/throat swelling, SOB or lightheadedness with hypotension: No Has patient had a PCN reaction causing severe rash involving mucus membranes or skin necrosis: No Has patient had a PCN reaction that required hospitalization: No Has patient had a PCN reaction occurring within the last 10 years: Yes If all of the above answers are "NO", then may proceed with Cephalosporin use.    Bactrim [Sulfamethoxazole-Trimethoprim] Rash   Sulfa Antibiotics Itching and Rash    Family History: Family History  Problem Relation Age of  Onset   Osteoporosis Mother    Alzheimer's disease Father    Kidney disease Neg Hx    Prostate cancer Neg Hx    Bladder Cancer Neg Hx    Kidney cancer Neg Hx     Social History:   reports that he quit smoking about 47 years ago. His smoking use included cigarettes. He has a 18.00 pack-year smoking history. He has never used smokeless tobacco. He reports that he does not drink alcohol or use drugs.  ROS: UROLOGY Frequent Urination?: Yes Hard to postpone urination?: No Burning/pain with urination?: Yes Get up at night to urinate?: Yes Leakage of urine?: No Urine stream starts and stops?: No Trouble starting stream?: No Do you have to strain to urinate?: No Blood in urine?: No Urinary tract infection?: No Sexually transmitted disease?: No Injury to kidneys or bladder?: No Painful intercourse?: No Weak stream?: No Erection problems?: No Penile pain?: No  Gastrointestinal Nausea?: No Vomiting?: No Indigestion/heartburn?: No Diarrhea?: No Constipation?: No  Constitutional Fever: No Night sweats?: No Weight loss?: No Fatigue?: No  Skin Skin rash/lesions?: No Itching?: No  Eyes Blurred vision?: No Double vision?: No  Ears/Nose/Throat Sore throat?: No Sinus problems?: No  Hematologic/Lymphatic Swollen glands?: No Easy bruising?: No  Cardiovascular Leg swelling?: No Chest pain?: No  Respiratory Cough?: No Shortness of breath?: No  Endocrine Excessive thirst?: No  Musculoskeletal Back pain?: Yes Joint pain?: No  Neurological Headaches?: No Dizziness?: No  Psychologic Depression?: No Anxiety?: No  Physical Exam: BP (!) 101/57 (BP Location: Left Arm, Patient Position: Sitting, Cuff Size: Normal)    Pulse (!) 118    Ht 6\' 1"  (1.854 m)    Wt 235 lb (106.6 kg)    BMI 31.00 kg/m   Constitutional:  Alert and oriented, no acute distress, nontoxic appearing HEENT: [place], AT Cardiovascular: No clubbing, cyanosis, or edema Respiratory: Normal  respiratory effort, no increased work of breathing Skin: No rashes, bruises or suspicious lesions Neurologic: In wheelchair with cane, moving BUEs Psychiatric: Normal mood and affect  Laboratory Data: Results for orders placed or performed in visit on 05/16/19  Microscopic Examination   URINE  Result Value Ref Range   WBC, UA >30 (A) 0 - 5 /hpf   RBC None seen 0 - 2 /hpf   Epithelial  Cells (non renal) 0-10 0 - 10 /hpf   Bacteria, UA None seen None seen/Few  Urinalysis, Complete  Result Value Ref Range   Specific Gravity, UA 1.020 1.005 - 1.030   pH, UA 6.5 5.0 - 7.5   Color, UA Yellow Yellow   Appearance Ur Cloudy (A) Clear   Leukocytes,UA 1+ (A) Negative   Protein,UA 2+ (A) Negative/Trace   Glucose, UA Negative Negative   Ketones, UA Negative Negative   RBC, UA 2+ (A) Negative   Bilirubin, UA Negative Negative   Urobilinogen, Ur 0.2 0.2 - 1.0 mg/dL   Nitrite, UA Negative Negative   Microscopic Examination See below:   Bladder Scan (Post Void Residual) in office  Result Value Ref Range   Scan Result 0    Assessment & Plan:   1. Urinary frequency UA improved over last report 8 days ago prior to his most recent course of Macrobid. PVR reassuring for urinary retention. I believe his symptom is caused by his cancer and associated bladder inflammation and not a true UTI. Will defer antibiotics at this time. Will defer culture.  Uribel contraindicated given concerns for serotonin syndrome when combined with trazodone and fentanyl. I did speak with his niece via telephone to advise her to increase his Myrbetriq from 25mg  to 50mg  daily.  - Bladder Scan (Post Void Residual) in office - Urinalysis, Complete  Debroah Loop, PA-C  [facility] [address] [place], [place] [address] [phone]

## 2019-05-17 ENCOUNTER — Other Ambulatory Visit: Payer: Self-pay

## 2019-05-17 ENCOUNTER — Ambulatory Visit: Payer: PPO | Admitting: Infectious Diseases

## 2019-05-17 ENCOUNTER — Ambulatory Visit
Admission: RE | Admit: 2019-05-17 | Discharge: 2019-05-17 | Disposition: A | Discharge status: Home / Self Care | Payer: PPO | Source: Ambulatory Visit | Attending: Radiation Oncology | Admitting: Radiation Oncology

## 2019-05-17 DIAGNOSIS — Z510 Encounter for antineoplastic radiation therapy: Secondary | ICD-10-CM | POA: Diagnosis not present

## 2019-05-18 ENCOUNTER — Ambulatory Visit
Admission: RE | Admit: 2019-05-18 | Discharge: 2019-05-18 | Disposition: A | Discharge status: Home / Self Care | Payer: PPO | Source: Ambulatory Visit | Attending: Radiation Oncology | Admitting: Radiation Oncology

## 2019-05-18 ENCOUNTER — Other Ambulatory Visit: Payer: Self-pay | Admitting: Hospice and Palliative Medicine

## 2019-05-18 ENCOUNTER — Telehealth (INDEPENDENT_AMBULATORY_CARE_PROVIDER_SITE_OTHER): Payer: Self-pay

## 2019-05-18 ENCOUNTER — Inpatient Hospital Stay (HOSPITAL_BASED_OUTPATIENT_CLINIC_OR_DEPARTMENT_OTHER): Payer: PPO | Admitting: Hospice and Palliative Medicine

## 2019-05-18 ENCOUNTER — Other Ambulatory Visit: Payer: Self-pay

## 2019-05-18 DIAGNOSIS — Z515 Encounter for palliative care: Secondary | ICD-10-CM

## 2019-05-18 DIAGNOSIS — C689 Malignant neoplasm of urinary organ, unspecified: Secondary | ICD-10-CM

## 2019-05-18 DIAGNOSIS — Z510 Encounter for antineoplastic radiation therapy: Secondary | ICD-10-CM | POA: Diagnosis not present

## 2019-05-18 MED ORDER — URIBEL 118 MG PO CAPS: 1.0000 | ORAL_CAPSULE | Freq: Four times a day (QID) | ORAL | 2 refills | Status: AC | PRN

## 2019-05-18 MED ORDER — FENTANYL 25 MCG/HR TD PT72: 1.0000 | MEDICATED_PATCH | TRANSDERMAL | 0 refills | Status: AC

## 2019-05-18 MED ORDER — FENTANYL 50 MCG/HR TD PT72: 1.0000 | MEDICATED_PATCH | TRANSDERMAL | 0 refills | Status: DC

## 2019-05-18 NOTE — Addendum Note (Signed)
Addended by: Irean Hong on: 05/18/2019 03:39 PM   Modules accepted: Orders

## 2019-05-18 NOTE — Progress Notes (Signed)
Refill fentanyl 22mcg Q72H (#5 patches). Spoke with niece. She says that pain is improving. Oxycodone is used 1-2 times day now. She also does not have to use as much acetaminophen.   Patient says he has not had much success with the Movantik.  It is been 2 days since his last bowel movement.  Will increase to 25 mg daily and plan to start adding back the laxatives if needed.  Niece feels that patient is perseverating on the need for daily bowel movement.  Patient is still having frequent urinary urgency at night.  He reports having to go the bathroom every 15 minutes but niece does not think it is nearly that frequent.  Patient says that he has not self catheterizing as was directed as he did not feel that it helped.  He was again encouraged to self catheterize prior to going to bed and as needed.  Will refer back to urology if needed.  Niece says she intends to cancel appointment the allergist as she does not feel that it will be helpful.

## 2019-05-18 NOTE — Telephone Encounter (Signed)
Alyson Locket from Physical therapy called to report missed visit due to the patient has appointment today and is in a lot of pain.

## 2019-05-18 NOTE — Progress Notes (Addendum)
[place]  Telephone:([phone] [phone]   Name: Bryan Nelson Date: 05/18/2019 MRN: [id number]  DOB: 11/29/1931  Patient Care Team: [name], MD as PCP - General (Family Medicine) Dingeldein, Remo Lipps, MD as Consulting Physician (Ophthalmology) Hollice Espy, MD as Consulting Physician (Urology) Yolonda Kida, MD as Consulting Physician (Cardiology) Laneta Simmers as Physician Assistant (Urology)    REASON FOR CONSULTATION: Palliative Care consult requested for this 85 y.o. male with multiple medical problems including history of diffuse large B-cell lymphoma (initially diagnosed August 2015) who is status post 6 cycles of R-CHOP chemotherapy.  Abdominal CT scan February 01, 2019 revealed a 5 cm soft tissue mass along the left pelvic sidewall obstructing the left ureter.  Biopsy revealed high-grade urothelial carcinoma with squamous differentiation.  Patient was referred to palliative care to help address goals.   SOCIAL HISTORY:     reports that he quit smoking about 47 years ago. His smoking use included cigarettes. He has a 18.00 pack-year smoking history. He has never used smokeless tobacco. He reports that he does not drink alcohol or use drugs.   Patient is a widower.  He has no children.  He lives at home alone.  Patient's nephew is involved in his care.  ADVANCE DIRECTIVES:  Patient reports that his nephew is his healthcare power of attorney  CODE STATUS: DNR (04/25/2019)  PAST MEDICAL HISTORY: Past Medical History:  Diagnosis Date   Arteriosclerosis of coronary artery 08/26/2011   Overview:      a.  1999 PCI of the mid LAD with stent.      b.  2002 Cath [place]: EF 56%. RCA-distal 25/25%. Left main-50% ostial.  Left circumflex-25% OM2.  LAD-25% proximal.  75% mid.  25/25% distal. 75% D1.      c.  07/2011 PCI of RCA with DES.[place]    Bleeding gastrointestinal 08/26/2011   Overview:      a.   Chronic abdominal pain, present improving.      b.  Duodenitis and gastritis by EGD in 2000.      c.  Pylori in 2000, treated.      d.  Gastroesophageal reflux disease.      e.  Diverticular disease.     BP (high blood pressure) 08/26/2011   Closed dislocation of right ankle 09/10/2016   Successfully reduced in ER 09/10/2016   Diffuse large B-cell lymphoma ([place]) 2015   chemo tx's   GERD (gastroesophageal reflux disease)    Heart disease    Helicobacter pylori infection 06/18/2015   by EGD RX 08/18/1999    History of adenomatous polyp of colon 06/18/2015   HOH (hard of hearing)    Bilateral Hearing  Aids   Malleolar fracture (Right) 09/10/2016    PAST SURGICAL HISTORY:  Past Surgical History:  Procedure Laterality Date   ANGIOPLASTY     CATARACT EXTRACTION     COLONOSCOPY  2016   CORONARY ANGIOPLASTY     CYSTOSCOPY WITH STENT PLACEMENT Left 03/17/2019   Procedure: CYSTOSCOPY WITH STENT PLACEMENT;  Surgeon: Festus Aloe, MD;  Location: [facility];  Service: Urology;  Laterality: Left;   EYE SURGERY Bilateral    Cataract Extraction with IOL   GREEN LIGHT LASER TURP (TRANSURETHRAL RESECTION OF PROSTATE N/A 04/16/2015   Procedure: GREEN LIGHT LASER TURP (TRANSURETHRAL RESECTION OF PROSTATE WITH BLADDER BIOPSY;  Surgeon: Collier Flowers, MD;  Location: [facility];  Service: Urology;  Laterality: N/A;  heart stent placement     1998, 2000, 2012   PERIPHERAL VASCULAR THROMBECTOMY Left 03/22/2019   Procedure: PERIPHERAL VASCULAR THROMBECTOMY and possible IVC filter;  Surgeon: Algernon Huxley, MD;  Location: [place] [facility];  Service: Cardiovascular;  Laterality: Left;   TRANSURETHRAL RESECTION OF PROSTATE N/A 03/02/2017   Procedure: TRANSURETHRAL RESECTION OF THE PROSTATE (TURP);  Surgeon: Hollice Espy, MD;  Location: [facility];  Service: Urology;  Laterality: N/A;    HEMATOLOGY/ONCOLOGY HISTORY:  Oncology History Overview Note   Diffuse Large B Cell Lymphoma: Patient was  initially diagnosed in August 2015, pathology was noted to be anaplastic subtype.  He completed 6 cycles of R-CHOP chemotherapy in January 2016. PET scan January 2017 revealed no evidence of disease.  2.  Urothelial carcinoma: Confirmed by biopsy.  Patient's most recent CT scan on March 01, 2019 reviewed independently and reported as above with progressive left pelvic mass greater than 4 cm.  Patient appears to have no other evidence of disease.  Case discussed with urology.  Given  the fact that patient is symptomatic with pain and has localized disease, he will benefit from concurrent chemotherapy using weekly cisplatin 40 mg/m along with daily XRT.  Delay cycle 1 of cisplatin today secondary to elevated creatinine.  Continue daily XRT.  Return to clinic in 1 week for further evaluation and reconsideration of cycle 2.     DLBCL (diffuse large B cell lymphoma) ([place])  04/10/2014 Initial Diagnosis   DLBCL (diffuse large B cell lymphoma) ([facility])     ALLERGIES:  is allergic to augmentin [amoxicillin-pot clavulanate]; ciprofloxacin; lisinopril; penicillins; bactrim [sulfamethoxazole-trimethoprim]; and sulfa antibiotics.  MEDICATIONS:  Current Outpatient Medications  Medication Sig Dispense Refill   apixaban (ELIQUIS) 5 MG TABS tablet Take 1 tablet (5 mg total) by mouth 2 (two) times daily. 60 tablet 3   atorvastatin (LIPITOR) 10 MG tablet Take 1 tablet (10 mg total) by mouth daily. 30 tablet 11   Cholecalciferol (VITAMIN D3) 1000 units CAPS Take 1,000 Units by mouth daily.     famotidine (PEPCID) 20 MG tablet Take 1 tablet (20 mg total) by mouth daily. 180 tablet 3   fentaNYL (DURAGESIC) 50 MCG/HR Place 1 patch onto the skin every 3 (three) days. 5 patch 0   finasteride (PROSCAR) 5 MG tablet Take 1 tablet (5 mg total) by mouth daily. 90 tablet 3   hydrochlorothiazide (HYDRODIURIL) 25 MG tablet Take 0.5 tablets (12.5 mg total) by mouth daily.     isosorbide dinitrate (ISORDIL) 30 MG tablet Take  30 mg by mouth daily.     Magnesium 250 MG TABS Take 250 mg by mouth daily.     metoprolol succinate (TOPROL-XL) 25 MG 24 hr tablet Take 1 tablet (25 mg total) by mouth daily. 90 tablet 4   mirabegron ER (MYRBETRIQ) 25 MG TB24 tablet Take 1 tablet (25 mg total) by mouth daily. 30 tablet 5   naloxegol oxalate (MOVANTIK) 12.5 MG TABS tablet Take 1 tablet (12.5 mg total) by mouth daily. 30 tablet 2   nitrofurantoin, macrocrystal-monohydrate, (MACROBID) 100 MG capsule Take 1 capsule (100 mg total) by mouth 2 (two) times daily. 20 capsule 0   ondansetron (ZOFRAN) 8 MG tablet Take 1 tablet (8 mg total) by mouth 2 (two) times daily as needed for nausea or vomiting. 20 tablet 3   Oxycodone HCl 10 MG TABS Take 1-2 tablets (10-20 mg total) by mouth every 4 (four) hours as needed. 90 tablet 0   polyethylene glycol powder (  CLEARLAX) 17 GM/SCOOP powder Take 1 Container by mouth daily.     potassium chloride SA (K-DUR) 20 MEQ tablet Take 1 tablet (20 mEq total) by mouth daily. 2 tablet 0   prochlorperazine (COMPAZINE) 10 MG tablet Take 1 tablet (10 mg total) by mouth every 6 (six) hours as needed for nausea or vomiting. 30 tablet 3   psyllium (METAMUCIL) 58.6 % packet Take 1 packet by mouth daily.     senna (SENOKOT) 8.6 MG TABS tablet Take 1 tablet (8.6 mg total) by mouth daily as needed for mild constipation. 120 tablet 0   traZODone (DESYREL) 50 MG tablet Take 0.5-1 tablets (25-50 mg total) by mouth at bedtime. 30 tablet 0   No current facility-administered medications for this visit.    Facility-Administered Medications Ordered in Other Visits  Medication Dose Route Frequency Provider Last Rate Last Dose   sodium chloride flush (NS) 0.9 % injection 10 mL  10 mL Intravenous Once Shaunte Tuft, Kirt Boys, NP        VITAL SIGNS: There were no vitals taken for this visit. There were no vitals filed for this visit.  Estimated body mass index is 31 kg/m as calculated from the following:   Height as  of 05/16/19: 6\' 1"  (1.854 m).   Weight as of 05/16/19: 235 lb (106.6 kg).  LABS: CBC:    Component Value Date/Time   WBC 6.2 05/15/2019 0921   HGB 9.1 (L) 05/15/2019 0921   HGB 13.1 08/10/2018 0821   HCT 28.3 (L) 05/15/2019 0921   HCT 38.9 08/10/2018 0821   PLT 139 (L) 05/15/2019 0921   PLT 240 08/10/2018 0821   MCV 87.9 05/15/2019 0921   MCV 88 08/10/2018 0821   MCV 91 09/16/2014 0928   NEUTROABS 5.0 05/15/2019 0921   NEUTROABS 2.4 05/07/2016 1055   NEUTROABS 2.3 09/16/2014 0928   LYMPHSABS 0.4 (L) 05/15/2019 0921   LYMPHSABS 0.8 05/07/2016 1055   LYMPHSABS 0.5 (L) 09/16/2014 0928   MONOABS 0.7 05/15/2019 0921   MONOABS 0.7 09/16/2014 0928   EOSABS 0.0 05/15/2019 0921   EOSABS 0.5 (H) 05/07/2016 1055   EOSABS 0.2 09/16/2014 0928   BASOSABS 0.0 05/15/2019 0921   BASOSABS 0.0 05/07/2016 1055   BASOSABS 0.1 09/16/2014 0928   Comprehensive Metabolic Panel:    Component Value Date/Time   NA 132 (L) 05/15/2019 0921   NA 140 11/09/2018 0908   NA 144 09/16/2014 0928   K 3.9 05/15/2019 0921   K 3.5 09/16/2014 0928   CL 93 (L) 05/15/2019 0921   CL 105 09/16/2014 0928   CO2 25 05/15/2019 0921   CO2 31 09/16/2014 0928   BUN 37 (H) 05/15/2019 0921   BUN 19 11/09/2018 0908   BUN 17 09/16/2014 0928   CREATININE 1.88 (H) 05/15/2019 0921   CREATININE 1.01 09/16/2014 0928   GLUCOSE 134 (H) 05/15/2019 0921   GLUCOSE 106 (H) 09/16/2014 0928   CALCIUM 8.7 (L) 05/15/2019 0921   CALCIUM 8.4 (L) 09/16/2014 0928   AST 21 05/07/2019 1257   AST 20 09/16/2014 0928   ALT 26 05/07/2019 1257   ALT 33 09/16/2014 0928   ALKPHOS 78 05/07/2019 1257   ALKPHOS 76 09/16/2014 0928   BILITOT 0.3 05/07/2019 1257   BILITOT 0.3 11/09/2018 0908   BILITOT 0.3 09/16/2014 0928   PROT 7.2 05/07/2019 1257   PROT 6.5 11/09/2018 0908   PROT 6.3 (L) 09/16/2014 0928   ALBUMIN 3.4 (L) 05/07/2019 1257   ALBUMIN 4.3 11/09/2018 0908  ALBUMIN 3.4 09/16/2014 0160    RADIOGRAPHIC STUDIES: Ct Abdomen  Pelvis W Contrast  Result Date: 05/02/2019 CLINICAL DATA:  Abdominal pain. Left pelvic urothelial carcinoma status post concurrent chemotherapy and radiation therapy. Restaging. Additional history of diffuse large B-cell lymphoma diagnosed in 2015 status post chemotherapy. EXAM: CT ABDOMEN AND PELVIS WITH CONTRAST TECHNIQUE: Multidetector CT imaging of the abdomen and pelvis was performed using the standard protocol following bolus administration of intravenous contrast. CONTRAST:  58mL OMNIPAQUE IOHEXOL 300 MG/ML  SOLN COMPARISON:  03/01/2019 CT abdomen/pelvis. FINDINGS: Lower chest: No significant pulmonary nodules or acute consolidative airspace disease. Coronary atherosclerosis. Hepatobiliary: Normal liver size. No liver mass. Normal gallbladder with no radiopaque cholelithiasis. No biliary ductal dilatation. Pancreas: Normal, with no mass or duct dilation. Spleen: Normal size. No mass. Adrenals/Urinary Tract: Normal adrenals. Left nephroureteral stent is well positioned with the proximal pigtail portion in the upper left renal collecting system and the distal pigtail portion in posterior bladder lumen. No hydronephrosis. Simple bilateral renal cysts, largest 4.9 cm in the posterior upper right kidney. There is an infiltrative heterogeneously enhancing 5.0 x 4.5 cm upper left pelvic mass encasing the upper left pelvic ureter and partially encasing the proximal left external iliac vein with associated new left common and external iliac vein stent (series 2/image 67), previously 5.2 x 4.5 cm using similar measurement technique, not substantially changed. This mass is also intimately associated with the superior margin of a left posterior bladder diverticulum. Relatively collapsed bladder. No bladder stones. Stomach/Bowel: Normal non-distended stomach. Normal caliber small bowel with no small bowel wall thickening. Normal appendix. Oral contrast transits to the colon. Moderate left colonic diverticulosis, with no  large bowel wall thickening or acute pericolonic fat stranding. Vascular/Lymphatic: Atherosclerotic abdominal aorta with 3.1 cm infrarenal abdominal aortic aneurysm, stable. Patent portal, splenic, hepatic and renal veins. No pathologically enlarged lymph nodes in the abdomen or pelvis. Reproductive: Top-normal size prostate. Other: No pneumoperitoneum, ascites or focal fluid collection. Musculoskeletal: There is a mildly expansile destructive lytic osseous lesion in the posterior right T12 elements (series 2/image 19), essentially new, measuring approximately 3.7 x 2.2 x 4.0 cm. Moderate thoracolumbar spondylosis. Chronic mild-to-moderate T12 vertebral compression fracture. IMPRESSION: 1. Destructive lytic osseous metastasis in the right T12 posterior elements, essentially new. 2. Locally infiltrative left pelvic tumor encasing the upper left pelvic ureter has not substantially changed in size. 3. Well-positioned left nephroureteral stent with no residual left hydronephrosis. 4. Infrarenal 3.1 cm Abdominal Aortic Aneurysm (ICD10-I71.9). Recommend follow-up aortic ultrasound in 3 years. This recommendation follows ACR consensus guidelines: White Paper of the ACR Incidental Findings Committee II on Vascular Findings. J Am Coll Radiol 2013; 10:932-355. 5.  Aortic Atherosclerosis (ICD10-I70.0). Electronically Signed   By: Ilona Sorrel M.D.   On: 05/02/2019 12:36   Dg Abd 2 Views  Result Date: 05/05/2019 CLINICAL DATA:  Constipated EXAM: ABDOMEN - 2 VIEW COMPARISON:  CT 05/02/2019 FINDINGS: Lung bases are clear. Left ureteral stent remains in place. Nonobstructed bowel gas pattern with large amount of stool throughout the colon. Stool mixed with residual enteral contrast within the colon and rectum. Left iliac stent IMPRESSION: Nonobstructed gas pattern with large amount of stool throughout the colon. Stool mixed with residual enteral contrast in the distal colon and rectum Electronically Signed   By: Donavan Foil  M.D.   On: 05/05/2019 20:49    PERFORMANCE STATUS (ECOG) : 1 - Symptomatic but completely ambulatory  Review of Systems Unless otherwise noted, a complete review of systems is negative.  Physical Exam General: NAD, frail appearing, thin Pulmonary: Unlabored Extremities: no edema Skin: no rashes Neurological: Weakness but otherwise nonfocal  IMPRESSION: Patient with an add-on to the schedule today at patient's request.  He was seen in radiation oncology after he had his treatment.  Patient describes persistent urinary frequency/urgency.  He reports having to use the bathroom frequently at night.  Symptoms are impacting his sleep and quality of life.  Patient saw urology on 9/23.  Myrbetriq was increased to 50 mg daily.  Patient says he his continue to try to self cath but feels that he is unable to insert the catheter fully into the bladder and is having very little urinary return.  We will plan to reach out to urology to see if anything else can be offered.  Patient says pain is improved.  I note that Uribel was considered but not prescribed due to concerns for serotonin syndrome with fentanyl and trazodone.  Could slowly start weaning the fentanyl as his urinary symptoms seem to be more significant at this point.  Patient says he had a bowel movement today.  I had increased the dose of Movantik to 25 mg daily.  I spoke with niece by phone.  Case and plan discussed with Dr. Grayland Ormond.  Addendum: Case discussed with Debroah Loop, PA-C with urology.  Post voiding bladder scan was done in the office and patient does not seem to be retaining urine.  It is felt that his urinary urgency and frequency are all secondary to the cancer.  Lynnda Shields is being recommended by urology. However, this was not prescribed due to concerns with serotonin syndrome as patient is concurrently on fentanyl and trazodone.  I called and spoke with patient and niece.  Patient would like to proceed with Uribel  even with risk of serotonin syndrome.  We did discuss the symptoms associated with serotonin syndrome including confusion, agitation, nausea vomiting diarrhea, tachycardia, tremors, etc. and patient will seek care if any changes are noted.    Will decrease dose of fentanyl to 25 mcg and also decrease dose of trazodone to 25 mg only as needed.  I hope that weaning fentanyl will also improve his constipation.  If pain is stable will continue to wean fentanyl and discontinue entirely.  PLAN: -Continue current scope of treatment -oxycodone 10mg  Q4H prn for BTP -Decrease fentanyl 12 mcg every 72 hours (#5 patches) -Uribel 118mg  QID PRN (#120) -Movantik 25 mg daily.  -RTC in 1 week   Patient expressed understanding and was in agreement with this plan. He also understands that He can call the clinic at any time with any questions, concerns, or complaints.     Time Total: 15 minutes  Visit consisted of counseling and education dealing with the complex and emotionally intense issues of symptom management and palliative care in the setting of serious and potentially life-threatening illness.Greater than 50%  of this time was spent counseling and coordinating care related to the above assessment and plan.  Signed by: Altha Harm, PhD, NP-C [phone] (Work Cell)

## 2019-05-21 ENCOUNTER — Encounter: Payer: Self-pay | Admitting: Hospice and Palliative Medicine

## 2019-05-21 ENCOUNTER — Ambulatory Visit
Admission: RE | Admit: 2019-05-21 | Discharge: 2019-05-21 | Disposition: A | Discharge status: Home / Self Care | Payer: PPO | Source: Ambulatory Visit | Attending: Radiation Oncology | Admitting: Radiation Oncology

## 2019-05-21 ENCOUNTER — Other Ambulatory Visit: Payer: Self-pay

## 2019-05-21 ENCOUNTER — Inpatient Hospital Stay (HOSPITAL_BASED_OUTPATIENT_CLINIC_OR_DEPARTMENT_OTHER): Payer: PPO | Admitting: Hospice and Palliative Medicine

## 2019-05-21 VITALS — BP 117/65 | HR 97 | Temp 97.5°F | Resp 18 | Wt 215.4 lb

## 2019-05-21 DIAGNOSIS — Z510 Encounter for antineoplastic radiation therapy: Secondary | ICD-10-CM | POA: Diagnosis not present

## 2019-05-21 DIAGNOSIS — Z515 Encounter for palliative care: Secondary | ICD-10-CM | POA: Diagnosis not present

## 2019-05-21 DIAGNOSIS — C689 Malignant neoplasm of urinary organ, unspecified: Secondary | ICD-10-CM | POA: Diagnosis not present

## 2019-05-21 DIAGNOSIS — C7951 Secondary malignant neoplasm of bone: Secondary | ICD-10-CM | POA: Diagnosis not present

## 2019-05-21 NOTE — Progress Notes (Signed)
[place]  Telephone:([phone] [phone]   Name: HAMZEH TALL Date: 05/21/2019 MRN: [id number]  DOB: Nov 09, 1931  Patient Care Team: [name], MD as PCP - General (Family Medicine) Dingeldein, Remo Lipps, MD as Consulting Physician (Ophthalmology) Hollice Espy, MD as Consulting Physician (Urology) Yolonda Kida, MD as Consulting Physician (Cardiology) Laneta Simmers as Physician Assistant (Urology)    REASON FOR CONSULTATION: Palliative Care consult requested for this 85 y.o. male with multiple medical problems including history of diffuse large B-cell lymphoma (initially diagnosed August 2015) who is status post 6 cycles of R-CHOP chemotherapy.  Abdominal CT scan February 01, 2019 revealed a 5 cm soft tissue mass along the left pelvic sidewall obstructing the left ureter.  Biopsy revealed high-grade urothelial carcinoma with squamous differentiation.  Patient was referred to palliative care to help address goals.   SOCIAL HISTORY:     reports that he quit smoking about 47 years ago. His smoking use included cigarettes. He has a 18.00 pack-year smoking history. He has never used smokeless tobacco. He reports that he does not drink alcohol or use drugs.   Patient is a widower.  He has no children.  He lives at home alone.  Patient's nephew is involved in his care.  ADVANCE DIRECTIVES:  Patient reports that his nephew is his healthcare power of attorney  CODE STATUS: DNR (04/25/2019)  PAST MEDICAL HISTORY: Past Medical History:  Diagnosis Date   Arteriosclerosis of coronary artery 08/26/2011   Overview:      a.  1999 PCI of the mid LAD with stent.      b.  2002 Cath [facility]: EF 56%. RCA-distal 25/25%. Left main-50% ostial.  Left circumflex-25% OM2.  LAD-25% proximal.  75% mid.  25/25% distal. 75% D1.      c.  07/2011 PCI of RCA with DES.[place]    Bleeding gastrointestinal 08/26/2011   Overview:      a.   Chronic abdominal pain, present improving.      b.  Duodenitis and gastritis by EGD in 2000.      c.  Pylori in 2000, treated.      d.  Gastroesophageal reflux disease.      e.  Diverticular disease.     BP (high blood pressure) 08/26/2011   Closed dislocation of right ankle 09/10/2016   Successfully reduced in ER 09/10/2016   Diffuse large B-cell lymphoma ([place]) 2015   chemo tx's   GERD (gastroesophageal reflux disease)    Heart disease    Helicobacter pylori infection 06/18/2015   by EGD RX 08/18/1999    History of adenomatous polyp of colon 06/18/2015   HOH (hard of hearing)    Bilateral Hearing  Aids   Malleolar fracture (Right) 09/10/2016    PAST SURGICAL HISTORY:  Past Surgical History:  Procedure Laterality Date   ANGIOPLASTY     CATARACT EXTRACTION     COLONOSCOPY  2016   CORONARY ANGIOPLASTY     CYSTOSCOPY WITH STENT PLACEMENT Left 03/17/2019   Procedure: CYSTOSCOPY WITH STENT PLACEMENT;  Surgeon: Festus Aloe, MD;  Location: [facility];  Service: Urology;  Laterality: Left;   EYE SURGERY Bilateral    Cataract Extraction with IOL   GREEN LIGHT LASER TURP (TRANSURETHRAL RESECTION OF PROSTATE N/A 04/16/2015   Procedure: GREEN LIGHT LASER TURP (TRANSURETHRAL RESECTION OF PROSTATE WITH BLADDER BIOPSY;  Surgeon: Collier Flowers, MD;  Location: [facility];  Service: Urology;  Laterality: N/A;  heart stent placement     1998, 2000, 2012   PERIPHERAL VASCULAR THROMBECTOMY Left 03/22/2019   Procedure: PERIPHERAL VASCULAR THROMBECTOMY and possible IVC filter;  Surgeon: Algernon Huxley, MD;  Location: [facility];  Service: Cardiovascular;  Laterality: Left;   TRANSURETHRAL RESECTION OF PROSTATE N/A 03/02/2017   Procedure: TRANSURETHRAL RESECTION OF THE PROSTATE (TURP);  Surgeon: Hollice Espy, MD;  Location: [facility];  Service: Urology;  Laterality: N/A;    HEMATOLOGY/ONCOLOGY HISTORY:  Oncology History Overview Note   Diffuse Large B Cell Lymphoma: Patient was  initially diagnosed in August 2015, pathology was noted to be anaplastic subtype.  He completed 6 cycles of R-CHOP chemotherapy in January 2016. PET scan January 2017 revealed no evidence of disease.  2.  Urothelial carcinoma: Confirmed by biopsy.  Patient's most recent CT scan on March 01, 2019 reviewed independently and reported as above with progressive left pelvic mass greater than 4 cm.  Patient appears to have no other evidence of disease.  Case discussed with urology.  Given  the fact that patient is symptomatic with pain and has localized disease, he will benefit from concurrent chemotherapy using weekly cisplatin 40 mg/m along with daily XRT.  Delay cycle 1 of cisplatin today secondary to elevated creatinine.  Continue daily XRT.  Return to clinic in 1 week for further evaluation and reconsideration of cycle 2.     DLBCL (diffuse large B cell lymphoma) ([place])  04/10/2014 Initial Diagnosis   DLBCL (diffuse large B cell lymphoma) ([facility])     ALLERGIES:  is allergic to augmentin [amoxicillin-pot clavulanate]; ciprofloxacin; lisinopril; penicillins; bactrim [sulfamethoxazole-trimethoprim]; and sulfa antibiotics.  MEDICATIONS:  Current Outpatient Medications  Medication Sig Dispense Refill   apixaban (ELIQUIS) 5 MG TABS tablet Take 1 tablet (5 mg total) by mouth 2 (two) times daily. 60 tablet 3   atorvastatin (LIPITOR) 10 MG tablet Take 1 tablet (10 mg total) by mouth daily. 30 tablet 11   Cholecalciferol (VITAMIN D3) 1000 units CAPS Take 1,000 Units by mouth daily.     famotidine (PEPCID) 20 MG tablet Take 1 tablet (20 mg total) by mouth daily. 180 tablet 3   fentaNYL (DURAGESIC) 25 MCG/HR Place 1 patch onto the skin every 3 (three) days. 5 patch 0   finasteride (PROSCAR) 5 MG tablet Take 1 tablet (5 mg total) by mouth daily. 90 tablet 3   hydrochlorothiazide (HYDRODIURIL) 25 MG tablet Take 0.5 tablets (12.5 mg total) by mouth daily.     isosorbide dinitrate (ISORDIL) 30 MG tablet Take  30 mg by mouth daily.     Magnesium 250 MG TABS Take 250 mg by mouth daily.     Meth-Hyo-M Bl-Na Phos-Ph Sal (URIBEL) 118 MG CAPS Take 1 capsule (118 mg total) by mouth every 6 (six) hours as needed. 120 capsule 2   metoprolol succinate (TOPROL-XL) 25 MG 24 hr tablet Take 1 tablet (25 mg total) by mouth daily. 90 tablet 4   mirabegron ER (MYRBETRIQ) 25 MG TB24 tablet Take 1 tablet (25 mg total) by mouth daily. 30 tablet 5   naloxegol oxalate (MOVANTIK) 12.5 MG TABS tablet Take 1 tablet (12.5 mg total) by mouth daily. 30 tablet 2   nitrofurantoin, macrocrystal-monohydrate, (MACROBID) 100 MG capsule Take 1 capsule (100 mg total) by mouth 2 (two) times daily. 20 capsule 0   ondansetron (ZOFRAN) 8 MG tablet Take 1 tablet (8 mg total) by mouth 2 (two) times daily as needed for nausea or vomiting. 20 tablet 3  Oxycodone HCl 10 MG TABS Take 1-2 tablets (10-20 mg total) by mouth every 4 (four) hours as needed. 90 tablet 0   polyethylene glycol powder (CLEARLAX) 17 GM/SCOOP powder Take 1 Container by mouth daily.     potassium chloride SA (K-DUR) 20 MEQ tablet Take 1 tablet (20 mEq total) by mouth daily. 2 tablet 0   prochlorperazine (COMPAZINE) 10 MG tablet Take 1 tablet (10 mg total) by mouth every 6 (six) hours as needed for nausea or vomiting. 30 tablet 3   psyllium (METAMUCIL) 58.6 % packet Take 1 packet by mouth daily.     senna (SENOKOT) 8.6 MG TABS tablet Take 1 tablet (8.6 mg total) by mouth daily as needed for mild constipation. 120 tablet 0   traZODone (DESYREL) 50 MG tablet Take 0.5-1 tablets (25-50 mg total) by mouth at bedtime. 30 tablet 0   No current facility-administered medications for this visit.    Facility-Administered Medications Ordered in Other Visits  Medication Dose Route Frequency Provider Last Rate Last Dose   sodium chloride flush (NS) 0.9 % injection 10 mL  10 mL Intravenous Once Azaylia Fong, Kirt Boys, NP        VITAL SIGNS: BP 117/65 (BP Location: Right Arm,  Patient Position: Sitting, Cuff Size: Normal)    Pulse 97    Temp (!) 97.5 F (36.4 C) (Tympanic)    Resp 18    Wt 215 lb 6.4 oz (97.7 kg)    SpO2 98%    BMI 28.42 kg/m  Filed Weights   05/21/19 1359  Weight: 215 lb 6.4 oz (97.7 kg)    Estimated body mass index is 28.42 kg/m as calculated from the following:   Height as of 05/16/19: 6\' 1"  (1.854 m).   Weight as of this encounter: 215 lb 6.4 oz (97.7 kg).  LABS: CBC:    Component Value Date/Time   WBC 6.2 05/15/2019 0921   HGB 9.1 (L) 05/15/2019 0921   HGB 13.1 08/10/2018 0821   HCT 28.3 (L) 05/15/2019 0921   HCT 38.9 08/10/2018 0821   PLT 139 (L) 05/15/2019 0921   PLT 240 08/10/2018 0821   MCV 87.9 05/15/2019 0921   MCV 88 08/10/2018 0821   MCV 91 09/16/2014 0928   NEUTROABS 5.0 05/15/2019 0921   NEUTROABS 2.4 05/07/2016 1055   NEUTROABS 2.3 09/16/2014 0928   LYMPHSABS 0.4 (L) 05/15/2019 0921   LYMPHSABS 0.8 05/07/2016 1055   LYMPHSABS 0.5 (L) 09/16/2014 0928   MONOABS 0.7 05/15/2019 0921   MONOABS 0.7 09/16/2014 0928   EOSABS 0.0 05/15/2019 0921   EOSABS 0.5 (H) 05/07/2016 1055   EOSABS 0.2 09/16/2014 0928   BASOSABS 0.0 05/15/2019 0921   BASOSABS 0.0 05/07/2016 1055   BASOSABS 0.1 09/16/2014 0928   Comprehensive Metabolic Panel:    Component Value Date/Time   NA 132 (L) 05/15/2019 0921   NA 140 11/09/2018 0908   NA 144 09/16/2014 0928   K 3.9 05/15/2019 0921   K 3.5 09/16/2014 0928   CL 93 (L) 05/15/2019 0921   CL 105 09/16/2014 0928   CO2 25 05/15/2019 0921   CO2 31 09/16/2014 0928   BUN 37 (H) 05/15/2019 0921   BUN 19 11/09/2018 0908   BUN 17 09/16/2014 0928   CREATININE 1.88 (H) 05/15/2019 0921   CREATININE 1.01 09/16/2014 0928   GLUCOSE 134 (H) 05/15/2019 0921   GLUCOSE 106 (H) 09/16/2014 0928   CALCIUM 8.7 (L) 05/15/2019 0921   CALCIUM 8.4 (L) 09/16/2014 0093  AST 21 05/07/2019 1257   AST 20 09/16/2014 0928   ALT 26 05/07/2019 1257   ALT 33 09/16/2014 0928   ALKPHOS 78 05/07/2019 1257    ALKPHOS 76 09/16/2014 0928   BILITOT 0.3 05/07/2019 1257   BILITOT 0.3 11/09/2018 0908   BILITOT 0.3 09/16/2014 0928   PROT 7.2 05/07/2019 1257   PROT 6.5 11/09/2018 0908   PROT 6.3 (L) 09/16/2014 0928   ALBUMIN 3.4 (L) 05/07/2019 1257   ALBUMIN 4.3 11/09/2018 0908   ALBUMIN 3.4 09/16/2014 0928    RADIOGRAPHIC STUDIES: Ct Abdomen Pelvis W Contrast  Result Date: 05/02/2019 CLINICAL DATA:  Abdominal pain. Left pelvic urothelial carcinoma status post concurrent chemotherapy and radiation therapy. Restaging. Additional history of diffuse large B-cell lymphoma diagnosed in 2015 status post chemotherapy. EXAM: CT ABDOMEN AND PELVIS WITH CONTRAST TECHNIQUE: Multidetector CT imaging of the abdomen and pelvis was performed using the standard protocol following bolus administration of intravenous contrast. CONTRAST:  42mL OMNIPAQUE IOHEXOL 300 MG/ML  SOLN COMPARISON:  03/01/2019 CT abdomen/pelvis. FINDINGS: Lower chest: No significant pulmonary nodules or acute consolidative airspace disease. Coronary atherosclerosis. Hepatobiliary: Normal liver size. No liver mass. Normal gallbladder with no radiopaque cholelithiasis. No biliary ductal dilatation. Pancreas: Normal, with no mass or duct dilation. Spleen: Normal size. No mass. Adrenals/Urinary Tract: Normal adrenals. Left nephroureteral stent is well positioned with the proximal pigtail portion in the upper left renal collecting system and the distal pigtail portion in posterior bladder lumen. No hydronephrosis. Simple bilateral renal cysts, largest 4.9 cm in the posterior upper right kidney. There is an infiltrative heterogeneously enhancing 5.0 x 4.5 cm upper left pelvic mass encasing the upper left pelvic ureter and partially encasing the proximal left external iliac vein with associated new left common and external iliac vein stent (series 2/image 67), previously 5.2 x 4.5 cm using similar measurement technique, not substantially changed. This mass is also  intimately associated with the superior margin of a left posterior bladder diverticulum. Relatively collapsed bladder. No bladder stones. Stomach/Bowel: Normal non-distended stomach. Normal caliber small bowel with no small bowel wall thickening. Normal appendix. Oral contrast transits to the colon. Moderate left colonic diverticulosis, with no large bowel wall thickening or acute pericolonic fat stranding. Vascular/Lymphatic: Atherosclerotic abdominal aorta with 3.1 cm infrarenal abdominal aortic aneurysm, stable. Patent portal, splenic, hepatic and renal veins. No pathologically enlarged lymph nodes in the abdomen or pelvis. Reproductive: Top-normal size prostate. Other: No pneumoperitoneum, ascites or focal fluid collection. Musculoskeletal: There is a mildly expansile destructive lytic osseous lesion in the posterior right T12 elements (series 2/image 19), essentially new, measuring approximately 3.7 x 2.2 x 4.0 cm. Moderate thoracolumbar spondylosis. Chronic mild-to-moderate T12 vertebral compression fracture. IMPRESSION: 1. Destructive lytic osseous metastasis in the right T12 posterior elements, essentially new. 2. Locally infiltrative left pelvic tumor encasing the upper left pelvic ureter has not substantially changed in size. 3. Well-positioned left nephroureteral stent with no residual left hydronephrosis. 4. Infrarenal 3.1 cm Abdominal Aortic Aneurysm (ICD10-I71.9). Recommend follow-up aortic ultrasound in 3 years. This recommendation follows ACR consensus guidelines: White Paper of the ACR Incidental Findings Committee II on Vascular Findings. J Am Coll Radiol 2013; 07:371-062. 5.  Aortic Atherosclerosis (ICD10-I70.0). Electronically Signed   By: Ilona Sorrel M.D.   On: 05/02/2019 12:36   Dg Abd 2 Views  Result Date: 05/05/2019 CLINICAL DATA:  Constipated EXAM: ABDOMEN - 2 VIEW COMPARISON:  CT 05/02/2019 FINDINGS: Lung bases are clear. Left ureteral stent remains in place. Nonobstructed bowel gas  pattern with  large amount of stool throughout the colon. Stool mixed with residual enteral contrast within the colon and rectum. Left iliac stent IMPRESSION: Nonobstructed gas pattern with large amount of stool throughout the colon. Stool mixed with residual enteral contrast in the distal colon and rectum Electronically Signed   By: Donavan Foil M.D.   On: 05/05/2019 20:49    PERFORMANCE STATUS (ECOG) : 1 - Symptomatic but completely ambulatory  Review of Systems Unless otherwise noted, a complete review of systems is negative.  Physical Exam General: NAD, frail appearing, thin Pulmonary: Unlabored Extremities: no edema Skin: no rashes Neurological: Weakness but otherwise nonfocal  IMPRESSION: Routine follow-up visit made today.  Patient reports that 5 minutes after taking the Uribel Friday night he developed uncontrollable shivering that lasted several hours.  He denies fevers.  Patient took the Uribel Saturday night and did not have similar effect.  However, patient's description of symptoms is consistent with rigors.  Concern is for serotonergic effects from medications.  Although timing does not fully support Uribel as a contributing factor, will hold for now.  We will also hold trazodone and continue to wean fentanyl.  Patient does have transient lower abdominal pain.  He says that constipation is improved on Movantik.  He denies diarrhea, abdominal distention, bloating, or gassy pain.  Patient is nontender to palp on exam.  Recommended as needed pain meds and would consider imaging for persistent or worsening pain.  Overall, patient reports that his back pain is markedly improved.  We will continue to wean fentanyl as tolerated.  He is not using very much oxycodone.  Patient continues to ask about his prognosis.  I feel that he is depressed.  Would benefit from an SSRI as a first-line antidepressant but I am concerned about serotonin effects.  Would consider SNRI but I am reluctant to  add another medication at present.  Spoke with niece by phone.   PLAN: -Continue current scope of treatment -oxycodone 10mg  Q4H prn for BTP -Decrease fentanyl 25 mcg every 72 hours.  Wean as tolerated -Hold Uribel and trazodone -Continue Movantik 25 mg daily.  -RTC in 2 weeks   Patient expressed understanding and was in agreement with this plan. He also understands that He can call the clinic at any time with any questions, concerns, or complaints.     Time Total: 15 minutes  Visit consisted of counseling and education dealing with the complex and emotionally intense issues of symptom management and palliative care in the setting of serious and potentially life-threatening illness.Greater than 50%  of this time was spent counseling and coordinating care related to the above assessment and plan.  Signed by: Altha Harm, PhD, NP-C [phone] (Work Cell)

## 2019-05-21 NOTE — Progress Notes (Signed)
Patient is here today for follow up, he mentions he has pain in his abdomen. He feels hopeless and mentions he is tired of feeling this way.

## 2019-05-22 ENCOUNTER — Telehealth: Payer: Self-pay | Admitting: Hospice and Palliative Medicine

## 2019-05-22 DIAGNOSIS — N401 Enlarged prostate with lower urinary tract symptoms: Secondary | ICD-10-CM | POA: Diagnosis not present

## 2019-05-22 DIAGNOSIS — I2510 Atherosclerotic heart disease of native coronary artery without angina pectoris: Secondary | ICD-10-CM | POA: Diagnosis not present

## 2019-05-22 DIAGNOSIS — N136 Pyonephrosis: Secondary | ICD-10-CM | POA: Diagnosis not present

## 2019-05-22 DIAGNOSIS — E785 Hyperlipidemia, unspecified: Secondary | ICD-10-CM | POA: Diagnosis not present

## 2019-05-22 DIAGNOSIS — R3914 Feeling of incomplete bladder emptying: Secondary | ICD-10-CM | POA: Diagnosis not present

## 2019-05-22 DIAGNOSIS — Z87891 Personal history of nicotine dependence: Secondary | ICD-10-CM | POA: Diagnosis not present

## 2019-05-22 DIAGNOSIS — Z9181 History of falling: Secondary | ICD-10-CM | POA: Diagnosis not present

## 2019-05-22 DIAGNOSIS — G4700 Insomnia, unspecified: Secondary | ICD-10-CM | POA: Diagnosis not present

## 2019-05-22 DIAGNOSIS — C7989 Secondary malignant neoplasm of other specified sites: Secondary | ICD-10-CM | POA: Diagnosis not present

## 2019-05-22 DIAGNOSIS — T783XXD Angioneurotic edema, subsequent encounter: Secondary | ICD-10-CM | POA: Diagnosis not present

## 2019-05-22 DIAGNOSIS — Z792 Long term (current) use of antibiotics: Secondary | ICD-10-CM | POA: Diagnosis not present

## 2019-05-22 DIAGNOSIS — B9620 Unspecified Escherichia coli [E. coli] as the cause of diseases classified elsewhere: Secondary | ICD-10-CM | POA: Diagnosis not present

## 2019-05-22 DIAGNOSIS — K219 Gastro-esophageal reflux disease without esophagitis: Secondary | ICD-10-CM | POA: Diagnosis not present

## 2019-05-22 DIAGNOSIS — H9193 Unspecified hearing loss, bilateral: Secondary | ICD-10-CM | POA: Diagnosis not present

## 2019-05-22 DIAGNOSIS — E559 Vitamin D deficiency, unspecified: Secondary | ICD-10-CM | POA: Diagnosis not present

## 2019-05-22 DIAGNOSIS — Z48812 Encounter for surgical aftercare following surgery on the circulatory system: Secondary | ICD-10-CM | POA: Diagnosis not present

## 2019-05-22 DIAGNOSIS — Z48816 Encounter for surgical aftercare following surgery on the genitourinary system: Secondary | ICD-10-CM | POA: Diagnosis not present

## 2019-05-22 DIAGNOSIS — C679 Malignant neoplasm of bladder, unspecified: Secondary | ICD-10-CM | POA: Diagnosis not present

## 2019-05-22 DIAGNOSIS — C8330 Diffuse large B-cell lymphoma, unspecified site: Secondary | ICD-10-CM | POA: Diagnosis not present

## 2019-05-22 DIAGNOSIS — R338 Other retention of urine: Secondary | ICD-10-CM | POA: Diagnosis not present

## 2019-05-22 DIAGNOSIS — I10 Essential (primary) hypertension: Secondary | ICD-10-CM | POA: Diagnosis not present

## 2019-05-22 NOTE — Telephone Encounter (Signed)
I received a call from patient's niece. She says that patient is reporting severe constipation. However, she says that family found that patient had a very large BM this morning. They feel he is fixated on his bowel/bladder. We discussed his anxiety/depression and management.

## 2019-05-24 ENCOUNTER — Other Ambulatory Visit: Payer: Self-pay | Admitting: Hospice and Palliative Medicine

## 2019-05-24 DIAGNOSIS — N401 Enlarged prostate with lower urinary tract symptoms: Secondary | ICD-10-CM | POA: Diagnosis not present

## 2019-05-24 DIAGNOSIS — Z9181 History of falling: Secondary | ICD-10-CM | POA: Diagnosis not present

## 2019-05-24 DIAGNOSIS — R3914 Feeling of incomplete bladder emptying: Secondary | ICD-10-CM | POA: Diagnosis not present

## 2019-05-24 DIAGNOSIS — K219 Gastro-esophageal reflux disease without esophagitis: Secondary | ICD-10-CM | POA: Diagnosis not present

## 2019-05-24 DIAGNOSIS — Z792 Long term (current) use of antibiotics: Secondary | ICD-10-CM | POA: Diagnosis not present

## 2019-05-24 DIAGNOSIS — T783XXD Angioneurotic edema, subsequent encounter: Secondary | ICD-10-CM | POA: Diagnosis not present

## 2019-05-24 DIAGNOSIS — C679 Malignant neoplasm of bladder, unspecified: Secondary | ICD-10-CM | POA: Diagnosis not present

## 2019-05-24 DIAGNOSIS — C8330 Diffuse large B-cell lymphoma, unspecified site: Secondary | ICD-10-CM | POA: Diagnosis not present

## 2019-05-24 DIAGNOSIS — C7989 Secondary malignant neoplasm of other specified sites: Secondary | ICD-10-CM | POA: Diagnosis not present

## 2019-05-24 DIAGNOSIS — Z48812 Encounter for surgical aftercare following surgery on the circulatory system: Secondary | ICD-10-CM | POA: Diagnosis not present

## 2019-05-24 DIAGNOSIS — Z87891 Personal history of nicotine dependence: Secondary | ICD-10-CM | POA: Diagnosis not present

## 2019-05-24 DIAGNOSIS — B9620 Unspecified Escherichia coli [E. coli] as the cause of diseases classified elsewhere: Secondary | ICD-10-CM | POA: Diagnosis not present

## 2019-05-24 DIAGNOSIS — I10 Essential (primary) hypertension: Secondary | ICD-10-CM | POA: Diagnosis not present

## 2019-05-24 DIAGNOSIS — E785 Hyperlipidemia, unspecified: Secondary | ICD-10-CM | POA: Diagnosis not present

## 2019-05-24 DIAGNOSIS — Z48816 Encounter for surgical aftercare following surgery on the genitourinary system: Secondary | ICD-10-CM | POA: Diagnosis not present

## 2019-05-24 DIAGNOSIS — H9193 Unspecified hearing loss, bilateral: Secondary | ICD-10-CM | POA: Diagnosis not present

## 2019-05-24 DIAGNOSIS — I2510 Atherosclerotic heart disease of native coronary artery without angina pectoris: Secondary | ICD-10-CM | POA: Diagnosis not present

## 2019-05-24 DIAGNOSIS — G4700 Insomnia, unspecified: Secondary | ICD-10-CM | POA: Diagnosis not present

## 2019-05-24 DIAGNOSIS — N136 Pyonephrosis: Secondary | ICD-10-CM | POA: Diagnosis not present

## 2019-05-24 DIAGNOSIS — R338 Other retention of urine: Secondary | ICD-10-CM | POA: Diagnosis not present

## 2019-05-24 DIAGNOSIS — E559 Vitamin D deficiency, unspecified: Secondary | ICD-10-CM | POA: Diagnosis not present

## 2019-05-24 MED ORDER — ALPRAZOLAM 0.25 MG PO TABS: 0.2500 mg | ORAL_TABLET | Freq: Two times a day (BID) | ORAL | 0 refills | Status: AC | PRN

## 2019-05-24 NOTE — Progress Notes (Signed)
Spoke with niece by phone.  She says that patient has had worsening anxiety and depression.  He is fixating on symptoms.  She asked about starting alprazolam, which patient has had tolerated before.  Will give trial of alprazolam.

## 2019-05-28 ENCOUNTER — Telehealth: Payer: Self-pay | Admitting: Hospice and Palliative Medicine

## 2019-05-28 ENCOUNTER — Other Ambulatory Visit: Payer: Self-pay | Admitting: *Deleted

## 2019-05-28 ENCOUNTER — Encounter: Payer: Self-pay | Admitting: Nurse Practitioner

## 2019-05-28 DIAGNOSIS — D649 Anemia, unspecified: Secondary | ICD-10-CM | POA: Insufficient documentation

## 2019-05-28 DIAGNOSIS — G893 Neoplasm related pain (acute) (chronic): Secondary | ICD-10-CM | POA: Insufficient documentation

## 2019-05-28 DIAGNOSIS — C7951 Secondary malignant neoplasm of bone: Secondary | ICD-10-CM | POA: Insufficient documentation

## 2019-05-28 NOTE — Telephone Encounter (Signed)
I spoke with patient's niece by phone.  She reports that patient is sleeping a lot during the day.  He is mostly in his bed.  Oral intake is reportedly still good.  She says that symptomatically he is improved and requiring minimal pain medication.  I discussed holding his alprazolam and possibly discontinuing fentanyl.  I offered a clinic visit this week but niece is not sure that patient will want to come into the clinic.  She asked if patient is approaching end-of-life.  We discussed the option of hospice involvement.  She will speak with patient and family more regarding decision making.

## 2019-05-29 ENCOUNTER — Telehealth: Payer: Self-pay | Admitting: Hospice and Palliative Medicine

## 2019-05-29 NOTE — Telephone Encounter (Signed)
Spoke with patient and his niece and nephew by phone.  They have decided that they would like to pursue hospice at home and forego future work-up or treatment.  They would like to just focus on comfort.  Case discussed with Dr. Grayland Ormond, who agrees with hospice at home.  Plan: -Best supportive care -Hospice at home -Cancel PET scan

## 2019-05-30 ENCOUNTER — Ambulatory Visit: Payer: PPO | Admitting: Radiation Oncology

## 2019-06-01 ENCOUNTER — Ambulatory Visit: Payer: PPO | Admitting: Allergy

## 2019-06-01 ENCOUNTER — Other Ambulatory Visit: Payer: Self-pay

## 2019-06-01 ENCOUNTER — Telehealth: Payer: Self-pay | Admitting: *Deleted

## 2019-06-01 ENCOUNTER — Other Ambulatory Visit: Payer: Self-pay | Admitting: Hospice and Palliative Medicine

## 2019-06-01 MED ORDER — LORAZEPAM 0.5 MG PO TABS: 0.5000 mg | ORAL_TABLET | ORAL | 0 refills | Status: AC | PRN

## 2019-06-01 MED ORDER — HYOSCYAMINE SULFATE 0.125 MG PO TABS: 0.1250 mg | ORAL_TABLET | ORAL | 0 refills | Status: AC | PRN

## 2019-06-01 MED ORDER — MORPHINE SULFATE (CONCENTRATE) 10 MG /0.5 ML PO SOLN: 5.0000 mg | ORAL | 0 refills | Status: AC | PRN

## 2019-06-01 NOTE — Telephone Encounter (Addendum)
Ina with hospice called reporting that patient is transitioning and they need comfort medications in the home before the weekend. Asking for Roxanol and Lorazepam. Please send orders for this to [facility]

## 2019-06-01 NOTE — Progress Notes (Signed)
Spoke with hospice nurse. Patient actively declining. Will order meds available over the weekend if needed for comfort.

## 2019-06-01 NOTE — Telephone Encounter (Signed)
I spoke with hospice nurse by phone. Orders given and prescriptions sent.

## 2019-06-04 ENCOUNTER — Inpatient Hospital Stay: Payer: PPO | Attending: Hospice and Palliative Medicine | Admitting: Hospice and Palliative Medicine

## 2019-06-04 DIAGNOSIS — Z515 Encounter for palliative care: Secondary | ICD-10-CM

## 2019-06-04 DIAGNOSIS — C689 Malignant neoplasm of urinary organ, unspecified: Secondary | ICD-10-CM

## 2019-06-04 DIAGNOSIS — Z710 Person encountering health services to consult on behalf of another person: Secondary | ICD-10-CM

## 2019-06-04 NOTE — Progress Notes (Signed)
Virtual Visit via Telephone Note  I connected with Bryan Nelson on 06/04/19 at 11:00 AM EDT by telephone and verified that I am speaking with the correct person using two identifiers.   I discussed the limitations, risks, security and privacy concerns of performing an evaluation and management service by telephone and the availability of in person appointments. I also discussed with the patient that there may be a patient responsible charge related to this service. The patient expressed understanding and agreed to proceed.   History of Present Illness: Palliative Care consult requested for this 85 y.o. male with multiple medical problems including history of diffuse large B-cell lymphoma (initially diagnosed August 2015) who is status post 6 cycles of R-CHOP chemotherapy.  Abdominal CT scan February 01, 2019 revealed a 5 cm soft tissue mass along the left pelvic sidewall obstructing the left ureter.  Biopsy revealed high-grade urothelial carcinoma with squamous differentiation.  Patient was referred to palliative care to help address goals.   Observations/Objective: Called and spoke with patient's niece by phone.  Patient is reportedly somnolent and confused and was unable to dissipate in the conversation.  Niece reports that patient is rapidly declining.  He has not had any appreciable oral intake in several days.  He is mostly sleeping.  Family feel that he is comfortable and has not required dosing of comfort meds yet.  Assessment and Plan: Urothelial carcinoma -no plan for further treatment patient is on hospice.  Goals of care -family feel that patient is at end-of-life.  He does sound per description of family and hospice nurse that he is actively terminal.  Patient had communicated goal to family that he wanted to die at home and they are committed to allowing him that wish.  Hospice is following.  Emotional support provided to family.  All questions answered.  Follow Up Instructions: As  needed   I discussed the assessment and treatment plan with the patient. The patient was provided an opportunity to ask questions and all were answered. The patient agreed with the plan and demonstrated an understanding of the instructions.   The patient was advised to call back or seek an in-person evaluation if the symptoms worsen or if the condition fails to improve as anticipated.  I provided 8 minutes of non-face-to-face time during this encounter.   Irean Hong, NP

## 2019-06-13 ENCOUNTER — Ambulatory Visit: Payer: PPO | Admitting: Urology

## 2019-06-24 DEATH — deceased

## 2019-06-25 ENCOUNTER — Other Ambulatory Visit: Payer: PPO

## 2019-06-26 ENCOUNTER — Ambulatory Visit: Payer: PPO | Admitting: Oncology

## 2019-06-27 ENCOUNTER — Ambulatory Visit: Payer: PPO | Admitting: Radiation Oncology

## 2019-09-17 ENCOUNTER — Ambulatory Visit: Payer: PPO

## 2021-04-13 IMAGING — CT CT ABDOMEN AND PELVIS WITHOUT CONTRAST
2 of 4 series · 15 of 46 positions shown, 17 images · non-contrast
Comparison: CT scan dated 02/01/2019

CLINICAL DATA: Progressive left-sided abdominal pain. Invasive
high-grade urothelial carcinoma with squamous differentiation.

EXAM:
CT ABDOMEN AND PELVIS WITHOUT CONTRAST
TECHNIQUE: Multidetector CT imaging of the abdomen and pelvis was performed
following the standard protocol without IV contrast.

[Series 2: routine abd/pel wo · axial · 0.95mm/px · z∈[-1025,-550]mm · 12 of 105 slices shown, 14 images]
[im 5/105  soft-tissue]
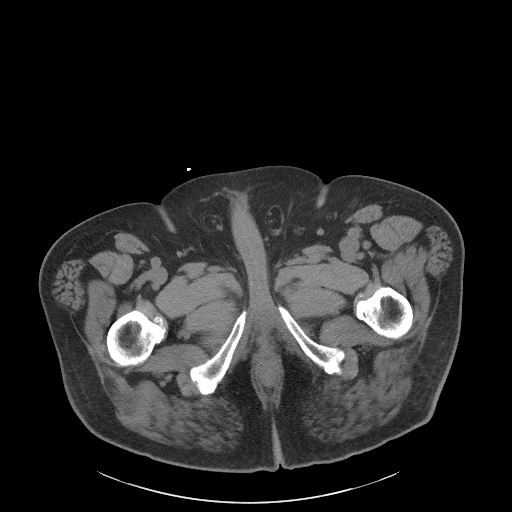
[im 5/105  bone]
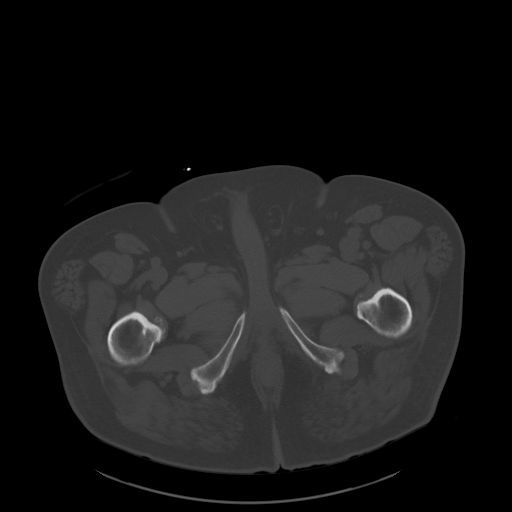
[im 14/105  soft-tissue]
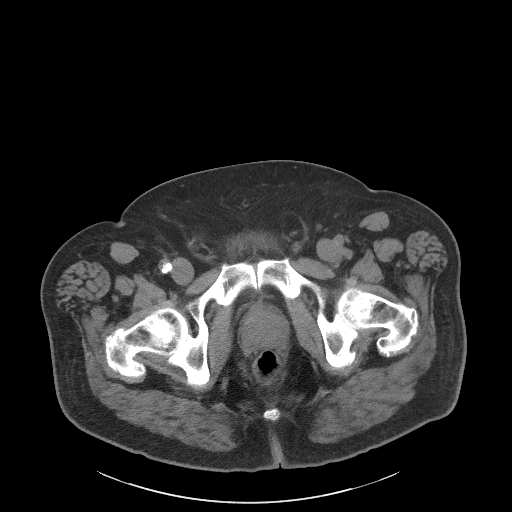
[im 22/105  soft-tissue]
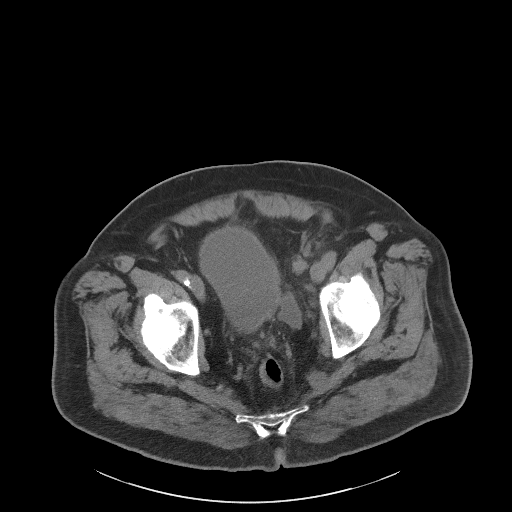
[im 31/105  soft-tissue]
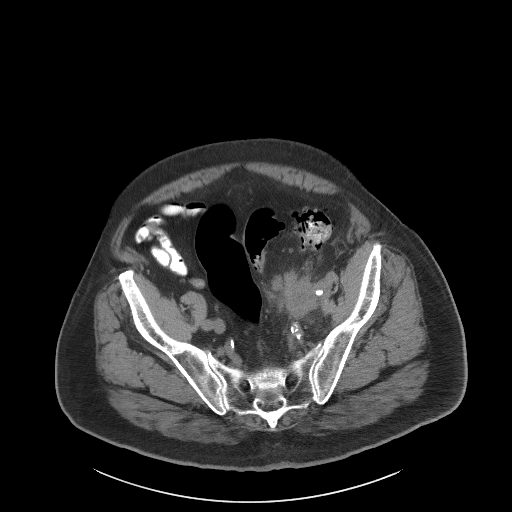
[im 40/105  soft-tissue]
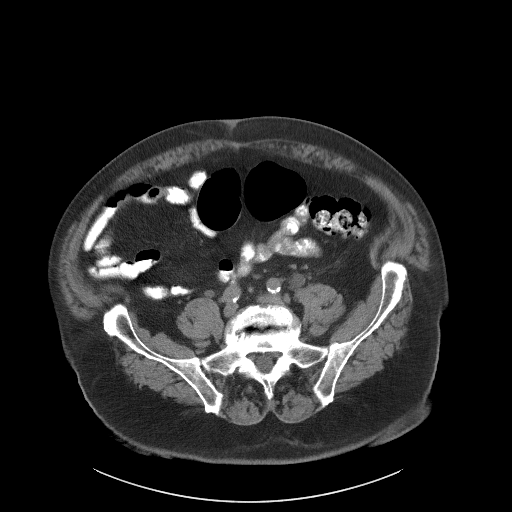
[im 48/105  soft-tissue]
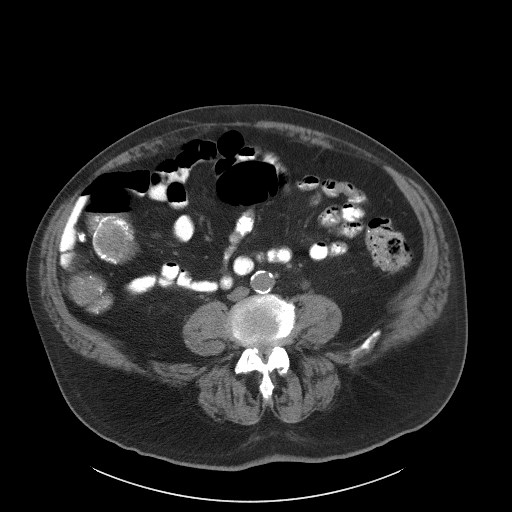
[im 57/105  soft-tissue]
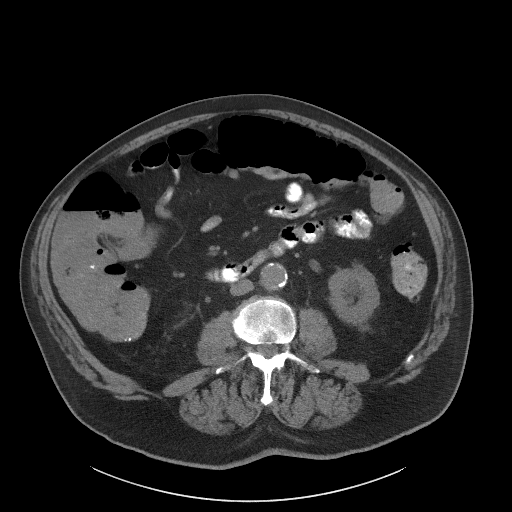
[im 66/105  soft-tissue]
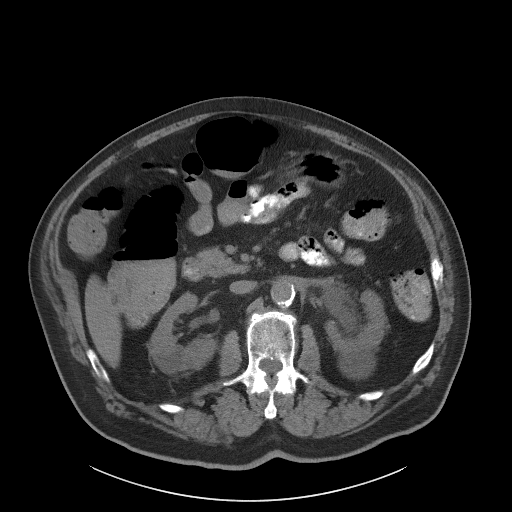
[im 74/105  soft-tissue]
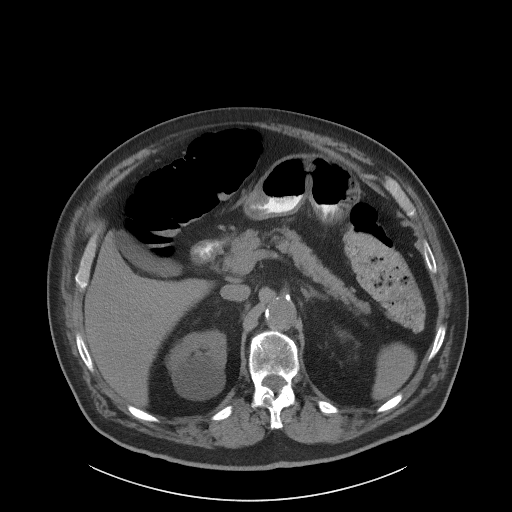
[im 74/105  bone]
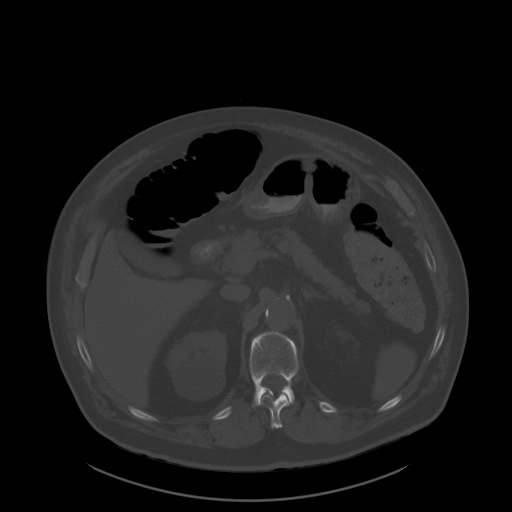
[im 83/105  soft-tissue]
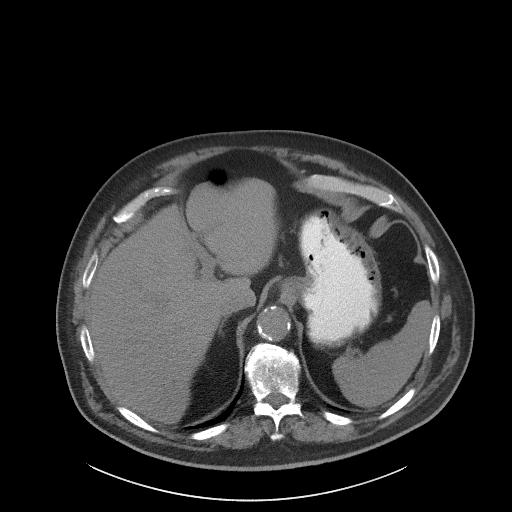
[im 92/105  soft-tissue]
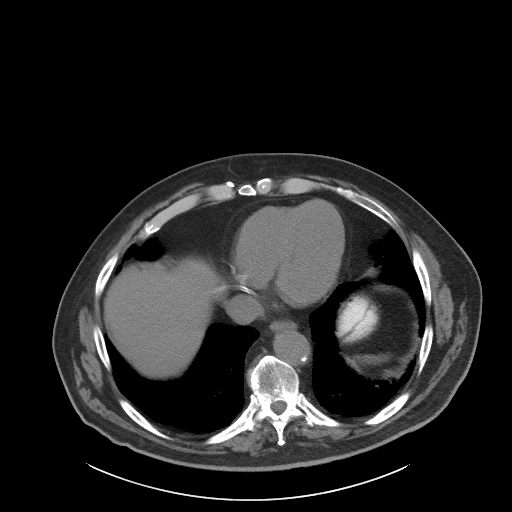
[im 100/105  soft-tissue]
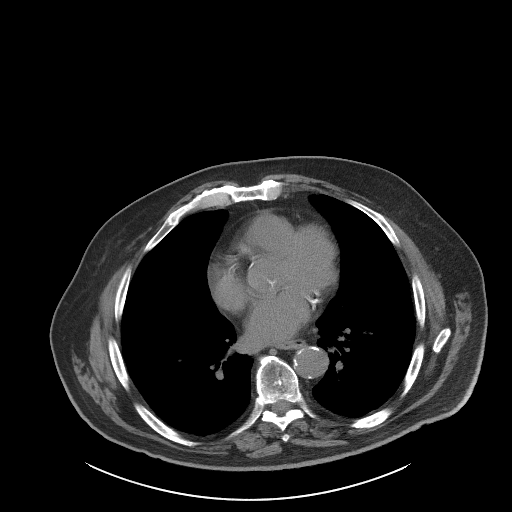

[Series 5: coronal st · coronal · 0.86mm/px · 3 of 122 slices shown]
[im 41/122  soft-tissue]
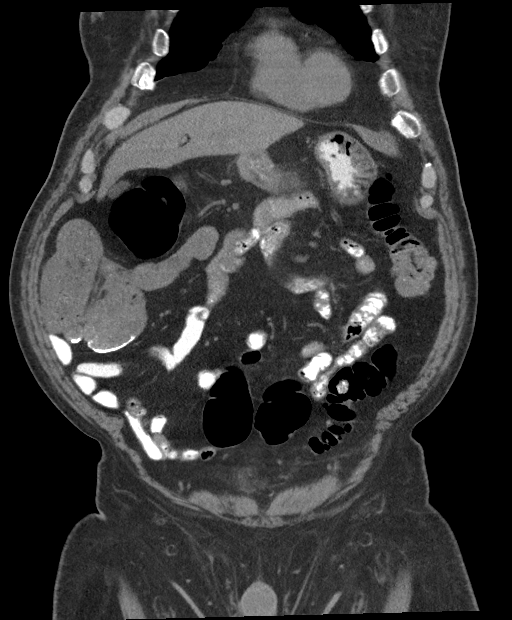
[im 54/122  soft-tissue]
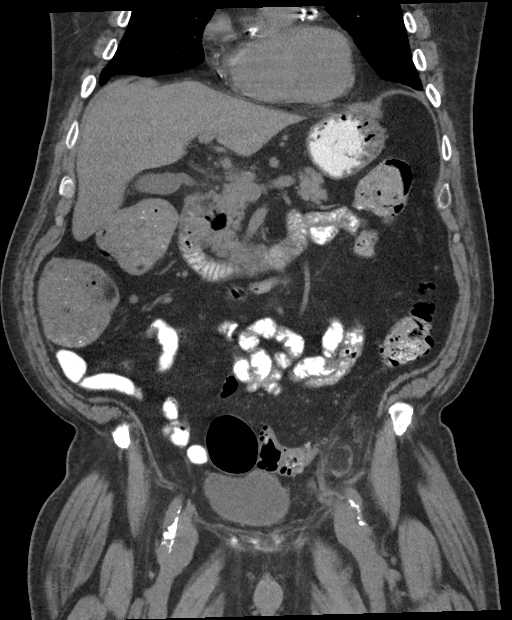
[im 68/122  soft-tissue]
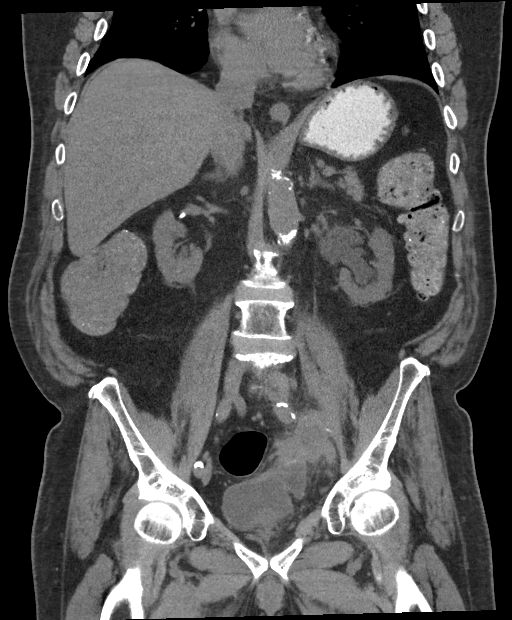

[15 of 46 positions shown; findings below may reference images not displayed]

FINDINGS: Lower chest: Aortic atherosclerosis. Extensive coronary artery
calcifications. Heart size is normal. Minimal atelectasis at the
left lung base. No effusions.

Hepatobiliary: No focal liver abnormality is seen. No gallstones,
gallbladder wall thickening, or biliary dilatation.

Pancreas: Unremarkable. No pancreatic ductal dilatation or
surrounding inflammatory changes.

Spleen: Normal in size without focal abnormality.

Adrenals/Urinary Tract: There has been progression of the irregular
mass in the left side of the pelvis which probably arises from the
superior aspect of a left-sided bladder diverticulum. The mass now
measures approximately 4.2 x 4.2 x 4.0 cm.

There is persistent left hydronephrosis due to distal ureteral
obstruction by the mass. This is unchanged. There is slight soft
tissue stranding from the mass to the left superolateral aspect of
the dome of the bladder, probably representing tumor, new since the
prior exam.

There is a new nodular extension of the mass anteriorly seen on
image 73 of series 2, 16 mm in diameter. There is new soft tissue
stranding extending to the sigmoid portion of the colon best seen on
images 76 and 77 of series 2.

The mass also extends into the left psoas muscle on image 71 of
series 2.

Stomach/Bowel: There are few diverticula in the sigmoid colon. Other
than the tumor stranding toward the sigmoid colon as described
above, the bowel appears normal. Appendix is normal.

Vascular/Lymphatic: Extensive aortic atherosclerosis.

Reproductive: Prostate is unremarkable.

Other: No free air or free fluid or abdominal wall hernia.

Musculoskeletal: No acute abnormalities. Multilevel degenerative
disc disease. Old anterior wedge deformity of T12.
IMPRESSION: 1. There has been progression of the irregular mass in the left side
of the pelvis which probably arises from the superior aspect of a
left-sided bladder diverticulum. The mass now measures approximately
4.2 x 4.2 x 4.0 cm. The mass now has soft tissue stranding to the
left psoas muscle, sigmoid portion of the colon, and the dome of the
bladder.
2. Persistent left hydronephrosis due to distal ureteral obstruction
by the mass.
3. Aortic atherosclerosis and coronary artery calcifications.
4. Sigmoid diverticulosis without diverticulitis.
5. Old anterior wedge deformity of T12.

Aortic Atherosclerosis ([id number]).
